# Patient Record
Sex: Female | Born: 1960 | Race: Black or African American | Hispanic: No | Marital: Single | Smoking: Never smoker
Health system: Southern US, Community
[De-identification: ages and names within clinical notes are randomized; demographics above are authoritative.]

## PROBLEM LIST (undated history)

## (undated) DIAGNOSIS — K7460 Unspecified cirrhosis of liver: Secondary | ICD-10-CM

## (undated) DIAGNOSIS — M79643 Pain in unspecified hand: Secondary | ICD-10-CM

## (undated) DIAGNOSIS — M549 Dorsalgia, unspecified: Secondary | ICD-10-CM

## (undated) DIAGNOSIS — M1990 Unspecified osteoarthritis, unspecified site: Secondary | ICD-10-CM

## (undated) DIAGNOSIS — F419 Anxiety disorder, unspecified: Secondary | ICD-10-CM

## (undated) DIAGNOSIS — E559 Vitamin D deficiency, unspecified: Secondary | ICD-10-CM

## (undated) DIAGNOSIS — E785 Hyperlipidemia, unspecified: Secondary | ICD-10-CM

## (undated) DIAGNOSIS — F988 Other specified behavioral and emotional disorders with onset usually occurring in childhood and adolescence: Secondary | ICD-10-CM

## (undated) DIAGNOSIS — K219 Gastro-esophageal reflux disease without esophagitis: Secondary | ICD-10-CM

## (undated) DIAGNOSIS — M2550 Pain in unspecified joint: Secondary | ICD-10-CM

## (undated) DIAGNOSIS — K769 Liver disease, unspecified: Secondary | ICD-10-CM

## (undated) DIAGNOSIS — B182 Chronic viral hepatitis C: Secondary | ICD-10-CM

## (undated) DIAGNOSIS — R5383 Other fatigue: Secondary | ICD-10-CM

## (undated) DIAGNOSIS — I10 Essential (primary) hypertension: Secondary | ICD-10-CM

## (undated) DIAGNOSIS — M79671 Pain in right foot: Secondary | ICD-10-CM

## (undated) DIAGNOSIS — F319 Bipolar disorder, unspecified: Secondary | ICD-10-CM

## (undated) DIAGNOSIS — F909 Attention-deficit hyperactivity disorder, unspecified type: Secondary | ICD-10-CM

## (undated) DIAGNOSIS — M109 Gout, unspecified: Secondary | ICD-10-CM

## (undated) DIAGNOSIS — K5730 Diverticulosis of large intestine without perforation or abscess without bleeding: Secondary | ICD-10-CM

## (undated) DIAGNOSIS — K279 Peptic ulcer, site unspecified, unspecified as acute or chronic, without hemorrhage or perforation: Secondary | ICD-10-CM

## (undated) DIAGNOSIS — M79672 Pain in left foot: Secondary | ICD-10-CM

## (undated) DIAGNOSIS — R0602 Shortness of breath: Secondary | ICD-10-CM

## (undated) HISTORY — DX: Diverticulosis of large intestine without perforation or abscess without bleeding: K57.30

## (undated) HISTORY — DX: Unspecified osteoarthritis, unspecified site: M19.90

## (undated) HISTORY — DX: Other specified behavioral and emotional disorders with onset usually occurring in childhood and adolescence: F98.8

## (undated) HISTORY — DX: Attention-deficit hyperactivity disorder, unspecified type: F90.9

## (undated) HISTORY — PX: BACK SURGERY: SHX140

## (undated) HISTORY — DX: Pain in unspecified hand: M79.643

## (undated) HISTORY — DX: Hyperlipidemia, unspecified: E78.5

## (undated) HISTORY — DX: Shortness of breath: R06.02

## (undated) HISTORY — DX: Peptic ulcer, site unspecified, unspecified as acute or chronic, without hemorrhage or perforation: K27.9

## (undated) HISTORY — DX: Liver disease, unspecified: K76.9

## (undated) HISTORY — DX: Pain in right foot: M79.671

## (undated) HISTORY — DX: Unspecified cirrhosis of liver: K74.60

## (undated) HISTORY — DX: Essential (primary) hypertension: I10

## (undated) HISTORY — DX: Vitamin D deficiency, unspecified: E55.9

## (undated) HISTORY — DX: Pain in unspecified joint: M25.50

## (undated) HISTORY — DX: Dorsalgia, unspecified: M54.9

## (undated) HISTORY — DX: Other fatigue: R53.83

## (undated) HISTORY — DX: Chronic viral hepatitis C: B18.2

## (undated) HISTORY — DX: Pain in left foot: M79.672

## (undated) HISTORY — DX: Bipolar disorder, unspecified: F31.9

---
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
## 2003-01-07 ENCOUNTER — Encounter: Payer: Self-pay | Admitting: *Deleted

## 2003-01-07 ENCOUNTER — Emergency Department (HOSPITAL_COMMUNITY): Admission: EM | Admit: 2003-01-07 | Discharge: 2003-01-07 | Payer: Self-pay | Admitting: *Deleted

## 2003-06-14 ENCOUNTER — Emergency Department (HOSPITAL_COMMUNITY): Admission: EM | Admit: 2003-06-14 | Discharge: 2003-06-14 | Payer: Self-pay | Admitting: Emergency Medicine

## 2003-12-05 ENCOUNTER — Emergency Department (HOSPITAL_COMMUNITY): Admission: EM | Admit: 2003-12-05 | Discharge: 2003-12-05 | Payer: Self-pay | Admitting: Emergency Medicine

## 2004-06-04 ENCOUNTER — Emergency Department (HOSPITAL_COMMUNITY): Admission: EM | Admit: 2004-06-04 | Discharge: 2004-06-04 | Payer: Self-pay | Admitting: Emergency Medicine

## 2007-03-31 ENCOUNTER — Emergency Department (HOSPITAL_COMMUNITY): Admission: EM | Admit: 2007-03-31 | Discharge: 2007-03-31 | Payer: Self-pay | Admitting: Family Medicine

## 2007-04-04 ENCOUNTER — Ambulatory Visit: Payer: Self-pay | Admitting: *Deleted

## 2007-11-05 ENCOUNTER — Emergency Department (HOSPITAL_COMMUNITY): Admission: EM | Admit: 2007-11-05 | Discharge: 2007-11-05 | Payer: Self-pay | Admitting: Emergency Medicine

## 2007-12-08 ENCOUNTER — Emergency Department (HOSPITAL_COMMUNITY): Admission: EM | Admit: 2007-12-08 | Discharge: 2007-12-08 | Payer: Self-pay | Admitting: Emergency Medicine

## 2009-12-08 ENCOUNTER — Emergency Department (HOSPITAL_COMMUNITY): Admission: EM | Admit: 2009-12-08 | Discharge: 2009-12-08 | Payer: Self-pay | Admitting: Emergency Medicine

## 2010-09-07 ENCOUNTER — Emergency Department (HOSPITAL_COMMUNITY)
Admission: EM | Admit: 2010-09-07 | Discharge: 2010-09-07 | Disposition: A | Discharge status: Home / Self Care | Payer: Self-pay | Attending: Emergency Medicine | Admitting: Emergency Medicine

## 2010-09-07 ENCOUNTER — Emergency Department (HOSPITAL_COMMUNITY): Payer: Self-pay

## 2010-09-07 DIAGNOSIS — S81009A Unspecified open wound, unspecified knee, initial encounter: Secondary | ICD-10-CM | POA: Insufficient documentation

## 2010-09-07 DIAGNOSIS — W540XXA Bitten by dog, initial encounter: Secondary | ICD-10-CM | POA: Insufficient documentation

## 2010-09-07 DIAGNOSIS — S51009A Unspecified open wound of unspecified elbow, initial encounter: Secondary | ICD-10-CM | POA: Insufficient documentation

## 2011-03-26 ENCOUNTER — Emergency Department (HOSPITAL_COMMUNITY)
Admission: EM | Admit: 2011-03-26 | Discharge: 2011-03-26 | Disposition: A | Discharge status: Home / Self Care | Payer: Self-pay | Attending: Emergency Medicine | Admitting: Emergency Medicine

## 2011-03-26 DIAGNOSIS — K089 Disorder of teeth and supporting structures, unspecified: Secondary | ICD-10-CM | POA: Insufficient documentation

## 2011-03-26 DIAGNOSIS — K029 Dental caries, unspecified: Secondary | ICD-10-CM | POA: Insufficient documentation

## 2011-03-28 ENCOUNTER — Emergency Department (HOSPITAL_COMMUNITY)
Admission: EM | Admit: 2011-03-28 | Discharge: 2011-03-28 | Disposition: A | Discharge status: Home / Self Care | Payer: Self-pay | Attending: Emergency Medicine | Admitting: Emergency Medicine

## 2011-03-28 ENCOUNTER — Encounter: Payer: Self-pay | Admitting: Emergency Medicine

## 2011-03-28 DIAGNOSIS — L988 Other specified disorders of the skin and subcutaneous tissue: Secondary | ICD-10-CM | POA: Insufficient documentation

## 2011-03-28 DIAGNOSIS — R220 Localized swelling, mass and lump, head: Secondary | ICD-10-CM | POA: Insufficient documentation

## 2011-03-28 DIAGNOSIS — K029 Dental caries, unspecified: Secondary | ICD-10-CM | POA: Insufficient documentation

## 2011-03-28 DIAGNOSIS — K089 Disorder of teeth and supporting structures, unspecified: Secondary | ICD-10-CM | POA: Insufficient documentation

## 2011-03-28 DIAGNOSIS — K047 Periapical abscess without sinus: Secondary | ICD-10-CM | POA: Insufficient documentation

## 2011-03-28 MED ORDER — IBUPROFEN 800 MG PO TABS: 800.0000 mg | ORAL_TABLET | Freq: Three times a day (TID) | ORAL | Status: AC

## 2011-03-28 MED ORDER — OXYCODONE-ACETAMINOPHEN 5-325 MG PO TABS
1.0000 | ORAL_TABLET | Freq: Once | ORAL | Status: AC
  Administered 2011-03-28: 1 via ORAL
  Filled 2011-03-28: qty 1

## 2011-03-28 MED ORDER — OXYCODONE-ACETAMINOPHEN 5-325 MG PO TABS: 2.0000 | ORAL_TABLET | ORAL | Status: AC | PRN

## 2011-03-28 MED ORDER — CLINDAMYCIN HCL 150 MG PO CAPS
450.0000 mg | ORAL_CAPSULE | Freq: Once | ORAL | Status: AC
  Administered 2011-03-28: 450 mg via ORAL
  Filled 2011-03-28: qty 3

## 2011-03-28 MED ORDER — CLINDAMYCIN HCL 150 MG PO CAPS: 300.0000 mg | ORAL_CAPSULE | Freq: Three times a day (TID) | ORAL | Status: AC

## 2011-03-28 MED ORDER — BUPIVACAINE HCL (PF) 0.5 % IJ SOLN
10.0000 mL | Freq: Once | INTRAMUSCULAR | Status: AC
  Administered 2011-03-28: 10 mL
  Filled 2011-03-28: qty 10

## 2011-03-28 NOTE — ED Provider Notes (Signed)
Medical screening examination/treatment/procedure(s) were performed by non-physician practitioner and as supervising physician I was immediately available for consultation/collaboration.  Nicholes Stairs, MD 03/28/11 [phone]

## 2011-03-28 NOTE — ED Notes (Signed)
Patient is resting comfortably. 

## 2011-03-28 NOTE — ED Notes (Signed)
Family at bedside. 

## 2011-03-28 NOTE — ED Notes (Signed)
Pt. Stated, tooth abscess for 2 months and just got worse

## 2011-03-28 NOTE — ED Provider Notes (Signed)
History     CSN: [id number] Arrival date & time: 03/28/2011  7:55 AM   First MD Initiated Contact with Patient 03/28/11 0756      No chief complaint on file.    Patient is a 50 y.o. female presenting with tooth pain. The history is provided by the patient.  Dental PainThe primary symptoms include mouth pain. Primary symptoms do not include dental injury, oral bleeding, oral lesions, headaches, fever, shortness of breath or sore throat. The symptoms began 3 to 5 days ago. The symptoms are worsening. The symptoms are recurrent. The symptoms occur constantly.  Additional symptoms include: dental sensitivity to temperature, gum swelling, gum tenderness, jaw pain and facial swelling. Additional symptoms do not include: purulent gums, trismus, trouble swallowing, dry mouth, taste disturbance, drooling, ear pain and swollen glands. Medical issues include: alcohol problem and periodontal disease.  She was seen here on 11/2 for the same; she was rxed PCN and analgesics. She has been taking them as instructed. The pain is no better and she has noted an abscess in the gums as well as swelling to the cheek. She denies trismus, trouble swallowing, trouble breathing.  No past medical history on file.  No past surgical history on file.  No family history on file.  History  Substance Use Topics  . Smoking status: Not on file  . Smokeless tobacco: Not on file  . Alcohol Use: Not on file    OB History    No data available      Review of Systems  Constitutional: Negative.  Negative for fever.  HENT: Positive for facial swelling. Negative for ear pain, sore throat, drooling, trouble swallowing and neck pain.   Respiratory: Negative.  Negative for shortness of breath.   Cardiovascular: Negative.   Skin: Positive for color change.  Neurological: Negative.  Negative for dizziness, speech difficulty, numbness and headaches.    Allergies  Penicillins  Home Medications  No current outpatient  prescriptions on file.  BP 147/95  Pulse 69  Temp(Src) 99.9 F (37.7 C) (Oral)  SpO2 98%  Physical Exam  Constitutional: She is oriented to person, place, and time. She appears well-developed and well-nourished. No distress.  HENT:  Head: Normocephalic. No trismus in the jaw.  Mouth/Throat: Uvula is midline, oropharynx is clear and moist and mucous membranes are normal. Abnormal dentition. Dental abscesses and dental caries present. No uvula swelling. No oropharyngeal exudate.         Swelling of L cheek, eyelid  Eyes: Conjunctivae and EOM are normal. Pupils are equal, round, and reactive to light.  Neck: Normal range of motion. Neck supple. No tracheal deviation present. No thyromegaly present.  Cardiovascular: Normal rate and regular rhythm.   Pulmonary/Chest: Effort normal and breath sounds normal. No stridor.  Lymphadenopathy:    She has no cervical adenopathy.  Neurological: She is alert and oriented to person, place, and time. No cranial nerve deficit.  Skin: Skin is warm and dry. No rash noted. She is not diaphoretic.  Psychiatric: She has a normal mood and affect.    ED Course  INCISION AND DRAINAGE Date/Time: 03/28/2011 9:00 AM Performed by: Grant Fontana Authorized by: Grant Fontana Consent: Verbal consent obtained. Risks and benefits: risks, benefits and alternatives were discussed Consent given by: patient Patient identity confirmed: verbally with patient and arm band Time out: Immediately prior to procedure a "time out" was called to verify the correct patient, procedure, equipment, support staff and site/side marked as required. Type: abscess Body area:  mouth Location details: palate Anesthesia: local infiltration Local anesthetic: bupivacaine 0.5% without epinephrine Anesthetic total: 2 ml Patient sedated: no Risk factor: coagulopathy, underlying major nerve and underlying major vessel Scalpel size: 11 Needle gauge: 18  Attempt was made to  infiltrate the area with Marcaine and needle aspirate the area with 18ga needle, but no purulent drainage was produced. Labs Reviewed - No data to display No results found.   1. Dental abscess       MDM  We discussed having the tooth pulled; patient stated that she was trying to wait for the free clinic that's coming up in 2 weeks. I told her that given her facial swelling, it's very important for her to get the tooth pulled and follow up with a dentist in the next day or 2. She was given a handout of free/low cost dental services in the area and instructed on followup. She verbalized understanding and agreed to plan.        Grant Fontana, [place] 03/28/11 [phone]

## 2011-03-31 NOTE — ED Provider Notes (Signed)
Medical screening examination/treatment/procedure(s) were performed by non-physician practitioner and as supervising physician I was immediately available for consultation/collaboration.  Nicholes Stairs, MD 03/31/11 1537

## 2013-03-02 ENCOUNTER — Ambulatory Visit (HOSPITAL_COMMUNITY)
Admission: RE | Admit: 2013-03-02 | Discharge: 2013-03-02 | Disposition: A | Discharge status: Home / Self Care | Payer: Self-pay | Source: Ambulatory Visit | Attending: Nurse Practitioner | Admitting: Nurse Practitioner

## 2013-03-02 ENCOUNTER — Other Ambulatory Visit (HOSPITAL_COMMUNITY): Payer: Self-pay | Admitting: Nurse Practitioner

## 2013-03-02 DIAGNOSIS — M5137 Other intervertebral disc degeneration, lumbosacral region: Secondary | ICD-10-CM | POA: Insufficient documentation

## 2013-03-02 DIAGNOSIS — M4310 Spondylolisthesis, site unspecified: Secondary | ICD-10-CM | POA: Insufficient documentation

## 2013-03-02 DIAGNOSIS — R52 Pain, unspecified: Secondary | ICD-10-CM

## 2013-03-02 DIAGNOSIS — M51379 Other intervertebral disc degeneration, lumbosacral region without mention of lumbar back pain or lower extremity pain: Secondary | ICD-10-CM | POA: Insufficient documentation

## 2013-09-21 ENCOUNTER — Ambulatory Visit: Payer: No Typology Code available for payment source | Attending: Internal Medicine | Admitting: Internal Medicine

## 2013-09-21 ENCOUNTER — Encounter: Payer: Self-pay | Admitting: Internal Medicine

## 2013-09-21 ENCOUNTER — Emergency Department (HOSPITAL_COMMUNITY)
Admission: EM | Admit: 2013-09-21 | Discharge: 2013-09-21 | Disposition: A | Discharge status: Home / Self Care | Payer: No Typology Code available for payment source | Source: Home / Self Care

## 2013-09-21 ENCOUNTER — Encounter (HOSPITAL_COMMUNITY): Payer: Self-pay | Admitting: Emergency Medicine

## 2013-09-21 VITALS — BP 174/79 | HR 66 | Temp 98.7°F | Resp 18 | Ht 61.0 in | Wt 148.0 lb

## 2013-09-21 DIAGNOSIS — K089 Disorder of teeth and supporting structures, unspecified: Secondary | ICD-10-CM

## 2013-09-21 DIAGNOSIS — G8929 Other chronic pain: Secondary | ICD-10-CM | POA: Insufficient documentation

## 2013-09-21 DIAGNOSIS — Z7189 Other specified counseling: Secondary | ICD-10-CM

## 2013-09-21 DIAGNOSIS — K0889 Other specified disorders of teeth and supporting structures: Secondary | ICD-10-CM

## 2013-09-21 DIAGNOSIS — M5417 Radiculopathy, lumbosacral region: Secondary | ICD-10-CM

## 2013-09-21 DIAGNOSIS — R29898 Other symptoms and signs involving the musculoskeletal system: Secondary | ICD-10-CM

## 2013-09-21 DIAGNOSIS — M4310 Spondylolisthesis, site unspecified: Secondary | ICD-10-CM

## 2013-09-21 DIAGNOSIS — I10 Essential (primary) hypertension: Secondary | ICD-10-CM | POA: Insufficient documentation

## 2013-09-21 DIAGNOSIS — Q762 Congenital spondylolisthesis: Secondary | ICD-10-CM

## 2013-09-21 DIAGNOSIS — IMO0002 Reserved for concepts with insufficient information to code with codable children: Secondary | ICD-10-CM | POA: Insufficient documentation

## 2013-09-21 DIAGNOSIS — M539 Dorsopathy, unspecified: Secondary | ICD-10-CM

## 2013-09-21 DIAGNOSIS — M545 Low back pain, unspecified: Secondary | ICD-10-CM

## 2013-09-21 DIAGNOSIS — F1010 Alcohol abuse, uncomplicated: Secondary | ICD-10-CM | POA: Insufficient documentation

## 2013-09-21 DIAGNOSIS — M6281 Muscle weakness (generalized): Secondary | ICD-10-CM

## 2013-09-21 DIAGNOSIS — Z79899 Other long term (current) drug therapy: Secondary | ICD-10-CM | POA: Insufficient documentation

## 2013-09-21 DIAGNOSIS — F17200 Nicotine dependence, unspecified, uncomplicated: Secondary | ICD-10-CM

## 2013-09-21 DIAGNOSIS — Z880 Allergy status to penicillin: Secondary | ICD-10-CM | POA: Insufficient documentation

## 2013-09-21 DIAGNOSIS — Z791 Long term (current) use of non-steroidal anti-inflammatories (NSAID): Secondary | ICD-10-CM | POA: Insufficient documentation

## 2013-09-21 DIAGNOSIS — Z7689 Persons encountering health services in other specified circumstances: Secondary | ICD-10-CM

## 2013-09-21 MED ORDER — CEPHALEXIN 500 MG PO CAPS: 500.0000 mg | ORAL_CAPSULE | Freq: Four times a day (QID) | ORAL | Status: DC

## 2013-09-21 MED ORDER — METHYLPREDNISOLONE 4 MG PO KIT: PACK | ORAL | Status: DC

## 2013-09-21 MED ORDER — HYDROCODONE-ACETAMINOPHEN 7.5-325 MG PO TABS: 1.0000 | ORAL_TABLET | ORAL | Status: DC | PRN

## 2013-09-21 NOTE — Discharge Instructions (Signed)
Chronic Back Pain  When back pain lasts longer than 3 months, it is called chronic back pain.People with chronic back pain often go through certain periods that are more intense (flare-ups).  CAUSES Chronic back pain can be caused by wear and tear (degeneration) on different structures in your back. These structures include:  The bones of your spine (vertebrae) and the joints surrounding your spinal cord and nerve roots (facets).  The strong, fibrous tissues that connect your vertebrae (ligaments). Degeneration of these structures may result in pressure on your nerves. This can lead to constant pain. HOME CARE INSTRUCTIONS  Avoid bending, heavy lifting, prolonged sitting, and activities which make the problem worse.  Take brief periods of rest throughout the day to reduce your pain. Lying down or standing usually is better than sitting while you are resting.  Take over-the-counter or prescription medicines only as directed by your caregiver. SEEK IMMEDIATE MEDICAL CARE IF:   You have weakness or numbness in one of your legs or feet.  You have trouble controlling your bladder or bowels.  You have nausea, vomiting, abdominal pain, shortness of breath, or fainting. Document Released: 06/17/2004 Document Revised: 08/02/2011 Document Reviewed: 04/24/2011 [facility] Patient Information 2014 [place]Care, [place].  Dental Pain Toothache is pain in or around a tooth. It may get worse with chewing or with cold or heat.  HOME CARE  Your dentist may use a numbing medicine during treatment. If so, you may need to avoid eating until the medicine wears off. Ask your dentist about this.  Only take medicine as told by your dentist or doctor.  Avoid chewing food near the painful tooth until after all treatment is done. Ask your dentist about this. GET HELP RIGHT AWAY IF:   The problem gets worse or new problems appear.  You have a fever.  There is redness and puffiness (swelling) of the face, jaw,  or neck.  You cannot open your mouth.  There is pain in the jaw.  There is very bad pain that is not helped by medicine. MAKE SURE YOU:   Understand these instructions.  Will watch your condition.  Will get help right away if you are not doing well or get worse. Document Released: 10/27/2007 Document Revised: 08/02/2011 Document Reviewed: 10/27/2007 [facility] Patient Information 2014 [place]Care, [place].  Lumbosacral Radiculopathy Lumbosacral radiculopathy is a pinched nerve or nerves in the low back (lumbosacral area). When this happens you may have weakness in your legs and may not be able to stand on your toes. You may have pain going down into your legs. There may be difficulties with walking normally. There are many causes of this problem. Sometimes this may happen from an injury, or simply from arthritis or boney problems. It may also be caused by other illnesses such as diabetes. If there is no improvement after treatment, further studies may be done to find the exact cause. DIAGNOSIS  X-rays may be needed if the problems become long standing. Electromyograms may be done. This study is one in which the working of nerves and muscles is studied. HOME CARE INSTRUCTIONS   Applications of ice packs may be helpful. Ice can be used in a plastic bag with a towel around it to prevent frostbite to skin. This may be used every 2 hours for 20 to 30 minutes, or as needed, while awake, or as directed by your caregiver.  Only take over-the-counter or prescription medicines for pain, discomfort, or fever as directed by your caregiver.  If physical therapy  was prescribed, follow your caregiver's directions. SEEK IMMEDIATE MEDICAL CARE IF:   You have pain not controlled with medications.  You seem to be getting worse rather than better.  You develop increasing weakness in your legs.  You develop loss of bowel or bladder control.  You have difficulty with walking or balance, or develop  clumsiness in the use of your legs.  You have a fever. MAKE SURE YOU:   Understand these instructions.  Will watch your condition.  Will get help right away if you are not doing well or get worse. Document Released: 05/10/2005 Document Revised: 08/02/2011 Document Reviewed: 12/29/2007 [facility] Patient Information 2014 [place]ExitCare, [place].  Radicular Pain Radicular pain in either the arm or leg is usually from a bulging or herniated disk in the spine. A piece of the herniated disk may press against the nerves as the nerves exit the spine. This causes pain which is felt at the tips of the nerves down the arm or leg. Other causes of radicular pain may include:  Fractures.  Heart disease.  Cancer.  An abnormal and usually degenerative state of the nervous system or nerves (neuropathy). Diagnosis may require CT or MRI scanning to determine the primary cause.  Nerves that start at the neck (nerve roots) may cause radicular pain in the outer shoulder and arm. It can spread down to the thumb and fingers. The symptoms vary depending on which nerve root has been affected. In most cases radicular pain improves with conservative treatment. Neck problems may require physical therapy, a neck collar, or cervical traction. Treatment may take many weeks, and surgery may be considered if the symptoms do not improve.  Conservative treatment is also recommended for sciatica. Sciatica causes pain to radiate from the lower back or buttock area down the leg into the foot. Often there is a history of back problems. Most patients with sciatica are better after 2 to 4 weeks of rest and other supportive care. Short term bed rest can reduce the disk pressure considerably. Sitting, however, is not a good position since this increases the pressure on the disk. You should avoid bending, lifting, and all other activities which make the problem worse. Traction can be used in severe cases. Surgery is usually reserved for  patients who do not improve within the first months of treatment. Only take over-the-counter or prescription medicines for pain, discomfort, or fever as directed by your caregiver. Narcotics and muscle relaxants may help by relieving more severe pain and spasm and by providing mild sedation. Cold or massage can give significant relief. Spinal manipulation is not recommended. It can increase the degree of disc protrusion. Epidural steroid injections are often effective treatment for radicular pain. These injections deliver medicine to the spinal nerve in the space between the protective covering of the spinal cord and back bones (vertebrae). Your caregiver can give you more information about steroid injections. These injections are most effective when given within two weeks of the onset of pain.  You should see your caregiver for follow up care as recommended. A program for neck and back injury rehabilitation with stretching and strengthening exercises is an important part of management.  SEEK IMMEDIATE MEDICAL CARE IF:  You develop increased pain, weakness, or numbness in your arm or leg.  You develop difficulty with bladder or bowel control.  You develop abdominal pain. Document Released: 06/17/2004 Document Revised: 08/02/2011 Document Reviewed: 09/02/2008 [facility]ExitCare Patient Information 2014 [place]ExitCare, [place]LLC.

## 2013-09-21 NOTE — ED Provider Notes (Signed)
CSN: [id number]     Arrival date & time 09/21/13  1040 History   First MD Initiated Contact with Patient 09/21/13 1116     Chief Complaint  Patient presents with  . Arm Pain  . Leg Pain   (Consider location/radiation/quality/duration/timing/severity/associated sxs/prior Treatment) HPI Comments: 53 year old female complaining of chronic low back pain with radiculopathy to the right lower extremity. This results from a L. 4/5 and currently states his. There is pain radiating to the right posterior hip and leg described as sharp and exacerbated by certain movements. Denies recent trauma. St was told several yrs ago she needed surgery. Her Xray taken last yr confirms her lumbar anterolisthesis and severe DDD.  Her second complaint is related to a right long finger with limited range of motion and strength due to a laceration of the web space. This occurred approximately one month ago in which she was intoxicated on her birthday. She did not seek medical treatment until today.  Surgical complaint is that of acute on chronic tooth pain in the left upper pre-and third molars. She is requesting a prescription for Keflex refill.   History reviewed. No pertinent past medical history. History reviewed. No pertinent past surgical history. History reviewed. No pertinent family history. History  Substance Use Topics  . Smoking status: Current Every Day Smoker  . Smokeless tobacco: Not on file  . Alcohol Use: 0.6 oz/week    1 Shots of liquor per week   OB History   Grav Para Term Preterm Abortions TAB SAB Ect Mult Living                 Review of Systems  Constitutional: Positive for activity change. Negative for fever and appetite change.  HENT: Positive for dental problem.   Respiratory: Negative.   Gastrointestinal: Negative.   Genitourinary: Negative.   Musculoskeletal: Positive for back pain. Negative for gait problem, joint swelling and neck pain.  Skin: Negative.   Neurological:  Negative for dizziness, seizures, syncope, speech difficulty, light-headedness and headaches.    Allergies  Penicillins  Home Medications   Prior to Admission medications   Medication Sig Start Date End Date Taking? Authorizing Provider  LISINOPRIL PO Take by mouth.   Yes Historical Provider, MD  ibuprofen (ADVIL,MOTRIN) 800 MG tablet Take 800 mg by mouth 3 (three) times daily with meals.      Historical Provider, MD  oxyCODONE-acetaminophen (PERCOCET) 5-325 MG per tablet Take 2 tablets by mouth every 3 (three) hours as needed. For pain.     Historical Provider, MD  penicillin v potassium (VEETID) 500 MG tablet Take 500 mg by mouth 3 (three) times daily. Pt started on Friday 11/3. Pt has only had 1 on 11/4. For 10 days.     Historical Provider, MD   BP 181/96  Pulse 70  Temp(Src) 98.4 F (36.9 C) (Oral)  Resp 16  SpO2 98%  LMP 08/22/2013 Physical Exam  Nursing note and vitals reviewed. Constitutional: She is oriented to person, place, and time. She appears well-developed and well-nourished. No distress.  Eyes: Conjunctivae and EOM are normal.  Neck: Normal range of motion. Neck supple.  Cardiovascular: Normal rate.   Pulmonary/Chest: Effort normal. No respiratory distress.  Musculoskeletal: She exhibits no edema.  Tenderness to the lower lumbar spine.   R Hand with weak flexion of middle digit and tenderness along the associated tendon. Decrease sensation to light touch of the long finger. Old laceration is healed. No signs of infection. No swelling or erythema.  Neurological: She is alert and oriented to person, place, and time. She exhibits normal muscle tone.  Skin: Skin is warm and dry. No rash noted.  Psychiatric: She has a normal mood and affect.    ED Course  Procedures (including critical care time) Labs Review Labs Reviewed - No data to display  Imaging Review No results found.   MDM   1. Acute low back pain due to spinal disorder   2. Anterolisthesis   3.  Lumbosacral radiculopathy at L4   4. Right hand weakness   5. Late effect of tendon injury   6. Toothache      norco 7.5 mg #15 Medrol dose pack Keflex Must f/u with PCP, hand surgeon and back specialist Pt made aware we cant tx chronic pain or the etiologies that will require specialist as above.      Hayden Rasmussenavid Laquana Villari, NP 09/21/13 1228

## 2013-09-21 NOTE — ED Notes (Signed)
C/o  Right leg pain.  Sharp pain the comes and goes usually last 5 to 10 min.   Also having weakness and pain in right hand.  Unable to grip or open drinks.  Having no relief with prescribed Rx.  Hx of sciatica.

## 2013-09-21 NOTE — Progress Notes (Signed)
Patient ID: Rachel Hudson, female   DOB: 11/08/1960, 53 y.o.   MRN: [id number]  CC: establish care, chronic back pain, HTN   HPI:  Patient presents today with complaints of chronic back pain.  Patient states that she was seen at Urgent care earlier today and wanted to come here to see if we could give her different care.  Patient wants a referral to orthopedics or surgeon to help with the chronic back pain.  She states she has had this same pain for the past few years.  The pain is relieved by nothing and aggravated by prolonged sitting and standing.  Patient reports shooting pain up her hip and back with twisting motions. She denies any bowel or bladder dysfunction. Patient reports that she often gets headaches related to high blood pressure and admits that she sometimes skips her medication because she wants to drink alcohol. Patient states that she has not consumed any alcohol in the past three weeks.    Allergies  Allergen Reactions  . Penicillins     Facial swelling   History reviewed. No pertinent past medical history. Current Outpatient Prescriptions on File Prior to Visit  Medication Sig Dispense Refill  . cephALEXin (KEFLEX) 500 MG capsule Take 1 capsule (500 mg total) by mouth 4 (four) times daily.  28 capsule  0  . HYDROcodone-acetaminophen (NORCO) 7.5-325 MG per tablet Take 1 tablet by mouth every 4 (four) hours as needed.  15 tablet  0  . ibuprofen (ADVIL,MOTRIN) 800 MG tablet Take 800 mg by mouth 3 (three) times daily with meals.        Marland Kitchen. LISINOPRIL PO Take by mouth.      . methylPREDNISolone (MEDROL DOSEPAK) 4 MG tablet follow package directions  21 tablet  0  . penicillin v potassium (VEETID) 500 MG tablet Take 500 mg by mouth 3 (three) times daily. Pt started on Friday 11/3. Pt has only had 1 on 11/4. For 10 days.        No current facility-administered medications on file prior to visit.   History reviewed. No pertinent family history. History   Social History  . Marital  Status: Single    Spouse Name: N/A    Number of Children: N/A  . Years of Education: N/A   Occupational History  . Not on file.   Social History Main Topics  . Smoking status: Current Every Day Smoker  . Smokeless tobacco: Not on file  . Alcohol Use: 0.6 oz/week    1 Shots of liquor per week  . Drug Use: 3.00 per week    Special: Marijuana  . Sexual Activity: Yes    Birth Control/ Protection: None   Other Topics Concern  . Not on file   Social History Narrative  . No narrative on file    Review of Systems: Constitutional: Negative for fever, chills, diaphoresis, activity change, appetite change and fatigue. HENT: Negative for ear pain, nosebleeds, congestion, facial swelling, rhinorrhea, neck pain, neck stiffness and ear discharge.  Eyes: Negative for pain, discharge, redness, itching and visual disturbance. Respiratory: Negative for cough, choking, chest tightness, shortness of breath, wheezing and stridor.  Cardiovascular: Negative for chest pain, palpitations and leg swelling. Gastrointestinal: Negative for abdominal distention. Genitourinary: Negative for dysuria, urgency, frequency, hematuria, flank pain, decreased urine volume, difficulty urinating and dyspareunia.  Musculoskeletal: Negative for joint swelling, arthralgias and gait problem. Positive for chronic back pain.  Neurological: Negative for dizziness, tremors, seizures, syncope, facial asymmetry, speech difficulty, weakness, light-headedness,  numbness. Positive for headaches.  Hematological: Negative for adenopathy. Does not bruise/bleed easily. Psychiatric/Behavioral: Negative for hallucinations, behavioral problems, confusion, dysphoric mood, decreased concentration and agitation.    Objective:   Filed Vitals:   09/21/13 1654  BP: 174/79  Pulse: 66  Temp: 98.7 F (37.1 C)  Resp: 18    Physical Exam: Constitutional: Patient appears well-developed and well-nourished. No distress. HENT: Normocephalic,  atraumatic, External right and left ear normal. Oropharynx is clear and moist.  Eyes: Conjunctivae and EOM are normal. PERRLA, no scleral icterus. Neck: Normal ROM. Neck supple. No JVD. No tracheal deviation. No thyromegaly. CVS: RRR, S1/S2 +, no murmurs, no gallops, no carotid bruit.  Pulmonary: Effort and breath sounds normal, no stridor, rhonchi, wheezes, rales.  Abdominal: Soft. BS +,  no distension, tenderness, rebound or guarding.  Musculoskeletal: Positive right straight leg raise. Tenderness to palpitation of lumbar spine. No edema. Lumbar pain elicited with ROM. Lymphadenopathy: No lymphadenopathy noted, cervical, Neuro: Alert. Normal reflexes, muscle tone coordination. No cranial nerve deficit. Skin: Skin is warm and dry. No rash noted. Not diaphoretic. No erythema. No pallor. Psychiatric: Normal mood and affect. Behavior, judgment, thought content normal.  No results found for this basename: WBC, HGB, HCT, MCV, PLT   No results found for this basename: CREATININE, BUN, NA, K, CL, CO2    No results found for this basename: HGBA1C   Lipid Panel  No results found for this basename: chol, trig, hdl, cholhdl, vldl, ldlcalc       Assessment and plan:   Rachel Hudson was seen today for establish care, hypertension and back pain.  Diagnoses and associated orders for this visit:  Lumbosacral radiculopathy at L4 - Ambulatory referral to Pain Clinic - Ambulatory referral to Orthopedic Surgery Continue medication regimen prescribed by Urgent care (vicodin and medrol dose pack)  HTN (hypertension) Follow up in one week to evaluate BP before making changes.   Smoking Patient reports that she is not ready to quit but will consider cutting back. Smoking cessation discussed with patient and how it affects cardiovascular health  Alcohol abuse Explained effects of etoh on BP. Discussed cutting back.   Return in about 1 week (around 09/28/2013), or if symptoms worsen or fail to improve,  for Nurse Visit-BP check, Lab Visit.   Holland CommonsValerie Dinesha Twiggs, NP-C [facility] [phone] 09/21/2013, 7:12 PM

## 2013-09-21 NOTE — Progress Notes (Signed)
Pt here to establish care for uncontrolled hypertension and Chronic right lumbar pain radiating down to foot. Pt had multiple imaging done with pain management Pt was just seen in Urgent care with prescribed Steroid dose pack and Hydrocodone BP elevated 174/89 66 slight headache noted- states she took bp med this am

## 2013-09-21 NOTE — Patient Instructions (Signed)
Sciatica Sciatica is pain, weakness, numbness, or tingling along the path of the sciatic nerve. The nerve starts in the lower back and runs down the back of each leg. The nerve controls the muscles in the lower leg and in the back of the knee, while also providing sensation to the back of the thigh, lower leg, and the sole of your foot. Sciatica is a symptom of another medical condition. For instance, nerve damage or certain conditions, such as a herniated disk or bone spur on the spine, pinch or put pressure on the sciatic nerve. This causes the pain, weakness, or other sensations normally associated with sciatica. Generally, sciatica only affects one side of the body. CAUSES   Herniated or slipped disc.  Degenerative disk disease.  A pain disorder involving the narrow muscle in the buttocks (piriformis syndrome).  Pelvic injury or fracture.  Pregnancy.  Tumor (rare). SYMPTOMS  Symptoms can vary from mild to very severe. The symptoms usually travel from the low back to the buttocks and down the back of the leg. Symptoms can include:  Mild tingling or dull aches in the lower back, leg, or hip.  Numbness in the back of the calf or sole of the foot.  Burning sensations in the lower back, leg, or hip.  Sharp pains in the lower back, leg, or hip.  Leg weakness.  Severe back pain inhibiting movement. These symptoms may get worse with coughing, sneezing, laughing, or prolonged sitting or standing. Also, being overweight may worsen symptoms. DIAGNOSIS  Your caregiver will perform a physical exam to look for common symptoms of sciatica. He or she may ask you to do certain movements or activities that would trigger sciatic nerve pain. Other tests may be performed to find the cause of the sciatica. These may include:  Blood tests.  X-rays.  Imaging tests, such as an MRI or CT scan. TREATMENT  Treatment is directed at the cause of the sciatic pain. Sometimes, treatment is not necessary  and the pain and discomfort goes away on its own. If treatment is needed, your caregiver may suggest:  Over-the-counter medicines to relieve pain.  Prescription medicines, such as anti-inflammatory medicine, muscle relaxants, or narcotics.  Applying heat or ice to the painful area.  Steroid injections to lessen pain, irritation, and inflammation around the nerve.  Reducing activity during periods of pain.  Exercising and stretching to strengthen your abdomen and improve flexibility of your spine. Your caregiver may suggest losing weight if the extra weight makes the back pain worse.  Physical therapy.  Surgery to eliminate what is pressing or pinching the nerve, such as a bone spur or part of a herniated disk. HOME CARE INSTRUCTIONS   Only take over-the-counter or prescription medicines for pain or discomfort as directed by your caregiver.  Apply ice to the affected area for 20 minutes, 3 4 times a day for the first 48 72 hours. Then try heat in the same way.  Exercise, stretch, or perform your usual activities if these do not aggravate your pain.  Attend physical therapy sessions as directed by your caregiver.  Keep all follow-up appointments as directed by your caregiver.  Do not wear high heels or shoes that do not provide proper support.  Check your mattress to see if it is too soft. A firm mattress may lessen your pain and discomfort. SEEK IMMEDIATE MEDICAL CARE IF:   You lose control of your bowel or bladder (incontinence).  You have increasing weakness in the lower back,   pelvis, buttocks, or legs.  You have redness or swelling of your back.  You have a burning sensation when you urinate.  You have pain that gets worse when you lie down or awakens you at night.  Your pain is worse than you have experienced in the past.  Your pain is lasting longer than 4 weeks.  You are suddenly losing weight without reason. MAKE SURE YOU:  Understand these  instructions.  Will watch your condition.  Will get help right away if you are not doing well or get worse. Document Released: 05/04/2001 Document Revised: 11/09/2011 Document Reviewed: 09/19/2011 [facility] Patient Information 2014 [place], [place]. Lumbosacral Radiculopathy Lumbosacral radiculopathy is a pinched nerve or nerves in the low back (lumbosacral area). When this happens you may have weakness in your legs and may not be able to stand on your toes. You may have pain going down into your legs. There may be difficulties with walking normally. There are many causes of this problem. Sometimes this may happen from an injury, or simply from arthritis or boney problems. It may also be caused by other illnesses such as diabetes. If there is no improvement after treatment, further studies may be done to find the exact cause. DIAGNOSIS  X-rays may be needed if the problems become long standing. Electromyograms may be done. This study is one in which the working of nerves and muscles is studied. HOME CARE INSTRUCTIONS   Applications of ice packs may be helpful. Ice can be used in a plastic bag with a towel around it to prevent frostbite to skin. This may be used every 2 hours for 20 to 30 minutes, or as needed, while awake, or as directed by your caregiver.  Only take over-the-counter or prescription medicines for pain, discomfort, or fever as directed by your caregiver.  If physical therapy was prescribed, follow your caregiver's directions. SEEK IMMEDIATE MEDICAL CARE IF:   You have pain not controlled with medications.  You seem to be getting worse rather than better.  You develop increasing weakness in your legs.  You develop loss of bowel or bladder control.  You have difficulty with walking or balance, or develop clumsiness in the use of your legs.  You have a fever. MAKE SURE YOU:   Understand these instructions.  Will watch your condition.  Will get help right away if you  are not doing well or get worse. Document Released: 05/10/2005 Document Revised: 08/02/2011 Document Reviewed: 12/29/2007 [facility] Patient Information 2014 [place], [place].

## 2013-09-23 NOTE — ED Provider Notes (Signed)
Medical screening examination/treatment/procedure(s) were performed by a resident physician or non-physician practitioner and as the supervising physician I was immediately available for consultation/collaboration.  Norine Reddington, MD    Pyper Olexa S Evia Goldsmith, MD 09/23/13 0842 

## 2013-09-28 ENCOUNTER — Ambulatory Visit: Payer: No Typology Code available for payment source | Attending: Internal Medicine | Admitting: *Deleted

## 2013-09-28 VITALS — BP 136/83 | HR 72 | Resp 16

## 2013-09-28 DIAGNOSIS — Z7689 Persons encountering health services in other specified circumstances: Secondary | ICD-10-CM

## 2013-09-28 LAB — CBC WITH DIFFERENTIAL/PLATELET
BASOS ABS: 0 10*3/uL (ref 0.0–0.1)
Basophils Relative: 0 % (ref 0–1)
Eosinophils Absolute: 0.1 10*3/uL (ref 0.0–0.7)
Eosinophils Relative: 1 % (ref 0–5)
HCT: 39 % (ref 36.0–46.0)
Hemoglobin: 13.6 g/dL (ref 12.0–15.0)
LYMPHS ABS: 3.6 10*3/uL (ref 0.7–4.0)
LYMPHS PCT: 36 % (ref 12–46)
MCH: 31.3 pg (ref 26.0–34.0)
MCHC: 34.9 g/dL (ref 30.0–36.0)
MCV: 89.9 fL (ref 78.0–100.0)
Monocytes Absolute: 0.7 10*3/uL (ref 0.1–1.0)
Monocytes Relative: 7 % (ref 3–12)
NEUTROS ABS: 5.5 10*3/uL (ref 1.7–7.7)
NEUTROS PCT: 56 % (ref 43–77)
PLATELETS: 161 10*3/uL (ref 150–400)
RBC: 4.34 MIL/uL (ref 3.87–5.11)
RDW: 14.3 % (ref 11.5–15.5)
WBC: 9.9 10*3/uL (ref 4.0–10.5)

## 2013-09-28 LAB — COMPLETE METABOLIC PANEL WITH GFR
ALT: 174 U/L — AB (ref 0–35)
AST: 132 U/L — AB (ref 0–37)
Albumin: 3.9 g/dL (ref 3.5–5.2)
Alkaline Phosphatase: 70 U/L (ref 39–117)
BUN: 16 mg/dL (ref 6–23)
CALCIUM: 9.4 mg/dL (ref 8.4–10.5)
CHLORIDE: 100 meq/L (ref 96–112)
CO2: 26 meq/L (ref 19–32)
Creat: 0.7 mg/dL (ref 0.50–1.10)
GFR, Est Non African American: >89 mL/min
Glucose, Bld: 110 mg/dL — ABNORMAL HIGH (ref 70–99)
Potassium: 3.6 mEq/L (ref 3.5–5.3)
SODIUM: 134 meq/L — AB (ref 135–145)
TOTAL PROTEIN: 7.5 g/dL (ref 6.0–8.3)
Total Bilirubin: 0.5 mg/dL (ref 0.2–1.2)

## 2013-09-28 LAB — LIPID PANEL
CHOL/HDL RATIO: 2.8 ratio
Cholesterol: 177 mg/dL (ref 0–200)
HDL: 64 mg/dL (ref >39)
LDL CALC: 88 mg/dL (ref 0–99)
TRIGLYCERIDES: 124 mg/dL (ref <150)
VLDL: 25 mg/dL (ref 0–40)

## 2013-09-28 LAB — HEMOGLOBIN A1C
Hgb A1c MFr Bld: 5.7 % — ABNORMAL HIGH (ref <5.7)
MEAN PLASMA GLUCOSE: 117 mg/dL — AB (ref <117)

## 2013-09-28 NOTE — Progress Notes (Signed)
Patient here for Blood Pressure recheck.

## 2013-09-29 LAB — TSH: TSH: 2.804 u[IU]/mL (ref 0.350–4.500)

## 2013-10-01 ENCOUNTER — Telehealth: Payer: Self-pay | Admitting: Emergency Medicine

## 2013-10-01 DIAGNOSIS — R945 Abnormal results of liver function studies: Secondary | ICD-10-CM

## 2013-10-01 DIAGNOSIS — R7989 Other specified abnormal findings of blood chemistry: Secondary | ICD-10-CM

## 2013-10-01 NOTE — Telephone Encounter (Signed)
Left message for pt to call to schedule lab visit with lab results

## 2013-10-01 NOTE — Telephone Encounter (Signed)
Message copied by Darlis LoanSMITH, JILL D on Mon Oct 01, 2013  3:59 PM ------      Message from: Holland CommonsKECK, VALERIE A      Created: Sun Sep 30, 2013  4:44 PM       Let patient know her liver function is elevated. She needs to avoid tylenol, NSAIDS, and alcohol use to avoid further damage. She needs to come in to get a Hepatitis panel drawn. You may place the order. Thanks. ------

## 2013-10-09 ENCOUNTER — Telehealth: Payer: Self-pay | Admitting: *Deleted

## 2013-10-09 NOTE — Telephone Encounter (Signed)
Left message

## 2013-10-09 NOTE — Telephone Encounter (Signed)
Message copied by Raynelle CharyWINFREE, Nirali Magouirk R on Tue Oct 09, 2013  4:00 PM ------      Message from: Holland CommonsKECK, VALERIE A      Created: Mon Oct 08, 2013 10:13 PM       Call patient and let her know her liver enzymes are elevated and she should avoid excess alcohol and tylenol. Make sure patient is not taking too many percocet's as to they have tylenol in them and can further damage kidneys. Thanks. ------

## 2013-10-10 ENCOUNTER — Telehealth: Payer: Self-pay | Admitting: *Deleted

## 2013-10-10 DIAGNOSIS — R748 Abnormal levels of other serum enzymes: Secondary | ICD-10-CM

## 2013-10-10 NOTE — Telephone Encounter (Signed)
Message copied by Fredderick SeveranceUCATTE, Lynette Topete L on Wed Oct 10, 2013 11:35 AM ------      Message from: Ambrose FinlandKECK, VALERIE A      Created: Sun Sep 30, 2013  4:44 PM       Let patient know her liver function is elevated. She needs to avoid tylenol, NSAIDS, and alcohol use to avoid further damage. She needs to come in to get a Hepatitis panel drawn. You may place the order. Thanks. ------

## 2013-10-10 NOTE — Telephone Encounter (Signed)
Patient notified of lab results and instructions from Provider. Appt for lab draw scheduled for tomorrow AM

## 2013-10-11 ENCOUNTER — Ambulatory Visit: Payer: No Typology Code available for payment source | Attending: Internal Medicine

## 2013-10-11 DIAGNOSIS — IMO0002 Reserved for concepts with insufficient information to code with codable children: Secondary | ICD-10-CM | POA: Insufficient documentation

## 2013-10-11 DIAGNOSIS — R7989 Other specified abnormal findings of blood chemistry: Secondary | ICD-10-CM

## 2013-10-11 DIAGNOSIS — R945 Abnormal results of liver function studies: Secondary | ICD-10-CM

## 2013-10-11 DIAGNOSIS — F1010 Alcohol abuse, uncomplicated: Secondary | ICD-10-CM | POA: Insufficient documentation

## 2013-10-11 DIAGNOSIS — I10 Essential (primary) hypertension: Secondary | ICD-10-CM | POA: Insufficient documentation

## 2013-10-11 DIAGNOSIS — F17200 Nicotine dependence, unspecified, uncomplicated: Secondary | ICD-10-CM | POA: Insufficient documentation

## 2013-10-12 LAB — HEPATITIS PANEL, ACUTE
HCV Ab: REACTIVE — AB
HEP A IGM: NONREACTIVE
HEP B S AG: NEGATIVE
Hep B C IgM: NONREACTIVE

## 2013-10-15 ENCOUNTER — Other Ambulatory Visit: Payer: Self-pay | Admitting: Internal Medicine

## 2013-10-17 ENCOUNTER — Telehealth: Payer: Self-pay | Admitting: Emergency Medicine

## 2013-10-17 ENCOUNTER — Other Ambulatory Visit: Payer: Self-pay | Admitting: Internal Medicine

## 2013-10-17 DIAGNOSIS — R7989 Other specified abnormal findings of blood chemistry: Secondary | ICD-10-CM

## 2013-10-17 DIAGNOSIS — R945 Abnormal results of liver function studies: Secondary | ICD-10-CM

## 2013-10-17 NOTE — Telephone Encounter (Signed)
Left message for pt to call for lab results and instructions. 

## 2013-10-17 NOTE — Telephone Encounter (Signed)
Message copied by Darlis Loan on Wed Oct 17, 2013  1:21 PM ------      Message from: Holland Commons A      Created: Wed Oct 17, 2013 10:22 AM       Please call patient and let her know her blood work came back reactive to hepatitis C virus she will need another test to confirm hepatitis C. Placed order for HCV RNA test and a referral to infectious disease. Thanks ------

## 2013-10-17 NOTE — Telephone Encounter (Signed)
Message copied by Darlis Loan on Wed Oct 17, 2013  3:23 PM ------      Message from: Holland Commons A      Created: Wed Oct 17, 2013 10:22 AM       Please call patient and let her know her blood work came back reactive to hepatitis C virus she will need another test to confirm hepatitis C. Placed order for HCV RNA test and a referral to infectious disease. Thanks ------

## 2013-10-18 ENCOUNTER — Ambulatory Visit: Payer: No Typology Code available for payment source | Attending: Internal Medicine

## 2013-10-18 ENCOUNTER — Telehealth: Payer: Self-pay | Admitting: Emergency Medicine

## 2013-10-18 DIAGNOSIS — R7989 Other specified abnormal findings of blood chemistry: Secondary | ICD-10-CM

## 2013-10-18 DIAGNOSIS — R945 Abnormal results of liver function studies: Secondary | ICD-10-CM

## 2013-10-18 NOTE — Telephone Encounter (Signed)
Pt given lab results with instructions to return to clinic for further blood work' Pt verbalized understanding. She will be in this afternoon

## 2013-10-22 ENCOUNTER — Telehealth: Payer: Self-pay | Admitting: Emergency Medicine

## 2013-10-22 LAB — HEPATITIS C RNA QUANTITATIVE
HCV Quantitative Log: 6.44 {Log} — ABNORMAL HIGH (ref <1.18)
HCV Quantitative: 2746885 IU/mL — ABNORMAL HIGH (ref <15)

## 2013-10-22 NOTE — Telephone Encounter (Signed)
Pt called in requesting lab results. Informed lab work is still pending. We will notify when results return

## 2013-10-23 ENCOUNTER — Telehealth: Payer: Self-pay | Admitting: Emergency Medicine

## 2013-10-23 DIAGNOSIS — B1920 Unspecified viral hepatitis C without hepatic coma: Secondary | ICD-10-CM

## 2013-10-23 NOTE — Telephone Encounter (Signed)
Pt informed Hep C blood work positive. Infectious disease referral placed

## 2013-10-24 ENCOUNTER — Ambulatory Visit: Payer: No Typology Code available for payment source | Attending: Internal Medicine | Admitting: Internal Medicine

## 2013-10-24 ENCOUNTER — Encounter: Payer: Self-pay | Admitting: Internal Medicine

## 2013-10-24 VITALS — BP 137/85 | HR 61 | Temp 98.6°F | Resp 14 | Ht 61.0 in | Wt 152.2 lb

## 2013-10-24 DIAGNOSIS — Z880 Allergy status to penicillin: Secondary | ICD-10-CM | POA: Insufficient documentation

## 2013-10-24 DIAGNOSIS — B1920 Unspecified viral hepatitis C without hepatic coma: Secondary | ICD-10-CM | POA: Insufficient documentation

## 2013-10-24 DIAGNOSIS — M25559 Pain in unspecified hip: Secondary | ICD-10-CM | POA: Insufficient documentation

## 2013-10-24 DIAGNOSIS — I10 Essential (primary) hypertension: Secondary | ICD-10-CM | POA: Insufficient documentation

## 2013-10-24 MED ORDER — TRAMADOL HCL 50 MG PO TABS: 50.0000 mg | ORAL_TABLET | Freq: Two times a day (BID) | ORAL | Status: DC

## 2013-10-24 NOTE — Progress Notes (Signed)
Patient ID: Rachel Hudson, female   DOB: 18-Apr-1961, 53 y.o.   MRN: [id number]  CC: f/u HTN, Hep C, back pain  HPI:  Patient presents today for information pertaining to new Hepatitis C diagnoses.  Patient reports that she has never used IV drugs and has had the same sexual partner for 25 years.  She is currently being seen by [facility] and was switched off Lisinopril due to cough. Was started on atenolol/chlorthalidone 50-25 mg last week.  Took medication this AM but vomitted due to coughing shortly after.  On clindamycin for tooth infection.  Was given Naproxen by other clinic for back pain.  Has been trying to cut back on drink. Drinks 2 40 oz drinks per week. She also c/o of pain in right hip and leg.  She reports a achy pain that starts in her lower back and moves to her right leg.  Patient has been approved for pain clinic with injections only, she will call to make appointment.    Allergies  Allergen Reactions  . Penicillins     Facial swelling   Past Medical History  Diagnosis Date  . Hypertension   . Hep C w/o coma, chronic As of 10/22/13   Current Outpatient Prescriptions on File Prior to Visit  Medication Sig Dispense Refill  . cephALEXin (KEFLEX) 500 MG capsule Take 1 capsule (500 mg total) by mouth 4 (four) times daily.  28 capsule  0  . HYDROcodone-acetaminophen (NORCO) 7.5-325 MG per tablet Take 1 tablet by mouth every 4 (four) hours as needed.  15 tablet  0  . ibuprofen (ADVIL,MOTRIN) 800 MG tablet Take 800 mg by mouth 3 (three) times daily with meals.        Marland Kitchen LISINOPRIL PO Take by mouth.      . methylPREDNISolone (MEDROL DOSEPAK) 4 MG tablet follow package directions  21 tablet  0  . penicillin v potassium (VEETID) 500 MG tablet Take 500 mg by mouth 3 (three) times daily. Pt started on Friday 11/3. Pt has only had 1 on 11/4. For 10 days.        No current facility-administered medications on file prior to visit.   No family history on file. History    Social History  . Marital Status: Single    Spouse Name: N/A    Number of Children: N/A  . Years of Education: N/A   Occupational History  . Not on file.   Social History Main Topics  . Smoking status: Never Smoker   . Smokeless tobacco: Not on file  . Alcohol Use: 0.6 oz/week    1 Shots of liquor per week  . Drug Use: 7.00 per week    Special: Marijuana  . Sexual Activity: Yes    Birth Control/ Protection: None   Other Topics Concern  . Not on file   Social History Narrative  . No narrative on file    Review of Systems: See HPI   Objective:   Filed Vitals:   10/24/13 1050  BP: 137/85  Pulse: 61  Temp: 98.6 F (37 C)  Resp: 14   Physical Exam  Vitals reviewed. Eyes: Pupils are equal, round, and reactive to light. Scleral icterus is present.  Neck: Normal range of motion. Neck supple. No JVD present.  Cardiovascular: Normal rate, regular rhythm and normal heart sounds.   Pulmonary/Chest: Effort normal and breath sounds normal.  Abdominal: Soft. Bowel sounds are normal. She exhibits no distension. There is no tenderness.  Slightly enlarged liver   Musculoskeletal: Normal range of motion. She exhibits no edema and no tenderness.  Negative straight leg raises   Lymphadenopathy:    She has no cervical adenopathy.  Neurological: She is alert. She has normal reflexes.  Skin: Skin is warm and dry.  Psychiatric: She has a normal mood and affect.   Lab Results  Component Value Date   WBC 9.9 09/28/2013   HGB 13.6 09/28/2013   HCT 39.0 09/28/2013   MCV 89.9 09/28/2013   PLT 161 09/28/2013   Lab Results  Component Value Date   CREATININE 0.70 09/28/2013   BUN 16 09/28/2013   NA 134* 09/28/2013   K 3.6 09/28/2013   CL 100 09/28/2013   CO2 26 09/28/2013    Lab Results  Component Value Date   HGBA1C 5.7* 09/28/2013   Lipid Panel     Component Value Date/Time   CHOL 177 09/28/2013 0904   TRIG 124 09/28/2013 0904   HDL 64 09/28/2013 0904   CHOLHDL 2.8 09/28/2013 0904   VLDL  25 09/28/2013 0904   LDLCALC 88 09/28/2013 0904       Assessment and plan:   Scarlette CalicoFrances was seen today for follow-up, leg pain and hepatitis c.  Diagnoses and associated orders for this visit:  Hepatitis C First appointment with Infectious Disease in August. Patient needs to have partner to come to clinic and be tested for Hepatitis C as well. Explained to patient that she needs to avoid alcohol and tylenol.  HTN (hypertension) Continue with prescribed regimen from [facility].  Patient does not need to managed by both clinics. She reports that she gets her medications free from that clinic and has to pay here. Hip pain - traMADol (ULTRAM) 50 MG tablet; Take 1 tablet (50 mg total) by mouth 2 (two) times daily. Will give rx of pain medication until she is able to make appointment with pain clinic.    Return in about 3 months (around 01/24/2014).       Ambrose FinlandValerie A Jesstin Studstill, NP-C [facility] [phone] 10/25/2013, 9:20 AM

## 2013-10-24 NOTE — Patient Instructions (Signed)
Hepatitis C Hepatitis C is a viral infection of the liver. Infection may go undetected for months or years because symptoms may be absent or very mild. Chronic liver disease is the main danger of hepatitis C. This may lead to scarring of the liver (cirrhosis), liver failure, and liver cancer. CAUSES  Hepatitis C is caused by the hepatitis C virus (HCV). Formerly, hepatitis C infections were most commonly transmitted through blood transfusions. In the early 1990s, routine testing of donated blood for hepatitis C and exclusion of blood that tests positive for HCV began. Now, HCV is most commonly transmitted from person to person through injection drug use, sharing needles, or sex with an infected person. A caregiver may also get the infection from exposure to the blood of an infected patient by way of a cut or needle stick.  SYMPTOMS  Acute Phase Many cases of acute HCV infection are mild and cause few problems.Some people may not even realize they are sick.Symptoms in others may last a few weeks to several months and include:  Feeling very tired.  Loss of appetite.  Nausea.  Vomiting.  Abdominal pain.  Dark yellow urine.  Yellow skin and eyes (jaundice).  Itching of the skin. Chronic Phase  Between 50% to 85% of people who get HCV infection become "chronic carriers." They often have no symptoms, but the virus stays in their body.They may spread the virus to others and can get long-term liver disease.  Many people with chronic HCV infection remain healthy for many years. However, up to 1 in 5 chronically infected people may develop severe liver diseases including scarring of the liver (cirrhosis), liver failure, or liver cancer. DIAGNOSIS  Diagnosis of hepatitis C infection is made by testing blood for the presence of hepatitis C viral particles called RNA. Other tests may also be done to measure the status of current liver function, exclude other liver problems, or assess liver  damage. TREATMENT  Treatment with many antiviral drugs is available and recommended for some patients with chronic HCV infection. Drug treatment is generally considered appropriate for patients who:  Are 65 years of age or older.  Have a positive test for HCV particles in the blood.  Have a liver tissue sample (biopsy) that shows chronic hepatitis and significant scarring (fibrosis).  Do not have signs of liver failure.  Have acceptable blood test results that confirm the wellness of other body organs.  Are willing to be treated and conform to treatment requirements.  Have no other circumstances that would prevent treatment from being recommended (contraindications). All people who are offered and choose to receive drug treatment must understand that careful medical follow up for many months and even years is crucial in order to make successful care possible. The goal of drug treatment is to eliminate any evidence of HCV in the blood on a long-term basis. This is called a "sustained virologic response" or SVR. Achieving a SVR is associated with a decrease in the chance of life-threatening liver problems, need for a liver transplant, liver cancer rates, and liver-related complications. Successful treatment currently requires taking treatment drugs for at least 24 weeks and up to 72 weeks. An injected drug (interferon) given weekly and an oral antiviral medicine taken daily are usually prescribed. Side effects from these drugs are common and some may be very serious. Your response to treatment must be carefully monitored by both you and your caregiver throughout the entire treatment period. PREVENTION There is no vaccine for hepatitis C. The only  way to prevent the disease is to reduce the risk of exposure to the virus.   Avoid sharing drug needles or personal items like toothbrushes, razors, and nail clippers with an infected person.  Healthcare workers need to avoid injuries and wear  appropriate protective equipment such as gloves, gowns, and face masks when performing invasive medical or nursing procedures. HOME CARE INSTRUCTIONS  To avoid making your liver disease worse:  Strictly avoid drinking alcohol.  Carefully review all new prescriptions of medicines with your caregiver. Ask your caregiver which drugs you should avoid. The following drugs are toxic to the liver, and your caregiver may tell you to avoid them:  Isoniazid.  Methyldopa.  Acetaminophen.  Anabolic steroids (muscle-building drugs).  Erythromycin.  Oral contraceptives (birth control pills).  Check with your caregiver to make sure medicine you are currently taking will not be harmful.  Periodic blood tests may be required. Follow your caregiver's advice about when you should have blood tests.  Avoid a sexual relationship until advised otherwise by your caregiver.  Avoid activities that could expose other people to your blood. Examples include sharing a toothbrush, nail clippers, razors, and needles.  Bed rest is not necessary, but it may make you feel better. Recovery time is not related to the amount of rest you receive.  This infection is contagious. Follow your caregiver's instructions in order to avoid spread of the infection. SEEK IMMEDIATE MEDICAL CARE IF:  You have increasing fatigue or weakness.  You have an oral temperature above 102 F (38.9 C), not controlled by medicine.  You develop loss of appetite, nausea, or vomiting.  You develop jaundice.  You develop easy bruising or bleeding.  You develop any severe problems as a result of your treatment. MAKE SURE YOU:   Understand these instructions.  Will watch your condition.  Will get help right away if you are not doing well or get worse. Document Released: 05/07/2000 Document Revised: 08/02/2011 Document Reviewed: 09/09/2010 [facility] Patient Information 2014 [place], [place]. DASH Diet The DASH diet stands for  "Dietary Approaches to Stop Hypertension." It is a healthy eating plan that has been shown to reduce high blood pressure (hypertension) in as little as 14 days, while also possibly providing other significant health benefits. These other health benefits include reducing the risk of breast cancer after menopause and reducing the risk of type 2 diabetes, heart disease, colon cancer, and stroke. Health benefits also include weight loss and slowing kidney failure in patients with chronic kidney disease.  DIET GUIDELINES  Limit salt (sodium). Your diet should contain less than 1500 mg of sodium daily.  Limit refined or processed carbohydrates. Your diet should include mostly whole grains. Desserts and added sugars should be used sparingly.  Include small amounts of heart-healthy fats. These types of fats include nuts, oils, and tub margarine. Limit saturated and trans fats. These fats have been shown to be harmful in the body. CHOOSING FOODS  The following food groups are based on a 2000 calorie diet. See your Registered Dietitian for individual calorie needs. Grains and Grain Products (6 to 8 servings daily)  Eat More Often: Whole-wheat bread, brown rice, whole-grain or wheat pasta, quinoa, popcorn without added fat or salt (air popped).  Eat Less Often: White bread, white pasta, white rice, cornbread. Vegetables (4 to 5 servings daily)  Eat More Often: Fresh, frozen, and canned vegetables. Vegetables may be raw, steamed, roasted, or grilled with a minimal amount of fat.  Eat Less Often/Avoid: Creamed or fried  vegetables. Vegetables in a cheese sauce. Fruit (4 to 5 servings daily)  Eat More Often: All fresh, canned (in natural juice), or frozen fruits. Dried fruits without added sugar. One hundred percent fruit juice ( cup [237 mL] daily).  Eat Less Often: Dried fruits with added sugar. Canned fruit in light or heavy syrup. Foot LockerLean Meats, Fish, and Poultry (2 servings or less daily. One serving is  3 to 4 oz [85-114 g]).  Eat More Often: Ninety percent or leaner ground beef, tenderloin, sirloin. Round cuts of beef, chicken breast, Mal[place]urkey breast. All fish. Grill, bake, or broil your meat. Nothing should be fried.  Eat Less Often/Avoid: Fatty cuts of meat, [place]key, or chicken leg, thigh, or wing. Fried cuts of meat or fish. Dairy (2 to 3 servings)  Eat More Often: Low-fat or fat-free milk, low-fat plain or light yogurt, reduced-fat or part-skim cheese.  Eat Less Often/Avoid: Milk (whole, 2%).Whole milk yogurt. Full-fat cheeses. Nuts, Seeds, and Legumes (4 to 5 servings per week)  Eat More Often: All without added salt.  Eat Less Often/Avoid: Salted nuts and seeds, canned beans with added salt. Fats and Sweets (limited)  Eat More Often: Vegetable oils, tub margarines without trans fats, sugar-free gelatin. Mayonnaise and salad dressings.  Eat Less Often/Avoid: Coconut oils, palm oils, butter, stick margarine, cream, half and half, cookies, candy, pie. FOR MORE INFORMATION The Dash Diet Eating Plan: www.dashdiet.org Document Released: 04/29/2011 Document Revised: 08/02/2011 Document Reviewed: 04/29/2011 [facility] Patient Information 2014 [place]Care, [place].

## 2013-10-24 NOTE — Progress Notes (Signed)
Patient is here to F/U on Right hip pain. States she was given diagnosis of Hepatitis C yesterday. Requesting referral for pain management

## 2013-10-25 DIAGNOSIS — B182 Chronic viral hepatitis C: Secondary | ICD-10-CM | POA: Insufficient documentation

## 2013-11-06 ENCOUNTER — Encounter: Payer: Self-pay | Admitting: Physical Medicine & Rehabilitation

## 2013-12-31 ENCOUNTER — Encounter: Payer: Self-pay | Admitting: Internal Medicine

## 2013-12-31 ENCOUNTER — Ambulatory Visit (INDEPENDENT_AMBULATORY_CARE_PROVIDER_SITE_OTHER): Payer: No Typology Code available for payment source | Admitting: Internal Medicine

## 2013-12-31 VITALS — BP 194/117 | HR 64 | Temp 98.3°F | Ht 61.0 in | Wt 155.0 lb

## 2013-12-31 DIAGNOSIS — B182 Chronic viral hepatitis C: Secondary | ICD-10-CM

## 2013-12-31 LAB — COMPLETE METABOLIC PANEL WITH GFR
ALBUMIN: 3.7 g/dL (ref 3.5–5.2)
ALK PHOS: 66 U/L (ref 39–117)
ALT: 177 U/L — ABNORMAL HIGH (ref 0–35)
AST: 164 U/L — AB (ref 0–37)
BILIRUBIN TOTAL: 0.9 mg/dL (ref 0.2–1.2)
BUN: 9 mg/dL (ref 6–23)
CO2: 24 mEq/L (ref 19–32)
Calcium: 8.6 mg/dL (ref 8.4–10.5)
Chloride: 104 mEq/L (ref 96–112)
Creat: 0.65 mg/dL (ref 0.50–1.10)
GFR, Est African American: >89 mL/min
GLUCOSE: 72 mg/dL (ref 70–99)
Potassium: 4.2 mEq/L (ref 3.5–5.3)
Sodium: 138 mEq/L (ref 135–145)
Total Protein: 7 g/dL (ref 6.0–8.3)

## 2013-12-31 LAB — CBC WITH DIFFERENTIAL/PLATELET
BASOS ABS: 0.1 10*3/uL (ref 0.0–0.1)
BASOS PCT: 1 % (ref 0–1)
EOS ABS: 0.1 10*3/uL (ref 0.0–0.7)
Eosinophils Relative: 2 % (ref 0–5)
HCT: 40.1 % (ref 36.0–46.0)
HEMOGLOBIN: 13.8 g/dL (ref 12.0–15.0)
Lymphocytes Relative: 45 % (ref 12–46)
Lymphs Abs: 2.8 10*3/uL (ref 0.7–4.0)
MCH: 31.7 pg (ref 26.0–34.0)
MCHC: 34.4 g/dL (ref 30.0–36.0)
MCV: 92.2 fL (ref 78.0–100.0)
Monocytes Absolute: 0.7 10*3/uL (ref 0.1–1.0)
Monocytes Relative: 11 % (ref 3–12)
NEUTROS PCT: 41 % — AB (ref 43–77)
Neutro Abs: 2.5 10*3/uL (ref 1.7–7.7)
PLATELETS: 107 10*3/uL — AB (ref 150–400)
RBC: 4.35 MIL/uL (ref 3.87–5.11)
RDW: 14.7 % (ref 11.5–15.5)
WBC: 6.2 10*3/uL (ref 4.0–10.5)

## 2013-12-31 LAB — PROTIME-INR
INR: 1.16 (ref <1.50)
PROTHROMBIN TIME: 14.8 s (ref 11.6–15.2)

## 2013-12-31 LAB — IRON: IRON: 356 ug/dL — AB (ref 42–145)

## 2013-12-31 NOTE — Progress Notes (Signed)
+  Rachel Hudson is a 53 y.o. female who presents for initial evaluation and management of a positive Hepatitis C antibody test.  Patient tested positive several years ago. Test was performed as part of an evaluation of abnormal liver function test:(elevated ALT). Hepatitis C risk factors present are: none. Patient denies intranasal drug use, IV drug abuse, sexual contact with person with liver disease. Patient has not had other studies performed. Results: hepatitis C RNA by PCR, result: positive. Patient has not had prior treatment for Hepatitis C. Patient does not have a past history of liver disease. Patient does not have a family history of liver disease.   HPI:   Patient does not have documented immunity to Hepatitis A. Patient does not have documented immunity to Hepatitis B.     Review of Systems A comprehensive review of systems was negative.   Past Medical History  Diagnosis Date  . Hypertension   . Hep C w/o coma, chronic As of 10/22/13    History  Substance Use Topics  . Smoking status: Never Smoker   . Smokeless tobacco: Never Used  . Alcohol Use: Yes     Comment: 2 pints per week    No family history on file.    Objective:   Filed Vitals:   12/31/13 1021  BP: 194/117  Pulse: 64  Temp: 98.3 F (36.8 C)   in no apparent distress, alert and oriented times 3 HEENT: anicteric Cor RRR and No murmurs clear Bowel sounds are normal, liver is not enlarged, spleen is not enlarged peripheral pulses normal, no pedal edema, no clubbing or cyanosis negative for - jaundice, spider hemangioma, telangiectasia, palmar erythema, ecchymosis and atrophy  Laboratory Genotype:  No results found for this basename: hcvgenotype   HCV viral load:  Lab Results  Component Value Date   HCVQUANT [id number]* 10/18/2013   Lab Results  Component Value Date   WBC 9.9 09/28/2013   HGB 13.6 09/28/2013   HCT 39.0 09/28/2013   MCV 89.9 09/28/2013   PLT 161 09/28/2013    Lab Results  Component  Value Date   CREATININE 0.70 09/28/2013   BUN 16 09/28/2013   NA 134* 09/28/2013   K 3.6 09/28/2013   CL 100 09/28/2013   CO2 26 09/28/2013    Lab Results  Component Value Date   ALT 174* 09/28/2013   AST 132* 09/28/2013   ALKPHOS 70 09/28/2013   BILITOT 0.5 09/28/2013      Assessment: Hepatitis C genotype unknown  Plan: 1) Patient counseled extensively on limiting acetaminophen to no more than 2 grams daily, avoidance of alcohol. 2) Transmission discussed with patient including sexual transmission, sharing razors and toothbrush.   3) Will need referral to gastroenterology: No.unless indicated 4) Will need referral for substance abuse counseling: Yes.  she has a significant alcohol use history and though is decreasing, still needs to find ways to abstain.   5) Will prescribe Harvoni for 12 weeks once work up complete 6) Hepatitis A vaccine No.unless indicated on labs.  7) Hepatitis B vaccine No. 8) Pneumovax vaccine No.unless otherwise indicated.

## 2014-01-01 ENCOUNTER — Other Ambulatory Visit: Payer: Self-pay | Admitting: Internal Medicine

## 2014-01-01 ENCOUNTER — Other Ambulatory Visit (INDEPENDENT_AMBULATORY_CARE_PROVIDER_SITE_OTHER): Payer: No Typology Code available for payment source

## 2014-01-01 DIAGNOSIS — B182 Chronic viral hepatitis C: Secondary | ICD-10-CM

## 2014-01-01 LAB — HEPATITIS B CORE ANTIBODY, TOTAL: Hep B Core Total Ab: REACTIVE — AB

## 2014-01-01 LAB — HIV ANTIBODY (ROUTINE TESTING W REFLEX): HIV: NONREACTIVE

## 2014-01-01 LAB — HEPATITIS B SURFACE ANTIGEN: Hepatitis B Surface Ag: NEGATIVE

## 2014-01-01 LAB — HEPATITIS A ANTIBODY, TOTAL: Hep A Total Ab: REACTIVE — AB

## 2014-01-01 LAB — ANA: Anti Nuclear Antibody(ANA): NEGATIVE

## 2014-01-01 LAB — HEPATITIS B SURFACE ANTIBODY,QUALITATIVE: Hep B S Ab: NEGATIVE

## 2014-01-02 LAB — HEPATITIS C RNA QUANTITATIVE
HCV Quantitative Log: 6.34 {Log} — ABNORMAL HIGH (ref <1.18)
HCV Quantitative: 2199372 IU/mL — ABNORMAL HIGH (ref <15)

## 2014-01-04 ENCOUNTER — Other Ambulatory Visit: Payer: Self-pay | Admitting: Internal Medicine

## 2014-01-04 DIAGNOSIS — B182 Chronic viral hepatitis C: Secondary | ICD-10-CM

## 2014-01-04 NOTE — Addendum Note (Signed)
Addended by: Mariea ClontsGREEN, Madia Carvell D on: 01/04/2014 12:35 PM   Modules accepted: Orders

## 2014-01-08 LAB — HEPATITIS C GENOTYPE

## 2014-01-10 ENCOUNTER — Ambulatory Visit (HOSPITAL_COMMUNITY)
Admission: RE | Admit: 2014-01-10 | Discharge: 2014-01-10 | Disposition: A | Discharge status: Home / Self Care | Payer: Self-pay | Source: Ambulatory Visit | Attending: Internal Medicine | Admitting: Internal Medicine

## 2014-01-10 DIAGNOSIS — B182 Chronic viral hepatitis C: Secondary | ICD-10-CM | POA: Insufficient documentation

## 2014-01-14 ENCOUNTER — Other Ambulatory Visit: Payer: Self-pay | Admitting: Internal Medicine

## 2014-01-14 MED ORDER — LEDIPASVIR-SOFOSBUVIR 90-400 MG PO TABS: 1.0000 | ORAL_TABLET | Freq: Every day | ORAL | Status: DC

## 2014-01-22 ENCOUNTER — Ambulatory Visit: Payer: No Typology Code available for payment source

## 2014-01-22 DIAGNOSIS — K7460 Unspecified cirrhosis of liver: Secondary | ICD-10-CM

## 2014-01-22 HISTORY — DX: Unspecified cirrhosis of liver: K74.60

## 2014-01-24 ENCOUNTER — Ambulatory Visit: Payer: No Typology Code available for payment source | Admitting: Internal Medicine

## 2014-02-04 ENCOUNTER — Encounter: Payer: Self-pay | Admitting: Internal Medicine

## 2014-02-04 ENCOUNTER — Ambulatory Visit (INDEPENDENT_AMBULATORY_CARE_PROVIDER_SITE_OTHER): Payer: No Typology Code available for payment source | Admitting: Internal Medicine

## 2014-02-04 VITALS — BP 125/82 | HR 76 | Temp 98.1°F | Wt 151.0 lb

## 2014-02-04 DIAGNOSIS — B182 Chronic viral hepatitis C: Secondary | ICD-10-CM

## 2014-02-04 DIAGNOSIS — Z23 Encounter for immunization: Secondary | ICD-10-CM

## 2014-02-04 NOTE — Progress Notes (Signed)
   Subjective:    Patient ID: Rachel Hudson, female    DOB: July 11, 1960, 53 y.o.   MRN: [id number]  HPI Here for follow up of hepatitis C.  Never treated, has genotype 1a, vl [id number].  Hep A and B immune. Elastography F4.  Has completely quit drinking alcohol.      Review of Systems  Constitutional: Positive for fatigue.  Gastrointestinal: Negative for diarrhea.       Objective:   Physical Exam  Constitutional: She appears well-developed and well-nourished. No distress.  Eyes: No scleral icterus.  Cardiovascular: Normal rate, regular rhythm and normal heart sounds.   No murmur heard. Skin: No rash noted.          Assessment & Plan:

## 2014-02-04 NOTE — Assessment & Plan Note (Signed)
Discussed results of elastography and are consistent with cirrhosis.  Also US shows cirrhosis.  Harvoni prescription sent to [facility] and hopefully will be able to get.   Emphasized no alcohol.  Would like to get to GI in the future if/when she has access for cirrhosis managemtn.  Will need screening ultrasound every 6 months for [facility] screen.  Will need pneumovax next visit.   RTC once on treatment.

## 2014-02-28 ENCOUNTER — Ambulatory Visit: Payer: No Typology Code available for payment source | Attending: Internal Medicine | Admitting: Internal Medicine

## 2014-02-28 ENCOUNTER — Encounter: Payer: Self-pay | Admitting: Internal Medicine

## 2014-02-28 VITALS — BP 149/81 | HR 61 | Temp 98.0°F | Resp 20 | Ht 61.0 in | Wt 151.4 lb

## 2014-02-28 DIAGNOSIS — F1021 Alcohol dependence, in remission: Secondary | ICD-10-CM | POA: Insufficient documentation

## 2014-02-28 DIAGNOSIS — I10 Essential (primary) hypertension: Secondary | ICD-10-CM | POA: Insufficient documentation

## 2014-02-28 DIAGNOSIS — B1920 Unspecified viral hepatitis C without hepatic coma: Secondary | ICD-10-CM

## 2014-02-28 DIAGNOSIS — Z791 Long term (current) use of non-steroidal anti-inflammatories (NSAID): Secondary | ICD-10-CM | POA: Insufficient documentation

## 2014-02-28 DIAGNOSIS — B182 Chronic viral hepatitis C: Secondary | ICD-10-CM | POA: Insufficient documentation

## 2014-02-28 DIAGNOSIS — K7460 Unspecified cirrhosis of liver: Secondary | ICD-10-CM | POA: Insufficient documentation

## 2014-02-28 DIAGNOSIS — F1290 Cannabis use, unspecified, uncomplicated: Secondary | ICD-10-CM | POA: Insufficient documentation

## 2014-02-28 NOTE — Progress Notes (Signed)
Patient presents for f/u hepatitis C Waiting to see if she has been approved for med to treat Hep C States no alcohol for 2 months and feels "wonderful"

## 2014-02-28 NOTE — Progress Notes (Signed)
Patient ID: Rachel Hudson, female   DOB: December 24, 1960, 53 y.o.   MRN: [id number]  CC: Hepatitis C  HPI:  Patient reports that she is here today to find out if she has been approved for Harvoni.  She states that she has stopped drinking alcohol two months ago.  She states that her partner is in the process of getting treatment but hers can not statrt until she gets approval for the medication.  She reports that she is still smoking marijuana but will not drink alcohol again.    Allergies  Allergen Reactions  . Penicillins     Facial swelling   Past Medical History  Diagnosis Date  . Hypertension   . Hep C w/o coma, chronic As of 10/22/13  . 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 atenolol-chlorthalidone (TENORETIC) 50-25 MG per tablet Take 1 tablet by mouth daily.      Marland Kitchen HYDROcodone-acetaminophen (NORCO) 7.5-325 MG per tablet Take 1 tablet by mouth every 4 (four) hours as needed.  15 tablet  0  . ibuprofen (ADVIL,MOTRIN) 800 MG tablet Take 800 mg by mouth 3 (three) times daily with meals.        . Ledipasvir-Sofosbuvir (HARVONI) 90-400 MG TABS Take 1 tablet by mouth daily.  28 tablet  2  . naproxen (NAPROSYN) 500 MG tablet Take 500 mg by mouth 2 (two) times daily with a meal.      . traMADol (ULTRAM) 50 MG tablet Take 1 tablet (50 mg total) by mouth 2 (two) times daily.  40 tablet  0   No current facility-administered medications on file prior to visit.   Family History  Problem Relation Age of Onset  . Adopted: Yes  . Family history unknown: Yes   History   Social History  . Marital Status: Single    Spouse Name: N/A    Number of Children: N/A  . Years of Education: N/A   Occupational History  . Not on file.   Social History Main Topics  . Smoking status: Never Smoker   . Smokeless tobacco: Never Used  . Alcohol Use: No     Comment: 2 pints per week/stopped 01/10/14  . Drug  Use: 7.00 per week    Special: Marijuana  . Sexual Activity: Yes    Birth Control/ Protection: None   Other Topics Concern  . Not on file   Social History Narrative  . No narrative on file    Review of Systems  Musculoskeletal: Positive for back pain.  All other systems reviewed and are negative.     Objective:   Filed Vitals:   02/28/14 1230  BP: 149/81  Pulse: 61  Temp: 98 F (36.7 C)  Resp: 20    Physical Exam  Constitutional: She is oriented to person, place, and time.  Cardiovascular: Normal rate, regular rhythm and normal heart sounds.   Pulmonary/Chest: Effort normal and breath sounds normal.  Neurological: She is alert and oriented to person, place, and time.  Skin: Skin is warm and dry.     Lab Results  Component Value Date   WBC 6.2 12/31/2013   HGB 13.8 12/31/2013   HCT 40.1 12/31/2013   MCV 92.2 12/31/2013   PLT 107* 12/31/2013   Lab Results  Component Value Date   CREATININE 0.65 12/31/2013   BUN 9 12/31/2013   NA 138 12/31/2013  K 4.2 12/31/2013   CL 104 12/31/2013   CO2 24 12/31/2013    Lab Results  Component Value Date   HGBA1C 5.7* 09/28/2013   Lipid Panel     Component Value Date/Time   CHOL 177 09/28/2013 0904   TRIG 124 09/28/2013 0904   HDL 64 09/28/2013 0904   CHOLHDL 2.8 09/28/2013 0904   VLDL 25 09/28/2013 0904   LDLCALC 88 09/28/2013 0904       Assessment and plan:   Rachel Hudson was seen today for follow-up and hepatitis c.  Diagnoses and associated orders for this visit:  Hepatitis C virus infection without hepatic coma, unspecified chronicity Checked with pharmacy, Harvoni has not been approved yet.  gave patient information to pharmacy to check status of medication Continue care with infectious disease Presentation for stopping alcohol use     Return if symptoms worsen or fail to improve.   Holland CommonsKECK, Heidi Maclin, NP-C [facility] [phone] 02/28/2014, 12:43 PM

## 2014-03-04 ENCOUNTER — Ambulatory Visit: Payer: No Typology Code available for payment source | Attending: Internal Medicine

## 2014-03-18 ENCOUNTER — Ambulatory Visit: Payer: No Typology Code available for payment source | Attending: Internal Medicine

## 2014-07-14 ENCOUNTER — Encounter (HOSPITAL_COMMUNITY): Payer: Self-pay | Admitting: *Deleted

## 2014-07-14 ENCOUNTER — Emergency Department (HOSPITAL_COMMUNITY): Payer: Self-pay

## 2014-07-14 ENCOUNTER — Emergency Department (HOSPITAL_COMMUNITY)
Admission: EM | Admit: 2014-07-14 | Discharge: 2014-07-14 | Disposition: A | Discharge status: Home / Self Care | Payer: Self-pay | Attending: Emergency Medicine | Admitting: Emergency Medicine

## 2014-07-14 DIAGNOSIS — Z23 Encounter for immunization: Secondary | ICD-10-CM | POA: Insufficient documentation

## 2014-07-14 DIAGNOSIS — Z880 Allergy status to penicillin: Secondary | ICD-10-CM | POA: Insufficient documentation

## 2014-07-14 DIAGNOSIS — Z79899 Other long term (current) drug therapy: Secondary | ICD-10-CM | POA: Insufficient documentation

## 2014-07-14 DIAGNOSIS — Y998 Other external cause status: Secondary | ICD-10-CM | POA: Insufficient documentation

## 2014-07-14 DIAGNOSIS — I10 Essential (primary) hypertension: Secondary | ICD-10-CM | POA: Insufficient documentation

## 2014-07-14 DIAGNOSIS — Y9289 Other specified places as the place of occurrence of the external cause: Secondary | ICD-10-CM | POA: Insufficient documentation

## 2014-07-14 DIAGNOSIS — Z791 Long term (current) use of non-steroidal anti-inflammatories (NSAID): Secondary | ICD-10-CM | POA: Insufficient documentation

## 2014-07-14 DIAGNOSIS — Y9389 Activity, other specified: Secondary | ICD-10-CM | POA: Insufficient documentation

## 2014-07-14 DIAGNOSIS — S60221A Contusion of right hand, initial encounter: Secondary | ICD-10-CM | POA: Insufficient documentation

## 2014-07-14 DIAGNOSIS — Z8719 Personal history of other diseases of the digestive system: Secondary | ICD-10-CM | POA: Insufficient documentation

## 2014-07-14 DIAGNOSIS — Z8619 Personal history of other infectious and parasitic diseases: Secondary | ICD-10-CM | POA: Insufficient documentation

## 2014-07-14 DIAGNOSIS — S43402A Unspecified sprain of left shoulder joint, initial encounter: Secondary | ICD-10-CM | POA: Insufficient documentation

## 2014-07-14 MED ORDER — TETANUS-DIPHTH-ACELL PERTUSSIS 5-2.5-18.5 LF-MCG/0.5 IM SUSP
0.5000 mL | Freq: Once | INTRAMUSCULAR | Status: AC
  Administered 2014-07-14: 0.5 mL via INTRAMUSCULAR
  Filled 2014-07-14: qty 0.5

## 2014-07-14 MED ORDER — TRAMADOL HCL 50 MG PO TABS: 50.0000 mg | ORAL_TABLET | Freq: Four times a day (QID) | ORAL | Status: DC | PRN

## 2014-07-14 MED ORDER — NAPROXEN 500 MG PO TABS: 500.0000 mg | ORAL_TABLET | Freq: Two times a day (BID) | ORAL | Status: DC

## 2014-07-14 NOTE — Discharge Instructions (Signed)
Keep hand elevated. Ice. Bacitracin on abrasion twice a day. Ice shoulder as well. No heavy lifting. Naprosyn and tramadol for pain. Follow up with your doctor.  Hand Contusion A hand contusion is a deep bruise on your hand area. Contusions are the result of an injury that caused bleeding under the skin. The contusion may turn blue, purple, or yellow. Minor injuries will give you a painless contusion, but more severe contusions may stay painful and swollen for a few weeks. CAUSES  A contusion is usually caused by a blow, trauma, or direct force to an area of the body. SYMPTOMS   Swelling and redness of the injured area.  Discoloration of the injured area.  Tenderness and soreness of the injured area.  Pain. DIAGNOSIS  The diagnosis can be made by taking a history and performing a physical exam. An X-ray, CT scan, or MRI may be needed to determine if there were any associated injuries, such as broken bones (fractures). TREATMENT  Often, the best treatment for a hand contusion is resting, elevating, icing, and applying cold compresses to the injured area. Over-the-counter medicines may also be recommended for pain control. HOME CARE INSTRUCTIONS   Put ice on the injured area.  Put ice in a plastic bag.  Place a towel between your skin and the bag.  Leave the ice on for 15-20 minutes, 03-04 times a day.  Only take over-the-counter or prescription medicines as directed by your caregiver. Your caregiver may recommend avoiding anti-inflammatory medicines (aspirin, ibuprofen, and naproxen) for 48 hours because these medicines may increase bruising.  If told, use an elastic wrap as directed. This can help reduce swelling. You may remove the wrap for sleeping, showering, and bathing. If your fingers become numb, cold, or blue, take the wrap off and reapply it more loosely.  Elevate your hand with pillows to reduce swelling.  Avoid overusing your hand if it is painful. SEEK IMMEDIATE  MEDICAL CARE IF:   You have increased redness, swelling, or pain in your hand.  Your swelling or pain is not relieved with medicines.  You have loss of feeling in your hand or are unable to move your fingers.  Your hand turns cold or blue.  You have pain when you move your fingers.  Your hand becomes warm to the touch.  Your contusion does not improve in 2 days. MAKE SURE YOU:   Understand these instructions.  Will watch your condition.  Will get help right away if you are not doing well or get worse. Document Released: 10/30/2001 Document Revised: 02/02/2012 Document Reviewed: 11/01/2011 [facility] Patient Information 2015 [employer] [place]. This information is not intended to replace advice given to you by your health care provider. Make sure you discuss any questions you have with your health care provider.   Shoulder Sprain A shoulder sprain is the result of damage to the tough, fiber-like tissues (ligaments) that help hold your shoulder in place. The ligaments may be stretched or torn. Besides the main shoulder joint (the ball and socket), there are several smaller joints that connect the bones in this area. A sprain usually involves one of those joints. Most often it is the acromioclavicular (or AC) joint. That is the joint that connects the collarbone (clavicle) and the shoulder blade (scapula) at the top point of the shoulder blade (acromion). A shoulder sprain is a mild form of what is called a shoulder separation. Recovering from a shoulder sprain may take some time. For some, pain lingers for several  months. Most people recover without long term problems. CAUSES   A shoulder sprain is usually caused by some kind of trauma. This might be:  Falling on an outstretched arm.  Being hit hard on the shoulder.  Twisting the arm.  Shoulder sprains are more likely to occur in people who:  Play sports.  Have balance or coordination problems. SYMPTOMS   Pain when you move your  shoulder.  Limited ability to move the shoulder.  Swelling and tenderness on top of the shoulder.  Redness or warmth in the shoulder.  Bruising.  A change in the shape of the shoulder. DIAGNOSIS  Your healthcare provider may:  Ask about your symptoms.  Ask about recent activity that might have caused those symptoms.  Examine your shoulder. You may be asked to do simple exercises to test movement. The other shoulder will be examined for comparison.  Order some tests that provide a look inside the body. They can show the extent of the injury. The tests could include:  X-rays.  CT (computed tomography) scan.  MRI (magnetic resonance imaging) scan. RISKS AND COMPLICATIONS  Loss of full shoulder motion.  Ongoing shoulder pain. TREATMENT  How long it takes to recover from a shoulder sprain depends on how severe it was. Treatment options may include:  Rest. You should not use the arm or shoulder until it heals.  Ice. For 2 or 3 days after the injury, put an ice pack on the shoulder up to 4 times a day. It should stay on for 15 to 20 minutes each time. Wrap the ice in a towel so it does not touch your skin.  Over-the-counter medicine to relieve pain.  A sling or brace. This will keep the arm still while the shoulder is healing.  Physical therapy or rehabilitation exercises. These will help you regain strength and motion. Ask your healthcare provider when it is OK to begin these exercises.  Surgery. The need for surgery is rare with a sprained shoulder, but some people may need surgery to keep the joint in place and reduce pain. HOME CARE INSTRUCTIONS   Ask your healthcare provider about what you should and should not do while your shoulder heals.  Make sure you know how to apply ice to the correct area of your shoulder.  Talk with your healthcare provider about which medications should be used for pain and swelling.  If rehabilitation therapy will be needed, ask your  healthcare provider to refer you to a therapist. If it is not recommended, then ask about at-home exercises. Find out when exercise should begin. SEEK MEDICAL CARE IF:  Your pain, swelling, or redness at the joint increases. SEEK IMMEDIATE MEDICAL CARE IF:   You have a fever.  You cannot move your arm or shoulder. Document Released: 09/26/2008 Document Revised: 08/02/2011 Document Reviewed: 09/26/2008 [facility] Patient Information 2015 [place], [place]. This information is not intended to replace advice given to you by your health care provider. Make sure you discuss any questions you have with your health care provider.
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
## 2014-07-14 NOTE — ED Notes (Signed)
Declined W/C at D/C and was escorted to lobby by RN.Declined W/C at D/C and was escorted to lobby by RN. 
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
## 2014-07-22 ENCOUNTER — Other Ambulatory Visit: Payer: Self-pay | Admitting: Internal Medicine

## 2014-07-22 ENCOUNTER — Other Ambulatory Visit: Payer: Self-pay | Admitting: Pharmacist Clinician (PhC)/ Clinical Pharmacy Specialist

## 2014-07-22 DIAGNOSIS — B1920 Unspecified viral hepatitis C without hepatic coma: Secondary | ICD-10-CM

## 2014-07-22 DIAGNOSIS — B182 Chronic viral hepatitis C: Secondary | ICD-10-CM

## 2014-07-30 ENCOUNTER — Other Ambulatory Visit: Payer: Self-pay

## 2014-07-30 DIAGNOSIS — B182 Chronic viral hepatitis C: Secondary | ICD-10-CM

## 2014-07-30 DIAGNOSIS — B1920 Unspecified viral hepatitis C without hepatic coma: Secondary | ICD-10-CM

## 2014-07-30 LAB — CBC WITH DIFFERENTIAL/PLATELET
BASOS ABS: 0 10*3/uL (ref 0.0–0.1)
Basophils Relative: 0 % (ref 0–1)
Eosinophils Absolute: 0.2 10*3/uL (ref 0.0–0.7)
Eosinophils Relative: 3 % (ref 0–5)
HCT: 38.4 % (ref 36.0–46.0)
Hemoglobin: 12.9 g/dL (ref 12.0–15.0)
LYMPHS PCT: 52 % — AB (ref 12–46)
Lymphs Abs: 4.1 10*3/uL — ABNORMAL HIGH (ref 0.7–4.0)
MCH: 30.4 pg (ref 26.0–34.0)
MCHC: 33.6 g/dL (ref 30.0–36.0)
MCV: 90.6 fL (ref 78.0–100.0)
MONO ABS: 0.6 10*3/uL (ref 0.1–1.0)
MPV: 12 fL (ref 8.6–12.4)
Monocytes Relative: 7 % (ref 3–12)
Neutro Abs: 3 10*3/uL (ref 1.7–7.7)
Neutrophils Relative %: 38 % — ABNORMAL LOW (ref 43–77)
PLATELETS: 139 10*3/uL — AB (ref 150–400)
RBC: 4.24 MIL/uL (ref 3.87–5.11)
RDW: 14.3 % (ref 11.5–15.5)
WBC: 7.9 10*3/uL (ref 4.0–10.5)

## 2014-07-30 LAB — COMPREHENSIVE METABOLIC PANEL
ALT: 15 U/L (ref 0–35)
AST: 17 U/L (ref 0–37)
Albumin: 3.9 g/dL (ref 3.5–5.2)
Alkaline Phosphatase: 48 U/L (ref 39–117)
BILIRUBIN TOTAL: 0.4 mg/dL (ref 0.2–1.2)
BUN: 13 mg/dL (ref 6–23)
CHLORIDE: 103 meq/L (ref 96–112)
CO2: 25 mEq/L (ref 19–32)
Calcium: 9 mg/dL (ref 8.4–10.5)
Creat: 0.69 mg/dL (ref 0.50–1.10)
GLUCOSE: 82 mg/dL (ref 70–99)
Potassium: 4.4 mEq/L (ref 3.5–5.3)
SODIUM: 137 meq/L (ref 135–145)
Total Protein: 7.1 g/dL (ref 6.0–8.3)

## 2014-08-01 LAB — HEPATITIS C RNA QUANTITATIVE: HCV Quantitative: NOT DETECTED IU/mL (ref <15)

## 2014-08-06 ENCOUNTER — Encounter: Payer: Self-pay | Admitting: Internal Medicine

## 2014-08-06 ENCOUNTER — Encounter: Payer: Self-pay | Admitting: *Deleted

## 2014-08-06 ENCOUNTER — Ambulatory Visit (INDEPENDENT_AMBULATORY_CARE_PROVIDER_SITE_OTHER): Payer: Self-pay | Admitting: Internal Medicine

## 2014-08-06 VITALS — BP 144/88 | HR 76 | Temp 98.5°F | Wt 157.0 lb

## 2014-08-06 DIAGNOSIS — B182 Chronic viral hepatitis C: Secondary | ICD-10-CM

## 2014-08-06 DIAGNOSIS — K7460 Unspecified cirrhosis of liver: Secondary | ICD-10-CM

## 2014-08-06 NOTE — Assessment & Plan Note (Signed)
Will check HCC screen in 3 months and every 6 months after that.  RTC 3 months.

## 2014-08-06 NOTE — Assessment & Plan Note (Signed)
She is pleased with completion of Harvoni and is now completely undetectable after treatment.  Will check again in 3 months to assure cure.

## 2014-08-06 NOTE — Progress Notes (Signed)
   Subjective:    Patient ID: Rachel CottaFrances M Hudson, female    DOB: 06-Jan-1961, 54 y.o.   MRN: [id number]  HPI Here for follow up of hepatitis C.  Never previously treated, has genotype 1a, vl [id number].  Hep A and B immune. Elastography F4.  Has completely quit drinking alcohol.   Got approved and has not been back until finishing.  Viral load now undetectable.  No issues.     Review of Systems  Constitutional: Negative for fatigue.  Gastrointestinal: Negative for diarrhea.  Skin: Negative for rash.       Objective:   Physical Exam  Constitutional: She appears well-developed and well-nourished. No distress.  Eyes: No scleral icterus.  Cardiovascular: Normal rate, regular rhythm and normal heart sounds.   No murmur heard. Skin: No rash noted.          Assessment & Plan:

## 2014-09-20 ENCOUNTER — Telehealth: Payer: Self-pay | Admitting: *Deleted

## 2014-09-20 NOTE — Telephone Encounter (Signed)
[facility]14/14 at 7:45 AM. She is aware nothing to eat or drink after midnight. Wendall MolaJacqueline Cockerham CMA

## 2014-10-08 ENCOUNTER — Ambulatory Visit: Payer: Self-pay | Attending: Internal Medicine

## 2014-10-14 ENCOUNTER — Ambulatory Visit (HOSPITAL_COMMUNITY)
Admission: RE | Admit: 2014-10-14 | Discharge: 2014-10-14 | Disposition: A | Discharge status: Home / Self Care | Payer: Self-pay | Source: Ambulatory Visit | Attending: Internal Medicine | Admitting: Internal Medicine

## 2014-10-14 DIAGNOSIS — K7460 Unspecified cirrhosis of liver: Secondary | ICD-10-CM | POA: Insufficient documentation

## 2014-10-14 DIAGNOSIS — K7689 Other specified diseases of liver: Secondary | ICD-10-CM | POA: Insufficient documentation

## 2014-10-23 ENCOUNTER — Other Ambulatory Visit: Payer: Self-pay

## 2014-10-23 DIAGNOSIS — B182 Chronic viral hepatitis C: Secondary | ICD-10-CM

## 2014-10-24 LAB — HEPATITIS C RNA QUANTITATIVE: HCV QUANT: NOT DETECTED [IU]/mL (ref <15)

## 2014-11-06 ENCOUNTER — Encounter: Payer: Self-pay | Admitting: Internal Medicine

## 2014-11-06 ENCOUNTER — Ambulatory Visit (INDEPENDENT_AMBULATORY_CARE_PROVIDER_SITE_OTHER): Payer: Self-pay | Admitting: Internal Medicine

## 2014-11-06 VITALS — BP 124/76 | HR 78 | Temp 97.8°F | Ht 61.0 in | Wt 161.0 lb

## 2014-11-06 DIAGNOSIS — K7460 Unspecified cirrhosis of liver: Secondary | ICD-10-CM

## 2014-11-06 DIAGNOSIS — B182 Chronic viral hepatitis C: Secondary | ICD-10-CM

## 2014-11-06 NOTE — Progress Notes (Signed)
   Subjective:    Patient ID: Rachel Hudson, female    DOB: 1961-03-02, 54 y.o.   MRN: [id number]  HPI Here for follow up of hepatitis C.  Never previously treated, has genotype 1a, vl [id number].  Hep A and B immune. Elastography F4.  Has completely quit drinking alcohol.   She finished [facility] over 3 months ago and SVR 12 is completely negative still. She is almost certainly considered cured though will recheck it again in about 6-12 months. Been able to get to gastroenterology since she is uninsured. I though will check with her primary physician if they have access to gastroenterology.    Review of Systems  Constitutional: Negative for fatigue.  Gastrointestinal: Negative for diarrhea.  Skin: Negative for rash.       Objective:   Physical Exam  Constitutional: She appears well-developed and well-nourished. No distress.  Eyes: No scleral icterus.  Cardiovascular: Normal rate, regular rhythm and normal heart sounds.   No murmur heard. Skin: No rash noted.          Assessment & Plan:

## 2014-11-06 NOTE — Assessment & Plan Note (Signed)
She remains undetectable at her SVR 12 and will check again in about 6 months.

## 2014-11-06 NOTE — Assessment & Plan Note (Signed)
She does have mildly low platelets and cirrhosis on ultrasound with F4 on elastography. We'll see about getting her in to gastroenterology.

## 2014-11-18 ENCOUNTER — Ambulatory Visit (HOSPITAL_BASED_OUTPATIENT_CLINIC_OR_DEPARTMENT_OTHER): Payer: Self-pay | Admitting: Physical Medicine & Rehabilitation

## 2014-11-18 ENCOUNTER — Encounter: Payer: Self-pay | Attending: Physical Medicine & Rehabilitation

## 2014-11-18 ENCOUNTER — Encounter: Payer: Self-pay | Admitting: Physical Medicine & Rehabilitation

## 2014-11-18 VITALS — BP 122/81 | HR 51 | Resp 14

## 2014-11-18 DIAGNOSIS — M5441 Lumbago with sciatica, right side: Secondary | ICD-10-CM

## 2014-11-18 DIAGNOSIS — G5602 Carpal tunnel syndrome, left upper limb: Secondary | ICD-10-CM | POA: Insufficient documentation

## 2014-11-18 DIAGNOSIS — G5600 Carpal tunnel syndrome, unspecified upper limb: Secondary | ICD-10-CM | POA: Insufficient documentation

## 2014-11-18 DIAGNOSIS — M545 Low back pain, unspecified: Secondary | ICD-10-CM

## 2014-11-18 DIAGNOSIS — G5601 Carpal tunnel syndrome, right upper limb: Secondary | ICD-10-CM

## 2014-11-18 DIAGNOSIS — M5442 Lumbago with sciatica, left side: Secondary | ICD-10-CM | POA: Insufficient documentation

## 2014-11-18 DIAGNOSIS — G5603 Carpal tunnel syndrome, bilateral upper limbs: Secondary | ICD-10-CM

## 2014-11-18 HISTORY — DX: Low back pain, unspecified: M54.50

## 2014-11-18 HISTORY — DX: Carpal tunnel syndrome, unspecified upper limb: G56.00

## 2014-11-18 NOTE — Patient Instructions (Addendum)
Change the way you take the prednisone. Use the schedule below  Take 3 tablets per day for 2 days 2 tablets per day for 2 days 1 tablet per day for 2 days Then stop  Also get wrist splints at the pharmacy, wear these every night

## 2014-11-18 NOTE — Progress Notes (Signed)
Subjective:    Patient ID: Rachel Hudson, female    DOB: 02-Jul-1960, 54 y.o.   MRN: [id number]  HPI Chief complaint is low back pain 54 year old female With history of hepatitis C and liver cirrhosis and past history of drug abuse and alcohol abuse who gives a two-year history of low back pain that goes into the legs. Patient also has pain that goes into the toes. She has no history of trauma to that area. The pain came on its own. Patient feels like the pain is getting worse with time.  Patient had a work related back injury some years ago, may be around 2006, saw a physician who wanted to do surgery but she did not want to do so at that time. She has other complaints including hand stiffness in the morning numbness and tingling and pain in the hands as well. Patient does not recall a history of carpal tunnel, she's never had hand surgery.  No bowel or bladder dysfunction   Pain goes from the low back area down the right buttocks right thigh and into the right big toe. Patient has similar symptoms down the left side of low back and left thigh and left big toe. The left big toe hurts more than the right toe. Patient has numbness and tingling in her feet.  Patient also complains of neck pain, had 1 episode of neck injury back when she was doing drugs about 5 years ago. Was seen in the emergency Department treated and released. Pain Inventory Average Pain 10 Pain Right Now 10 My pain is constant, sharp, burning, dull, stabbing, tingling and aching  In the last 24 hours, has pain interfered with the following? General activity 10 Relation with others 1 Enjoyment of life 10 What TIME of day is your pain at its worst? all Sleep (in general) Poor  Pain is worse with: walking, bending, sitting, inactivity, standing and some activites Pain improves with: rest Relief from Meds: 0  Mobility walk without assistance how many minutes can you walk? 1 ability to climb steps?  no do you  drive?  no  Function not employed: date last employed . disabled: date disabled .  Neuro/Psych weakness numbness tingling trouble walking spasms  Prior Studies new visit  Physicians involved in your care new visit   Family History  Problem Relation Age of Onset  . Adopted: Yes  . Family history unknown: Yes   History   Social History  . Marital Status: Single    Spouse Name: N/A  . Number of Children: N/A  . Years of Education: N/A   Social History Main Topics  . Smoking status: Never Smoker   . Smokeless tobacco: Never Used  . Alcohol Use: No     Comment: 2 pints per week/stopped 01/10/14  . Drug Use: 7.00 per week    Special: Marijuana  . Sexual Activity: Yes    Birth Control/ Protection: None   Other Topics Concern  . None   Social History Narrative   Past Surgical History  Procedure Laterality Date  . Ligament repair Right 1980    Knee   Past Medical History  Diagnosis Date  . Hypertension   . Hep C w/o coma, chronic As of 10/22/13  . Cirrhosis of liver September 2015    Stage 4   BP 122/81 mmHg  Pulse 51  Resp 14  SpO2 99%  LMP 11/04/2014  Opioid Risk Score: 2 Fall Risk Score:  `1  Depression screen  PHQ 2/9  Depression screen [facility] 2/9 11/18/2014 11/06/2014 08/06/2014 02/04/2014 12/31/2013 10/24/2013  Decreased Interest 3 1 0 0 0 1  Down, Depressed, Hopeless 0 0 0 0 0 0  PHQ - 2 Score 3 1 0 0 0 1  Altered sleeping 2 - - - - -  Tired, decreased energy 3 - - - - -  Change in appetite 3 - - - - -  Feeling bad or failure about yourself  0 - - - - -  Trouble concentrating 3 - - - - -  Moving slowly or fidgety/restless 3 - - - - -  Suicidal thoughts 0 - - - - -  PHQ-9 Score 17 - - - - -     Review of Systems  Constitutional:       Weight gain  Musculoskeletal: Positive for gait problem.  Neurological: Positive for weakness and numbness.       Tingling Spasms   All other systems reviewed and are negative.      Objective:   Physical  Exam  Constitutional: She is oriented to person, place, and time. She appears well-developed.  HENT:  Head: Normocephalic and atraumatic.  Eyes: EOM are normal. Pupils are equal, round, and reactive to light.  Musculoskeletal:  Large medial scar right knee  Tenderness to palpation bilateral greater trochanters Tenderness also along the lumbar paraspinals L4 L5 S1 area Negative straight leg raising Good range of motion in the ankles and knees. Lumbar spine range of motion is reduced no evidence of thoracic or lumbar scoliosis Has pain and limited range of motion with flexion extension lateral rotation and bending. Lateral bending seems to be the worse on the right side.  Neurological: She is alert and oriented to person, place, and time. She has normal strength. Gait normal.  Reflex Scores:      Tricep reflexes are 2+ on the right side and 2+ on the left side.      Bicep reflexes are 2+ on the right side and 2+ on the left side.      Brachioradialis reflexes are 2+ on the right side and 2+ on the left side.      Patellar reflexes are 2+ on the right side and 2+ on the left side.      Achilles reflexes are 2+ on the right side and 2+ on the left side. Decreased light touch sensation in both hands mainly in the median nerve distribution  Decreased sharp touch in the entire hand but sparing the dorsal web space and also the shoulder area Decreased sensation in both lower extremities L3-L4 L5-S1 dermatomes with relatives preservation of the left L3  Positive Tinel's at the wrist bilaterally  Reverse Phalen's, Negative    Psychiatric: She has a normal mood and affect.  Nursing note and vitals reviewed.  Cervical spine with full range of motion. Mild pain with cervical extension and flexion at end range.       Assessment & Plan:  #1. Chronic lumbar pain with sciatica. She has bilateral lower extremity symptoms.She complains of worsening with time. This has been going on for over a  year.No bowel or bladder symptoms however given the chronicity recommend MRI lumbar spine. If this shows signs of nerve root compression would recommend lumbar epidural injections. Based on symptoms appears that the L5 nerve root is the most effective. We'll correlate with MRI findings.  2. Chronic hand pain numbness and tingling. She has tried splints. This appears to be carpal tunnel  related. Do not think this is a cervical  Radiculopathy. We will revise the way her prednisone is prescribed. She will take prednisone 30 mg per day 2 days, 20 mg per day 2 days and 10 mg per day 2 days. If this is not helpful consider carpal tunnel injection, carpal tunnel ultrasound, if no improvement consider EMG/NCV

## 2014-11-22 ENCOUNTER — Ambulatory Visit: Payer: Self-pay | Admitting: Physical Medicine & Rehabilitation

## 2014-12-05 ENCOUNTER — Ambulatory Visit (HOSPITAL_COMMUNITY)
Admission: RE | Admit: 2014-12-05 | Discharge: 2014-12-05 | Disposition: A | Discharge status: Home / Self Care | Payer: Self-pay | Source: Ambulatory Visit | Attending: Physical Medicine & Rehabilitation | Admitting: Physical Medicine & Rehabilitation

## 2014-12-05 DIAGNOSIS — M545 Low back pain: Secondary | ICD-10-CM | POA: Insufficient documentation

## 2014-12-05 DIAGNOSIS — M5442 Lumbago with sciatica, left side: Secondary | ICD-10-CM

## 2014-12-05 DIAGNOSIS — M5441 Lumbago with sciatica, right side: Secondary | ICD-10-CM

## 2014-12-31 ENCOUNTER — Ambulatory Visit: Payer: Self-pay

## 2014-12-31 ENCOUNTER — Ambulatory Visit: Payer: Self-pay | Admitting: Physical Medicine & Rehabilitation

## 2015-01-07 ENCOUNTER — Telehealth: Payer: Self-pay | Admitting: Physical Medicine & Rehabilitation

## 2015-01-07 NOTE — Telephone Encounter (Signed)
-----   Message from Doreene Eland, RN sent at 01/01/2015  3:08 PM EDT ----- She has an appt scheduled for 01/20/15

## 2015-01-20 ENCOUNTER — Ambulatory Visit: Payer: No Typology Code available for payment source | Admitting: Physical Medicine & Rehabilitation

## 2015-01-20 ENCOUNTER — Encounter: Payer: No Typology Code available for payment source | Attending: Physical Medicine & Rehabilitation

## 2015-01-20 ENCOUNTER — Encounter: Payer: Self-pay | Admitting: Physical Medicine & Rehabilitation

## 2015-01-20 VITALS — BP 110/68 | HR 53

## 2015-01-20 DIAGNOSIS — M4310 Spondylolisthesis, site unspecified: Secondary | ICD-10-CM

## 2015-01-20 DIAGNOSIS — G5601 Carpal tunnel syndrome, right upper limb: Secondary | ICD-10-CM | POA: Insufficient documentation

## 2015-01-20 DIAGNOSIS — G5602 Carpal tunnel syndrome, left upper limb: Secondary | ICD-10-CM | POA: Insufficient documentation

## 2015-01-20 DIAGNOSIS — M5441 Lumbago with sciatica, right side: Secondary | ICD-10-CM | POA: Insufficient documentation

## 2015-01-20 DIAGNOSIS — G5603 Carpal tunnel syndrome, bilateral upper limbs: Secondary | ICD-10-CM

## 2015-01-20 DIAGNOSIS — Q762 Congenital spondylolisthesis: Secondary | ICD-10-CM

## 2015-01-20 DIAGNOSIS — M5442 Lumbago with sciatica, left side: Secondary | ICD-10-CM | POA: Insufficient documentation

## 2015-01-20 NOTE — Progress Notes (Signed)
54 year old female with bilateral hand pain and numbness in the fingers pain in the wrist.  Tried oral steroids without much relief.  Procedure: Bilateral carpal tunnel injection  Informed consent was obtained after scrubbing risk and benefits of procedure the patient is include bleeding bruising infection she likes proceed and has given written consent. Patient placed in seated position area just medial to the flexor carpi radialis tendon was marked and prepped with Betadine entered with a 30-gauge 1/2 inch needle 0.25 mL lidocaine 1% injected into each side then a separate needle 30-gauge 1/2 inch inserted and 0.25 mL of Celestone 6 mg per mL injected. Patient tolerated procedure well. Post procedure instructions given    We also discussed the results of her lumbar MRI. Evidence of grade 2 spondylolisthesis at L4-L5. Referral to orthopedic spine surgery at [facility]

## 2015-02-18 ENCOUNTER — Encounter: Payer: No Typology Code available for payment source | Attending: Physical Medicine & Rehabilitation

## 2015-02-18 ENCOUNTER — Ambulatory Visit (HOSPITAL_BASED_OUTPATIENT_CLINIC_OR_DEPARTMENT_OTHER): Payer: No Typology Code available for payment source | Admitting: Physical Medicine & Rehabilitation

## 2015-02-18 ENCOUNTER — Encounter: Payer: Self-pay | Admitting: Physical Medicine & Rehabilitation

## 2015-02-18 VITALS — BP 135/77 | HR 50

## 2015-02-18 DIAGNOSIS — M5442 Lumbago with sciatica, left side: Secondary | ICD-10-CM | POA: Insufficient documentation

## 2015-02-18 DIAGNOSIS — M5417 Radiculopathy, lumbosacral region: Secondary | ICD-10-CM

## 2015-02-18 DIAGNOSIS — G5602 Carpal tunnel syndrome, left upper limb: Secondary | ICD-10-CM | POA: Insufficient documentation

## 2015-02-18 DIAGNOSIS — M5441 Lumbago with sciatica, right side: Secondary | ICD-10-CM | POA: Insufficient documentation

## 2015-02-18 DIAGNOSIS — M5416 Radiculopathy, lumbar region: Secondary | ICD-10-CM

## 2015-02-18 DIAGNOSIS — G5601 Carpal tunnel syndrome, right upper limb: Secondary | ICD-10-CM | POA: Insufficient documentation

## 2015-02-18 NOTE — Progress Notes (Signed)
Right L4-5 Lumbar transforaminal epidural steroid injection under fluoroscopic guidance  Indication: Lumbosacral radiculitis is not relieved by medication management or other conservative care and interfering with self-care and mobility.   Informed consent was obtained after describing risk and benefits of the procedure with the patient, this includes bleeding, bruising, infection, paralysis and medication side effects.  The patient wishes to proceed and has given written consent.  Patient was placed in prone position.  The lumbar area was marked and prepped with Betadine.  It was entered with a 25-gauge 1-1/2 inch needle and one mL of 1% lidocaine was injected into the skin and subcutaneous tissue.  Then a 22-gauge 3.5 spinal needle was inserted into the Right L4-5 intervertebral foramen under AP, lateral, and oblique view.  Then a solution containing one mL of 10 mg per mL dexamethasone and 2 mL of 1% lidocaine was injected.  The patient tolerated procedure well.  Post procedure instructions were given.  Please see post procedure form.

## 2015-02-18 NOTE — Progress Notes (Signed)
  PROCEDURE RECORD [facility]   Name: Rachel Hudson DOB:12/30/1960 MRN: [id number]  Date:02/18/2015  Physician: Claudette Laws, MD    Nurse/CMA: Troutman CMA/ Shumaker RN  Allergies:  Allergies  Allergen Reactions  . Penicillins     Facial swelling  . Tylenol [Acetaminophen] Other (See Comments)    HX of Hep. C    Consent Signed: Yes.    Is patient diabetic? No.  CBG today?   Pregnant: No. LMP: Patient's last menstrual period was 02/10/2015. (age 70-55)   Anticoagulants: no Anti-inflammatory: no Antibiotics: no  Procedure: right lumbar 4-5 transforaminal epidural steroid injection Position: Prone Start Time: 1:10 End Time: 1:14  Fluoro Time: 22 sec  RN/CMA Copy CMA    Time 12:39 1:20    BP 135/77 160/82    Pulse 50 53    Respirations 16 16    O2 Sat 99 100    S/S 6 6    Pain Level 10/10 Pt Unable to determine yet     D/C home with Chrissie Noa, patient A & O X 3, D/C instructions reviewed, and sits independently.

## 2015-02-18 NOTE — Patient Instructions (Signed)

## 2015-02-20 ENCOUNTER — Ambulatory Visit: Payer: Self-pay

## 2015-03-17 ENCOUNTER — Ambulatory Visit: Payer: Self-pay | Attending: Internal Medicine

## 2015-03-21 ENCOUNTER — Encounter: Payer: Self-pay | Attending: Physical Medicine & Rehabilitation

## 2015-03-21 ENCOUNTER — Ambulatory Visit (HOSPITAL_BASED_OUTPATIENT_CLINIC_OR_DEPARTMENT_OTHER): Payer: Self-pay | Admitting: Physical Medicine & Rehabilitation

## 2015-03-21 ENCOUNTER — Encounter: Payer: Self-pay | Admitting: Physical Medicine & Rehabilitation

## 2015-03-21 VITALS — BP 146/85 | HR 55 | Resp 14

## 2015-03-21 DIAGNOSIS — G5601 Carpal tunnel syndrome, right upper limb: Secondary | ICD-10-CM | POA: Insufficient documentation

## 2015-03-21 DIAGNOSIS — G5602 Carpal tunnel syndrome, left upper limb: Secondary | ICD-10-CM | POA: Insufficient documentation

## 2015-03-21 DIAGNOSIS — M5441 Lumbago with sciatica, right side: Secondary | ICD-10-CM | POA: Insufficient documentation

## 2015-03-21 DIAGNOSIS — M5442 Lumbago with sciatica, left side: Secondary | ICD-10-CM | POA: Insufficient documentation

## 2015-03-21 DIAGNOSIS — M47816 Spondylosis without myelopathy or radiculopathy, lumbar region: Secondary | ICD-10-CM | POA: Insufficient documentation

## 2015-03-21 NOTE — Patient Instructions (Addendum)
  Lumbar medial branch blocks were performed. This is to help diagnose the cause of the low back pain. It is important that you keep track of your pain for the first day or 2 after injection. This injection can give you temporary relief that lasts for hours or up to several months. There is no way to predict duration of pain relief.  Please try to compare your pain after injection to for the injection.  If this injection gives you  temporary relief there may be another longer-lasting procedure that may be beneficial call radiofrequency ablation   If we get a good response with this injection and it wears off we will need to do another one but I would recommend using Valium to relax you prior to the procedure

## 2015-03-21 NOTE — Progress Notes (Signed)
  PROCEDURE RECORD [facility]   Name: [name] DOB:08/13/1960 MRN: [id number]  Date:03/21/2015  Physician: Claudette LawsAndrew Kirsteins, MD    Nurse/CMA: Purvis SheffieldKen Kortlynn Poust   Allergies:  Allergies  Allergen Reactions  . Penicillins     Facial swelling  . Tylenol [Acetaminophen] Other (See Comments)    HX of Hep. C    Consent Signed: Yes.    Is patient diabetic? No.  CBG today?   Pregnant: No. LMP: No LMP recorded. (age 53-55)  Anticoagulants: no Anti-inflammatory: no Antibiotics: no  Procedure: bilateral medial branch block  Position: Prone Start Time: 10:26am End Time: 10:39am  Fluoro Time: 47   RN/CMA Teresa CoombsKen Jilda Kress Ken Jayziah Bankhead    Time 10:10am     BP 146/85 160/82    Pulse 55 55    Respirations 14 14    O2 Sat 98 99    S/S 6 6    Pain Level 10/10 1/10     D/C home with Mauri ReadingWilliam Hurdle, patient A & O X 3, D/C instructions reviewed, and sits independently.

## 2015-03-21 NOTE — Progress Notes (Signed)
Bilateral Lumbar L3, L4  medial branch blocks and L 5 dorsal ramus injection under fluoroscopic guidance, Evidence of lumbar spondylosis as well as mild spondylolisthesis L4-L5 and L5-S1. Toe pain on the right improved after right L4-5 epidural injection primary complaint today is low back pain.    Indication: Lumbar pain which is not relieved by medication management or other conservative care and interfering with self-care and mobility.  Informed consent was obtained after describing risks and benefits of the procedure with the patient, this includes bleeding, infection, paralysis and medication side effects.  The patient wishes to proceed and has given written consent.  The patient was placed in prone position.  The lumbar area was marked and prepped with Betadine.  One mL of 1% lidocaine was injected into each of 6 areas into the skin and subcutaneous tissue.  Then a 22-gauge 3.5 inch spinal needle was inserted targeting the junction of the left S1 superior articular process and sacral ala junction. Needle was advanced under fluoroscopic guidance.  Bone contact was made.  Omnipaque 180 was injected x 0.5 mL demonstrating no intravascular uptake.  Then a solution containing one mL of 4 mg per mL dexamethasone and 3 mL of 2% MPF lidocaine was injected x 0.5 mL.  Then the left L5 superior articular process in transverse process junction was targeted.  Bone contact was made.  Omnipaque 180 was injected x 0.5 mL demonstrating no intravascular uptake. Then a solution containing one mL of 4 mg per mL dexamethasone and 3 mL of 2% MPF lidocaine was injected x 0.5 mL.  Then the left L4 superior articular process in transverse process junction was targeted.  Bone contact was made.  Omnipaque 180 was injected x 0.5 mL demonstrating no intravascular uptake.  Then a solution containing one mL of 4 mg per mL dexamethasone and 3 mL if 2% MPF lidocaine was injected x 0.5 mL.  This same procedure was performed on the right  side using the same needle, technique and injectate.  Patient tolerated procedure well.  Post procedure instructions were given.

## 2015-04-21 ENCOUNTER — Ambulatory Visit: Payer: Self-pay | Attending: Family Medicine

## 2015-04-25 ENCOUNTER — Ambulatory Visit: Payer: Self-pay | Admitting: Physical Medicine & Rehabilitation

## 2015-04-25 ENCOUNTER — Emergency Department (HOSPITAL_COMMUNITY): Payer: Self-pay

## 2015-04-25 ENCOUNTER — Inpatient Hospital Stay (HOSPITAL_COMMUNITY)
Admission: EM | Admit: 2015-04-25 | Discharge: 2015-05-04 | DRG: 759 | Disposition: A | Discharge status: Home Health | Payer: Self-pay | Attending: Obstetrics and Gynecology | Admitting: Obstetrics and Gynecology

## 2015-04-25 ENCOUNTER — Encounter (HOSPITAL_COMMUNITY): Payer: Self-pay | Admitting: Emergency Medicine

## 2015-04-25 DIAGNOSIS — K7460 Unspecified cirrhosis of liver: Secondary | ICD-10-CM | POA: Diagnosis present

## 2015-04-25 DIAGNOSIS — Z8619 Personal history of other infectious and parasitic diseases: Secondary | ICD-10-CM

## 2015-04-25 DIAGNOSIS — Z880 Allergy status to penicillin: Secondary | ICD-10-CM

## 2015-04-25 DIAGNOSIS — N7011 Chronic salpingitis: Secondary | ICD-10-CM

## 2015-04-25 DIAGNOSIS — N730 Acute parametritis and pelvic cellulitis: Principal | ICD-10-CM

## 2015-04-25 DIAGNOSIS — F1290 Cannabis use, unspecified, uncomplicated: Secondary | ICD-10-CM

## 2015-04-25 DIAGNOSIS — Z886 Allergy status to analgesic agent status: Secondary | ICD-10-CM

## 2015-04-25 DIAGNOSIS — I10 Essential (primary) hypertension: Secondary | ICD-10-CM

## 2015-04-25 DIAGNOSIS — N7093 Salpingitis and oophoritis, unspecified: Secondary | ICD-10-CM | POA: Diagnosis present

## 2015-04-25 LAB — CBC WITH DIFFERENTIAL/PLATELET
BASOS PCT: 0 %
Basophils Absolute: 0 10*3/uL (ref 0.0–0.1)
EOS PCT: 0 %
Eosinophils Absolute: 0 10*3/uL (ref 0.0–0.7)
HEMATOCRIT: 39.1 % (ref 36.0–46.0)
HEMOGLOBIN: 13.7 g/dL (ref 12.0–15.0)
LYMPHS PCT: 4 %
Lymphs Abs: 1.1 10*3/uL (ref 0.7–4.0)
MCH: 30.6 pg (ref 26.0–34.0)
MCHC: 35 g/dL (ref 30.0–36.0)
MCV: 87.3 fL (ref 78.0–100.0)
MONOS PCT: 3 %
Monocytes Absolute: 0.8 10*3/uL (ref 0.1–1.0)
NEUTROS ABS: 25.2 10*3/uL — AB (ref 1.7–7.7)
Neutrophils Relative %: 93 %
Platelets: 204 10*3/uL (ref 150–400)
RBC: 4.48 MIL/uL (ref 3.87–5.11)
RDW: 12.9 % (ref 11.5–15.5)
WBC Morphology: INCREASED
WBC: 27.1 10*3/uL — ABNORMAL HIGH (ref 4.0–10.5)

## 2015-04-25 LAB — URINALYSIS, ROUTINE W REFLEX MICROSCOPIC
Glucose, UA: NEGATIVE mg/dL
Ketones, ur: 15 mg/dL — AB
Nitrite: POSITIVE — AB
PH: 6 (ref 5.0–8.0)
Protein, ur: 30 mg/dL — AB
Specific Gravity, Urine: 1.026 (ref 1.005–1.030)

## 2015-04-25 LAB — COMPREHENSIVE METABOLIC PANEL
ALK PHOS: 63 U/L (ref 38–126)
ALT: 12 U/L — ABNORMAL LOW (ref 14–54)
ANION GAP: 15 (ref 5–15)
AST: 24 U/L (ref 15–41)
Albumin: 3.3 g/dL — ABNORMAL LOW (ref 3.5–5.0)
BILIRUBIN TOTAL: 1.8 mg/dL — AB (ref 0.3–1.2)
BUN: 9 mg/dL (ref 6–20)
CALCIUM: 8.7 mg/dL — AB (ref 8.9–10.3)
CO2: 20 mmol/L — ABNORMAL LOW (ref 22–32)
Chloride: 100 mmol/L — ABNORMAL LOW (ref 101–111)
Creatinine, Ser: 0.75 mg/dL (ref 0.44–1.00)
GFR calc non Af Amer: >60 mL/min (ref >60)
GLUCOSE: 136 mg/dL — AB (ref 65–99)
Potassium: 3.6 mmol/L (ref 3.5–5.1)
Sodium: 135 mmol/L (ref 135–145)
TOTAL PROTEIN: 7.3 g/dL (ref 6.5–8.1)

## 2015-04-25 LAB — LIPASE, BLOOD: Lipase: 15 U/L (ref 11–51)

## 2015-04-25 LAB — WET PREP, GENITAL
Sperm: NONE SEEN
Trich, Wet Prep: NONE SEEN
Yeast Wet Prep HPF POC: NONE SEEN

## 2015-04-25 LAB — URINE MICROSCOPIC-ADD ON

## 2015-04-25 LAB — POC URINE PREG, ED: Preg Test, Ur: NEGATIVE

## 2015-04-25 LAB — I-STAT CG4 LACTIC ACID, ED: Lactic Acid, Venous: 1.77 mmol/L (ref 0.5–2.0)

## 2015-04-25 MED ORDER — ONDANSETRON 4 MG PO TBDP: 8.0000 mg | ORAL_TABLET | Freq: Once | ORAL | Status: DC

## 2015-04-25 MED ORDER — PRENATAL MULTIVITAMIN CH
1.0000 | ORAL_TABLET | Freq: Every day | ORAL | Status: DC
  Administered 2015-04-26 – 2015-05-03 (×6): 1 via ORAL
  Filled 2015-04-25 (×7): qty 1

## 2015-04-25 MED ORDER — ALUM & MAG HYDROXIDE-SIMETH 200-200-20 MG/5ML PO SUSP
30.0000 mL | ORAL | Status: DC | PRN
Start: 2015-04-25 — End: 2015-05-04

## 2015-04-25 MED ORDER — HYDROMORPHONE HCL 1 MG/ML IJ SOLN
1.0000 mg | Freq: Once | INTRAMUSCULAR | Status: AC
  Administered 2015-04-25: 1 mg via INTRAVENOUS
  Filled 2015-04-25: qty 1

## 2015-04-25 MED ORDER — ZOLPIDEM TARTRATE 5 MG PO TABS
5.0000 mg | ORAL_TABLET | Freq: Every evening | ORAL | Status: DC | PRN
  Administered 2015-04-30 – 2015-05-01 (×2): 5 mg via ORAL
  Filled 2015-04-25 (×2): qty 1

## 2015-04-25 MED ORDER — ENOXAPARIN SODIUM 40 MG/0.4ML ~~LOC~~ SOLN
40.0000 mg | SUBCUTANEOUS | Status: DC
  Administered 2015-04-25 – 2015-05-04 (×9): 40 mg via SUBCUTANEOUS
  Filled 2015-04-25 (×9): qty 0.4

## 2015-04-25 MED ORDER — LACTATED RINGERS IV SOLN
INTRAVENOUS | Status: DC
  Administered 2015-04-25 – 2015-05-02 (×12): via INTRAVENOUS

## 2015-04-25 MED ORDER — ONDANSETRON HCL 4 MG/2ML IJ SOLN
4.0000 mg | Freq: Four times a day (QID) | INTRAMUSCULAR | Status: DC | PRN
  Administered 2015-04-27 – 2015-05-02 (×6): 4 mg via INTRAVENOUS
  Filled 2015-04-25 (×6): qty 2

## 2015-04-25 MED ORDER — IOHEXOL 300 MG/ML  SOLN
100.0000 mL | Freq: Once | INTRAMUSCULAR | Status: AC | PRN
  Administered 2015-04-25: 100 mL via INTRAVENOUS

## 2015-04-25 MED ORDER — ONDANSETRON HCL 4 MG/2ML IJ SOLN
4.0000 mg | Freq: Once | INTRAMUSCULAR | Status: AC
  Administered 2015-04-25: 4 mg via INTRAVENOUS
  Filled 2015-04-25: qty 2

## 2015-04-25 MED ORDER — POLYETHYLENE GLYCOL 3350 17 G PO PACK: 17.0000 g | PACK | Freq: Every day | ORAL | Status: DC | PRN

## 2015-04-25 MED ORDER — CLINDAMYCIN PHOSPHATE 900 MG/50ML IV SOLN
900.0000 mg | Freq: Once | INTRAVENOUS | Status: AC
  Administered 2015-04-25: 900 mg via INTRAVENOUS
  Filled 2015-04-25: qty 50

## 2015-04-25 MED ORDER — HYDROMORPHONE HCL 1 MG/ML IJ SOLN
1.0000 mg | INTRAMUSCULAR | Status: DC | PRN
  Administered 2015-04-25: 1 mg via INTRAVENOUS
  Filled 2015-04-25: qty 1

## 2015-04-25 MED ORDER — HYDROMORPHONE HCL 1 MG/ML IJ SOLN
1.0000 mg | INTRAMUSCULAR | Status: DC | PRN
  Administered 2015-04-26 – 2015-04-30 (×3): 1 mg via INTRAVENOUS
  Filled 2015-04-25 (×3): qty 1

## 2015-04-25 MED ORDER — GENTAMICIN SULFATE 40 MG/ML IJ SOLN
7.0000 mg/kg | INTRAVENOUS | Status: DC
  Administered 2015-04-25 – 2015-04-30 (×6): 400 mg via INTRAVENOUS
  Filled 2015-04-25 (×6): qty 10

## 2015-04-25 MED ORDER — TRAMADOL HCL 50 MG PO TABS
50.0000 mg | ORAL_TABLET | Freq: Four times a day (QID) | ORAL | Status: DC | PRN
  Administered 2015-04-26 – 2015-05-04 (×6): 50 mg via ORAL
  Filled 2015-04-25 (×6): qty 1

## 2015-04-25 MED ORDER — IBUPROFEN 600 MG PO TABS
600.0000 mg | ORAL_TABLET | Freq: Four times a day (QID) | ORAL | Status: DC | PRN
  Administered 2015-04-26 – 2015-05-04 (×10): 600 mg via ORAL
  Filled 2015-04-25 (×10): qty 1

## 2015-04-25 MED ORDER — FENTANYL CITRATE (PF) 100 MCG/2ML IJ SOLN
50.0000 ug | Freq: Once | INTRAMUSCULAR | Status: AC
  Administered 2015-04-25: 50 ug via INTRAVENOUS
  Filled 2015-04-25: qty 2

## 2015-04-25 MED ORDER — ONDANSETRON HCL 4 MG PO TABS
4.0000 mg | ORAL_TABLET | Freq: Four times a day (QID) | ORAL | Status: DC | PRN
  Administered 2015-04-26 – 2015-04-29 (×2): 4 mg via ORAL
  Filled 2015-04-25 (×3): qty 1

## 2015-04-25 NOTE — ED Notes (Signed)
Pt arrives with c/o generalized pain ongoing x2 days. States she's got "female problems real bad." States cannot pass gas, however last bowel movement 20 minutes ago. Nausea/vomiting, no diarrhea.

## 2015-04-25 NOTE — ED Notes (Signed)
Family at bedside. 

## 2015-04-25 NOTE — Progress Notes (Signed)
ANTIBIOTIC CONSULT NOTE - INITIAL  Pharmacy Consult for Gentamicin  Indication: Pelvic Inflammatory Disease   Allergies  Allergen Reactions  . Penicillins     Facial swelling  . Tylenol [Acetaminophen] Other (See Comments)    HX of Hep. C    Patient Measurements: Height: 5\' 1"  (154.9 cm) Weight: 160 lb (72.576 kg) IBW/kg (Calculated) : 47.8 Adjusted Body Weight: 57 kg   Vital Signs: Temp: 98.1 F (36.7 C) (12/02 0952) Temp Source: Oral (12/02 0952) BP: 136/84 mmHg (12/02 0952) Pulse Rate: 81 (12/02 0952) Intake/Output from previous day:   Intake/Output from this shift:    Labs:  Recent Labs  04/25/15 0640  WBC 27.1*  HGB 13.7  PLT 204  CREATININE 0.75   Estimated Creatinine Clearance: 74.1 mL/min (by C-G formula based on Cr of 0.75). No results for input(s): VANCOTROUGH, VANCOPEAK, VANCORANDOM, GENTTROUGH, GENTPEAK, GENTRANDOM, TOBRATROUGH, TOBRAPEAK, TOBRARND, AMIKACINPEAK, AMIKACINTROU, AMIKACIN in the last 72 hours.   Microbiology: Recent Results (from the past 720 hour(s))  Wet prep, genital     Status: Abnormal   Collection Time: 04/25/15  1:15 PM  Result Value Ref Range Status   Yeast Wet Prep HPF POC NONE SEEN NONE SEEN Final   Trich, Wet Prep NONE SEEN NONE SEEN Final   Clue Cells Wet Prep HPF POC PRESENT (A) NONE SEEN Final   WBC, Wet Prep HPF POC FEW (A) NONE SEEN Final   Sperm NONE SEEN  Final    Medical History: Past Medical History  Diagnosis Date  . Hypertension   . Hep C w/o coma, chronic (HCC) As of 10/22/13  . Cirrhosis of liver [facility](HCC) September 2015    Stage 4    Medications:   (Not in a hospital admission) Assessment: [id number] YOF who presented with generalized pain x 2 days and N/V. Pharmacy consulted to start IV gentamicin for pelvic inflammatory disease. WBC elevated 27.1. Tm 100 F. CrCl ~ 74 mL/min   Goal of Therapy:  Gentamicin trough level <2 mcg/ml  Plan:  -Gentamicin 7 mg/kg Q 24 hours (EI dosing) -F/u gentamicin level 10  hours after start of infusion  -Monitor CBC, renal fx, cultures and clinical progress   Vinnie LevelBenjamin Reyden Smith, PharmD., BCPS Clinical Pharmacist Pager [phone]

## 2015-04-25 NOTE — ED Notes (Signed)
Report called to Irving BurtonEmily, RN on Women's unit -- [phone]

## 2015-04-25 NOTE — H&P (Signed)
Rachel CottaFrances M Hudson is an 54 y.o. female. .G0pP0 LMP began 12/1,She is admitted for Acute PID, with CT noted for bilateral hydrosalpinges, the right 4.3x 3.2 cm diameter, and surrounding pelvic fluid.  She is admitted in transfer from [facility] where she presented @ 6 am Friday morning after 24 hours of menses, and associated anorexia , nausea and occasional vomiting.  Lab eval there notatble for CT dx of PID with bilateral hydrosllpinges, as well as Clue cells on wet prep.   Pertinent Gynecological History: Menses: flow is moderate Bleeding: menses regular this is the heaviest month in years Contraception: none G0P0 DES exposure: unknown Blood transfusions: none Sexually transmitted diseases: no past history Previous GYN Procedures:   Last mammogram: NONE ON RECORD Date:  Last pap: NONE ON RECORD; PT DENIES GYN VISIT IN MANY YEARS Date:  OB History: G0, P0   Menstrual History: Menarche age:  Patient's last menstrual period was 04/24/2015 (exact date).    Past Medical History  Diagnosis Date  . Hypertension   . Hep C w/o coma, chronic (HCC) As of 10/22/13  . 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 Ligament repair Right 1980    Knee    Family History  Problem Relation Age of Onset  . Adopted: Yes  . Family history unknown: Yes    Social History:  reports that she has never smoked. She has never used smokeless tobacco. She reports that she uses illicit drugs (Marijuana) about 7 times per week. She reports that she does not drink alcohol. PT  Has no children, never pregnant, stable relationship, one partner x 20+years. Remains sexually active , is confident he is failthful  Allergies:  Allergies  Allergen Reactions  . Penicillins     Facial swelling  . Tylenol [Acetaminophen] Other (See Comments)    HX of Hep. C    Prescriptions prior to admission  Medication Sig Dispense Refill Last Dose  . amLODipine  (NORVASC) 10 MG tablet Take 10 mg by mouth daily.   Past Week at Unknown time  . gabapentin (NEURONTIN) 800 MG tablet Take 1,200 mg by mouth 3 (three) times daily.   Past Week at Unknown time  . nortriptyline (PAMELOR) 25 MG capsule Take 25 mg by mouth at bedtime.   Past Week at Unknown time  . Omega-3 Fatty Acids (FISH OIL PO) Take 1-2 capsules by mouth.   Past Week at Unknown time    ROS Pt DESCRIBES MENSES AS ALWAYS PAINFUL ON LEFT SIDE.X YEARS.  Blood pressure 131/78, pulse 95, temperature 98.7 F (37.1 C), temperature source Oral, resp. rate 20, height 5\' 1"  (1.549 m), weight 69.429 kg (153 lb 1 oz), last menstrual period 04/24/2015, SpO2 95 %. Physical Exam  Constitutional: She appears well-developed and well-nourished.  HENT:  Head: Normocephalic.  Eyes: Pupils are equal, round, and reactive to light.  Neck: Normal range of motion.  Cardiovascular: Normal rate.   Respiratory: Effort normal.  GI: Soft. Bowel sounds are normal. She exhibits no mass. There is tenderness. There is rebound and guarding.  Genitourinary:  Pelvic per [facility] at [facility]    See CT report Results for orders placed or performed during the hospital encounter of 04/25/15 (from the past 24 hour(s))  CBC with Differential/Platelet     Status: Abnormal   Collection Time: 04/25/15  6:40 AM  Result Value Ref Range   WBC 27.1 (H) 4.0 -  10.5 K/uL   RBC 4.48 3.87 - 5.11 MIL/uL   Hemoglobin 13.7 12.0 - 15.0 g/dL   HCT 78.2 95.6 - 21.3 %   MCV 87.3 78.0 - 100.0 fL   MCH 30.6 26.0 - 34.0 pg   MCHC 35.0 30.0 - 36.0 g/dL   RDW 08.6 57.8 - 46.9 %   Platelets 204 150 - 400 K/uL   Neutrophils Relative % 93 %   Lymphocytes Relative 4 %   Monocytes Relative 3 %   Eosinophils Relative 0 %   Basophils Relative 0 %   Neutro Abs 25.2 (H) 1.7 - 7.7 K/uL   Lymphs Abs 1.1 0.7 - 4.0 K/uL   Monocytes Absolute 0.8 0.1 - 1.0 K/uL   Eosinophils Absolute 0.0 0.0 - 0.7 K/uL   Basophils Absolute 0.0 0.0 - 0.1 K/uL   WBC  Morphology INCREASED BANDS (>20% BANDS)   Comprehensive metabolic panel     Status: Abnormal   Collection Time: 04/25/15  6:40 AM  Result Value Ref Range   Sodium 135 135 - 145 mmol/L   Potassium 3.6 3.5 - 5.1 mmol/L   Chloride 100 (L) 101 - 111 mmol/L   CO2 20 (L) 22 - 32 mmol/L   Glucose, Bld 136 (H) 65 - 99 mg/dL   BUN 9 6 - 20 mg/dL   Creatinine, Ser 6.29 0.44 - 1.00 mg/dL   Calcium 8.7 (L) 8.9 - 10.3 mg/dL   Total Protein 7.3 6.5 - 8.1 g/dL   Albumin 3.3 (L) 3.5 - 5.0 g/dL   AST 24 15 - 41 U/L   ALT 12 (L) 14 - 54 U/L   Alkaline Phosphatase 63 38 - 126 U/L   Total Bilirubin 1.8 (H) 0.3 - 1.2 mg/dL   GFR calc non Af Amer >60 >60 mL/min   GFR calc Af Amer >60 >60 mL/min   Anion gap 15 5 - 15  Lipase, blood     Status: None   Collection Time: 04/25/15  6:40 AM  Result Value Ref Range   Lipase 15 11 - 51 U/L  Urinalysis, Routine w reflex microscopic (not at [facility])     Status: Abnormal   Collection Time: 04/25/15 11:00 AM  Result Value Ref Range   Color, Urine ORANGE (A) YELLOW   APPearance CLOUDY (A) CLEAR   Specific Gravity, Urine 1.026 1.005 - 1.030   pH 6.0 5.0 - 8.0   Glucose, UA NEGATIVE NEGATIVE mg/dL   Hgb urine dipstick LARGE (A) NEGATIVE   Bilirubin Urine SMALL (A) NEGATIVE   Ketones, ur 15 (A) NEGATIVE mg/dL   Protein, ur 30 (A) NEGATIVE mg/dL   Nitrite POSITIVE (A) NEGATIVE   Leukocytes, UA SMALL (A) NEGATIVE  Urine microscopic-add on     Status: Abnormal   Collection Time: 04/25/15 11:00 AM  Result Value Ref Range   Squamous Epithelial / LPF 0-5 (A) NONE SEEN   WBC, UA 6-30 0 - 5 WBC/hpf   RBC / HPF TOO NUMEROUS TO COUNT 0 - 5 RBC/hpf   Bacteria, UA FEW (A) NONE SEEN   Urine-Other MUCOUS PRESENT   POC urine preg, ED (not at [facility])     Status: None   Collection Time: 04/25/15 11:17 AM  Result Value Ref Range   Preg Test, Ur NEGATIVE NEGATIVE  Wet prep, genital     Status: Abnormal   Collection Time: 04/25/15  1:15 PM  Result Value Ref Range   Yeast Wet  Prep HPF POC NONE SEEN NONE SEEN  Trich, Wet Prep NONE SEEN NONE SEEN   Clue Cells Wet Prep HPF POC PRESENT (A) NONE SEEN   WBC, Wet Prep HPF POC FEW (A) NONE SEEN   Sperm NONE SEEN   I-Stat CG4 Lactic Acid, ED     Status: None   Collection Time: 04/25/15  1:41 PM  Result Value Ref Range   Lactic Acid, Venous 1.77 0.5 - 2.0 mmol/L    Dg Abd 1 View  04/25/2015  CLINICAL DATA:  Acute epigastric abdominal pain. EXAM: ABDOMEN - 1 VIEW COMPARISON:  None. FINDINGS: The bowel gas pattern is normal. No radio-opaque calculi or other significant radiographic abnormality are seen. IMPRESSION: No evidence of bowel obstruction or ileus. Electronically Signed   By: Lupita Raider, M.D.   On: 04/25/2015 08:54   Ct Abdomen Pelvis W Contrast  04/25/2015  CLINICAL DATA:  Acute generalized abdominal pain and distention. EXAM: CT ABDOMEN AND PELVIS WITH CONTRAST TECHNIQUE: Multidetector CT imaging of the abdomen and pelvis was performed using the standard protocol following bolus administration of intravenous contrast. CONTRAST:  OMNIPAQUE IOHEXOL 300 MG/ML  SOLN COMPARISON:  None. 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 Visualized lung bases are unremarkable. No gallstones are noted. Lobular hepatic margins are again noted consistent with hepatic cirrhosis. No focal abnormality is noted in the liver. The spleen and pancreas appear normal. Adrenal glands and kidneys appear normal. No hydronephrosis or renal obstruction is noted. There is no evidence of abdominal aortic aneurysm. The appendix appears normal. There is no evidence of bowel obstruction. Mild adenopathy is noted in porta hepatis region most likely related to hepatic cirrhosis. Probable 3.6 cm fibroid is seen arising from the uterine fundus. Bilateral hydrosalpinges are noted with surrounding free fluid suggesting pelvic inflammatory disease. Urinary  bladder is decompressed. IMPRESSION: Hepatic cirrhosis is noted. 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 Probable 3.6 cm uterine fibroid is seen. Bilateral hydrosalpinx is noted in the pelvis with surrounding fluid suggesting pelvic inflammatory disease. Electronically Signed   By: Lupita Raider, M.D.   On: 04/25/2015 12:45    Assessment/Plan: Acute PID, likely on top of chronic disease  PpLAN: ADMIT CONTINUE IV GENT/CLEOCIN               PELVIC U/S IN A.M.               REPEAT CBC IN AM  Cybele Maule V   04/25/2015, 10:13 PM

## 2015-04-25 NOTE — ED Notes (Signed)
Pt states that she is still having periods regularly, monthly, this one is different. Much heavier, with clots.

## 2015-04-25 NOTE — ED Notes (Signed)
[facility] called One hour ETA

## 2015-04-25 NOTE — ED Notes (Signed)
Pt has been made aware that she needs to provide urine specimen. Actively refused to provide in triage.

## 2015-04-26 ENCOUNTER — Inpatient Hospital Stay (HOSPITAL_COMMUNITY): Payer: Self-pay

## 2015-04-26 LAB — CBC WITH DIFFERENTIAL/PLATELET
BASOS ABS: 0 10*3/uL (ref 0.0–0.1)
Basophils Relative: 0 %
EOS ABS: 0 10*3/uL (ref 0.0–0.7)
EOS PCT: 0 %
HCT: 31 % — ABNORMAL LOW (ref 36.0–46.0)
HEMOGLOBIN: 11 g/dL — AB (ref 12.0–15.0)
LYMPHS PCT: 6 %
Lymphs Abs: 1.1 10*3/uL (ref 0.7–4.0)
MCH: 30.6 pg (ref 26.0–34.0)
MCHC: 35.5 g/dL (ref 30.0–36.0)
MCV: 86.1 fL (ref 78.0–100.0)
Monocytes Absolute: 1.1 10*3/uL — ABNORMAL HIGH (ref 0.1–1.0)
Monocytes Relative: 6 %
NEUTROS PCT: 88 %
Neutro Abs: 17 10*3/uL — ABNORMAL HIGH (ref 1.7–7.7)
PLATELETS: 160 10*3/uL (ref 150–400)
RBC: 3.6 MIL/uL — AB (ref 3.87–5.11)
RDW: 13.2 % (ref 11.5–15.5)
WBC: 19.2 10*3/uL — AB (ref 4.0–10.5)

## 2015-04-26 LAB — GENTAMICIN LEVEL, RANDOM: GENTAMICIN RM: 1.3 ug/mL

## 2015-04-26 LAB — SYPHILIS: RPR W/REFLEX TO RPR TITER AND TREPONEMAL ANTIBODIES, TRADITIONAL SCREENING AND DIAGNOSIS ALGORITHM: RPR Ser Ql: NONREACTIVE

## 2015-04-26 LAB — HIV ANTIBODY (ROUTINE TESTING W REFLEX): HIV Screen 4th Generation wRfx: NONREACTIVE

## 2015-04-26 MED ORDER — HYDROMORPHONE HCL 2 MG PO TABS
2.0000 mg | ORAL_TABLET | ORAL | Status: DC | PRN
  Administered 2015-04-26 – 2015-04-28 (×7): 2 mg via ORAL
  Administered 2015-04-28: 4 mg via ORAL
  Administered 2015-04-28 (×2): 2 mg via ORAL
  Administered 2015-04-29 – 2015-05-01 (×5): 4 mg via ORAL
  Administered 2015-05-02: 2 mg via ORAL
  Administered 2015-05-02: 4 mg via ORAL
  Administered 2015-05-03 (×3): 2 mg via ORAL
  Filled 2015-04-26 (×2): qty 1
  Filled 2015-04-26 (×2): qty 2
  Filled 2015-04-26: qty 1
  Filled 2015-04-26: qty 2
  Filled 2015-04-26 (×4): qty 1
  Filled 2015-04-26: qty 2
  Filled 2015-04-26: qty 1
  Filled 2015-04-26: qty 2
  Filled 2015-04-26 (×3): qty 1
  Filled 2015-04-26: qty 2
  Filled 2015-04-26: qty 1
  Filled 2015-04-26: qty 2
  Filled 2015-04-26 (×3): qty 1

## 2015-04-26 MED ORDER — CLINDAMYCIN PHOSPHATE 900 MG/50ML IV SOLN
900.0000 mg | Freq: Three times a day (TID) | INTRAVENOUS | Status: DC
  Administered 2015-04-26 – 2015-04-30 (×14): 900 mg via INTRAVENOUS
  Filled 2015-04-26 (×16): qty 50

## 2015-04-26 NOTE — ED Provider Notes (Signed)
CSN: [id number]     Arrival date & time 04/25/15  [id number] History   First MD Initiated Contact with Patient 04/25/15 [phone]     Chief Complaint  Patient presents with  . Abdominal Pain  . Vaginal Bleeding     (Consider location/radiation/quality/duration/timing/severity/associated sxs/prior Treatment) HPI   Rachel Hudson is a 54 y.o F with a pmhx of chronic hepatitis C, stage IV cirrhosis, HTN who presents to the emergency department today complaining of abdominal pain. Patient states that 2 days ago she began having lower abdominal pain that is progressively worsened and is now diffusely located throughout her abdomen. Patient unable to get comfortable due to the pain. Patient did not take anything at home for relief. Patient still has regular menstrual cycles every 28 days and began her menses 2 days ago. Patient states that her menstrual cycle is much heavier than normal. Patient states that she has admits pain if she inserts a digit into her vaginal canal however, denies dyspareunia. Denies fever, chills, vomiting, diarrhea, melena, hematochezia, dysuria, vaginal discharge. Patient is sexually active with one partner. Does not use contraception.   Past Medical History  Diagnosis Date  . Hypertension   . Hep C w/o coma, chronic (HCC) As of 10/22/13  . 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 Ligament repair Right 1980    Knee   Family History  Problem Relation Age of Onset  . Adopted: Yes  . Family history unknown: Yes   Social History  Substance Use Topics  . Smoking status: Never Smoker   . Smokeless tobacco: Never Used  . Alcohol Use: No     Comment: 2 pints per week/stopped 01/10/14   OB History    No data available     Review of Systems  Genitourinary: Negative for hematuria.      Allergies  Penicillins and Tylenol  Home Medications   Prior to Admission medications   Medication Sig Start Date End  Date Taking? Authorizing Provider  amLODipine (NORVASC) 10 MG tablet Take 10 mg by mouth daily.   Yes Historical Provider, MD  gabapentin (NEURONTIN) 800 MG tablet Take 1,200 mg by mouth 3 (three) times daily.   Yes Historical Provider, MD  nortriptyline (PAMELOR) 25 MG capsule Take 25 mg by mouth at bedtime.   Yes Historical Provider, MD  Omega-3 Fatty Acids (FISH OIL PO) Take 1-2 capsules by mouth.   Yes Historical Provider, MD   BP 112/69 mmHg  Pulse 81  Temp(Src) 99.1 F (37.3 C) (Oral)  Resp 16  Ht 5\' 1"  (1.549 m)  Wt 69.429 kg  BMI 28.94 kg/m2  SpO2 100%  LMP 04/24/2015 (Exact Date) Physical Exam  Constitutional: She is oriented to person, place, and time. She appears well-developed and well-nourished. No distress.  HENT:  Head: Normocephalic and atraumatic.  Mouth/Throat: Oropharynx is clear and moist. No oropharyngeal exudate.  Eyes: Conjunctivae and EOM are normal. Pupils are equal, round, and reactive to light. Right eye exhibits no discharge. Left eye exhibits no discharge. Scleral icterus ( Mild scleral icterus. Chronic hepatitis C and cirrhosis.) is present.  Neck: Neck supple.  Cardiovascular: Normal rate, regular rhythm, normal heart sounds and intact distal pulses.  Exam reveals no gallop and no friction rub.   No murmur heard. Pulmonary/Chest: Effort normal and breath sounds normal. No respiratory distress. She has no wheezes. She has no rales. She exhibits no tenderness.  Abdominal:  Soft. Bowel sounds are normal. She exhibits no distension. There is tenderness ( Diffuse tenderness throughout the abdomen. Most significant in the right upper quadrant. Positive Murphy sign. No peritoneal signs. Abdomen is not rigid or distended, however exquisitely tender.). There is guarding.  Genitourinary:  CMT present. Patient is on her menses, gross blood in vaginal canal. Vaginal atrophy present.  Musculoskeletal: Normal range of motion. She exhibits no edema.  Lymphadenopathy:     She has no cervical adenopathy.  Neurological: She is alert and oriented to person, place, and time. No cranial nerve deficit.  Skin: Skin is warm and dry. No rash noted. She is not diaphoretic. No erythema. No pallor.  Psychiatric: She has a normal mood and affect. Her behavior is normal.  Nursing note and vitals reviewed.   ED Course  Procedures (including critical care time) Labs Review Labs Reviewed  WET PREP, GENITAL - Abnormal; Notable for the following:    Clue Cells Wet Prep HPF POC PRESENT (*)    WBC, Wet Prep HPF POC FEW (*)    All other components within normal limits  CBC WITH DIFFERENTIAL/PLATELET - Abnormal; Notable for the following:    WBC 27.1 (*)    Neutro Abs 25.2 (*)    All other components within normal limits  COMPREHENSIVE METABOLIC PANEL - Abnormal; Notable for the following:    Chloride 100 (*)    CO2 20 (*)    Glucose, Bld 136 (*)    Calcium 8.7 (*)    Albumin 3.3 (*)    ALT 12 (*)    Total Bilirubin 1.8 (*)    All other components within normal limits  URINALYSIS, ROUTINE W REFLEX MICROSCOPIC (NOT AT [facility]) - Abnormal; Notable for the following:    Color, Urine ORANGE (*)    APPearance CLOUDY (*)    Hgb urine dipstick LARGE (*)    Bilirubin Urine SMALL (*)    Ketones, ur 15 (*)    Protein, ur 30 (*)    Nitrite POSITIVE (*)    Leukocytes, UA SMALL (*)    All other components within normal limits  URINE MICROSCOPIC-ADD ON - Abnormal; Notable for the following:    Squamous Epithelial / LPF 0-5 (*)    Bacteria, UA FEW (*)    All other components within normal limits  CBC WITH DIFFERENTIAL/PLATELET - Abnormal; Notable for the following:    WBC 19.2 (*)    RBC 3.60 (*)    Hemoglobin 11.0 (*)    HCT 31.0 (*)    Neutro Abs 17.0 (*)    Monocytes Absolute 1.1 (*)    All other components within normal limits  LIPASE, BLOOD  HIV ANTIBODY (ROUTINE TESTING)  RPR  GENTAMICIN LEVEL, RANDOM  POC URINE PREG, ED  I-STAT CG4 LACTIC ACID, ED  GC/CHLAMYDIA  PROBE AMP ([place]) NOT AT [facility]    Imaging Review Dg Abd 1 View  04/25/2015  CLINICAL DATA:  Acute epigastric abdominal pain. EXAM: ABDOMEN - 1 VIEW COMPARISON:  None. FINDINGS: The bowel gas pattern is normal. No radio-opaque calculi or other significant radiographic abnormality are seen. IMPRESSION: No evidence of bowel obstruction or ileus. Electronically Signed   By: Lupita Raider, M.D.   On: 04/25/2015 08:54   Ct Abdomen Pelvis W Contrast  04/25/2015  CLINICAL DATA:  Acute generalized abdominal pain and distention. EXAM: CT ABDOMEN AND PELVIS WITH CONTRAST TECHNIQUE: Multidetector CT imaging of the abdomen and pelvis was performed using the standard protocol following bolus  administration of intravenous contrast. CONTRAST:  OMNIPAQUE IOHEXOL 300 MG/ML  SOLN COMPARISON:  None. 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 Visualized lung bases are unremarkable. No gallstones are noted. Lobular hepatic margins are again noted consistent with hepatic cirrhosis. No focal abnormality is noted in the liver. The spleen and pancreas appear normal. Adrenal glands and kidneys appear normal. No hydronephrosis or renal obstruction is noted. There is no evidence of abdominal aortic aneurysm. The appendix appears normal. There is no evidence of bowel obstruction. Mild adenopathy is noted in porta hepatis region most likely related to hepatic cirrhosis. Probable 3.6 cm fibroid is seen arising from the uterine fundus. Bilateral hydrosalpinges are noted with surrounding free fluid suggesting pelvic inflammatory disease. Urinary bladder is decompressed. IMPRESSION: Hepatic cirrhosis is noted. 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 Probable 3.6 cm uterine fibroid is seen. Bilateral hydrosalpinx is noted in the pelvis with  surrounding fluid suggesting pelvic inflammatory disease. Electronically Signed   By: Lupita Raider, M.D.   On: 04/25/2015 12:45   I have personally reviewed and evaluated these images and lab results as part of my medical decision-making.   EKG Interpretation None      MDM   Final diagnoses:  PID (acute pelvic inflammatory disease)    55 y.o F with pmhx of chronic hepatitis C, cirrhosis presents with severe abdominal pain onset 2 days ago. Pt still with regular menstrual cycle, currently menstruating. On exam pt is extremely uncomfortable, writhing in pain. Abdominal exam with diffuse tenderness, exquisitely tender in RUQ. WBC elevated at 27.  Given pt hx ddx includes hepatic abscess, cholecystitis, PID. Abd xray ordered STAT to r/o free air. This is negative, We will CT abd to r/o. Pt initially afebrile on exam however Tmax in ED is 100F. Pain managed in ED and fluid given.  HcG negative.  CT reveals bilateral hydrosalpinx suggestive of PID. On pelvic exam pt with exquisite CMT. Gross blood in vaginal canal as pt is menstruating. Abx initiated. Clindamycin and Gentamicin given per pharmacy protocol as pt has anaphylaxis allergy to PCN.   Spoke with Dr. Arelia Longest who recommends inpatient treatment for this pt. Recommends transfer to [facility] hospital. We will complete EMTALA.   Discussed with pt treatment plan and findings on CT and labs.  Patient was discussed with and seen by Dr. Criss Alvine who agrees with the treatment plan.    Lester Kinsman [place], PA-C 04/26/15 1610  Pricilla Loveless, MD 04/28/15 [phone]

## 2015-04-26 NOTE — Plan of Care (Signed)
Problem: Education: Goal: Knowledge of [facility] Education information/materials will improve Outcome: Completed/Met Date Met:  04/26/15 Plan of care,medications for pain,tests to be performed,safety,and vaccines discussed.  Problem: Safety: Goal: Ability to remain free from injury will improve Outcome: Completed/Met Date Met:  04/26/15 Safety measures discussed.  Problem: Pain Managment: Goal: General experience of comfort will improve Outcome: Completed/Met Date Met:  04/26/15 Patient aware of the types of pain medication that she has available,their side effects,and how often they are available.

## 2015-04-26 NOTE — Progress Notes (Signed)
Hosp day 2 admitted for PID with bilateral hydrosalpinx and free fluid Subjective: Patient reports tolerating PO.  Abdomen is improved, on Gentamycin and Clindamycin  Objective: I have reviewed patient's vital signs, medications and labs. CBC Latest Ref Rng 04/26/2015 04/25/2015 07/30/2014  WBC 4.0 - 10.5 K/uL 19.2(H) 27.1(H) 7.9  Hemoglobin 12.0 - 15.0 g/dL 11.0(L) 13.7 12.9  Hematocrit 36.0 - 46.0 % 31.0(L) 39.1 38.4  Platelets 150 - 400 K/uL 160 204 139(L)   CMP Latest Ref Rng 04/25/2015 07/30/2014 12/31/2013  Glucose 65 - 99 mg/dL 782(N136(H) 82 72  BUN 6 - 20 mg/dL 9 13 9   Creatinine 0.44 - 1.00 mg/dL 5.620.75 1.300.69 8.650.65  Sodium 135 - 145 mmol/L 135 137 138  Potassium 3.5 - 5.1 mmol/L 3.6 4.4 4.2  Chloride 101 - 111 mmol/L 100(L) 103 104  CO2 22 - 32 mmol/L 20(L) 25 24  Calcium 8.9 - 10.3 mg/dL 7.8(I8.7(L) 9.0 8.6  Total Protein 6.5 - 8.1 g/dL 7.3 7.1 7.0  Total Bilirubin 0.3 - 1.2 mg/dL 6.9(G1.8(H) 0.4 0.9  Alkaline Phos 38 - 126 U/L 63 48 66  AST 15 - 41 U/L 24 17 164(H)  ALT 14 - 54 U/L 12(L) 15 177(H)        General: alert and mild distress Resp: clear to auscultation bilaterally GI: normal findings: bowel sounds normal, no scars, striae, dilated veins, rashes, or lesions and guards , but no rebound upper or lower quadrants., abnormal findings:  guarding and slight fullness, improved Vaginal Bleeding: moderate legs negative for DVT   Assessment/Plan: Acute PID , 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            LOS: 1 day    Halo Laski V 04/26/2015, 7:53 AM

## 2015-04-26 NOTE — Progress Notes (Signed)
ANTIBIOTIC CONSULT NOTE - Follow-up  Pharmacy Consult for Gentamicin  Indication: Pelvic Inflammatory Disease   Allergies  Allergen Reactions  . Penicillins     Facial swelling  . Tylenol [Acetaminophen] Other (See Comments)    HX of Hep. C    Patient Measurements: Height: 5\' 1"  (154.9 cm) Weight: 153 lb 1 oz (69.429 kg) IBW/kg (Calculated) : 47.8 Adjusted Body Weight: 57 kg   Vital Signs: Temp: 102.2 F (39 C) (12/03 0138) Temp Source: Oral (12/03 0138) BP: 120/75 mmHg (12/03 0138) Pulse Rate: 104 (12/03 0138) Intake/Output from previous day: 12/02 0701 - 12/03 0700 In: 1000 [I.V.:1000] Out: 200 [Urine:200] Intake/Output from this shift: Total I/O In: -  Out: 200 [Urine:200]  Labs:  Recent Labs  04/25/15 0640 04/26/15 0230  WBC 27.1* 19.2*  HGB 13.7 11.0*  PLT 204 160  CREATININE 0.75  --    Estimated Creatinine Clearance: 72.4 mL/min (by C-G formula based on Cr of 0.75).  Recent Labs  04/26/15 0230  GENTRANDOM 1.3     Microbiology: Recent Results (from the past 720 hour(s))  Wet prep, genital     Status: Abnormal   Collection Time: 04/25/15  1:15 PM  Result Value Ref Range Status   Yeast Wet Prep HPF POC NONE SEEN NONE SEEN Final   Trich, Wet Prep NONE SEEN NONE SEEN Final   Clue Cells Wet Prep HPF POC PRESENT (A) NONE SEEN Final   WBC, Wet Prep HPF POC FEW (A) NONE SEEN Final   Sperm NONE SEEN  Final    Assessment: Rachel Hudson on gentamicin and clindamycin for pelvic inflammatory disease. [facility]. 10 hr gentamicin level 1.3 mcg/ml so according to [facility] Extended Interval Dosing Nomogram. WBC 19.2 - trending down. Tm 102.2.    [facility] at [facility] RN to call Dr. Emelda FearFerguson to make sure that Clindamycin is reordered.  Goal of Therapy:  Gentamicin trough level <2 mcg/ml  Plan:  -Continue gentamicin 400mg  (7 mg/kg) Q 24 hours  -Monitor CBC, renal fx, cultures and clinical progress   Christoper Fabianaron Ygnacio Fecteau, PharmD,  BCPS Clinical pharmacist, pager [phone] 04/26/2015 3:29 AM

## 2015-04-27 DIAGNOSIS — N730 Acute parametritis and pelvic cellulitis: Principal | ICD-10-CM

## 2015-04-27 DIAGNOSIS — N7011 Chronic salpingitis: Secondary | ICD-10-CM

## 2015-04-27 MED ORDER — INFLUENZA VAC SPLIT QUAD 0.5 ML IM SUSY
0.5000 mL | PREFILLED_SYRINGE | INTRAMUSCULAR | Status: AC
  Administered 2015-04-28: 0.5 mL via INTRAMUSCULAR
  Filled 2015-04-27: qty 0.5

## 2015-04-27 NOTE — Plan of Care (Signed)
Problem: Tissue Perfusion: Goal: Risk factors for ineffective tissue perfusion will decrease Outcome: Completed/Met Date Met:  04/27/15 Has been wearing her SCD hose, and walking in room.  Problem: Activity: Goal: Risk for activity intolerance will decrease Outcome: Completed/Met Date Met:  04/27/15 Has tolerated being up in room and walking to bathroom a lot better than upon admit.  Problem: Fluid Volume: Goal: Ability to maintain a balanced intake and output will improve Outcome: Completed/Met Date Met:  04/27/15 Has been drinking more,but still has IV fluids infusing for Abx tx  Problem: Nutrition: Goal: Adequate nutrition will be maintained Outcome: Completed/Met Date Met:  04/27/15 Ate better today only a little nauseated in the early AM.

## 2015-04-27 NOTE — Progress Notes (Signed)
Hosp day 3 admitted for PID with bilateral hydrosalpinx and free fluid Subjective: Patient reports having continued pain, on Gentamycin and Clindamycin  Objective: I have reviewed patient's vital signs, medications and labs. Temp:  [98.6 F (37 C)-100.1 F (37.8 C)] 100.1 F (37.8 C) (12/04 0554) Pulse Rate:  [65-93] 81 (12/04 0554) Resp:  [15-16] 15 (12/04 0554) BP: (106-137)/(60-79) 128/76 mmHg (12/04 0554) SpO2:  [96 %-100 %] 96 % (12/04 0554) Last fever 102.2 04/26/15 0138  General: alert and in no distress Resp: normal breath sounds, no distress Heart: Normal heart rate GI: normal findings: bowel sounds normal, diffuse lower abdominal pain. No rebound but had voluntary guarding.   Ext: No c/c/e. Negative for DVT  CBC Latest Ref Rng 04/26/2015 04/25/2015 07/30/2014  WBC 4.0 - 10.5 K/uL 19.2(H) 27.1(H) 7.9  Hemoglobin 12.0 - 15.0 g/dL 11.0(L) 13.7 12.9  Hematocrit 36.0 - 46.0 % 31.0(L) 39.1 38.4  Platelets 150 - 400 K/uL 160 204 139(L)   CMP Latest Ref Rng 04/25/2015 07/30/2014 12/31/2013  Glucose 65 - 99 mg/dL 621(H) 82 72  BUN 6 - 20 mg/dL Creatinine 0.44 - 1.00 mg/dL 0.86 5.78 4.69  Sodium 135 - 145 mmol/L 135 137 138  Potassium 3.5 - 5.1 mmol/L 3.6 4.4 4.2  Chloride 101 - 111 mmol/L 100(L) 103 104  CO2 22 - 32 mmol/L 20(L) 25 24  Calcium 8.9 - 10.3 mg/dL 6.2(X) 9.0 8.6  Total Protein 6.5 - 8.1 g/dL 7.3 7.1 7.0  Total Bilirubin 0.3 - 1.2 mg/dL 5.2(W) 0.4 0.9  Alkaline Phos 38 - 126 U/L 63 48 66  AST 15 - 41 U/L 24 17 164(H)  ALT 14 - 54 U/L 12(L) 15 177(H)    Dg Abd 1 View  04/25/2015  CLINICAL DATA:  Acute epigastric abdominal pain. EXAM: ABDOMEN - 1 VIEW COMPARISON:  None. FINDINGS: The bowel gas pattern is normal. No radio-opaque calculi or other significant radiographic abnormality are seen. IMPRESSION: No evidence of bowel obstruction or ileus. Electronically Signed   By: Lupita Raider, M.D.   On: 04/25/2015 08:54   US Pelvis Complete  04/26/2015  CLINICAL  DATA:  Pelvic inflammatory disease. EXAM: TRANSABDOMINAL ULTRASOUND OF PELVIS TECHNIQUE: Transabdominal ultrasound examination of the pelvis was performed including evaluation of the uterus, ovaries, adnexal regions, and pelvic cul-de-sac. COMPARISON:  Abdominal CT from yesterday FINDINGS: Uterus Measurements: 9 x 6 x 5 cm. Hypoechoic sub serosal mass in the anterior body measuring 32 mm. Endometrium Thickness: 6 mm.  No focal abnormality visualized. Right ovary Not clearly discernible due to tubular structure measuring 5 cm in length by 4 cm in diameter with internal mid level echoes. The length of the dilated tube is underestimated relative to previous CT). Left ovary Not discernible due to tubular structure in the adnexum measuring 7 cm in length by 4 cm in diameter, with internal mid level echoes. Other findings:  Small volume free fluid in the deep pelvis. IMPRESSION: 1. Bilateral hydrosalpinx or pyosalpinx. 2. Ovaries not visualized separate from the tubes. 3. 3 cm sub serosal fibroid. Electronically Signed   By: Marnee Spring M.D.   On: 04/26/2015 11:19   Ct Abdomen Pelvis W Contrast  04/25/2015  CLINICAL DATA:  Acute generalized abdominal pain and distention. EXAM: CT ABDOMEN AND PELVIS WITH CONTRAST TECHNIQUE: Multidetector CT imaging of the abdomen and pelvis was performed using the standard protocol following bolus administration of intravenous contrast. CONTRAST:  OMNIPAQUE IOHEXOL 300 MG/ML  SOLN COMPARISON:  None. 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 Visualized lung bases are unremarkable. No gallstones are noted. Lobular hepatic margins are again noted consistent with hepatic cirrhosis. No focal abnormality is noted in the liver. The spleen and pancreas appear normal. Adrenal glands and kidneys appear normal. No hydronephrosis or renal obstruction is noted. There is no evidence of  abdominal aortic aneurysm. The appendix appears normal. There is no evidence of bowel obstruction. Mild adenopathy is noted in porta hepatis region most likely related to hepatic cirrhosis. Probable 3.6 cm fibroid is seen arising from the uterine fundus. Bilateral hydrosalpinges are noted with surrounding free fluid suggesting pelvic inflammatory disease. Urinary bladder is decompressed. IMPRESSION: Hepatic cirrhosis is noted. 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 Probable 3.6 cm uterine fibroid is seen. Bilateral hydrosalpinx is noted in the pelvis with surrounding fluid suggesting pelvic inflammatory disease. Electronically Signed   By: Lupita RaiderJames  Green Jr, M.D.   On: 04/25/2015 12:45       Assessment/Plan: Acute PID , bilateral hydrosalpinx  Plan: Continue IV abx, Gentamicin/Cleocin, til afebrile x 48 hr, outpatient care to follow          Continue Analgesia as needed            LOS: 2 days    Klayton Monie A, MD 04/27/2015, 8:58 AM

## 2015-04-28 LAB — CREATININE, SERUM
CREATININE: 0.82 mg/dL (ref 0.44–1.00)
GFR calc Af Amer: >60 mL/min (ref >60)
GFR calc non Af Amer: >60 mL/min (ref >60)

## 2015-04-28 LAB — GC/CHLAMYDIA PROBE AMP (~~LOC~~) NOT AT ARMC
Chlamydia: NEGATIVE
Neisseria Gonorrhea: NEGATIVE

## 2015-04-28 NOTE — Progress Notes (Addendum)
Subjective: Patient continues to have pain. Yesterday patient had several episodes of vomiting. However this has since improved, and patient was able to eat a chicken wing last night   Objective:  Temp:  [98.6 F (37 C)-100.3 F (37.9 C)] 98.8 F (37.1 C) (12/05 0601) Pulse Rate:  [62-79] 62 (12/05 0601) Resp:  [16-18] 16 (12/05 0601) BP: (99-150)/(60-85) 119/74 mmHg (12/05 0601) SpO2:  [93 %-98 %] 97 % (12/05 0601) Last fever 102.2 04/26/15 0138  General: alert and in no distress Resp: normal breath sounds, no distress Heart: Normal heart rate GI: normal findings: bowel sounds normal, diffuse lower abdominal pain. No rebound but had voluntary guarding.   Ext: No c/c/e. Negative for DVT  CBC Latest Ref Rng 04/26/2015 04/25/2015 07/30/2014  WBC 4.0 - 10.5 K/uL 19.2(H) 27.1(H) 7.9  Hemoglobin 12.0 - 15.0 g/dL 11.0(L) 13.7 12.9  Hematocrit 36.0 - 46.0 % 31.0(L) 39.1 38.4  Platelets 150 - 400 K/uL 160 204 139(L)   CMP Latest Ref Rng 04/25/2015 07/30/2014 12/31/2013  Glucose 65 - 99 mg/dL 098(J136(H) 82 72  BUN 6 - 20 mg/dL 9 13 9   Creatinine 0.44 - 1.00 mg/dL 1.910.75 4.780.69 2.950.65  Sodium 135 - 145 mmol/L 135 137 138  Potassium 3.5 - 5.1 mmol/L 3.6 4.4 4.2  Chloride 101 - 111 mmol/L 100(L) 103 104  CO2 22 - 32 mmol/L 20(L) 25 24  Calcium 8.9 - 10.3 mg/dL 6.2(Z8.7(L) 9.0 8.6  Total Protein 6.5 - 8.1 g/dL 7.3 7.1 7.0  Total Bilirubin 0.3 - 1.2 mg/dL 3.0(Q1.8(H) 0.4 0.9  Alkaline Phos 38 - 126 U/L 63 48 66  AST 15 - 41 U/L 24 17 164(H)  ALT 14 - 54 U/L 12(L) 15 177(H)    Dg Abd 1 View  04/25/2015  CLINICAL DATA:  Acute epigastric abdominal pain. EXAM: ABDOMEN - 1 VIEW COMPARISON:  None. FINDINGS: The bowel gas pattern is normal. No radio-opaque calculi or other significant radiographic abnormality are seen. IMPRESSION: No evidence of bowel obstruction or ileus. Electronically Signed   By: Lupita RaiderJames  Green Jr, M.D.   On: 04/25/2015 08:54   [place] Pelvis Complete  04/26/2015  CLINICAL DATA:  Pelvic inflammatory  disease. EXAM: TRANSABDOMINAL ULTRASOUND OF PELVIS TECHNIQUE: Transabdominal ultrasound examination of the pelvis was performed including evaluation of the uterus, ovaries, adnexal regions, and pelvic cul-de-sac. COMPARISON:  Abdominal CT from yesterday FINDINGS: Uterus Measurements: 9 x 6 x 5 cm. Hypoechoic sub serosal mass in the anterior body measuring 32 mm. Endometrium Thickness: 6 mm.  No focal abnormality visualized. Right ovary Not clearly discernible due to tubular structure measuring 5 cm in length by 4 cm in diameter with internal mid level echoes. The length of the dilated tube is underestimated relative to previous CT). Left ovary Not discernible due to tubular structure in the adnexum measuring 7 cm in length by 4 cm in diameter, with internal mid level echoes. Other findings:  Small volume free fluid in the deep pelvis. IMPRESSION: 1. Bilateral hydrosalpinx or pyosalpinx. 2. Ovaries not visualized separate from the tubes. 3. 3 cm sub serosal fibroid. Electronically Signed   By: Marnee SpringJonathon  Watts M.D.   On: 04/26/2015 11:19   Ct Abdomen Pelvis W Contrast  04/25/2015  CLINICAL DATA:  Acute generalized abdominal pain and distention. EXAM: CT ABDOMEN AND PELVIS WITH CONTRAST TECHNIQUE: Multidetector CT imaging of the abdomen and pelvis was performed using the standard protocol following bolus administration of intravenous contrast. CONTRAST:  500mL OMNIPAQUE IOHEXOL 300 MG/ML  SOLN COMPARISON:  None. 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 Visualized lung bases are unremarkable. No gallstones are noted. Lobular hepatic margins are again noted consistent with hepatic cirrhosis. No focal abnormality is noted in the liver. The spleen and pancreas appear normal. Adrenal glands and kidneys appear normal. No hydronephrosis or renal obstruction is noted. There is no evidence of abdominal aortic aneurysm. The  appendix appears normal. There is no evidence of bowel obstruction. Mild adenopathy is noted in porta hepatis region most likely related to hepatic cirrhosis. Probable 3.6 cm fibroid is seen arising from the uterine fundus. Bilateral hydrosalpinges are noted with surrounding free fluid suggesting pelvic inflammatory disease. Urinary bladder is decompressed. IMPRESSION: Hepatic cirrhosis is noted. 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 Probable 3.6 cm uterine fibroid is seen. Bilateral hydrosalpinx is noted in the pelvis with surrounding fluid suggesting pelvic inflammatory disease. Electronically Signed   By: Lupita Raider, M.D.   On: 04/25/2015 12:45    Assessment/Plan: Acute PID , bilateral hydrosalpinx  Plan: Continue IV abx, Gentamicin/Cleocin, if continues to remain afebrile home tomorrow as would be 48 hours out          Continue Analgesia as needed            LOS: 3 days    Allison Deshotels Angelene Giovanni, MD 04/28/2015, 8:59 AM

## 2015-04-28 NOTE — Progress Notes (Signed)
Serum Creatinine slightly increased to 0.82mg /dL today, up from 0.75mg /dL on 40/01/8111/2/16.  Plan to continue current gentamicin regimen and follow closely.  Ensure that patient has adequate hydration.    Hurley CiscoMendenhall, Salsabeel Gorelick D, Pharm.D.

## 2015-04-29 ENCOUNTER — Inpatient Hospital Stay (HOSPITAL_COMMUNITY): Payer: Self-pay

## 2015-04-29 ENCOUNTER — Encounter (HOSPITAL_COMMUNITY): Payer: Self-pay | Admitting: Radiology

## 2015-04-29 DIAGNOSIS — N7003 Acute salpingitis and oophoritis: Secondary | ICD-10-CM

## 2015-04-29 DIAGNOSIS — R5081 Fever presenting with conditions classified elsewhere: Secondary | ICD-10-CM

## 2015-04-29 LAB — CBC
HEMATOCRIT: 32.6 % — AB (ref 36.0–46.0)
Hemoglobin: 11.6 g/dL — ABNORMAL LOW (ref 12.0–15.0)
MCH: 29.7 pg (ref 26.0–34.0)
MCHC: 35.6 g/dL (ref 30.0–36.0)
MCV: 83.4 fL (ref 78.0–100.0)
Platelets: 199 10*3/uL (ref 150–400)
RBC: 3.91 MIL/uL (ref 3.87–5.11)
RDW: 13.1 % (ref 11.5–15.5)
WBC: 8.6 10*3/uL (ref 4.0–10.5)

## 2015-04-29 LAB — COMPREHENSIVE METABOLIC PANEL
ALT: 16 U/L (ref 14–54)
AST: 25 U/L (ref 15–41)
Albumin: 3.1 g/dL — ABNORMAL LOW (ref 3.5–5.0)
Alkaline Phosphatase: 62 U/L (ref 38–126)
Anion gap: 9 (ref 5–15)
BILIRUBIN TOTAL: 0.7 mg/dL (ref 0.3–1.2)
BUN: 7 mg/dL (ref 6–20)
CHLORIDE: 98 mmol/L — AB (ref 101–111)
CO2: 26 mmol/L (ref 22–32)
CREATININE: 0.84 mg/dL (ref 0.44–1.00)
Calcium: 8.4 mg/dL — ABNORMAL LOW (ref 8.9–10.3)
GFR calc Af Amer: >60 mL/min (ref >60)
GLUCOSE: 107 mg/dL — AB (ref 65–99)
Potassium: 2.8 mmol/L — ABNORMAL LOW (ref 3.5–5.1)
Sodium: 133 mmol/L — ABNORMAL LOW (ref 135–145)
TOTAL PROTEIN: 6.8 g/dL (ref 6.5–8.1)

## 2015-04-29 MED ORDER — IOHEXOL 300 MG/ML  SOLN: 100.0000 mL | Freq: Once | INTRAMUSCULAR | Status: DC | PRN

## 2015-04-29 MED ORDER — IOHEXOL 300 MG/ML  SOLN
100.0000 mL | Freq: Once | INTRAMUSCULAR | Status: AC | PRN
Start: 2015-04-29 — End: 2015-04-29
  Administered 2015-04-29: 100 mL via INTRAVENOUS

## 2015-04-29 MED ORDER — IOHEXOL 300 MG/ML  SOLN
50.0000 mL | Freq: Once | INTRAMUSCULAR | Status: AC | PRN
  Administered 2015-04-29: 50 mL via ORAL

## 2015-04-29 NOTE — Progress Notes (Signed)
Subjective: Patient reports tolerating PO.  LLQ pain level 8  Objective: I have reviewed patient's vital signs, medications, labs and radiology results. Today's Vitals   04/29/15 0018 04/29/15 0305 04/29/15 0500 04/29/15 0626  BP:    140/65  Pulse:    63  Temp:    99.8 F (37.7 C)  TempSrc:    Oral  Resp:    16  Height:      Weight:      SpO2:    98%  PainSc: Asleep 2  Asleep   Tmax 100.3  General: alert, cooperative and no distress GI: mild tenderness LLQ no rebound   Assessment/Plan: Bilateral TOA, low grade fever and pain persists. Will repeat CT scan and consider IR drainage   LOS: 4 days    ARNOLD,JAMES 04/29/2015, 6:56 AM

## 2015-04-29 NOTE — Progress Notes (Signed)
Patient ID: [name], female   DOB: 24-Mar-1961, 54 y.o.   MRN: [id number] Received call from Dr. Shawnie PonsPratt regarding need for CT guided drainage of bilat TOA. Images were reviewed by Dr. Deanne CofferHassell and areas appear to be amenable to drainage. [facility] Pt should arrive in radiology at 1100 12/7. Orders placed. [facility]

## 2015-04-30 ENCOUNTER — Encounter (HOSPITAL_COMMUNITY): Payer: Self-pay

## 2015-04-30 ENCOUNTER — Ambulatory Visit (HOSPITAL_COMMUNITY)
Admission: RE | Admit: 2015-04-30 | Discharge: 2015-04-30 | Disposition: A | Discharge status: Home / Self Care | Payer: Self-pay | Source: Ambulatory Visit | Attending: Radiology | Admitting: Radiology

## 2015-04-30 ENCOUNTER — Ambulatory Visit (HOSPITAL_COMMUNITY)
Admit: 2015-04-30 | Discharge: 2015-04-30 | Disposition: A | Discharge status: Home / Self Care | Payer: Self-pay | Attending: Radiology | Admitting: Radiology

## 2015-04-30 DIAGNOSIS — N7093 Salpingitis and oophoritis, unspecified: Secondary | ICD-10-CM | POA: Insufficient documentation

## 2015-04-30 DIAGNOSIS — N838 Other noninflammatory disorders of ovary, fallopian tube and broad ligament: Secondary | ICD-10-CM | POA: Insufficient documentation

## 2015-04-30 LAB — COMPREHENSIVE METABOLIC PANEL
ALBUMIN: 2.8 g/dL — AB (ref 3.5–5.0)
ALT: 19 U/L (ref 14–54)
AST: 28 U/L (ref 15–41)
Alkaline Phosphatase: 59 U/L (ref 38–126)
Anion gap: 11 (ref 5–15)
BUN: 6 mg/dL (ref 6–20)
CALCIUM: 7.8 mg/dL — AB (ref 8.9–10.3)
CHLORIDE: 95 mmol/L — AB (ref 101–111)
CO2: 26 mmol/L (ref 22–32)
CREATININE: 0.79 mg/dL (ref 0.44–1.00)
GFR calc Af Amer: >60 mL/min (ref >60)
GFR calc non Af Amer: >60 mL/min (ref >60)
GLUCOSE: 101 mg/dL — AB (ref 65–99)
Potassium: 3 mmol/L — ABNORMAL LOW (ref 3.5–5.1)
SODIUM: 132 mmol/L — AB (ref 135–145)
Total Bilirubin: 0.4 mg/dL (ref 0.3–1.2)
Total Protein: 6.8 g/dL (ref 6.5–8.1)

## 2015-04-30 LAB — PROTIME-INR
INR: 1.25 (ref 0.00–1.49)
Prothrombin Time: 15.8 seconds — ABNORMAL HIGH (ref 11.6–15.2)

## 2015-04-30 LAB — CBC
HEMATOCRIT: 31.7 % — AB (ref 36.0–46.0)
HEMOGLOBIN: 11.3 g/dL — AB (ref 12.0–15.0)
MCH: 29.7 pg (ref 26.0–34.0)
MCHC: 35.6 g/dL (ref 30.0–36.0)
MCV: 83.4 fL (ref 78.0–100.0)
Platelets: 223 10*3/uL (ref 150–400)
RBC: 3.8 MIL/uL — AB (ref 3.87–5.11)
RDW: 13.2 % (ref 11.5–15.5)
WBC: 9.2 10*3/uL (ref 4.0–10.5)

## 2015-04-30 MED ORDER — GENTAMICIN SULFATE 40 MG/ML IJ SOLN
7.0000 mg/kg | INTRAVENOUS | Status: DC
  Administered 2015-05-01: 400 mg via INTRAVENOUS
  Filled 2015-04-30 (×2): qty 10

## 2015-04-30 MED ORDER — ONDANSETRON HCL 4 MG/2ML IJ SOLN
4.0000 mg | Freq: Once | INTRAMUSCULAR | Status: AC
  Administered 2015-04-30: 4 mg via INTRAVENOUS
  Filled 2015-04-30 (×2): qty 2

## 2015-04-30 MED ORDER — FENTANYL CITRATE (PF) 100 MCG/2ML IJ SOLN
INTRAMUSCULAR | Status: AC
  Filled 2015-04-30: qty 4

## 2015-04-30 MED ORDER — SODIUM CHLORIDE 0.9 % IV SOLN
Freq: Once | INTRAVENOUS | Status: AC
  Administered 2015-04-30: 500 mL via INTRAVENOUS

## 2015-04-30 MED ORDER — MIDAZOLAM HCL 2 MG/2ML IJ SOLN
INTRAMUSCULAR | Status: AC
  Filled 2015-04-30: qty 6

## 2015-04-30 MED ORDER — CLINDAMYCIN PHOSPHATE 900 MG/50ML IV SOLN
900.0000 mg | Freq: Three times a day (TID) | INTRAVENOUS | Status: DC
  Administered 2015-04-30 – 2015-05-02 (×5): 900 mg via INTRAVENOUS
  Filled 2015-04-30 (×6): qty 50

## 2015-04-30 MED ORDER — MIDAZOLAM HCL 2 MG/2ML IJ SOLN
INTRAMUSCULAR | Status: AC | PRN
  Administered 2015-04-30 (×6): 1 mg via INTRAVENOUS

## 2015-04-30 MED ORDER — MIDAZOLAM HCL 2 MG/2ML IJ SOLN
INTRAMUSCULAR | Status: AC
  Filled 2015-04-30: qty 4

## 2015-04-30 MED ORDER — SODIUM CHLORIDE 0.9 % IJ SOLN
3.0000 mL | Freq: Once | INTRAMUSCULAR | Status: AC
  Administered 2015-04-30: 3 mL via INTRAVENOUS

## 2015-04-30 MED ORDER — FENTANYL CITRATE (PF) 100 MCG/2ML IJ SOLN
INTRAMUSCULAR | Status: AC | PRN
  Administered 2015-04-30 (×2): 25 ug via INTRAVENOUS
  Administered 2015-04-30: 50 ug via INTRAVENOUS
  Administered 2015-04-30: 25 ug via INTRAVENOUS

## 2015-04-30 MED ORDER — PIPERACILLIN-TAZOBACTAM 3.375 G IVPB 30 MIN: 3.3750 g | Freq: Three times a day (TID) | INTRAVENOUS | Status: DC

## 2015-04-30 MED ORDER — ACETAMINOPHEN 500 MG PO TABS
500.0000 mg | ORAL_TABLET | Freq: Once | ORAL | Status: AC
  Administered 2015-04-30: 500 mg via ORAL
  Filled 2015-04-30: qty 1

## 2015-04-30 NOTE — Procedures (Signed)
Technically successful CT guided placed of a 10 Fr drainage catheter placement into the right hemipelvis via R TG approach yielding 90 cc of purulent material. Technically successful CT guided placed of a 10 Fr drainage catheter placement into the left hemipelvis via anterior approach yielding 35 cc of purulent material. All aspirated samples sent to the laboratory for analysis.   No immediate post procedural complications.   Katherina RightJay Brelynn Wheller, MD Pager #: [phone]

## 2015-04-30 NOTE — Progress Notes (Signed)
Patient ID: Rachel Hudson, female   DOB: March 24, 1961, 54 y.o.   MRN: [id number]   Subjective: Interval History:still quite uncomfortable.  Objective: Vital signs in last 24 hours: Temp:  [98.2 F (36.8 C)-99.9 F (37.7 C)] 99.8 F (37.7 C) (12/07 0524) Pulse Rate:  [65-95] 66 (12/07 0524) Resp:  [14-16] 16 (12/07 0524) BP: (112-126)/(67-71) 112/68 mmHg (12/07 0524) SpO2:  [95 %-99 %] 96 % (12/07 0524)  Intake/Output from previous day: 12/06 0701 - 12/07 0700 In: 1894 [I.V.:1894] Out: -  Intake/Output this shift:    General appearance: alert, cooperative and appears stated age Lungs: normal effort Abdomen: soft, exquisitely tender, + guarding Extremities: Homans sign is negative, no sign of DVT  Results for orders placed or performed during the hospital encounter of 04/25/15 (from the past 24 hour(s))  CBC     Status: Abnormal   Collection Time: 04/29/15  8:20 AM  Result Value Ref Range   WBC 8.6 4.0 - 10.5 K/uL   RBC 3.91 3.87 - 5.11 MIL/uL   Hemoglobin 11.6 (L) 12.0 - 15.0 g/dL   HCT 40.9 (L) 81.1 - 91.4 %   MCV 83.4 78.0 - 100.0 fL   MCH 29.7 26.0 - 34.0 pg   MCHC 35.6 30.0 - 36.0 g/dL   RDW 78.2 95.6 - 21.3 %   Platelets 199 150 - 400 K/uL  Comprehensive metabolic panel     Status: Abnormal   Collection Time: 04/29/15  8:20 AM  Result Value Ref Range   Sodium 133 (L) 135 - 145 mmol/L   Potassium 2.8 (L) 3.5 - 5.1 mmol/L   Chloride 98 (L) 101 - 111 mmol/L   CO2 26 22 - 32 mmol/L   Glucose, Bld 107 (H) 65 - 99 mg/dL   BUN 7 6 - 20 mg/dL   Creatinine, Ser 0.86 0.44 - 1.00 mg/dL   Calcium 8.4 (L) 8.9 - 10.3 mg/dL   Total Protein 6.8 6.5 - 8.1 g/dL   Albumin 3.1 (L) 3.5 - 5.0 g/dL   AST 25 15 - 41 U/L   ALT 16 14 - 54 U/L   Alkaline Phosphatase 62 38 - 126 U/L   Total Bilirubin 0.7 0.3 - 1.2 mg/dL   GFR calc non Af Amer >60 >60 mL/min   GFR calc Af Amer >60 >60 mL/min   Anion gap 9 5 - 15  CBC     Status: Abnormal   Collection Time: 04/30/15  5:15 AM   Result Value Ref Range   WBC 9.2 4.0 - 10.5 K/uL   RBC 3.80 (L) 3.87 - 5.11 MIL/uL   Hemoglobin 11.3 (L) 12.0 - 15.0 g/dL   HCT 57.8 (L) 46.9 - 62.9 %   MCV 83.4 78.0 - 100.0 fL   MCH 29.7 26.0 - 34.0 pg   MCHC 35.6 30.0 - 36.0 g/dL   RDW 52.8 41.3 - 24.4 %   Platelets 223 150 - 400 K/uL  Protime-INR     Status: Abnormal   Collection Time: 04/30/15  5:15 AM  Result Value Ref Range   Prothrombin Time 15.8 (H) 11.6 - 15.2 seconds   INR 1.25 0.00 - 1.49  Comprehensive metabolic panel     Status: Abnormal   Collection Time: 04/30/15  5:15 AM  Result Value Ref Range   Sodium 132 (L) 135 - 145 mmol/L   Potassium 3.0 (L) 3.5 - 5.1 mmol/L   Chloride 95 (L) 101 - 111 mmol/L   CO2 26 22 - 32  mmol/L   Glucose, Bld 101 (H) 65 - 99 mg/dL   BUN 6 6 - 20 mg/dL   Creatinine, Ser 2.13 0.44 - 1.00 mg/dL   Calcium 7.8 (L) 8.9 - 10.3 mg/dL   Total Protein 6.8 6.5 - 8.1 g/dL   Albumin 2.8 (L) 3.5 - 5.0 g/dL   AST 28 15 - 41 U/L   ALT 19 14 - 54 U/L   Alkaline Phosphatase 59 38 - 126 U/L   Total Bilirubin 0.4 0.3 - 1.2 mg/dL   GFR calc non Af Amer >60 >60 mL/min   GFR calc Af Amer >60 >60 mL/min   Anion gap 11 5 - 15    Studies/Results: Dg Abd 1 View  04/25/2015  CLINICAL DATA:  Acute epigastric abdominal pain. EXAM: ABDOMEN - 1 VIEW COMPARISON:  None. FINDINGS: The bowel gas pattern is normal. No radio-opaque calculi or other significant radiographic abnormality are seen. IMPRESSION: No evidence of bowel obstruction or ileus. Electronically Signed   By: Lupita Raider, M.D.   On: 04/25/2015 08:54   US Pelvis Complete  04/26/2015  CLINICAL DATA:  Pelvic inflammatory disease. EXAM: TRANSABDOMINAL ULTRASOUND OF PELVIS TECHNIQUE: Transabdominal ultrasound examination of the pelvis was performed including evaluation of the uterus, ovaries, adnexal regions, and pelvic cul-de-sac. COMPARISON:  Abdominal CT from yesterday FINDINGS: Uterus Measurements: 9 x 6 x 5 cm. Hypoechoic sub serosal mass in the  anterior body measuring 32 mm. Endometrium Thickness: 6 mm.  No focal abnormality visualized. Right ovary Not clearly discernible due to tubular structure measuring 5 cm in length by 4 cm in diameter with internal mid level echoes. The length of the dilated tube is underestimated relative to previous CT). Left ovary Not discernible due to tubular structure in the adnexum measuring 7 cm in length by 4 cm in diameter, with internal mid level echoes. Other findings:  Small volume free fluid in the deep pelvis. IMPRESSION: 1. Bilateral hydrosalpinx or pyosalpinx. 2. Ovaries not visualized separate from the tubes. 3. 3 cm sub serosal fibroid. Electronically Signed   By: Marnee Spring M.D.   On: 04/26/2015 11:19   Ct Abdomen Pelvis W Contrast  04/29/2015  CLINICAL DATA:  Follow-up tubo ovarian abscess EXAM: CT ABDOMEN AND PELVIS WITH CONTRAST TECHNIQUE: Multidetector CT imaging of the abdomen and pelvis was performed using the standard protocol following bolus administration of intravenous contrast. CONTRAST:  OMNIPAQUE IOHEXOL 300 MG/ML  SOLN COMPARISON:  05/13/2015 and 04/26/2015 FINDINGS: Lung bases are unremarkable. Sagittal images of the spine shows significant disc space flattening with vacuum disc phenomenon endplate sclerotic changes at L4-L5 level. There is about 1.1 cm anterolisthesis L4 on L5 vertebral body. Again noted cirrhosis of the liver there is a cyst in left hepatic lobe measures 1.2 cm. There is mild thickening of the wall of the gallbladder up to 3.6 mm. Trace pericholecystic fluid. No significant change from prior exam. No aortic aneurysm. The pancreas and adrenal glands are unremarkable. Punctate calcifications are noted within spleen probable from prior granulomatous disease. Atherosclerotic calcifications of abdominal aorta and iliac arteries. No aortic aneurysm. No small bowel obstruction. No free abdominal air. Small amount of pelvic ascites noted in posterior pelvis. Normal appendix  is noted in axial image 58. No pericecal inflammation. Again noted myometrial uterine fibroid measures at least 3.3 cm. No calcified gallstones are noted within gallbladder. There is tubular structure containing fluid with mild stranding of surrounding fat in left adnexa left anterior pelvis measures 7.3 x 3.6 cm.  Similar tubular structure in right adnexa extending posterior cul-de-sac measures 8.6 by 3.4 cm. This is highly suspicious for bilateral hydrosalpinx or tubo-ovarian abscess. Correlation with GYN exam is recommended. Moderate stool noted within rectum. No distal colonic obstruction. There is no inguinal adenopathy. Kidneys are symmetrical in size and enhancement. No hydronephrosis or hydroureter. Delayed renal images shows bilateral renal symmetrical excretion. Bilateral visualized proximal ureter is unremarkable. IMPRESSION: 1. Again noted bilateral tubular adnexal lesion suspicious for hydrosalpinx or tubo-ovarian abscess. Small amount of pelvic free fluid. 2. Again noted fibroid uterus. 3. Normal appendix.  No pericecal inflammation. 4. No small bowel obstruction. 5. No hydronephrosis or hydroureter. 6. Again noted cirrhosis of the liver. 7. Stable mild thickening of the wall of gallbladder and trace pericholecystic fluid. Electronically Signed   By: Natasha MeadLiviu  Pop M.D.   On: 04/29/2015 09:49   Ct Abdomen Pelvis W Contrast  04/25/2015  CLINICAL DATA:  Acute generalized abdominal pain and distention. EXAM: CT ABDOMEN AND PELVIS WITH CONTRAST TECHNIQUE: Multidetector CT imaging of the abdomen and pelvis was performed using the standard protocol following bolus administration of intravenous contrast. CONTRAST:  500mL OMNIPAQUE IOHEXOL 300 MG/ML  SOLN COMPARISON:  None. 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 Visualized lung bases are unremarkable. No gallstones are noted. Lobular hepatic margins are again  noted consistent with hepatic cirrhosis. No focal abnormality is noted in the liver. The spleen and pancreas appear normal. Adrenal glands and kidneys appear normal. No hydronephrosis or renal obstruction is noted. There is no evidence of abdominal aortic aneurysm. The appendix appears normal. There is no evidence of bowel obstruction. Mild adenopathy is noted in porta hepatis region most likely related to hepatic cirrhosis. Probable 3.6 cm fibroid is seen arising from the uterine fundus. Bilateral hydrosalpinges are noted with surrounding free fluid suggesting pelvic inflammatory disease. Urinary bladder is decompressed. IMPRESSION: Hepatic cirrhosis is noted. 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 Probable 3.6 cm uterine fibroid is seen. Bilateral hydrosalpinx is noted in the pelvis with surrounding fluid suggesting pelvic inflammatory disease. Electronically Signed   By: Lupita RaiderJames  Green Jr, M.D.   On: 04/25/2015 12:45    Scheduled Meds: . clindamycin (CLEOCIN) IV  900 mg Intravenous Q8H  . enoxaparin (LOVENOX) injection  40 mg Subcutaneous Q24H  . gentamicin  7 mg/kg (Adjusted) Intravenous Q24H  . prenatal multivitamin  1 tablet Oral Q1200   Continuous Infusions: . lactated ringers 100 mL/hr at 04/29/15 1805   PRN Meds:alum & mag hydroxide-simeth, HYDROmorphone (DILAUDID) injection, HYDROmorphone, ibuprofen, ondansetron **OR** ondansetron (ZOFRAN) IV, polyethylene glycol, traMADol, zolpidem  Assessment/Plan: Active Problems:   PID (acute pelvic inflammatory disease)  For IR drainage today Continue IV abx   LOS: 5 days   Buster Schueller S, MD 04/30/2015 7:50 AM

## 2015-04-30 NOTE — Progress Notes (Signed)
Report given to Abby RN, receiving nurse to room [facility]

## 2015-04-30 NOTE — Progress Notes (Signed)
Chief Complaint: Patient was seen in consultation today for drainage of tubovarian abscess at the request of Dr. Debroah Loop  Referring Physician(s): Dr. Debroah Loop  History of Present Illness: Rachel Hudson is a 54 y.o. female with tubovarian/pelvic abscess. Has been treated for several days with IV abx, but not making much progress.  IR is now requested to perc drain. Chart, imaging , labs, meds, allergies reviewed. Pt has been NPO  Past Medical History  Diagnosis Date  . Hypertension   . Hep C w/o coma, chronic (HCC) As of 10/22/13  . 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 Ligament repair Right 1980    Knee    Allergies: Penicillins and Tylenol  Medications: No current facility-administered medications for this encounter. No current outpatient prescriptions on file.  Facility-Administered Medications Ordered in Other Encounters:  .  alum & mag hydroxide-simeth (MAALOX/MYLANTA) 200-200-20 MG/5ML suspension 30 mL, 30 mL, Oral, Q4H PRN, Christin Bach V, MD .  clindamycin (CLEOCIN) IVPB 900 mg, 900 mg, Intravenous, Q8H, Christin Bach V, MD, 900 mg at 04/30/15 0300 .  enoxaparin (LOVENOX) injection 40 mg, 40 mg, Subcutaneous, Q24H, Tilda Burrow, MD, 40 mg at 04/29/15 2219 .  gentamicin (GARAMYCIN) 400 mg in dextrose 5 % 100 mL IVPB, 7 mg/kg (Adjusted), Intravenous, Q24H, Candis Schatz Mancheril, RPH, Last Rate: 110 mL/hr at 04/29/15 1425, 400 mg at 04/29/15 1425 .  HYDROmorphone (DILAUDID) injection 1 mg, 1 mg, Intravenous, Q2H PRN, Tilda Burrow, MD, 1 mg at 04/26/15 0857 .  HYDROmorphone (DILAUDID) tablet 2-4 mg, 2-4 mg, Oral, Q4H PRN, Tereso Newcomer, MD, 4 mg at 04/30/15 0908 .  ibuprofen (ADVIL,MOTRIN) tablet 600 mg, 600 mg, Oral, Q6H PRN, Tilda Burrow, MD, 600 mg at 04/28/15 0206 .  lactated ringers infusion, , Intravenous, Continuous, Tilda Burrow, MD, Last Rate: 100 mL/hr at 04/29/15 1805 .   ondansetron (ZOFRAN) tablet 4 mg, 4 mg, Oral, Q6H PRN, 4 mg at 04/29/15 1933 **OR** ondansetron (ZOFRAN) injection 4 mg, 4 mg, Intravenous, Q6H PRN, Tilda Burrow, MD, 4 mg at 04/28/15 1936 .  polyethylene glycol (MIRALAX / GLYCOLAX) packet 17 g, 17 g, Oral, Daily PRN, Christin Bach V, MD .  prenatal multivitamin tablet 1 tablet, 1 tablet, Oral, Q1200, Tilda Burrow, MD, 1 tablet at 04/28/15 1240 .  traMADol (ULTRAM) tablet 50 mg, 50 mg, Oral, Q6H PRN, Tilda Burrow, MD, 50 mg at 04/26/15 1312 .  zolpidem (AMBIEN) tablet 5 mg, 5 mg, Oral, QHS PRN, Tilda Burrow, MD    Family History  Problem Relation Age of Onset  . Adopted: Yes  . Family history unknown: Yes    Social History   Social History  . Marital Status: Single    Spouse Name: N/A  . Number of Children: N/A  . Years of Education: N/A   Social History Main Topics  . Smoking status: Never Smoker   . Smokeless tobacco: Never Used  . Alcohol Use: No     Comment: 2 pints per week/stopped 01/10/14  . Drug Use: 7.00 per week    Special: Marijuana  . Sexual Activity: Yes    Birth Control/ Protection: None   Other Topics Concern  . None   Social History Narrative    Review of Systems: A 12 point ROS discussed and pertinent positives are indicated in the HPI above.  All other systems are  negative.  Review of Systems  Vital Signs: BP 116/76 mmHg  Pulse 58  Temp(Src) 98.6 F (37 C)  Resp 20  Ht  (1.549 m)  Wt 153 lb (69.4 kg)  BMI 28.92 kg/m2  SpO2 98%  LMP 04/24/2015 (Exact Date)  Physical Exam  Constitutional: She is oriented to person, place, and time. She appears well-developed and well-nourished. No distress.  HENT:  Head: Normocephalic.  Mouth/Throat: Oropharynx is clear and moist.  Neck: Normal range of motion. No tracheal deviation present.  Cardiovascular: Normal rate, regular rhythm and normal heart sounds.   Pulmonary/Chest: Effort normal and breath sounds normal. No respiratory distress.   Abdominal: Soft. She exhibits no mass. There is tenderness.  Neurological: She is alert and oriented to person, place, and time.  Psychiatric: She has a normal mood and affect. Judgment normal.    Mallampati Score:  MD Evaluation Airway: WNL Heart: WNL Abdomen: WNL Chest/ Lungs: WNL ASA  Classification: 2 Mallampati/Airway Score: Two  Imaging: Dg Abd 1 View  04/25/2015  CLINICAL DATA:  Acute epigastric abdominal pain. EXAM: ABDOMEN - 1 VIEW COMPARISON:  None. FINDINGS: The bowel gas pattern is normal. No radio-opaque calculi or other significant radiographic abnormality are seen. IMPRESSION: No evidence of bowel obstruction or ileus. Electronically Signed   By: Lupita Raider, M.D.   On: 04/25/2015 08:54   US Pelvis Complete  04/26/2015  CLINICAL DATA:  Pelvic inflammatory disease. EXAM: TRANSABDOMINAL ULTRASOUND OF PELVIS TECHNIQUE: Transabdominal ultrasound examination of the pelvis was performed including evaluation of the uterus, ovaries, adnexal regions, and pelvic cul-de-sac. COMPARISON:  Abdominal CT from yesterday FINDINGS: Uterus Measurements: 9 x 6 x 5 cm. Hypoechoic sub serosal mass in the anterior body measuring 32 mm. Endometrium Thickness: 6 mm.  No focal abnormality visualized. Right ovary Not clearly discernible due to tubular structure measuring 5 cm in length by 4 cm in diameter with internal mid level echoes. The length of the dilated tube is underestimated relative to previous CT). Left ovary Not discernible due to tubular structure in the adnexum measuring 7 cm in length by 4 cm in diameter, with internal mid level echoes. Other findings:  Small volume free fluid in the deep pelvis. IMPRESSION: 1. Bilateral hydrosalpinx or pyosalpinx. 2. Ovaries not visualized separate from the tubes. 3. 3 cm sub serosal fibroid. Electronically Signed   By: Marnee Spring M.D.   On: 04/26/2015 11:19   Ct Abdomen Pelvis W Contrast  04/29/2015  CLINICAL DATA:  Follow-up tubo ovarian  abscess EXAM: CT ABDOMEN AND PELVIS WITH CONTRAST TECHNIQUE: Multidetector CT imaging of the abdomen and pelvis was performed using the standard protocol following bolus administration of intravenous contrast. CONTRAST:  OMNIPAQUE IOHEXOL 300 MG/ML  SOLN COMPARISON:  05/13/2015 and 04/26/2015 FINDINGS: Lung bases are unremarkable. Sagittal images of the spine shows significant disc space flattening with vacuum disc phenomenon endplate sclerotic changes at L4-L5 level. There is about 1.1 cm anterolisthesis L4 on L5 vertebral body. Again noted cirrhosis of the liver there is a cyst in left hepatic lobe measures 1.2 cm. There is mild thickening of the wall of the gallbladder up to 3.6 mm. Trace pericholecystic fluid. No significant change from prior exam. No aortic aneurysm. The pancreas and adrenal glands are unremarkable. Punctate calcifications are noted within spleen probable from prior granulomatous disease. Atherosclerotic calcifications of abdominal aorta and iliac arteries. No aortic aneurysm. No small bowel obstruction. No free abdominal air. Small amount of pelvic ascites noted in posterior pelvis. Normal  appendix is noted in axial image 58. No pericecal inflammation. Again noted myometrial uterine fibroid measures at least 3.3 cm. No calcified gallstones are noted within gallbladder. There is tubular structure containing fluid with mild stranding of surrounding fat in left adnexa left anterior pelvis measures 7.3 x 3.6 cm. Similar tubular structure in right adnexa extending posterior cul-de-sac measures 8.6 by 3.4 cm. This is highly suspicious for bilateral hydrosalpinx or tubo-ovarian abscess. Correlation with GYN exam is recommended. Moderate stool noted within rectum. No distal colonic obstruction. There is no inguinal adenopathy. Kidneys are symmetrical in size and enhancement. No hydronephrosis or hydroureter. Delayed renal images shows bilateral renal symmetrical excretion. Bilateral visualized  proximal ureter is unremarkable. IMPRESSION: 1. Again noted bilateral tubular adnexal lesion suspicious for hydrosalpinx or tubo-ovarian abscess. Small amount of pelvic free fluid. 2. Again noted fibroid uterus. 3. Normal appendix.  No pericecal inflammation. 4. No small bowel obstruction. 5. No hydronephrosis or hydroureter. 6. Again noted cirrhosis of the liver. 7. Stable mild thickening of the wall of gallbladder and trace pericholecystic fluid. Electronically Signed   By: Natasha MeadLiviu  Pop M.D.   On: 04/29/2015 09:49   Ct Abdomen Pelvis W Contrast  04/25/2015  CLINICAL DATA:  Acute generalized abdominal pain and distention. EXAM: CT ABDOMEN AND PELVIS WITH CONTRAST TECHNIQUE: Multidetector CT imaging of the abdomen and pelvis was performed using the standard protocol following bolus administration of intravenous contrast. CONTRAST:  500mL OMNIPAQUE IOHEXOL 300 MG/ML  SOLN COMPARISON:  None. 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 Visualized lung bases are unremarkable. No gallstones are noted. Lobular hepatic margins are again noted consistent with hepatic cirrhosis. No focal abnormality is noted in the liver. The spleen and pancreas appear normal. Adrenal glands and kidneys appear normal. No hydronephrosis or renal obstruction is noted. There is no evidence of abdominal aortic aneurysm. The appendix appears normal. There is no evidence of bowel obstruction. Mild adenopathy is noted in porta hepatis region most likely related to hepatic cirrhosis. Probable 3.6 cm fibroid is seen arising from the uterine fundus. Bilateral hydrosalpinges are noted with surrounding free fluid suggesting pelvic inflammatory disease. Urinary bladder is decompressed. IMPRESSION: Hepatic cirrhosis is noted. 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  Probable 3.6 cm uterine fibroid is seen. Bilateral hydrosalpinx is noted in the pelvis with surrounding fluid suggesting pelvic inflammatory disease. Electronically Signed   By: Lupita RaiderJames  Green Jr, M.D.   On: 04/25/2015 12:45    Labs:  CBC:  Recent Labs  04/25/15 0640 04/26/15 0230 04/29/15 0820 04/30/15 0515  WBC 27.1* 19.2* 8.6 9.2  HGB 13.7 11.0* 11.6* 11.3*  HCT 39.1 31.0* 32.6* 31.7*  PLT 204 160 199 223    COAGS:  Recent Labs  04/30/15 0515  INR 1.25    BMP:  Recent Labs  07/30/14 1007 04/25/15 0640 04/28/15 1100 04/29/15 0820 04/30/15 0515  NA 137 135  --  133* 132*  K 4.4 3.6  --  2.8* 3.0*  CL 103 100*  --  98* 95*  CO2 25 20*  --  26 26  GLUCOSE 82 136*  --  107* 101*  BUN 13 9  --  7 6  CALCIUM 9.0 8.7*  --  8.4* 7.8*  CREATININE 0.69 0.75 0.82 0.84 0.79  GFRNONAA  --  >60 >60 >60 >60  GFRAA  --  >  60 >60 >60 >60    LIVER FUNCTION TESTS:  Recent Labs  07/30/14 1007 04/25/15 0640 04/29/15 0820 04/30/15 0515  BILITOT 0.4 1.8* 0.7 0.4  AST ALT 15 12* 16 19  ALKPHOS 48 63 62 59  PROT 7.1 7.3 6.8 6.8  ALBUMIN 3.9 3.3* 3.1* 2.8*    TUMOR MARKERS: No results for input(s): AFPTM, CEA, CA199, CHROMGRNA in the last 8760 hours.  Assessment and Plan: Tubovarian/pelvic abscess For CT drainage x 2. Risks and Benefits discussed with the patient including bleeding, infection, damage to adjacent structures, bowel perforation/fistula connection, and sepsis. All of the patient's questions were answered, patient is agreeable to proceed. Consent signed and in chart.    Thank you for this interesting consult.  I greatly enjoyed meeting Rachel Hudson and look forward to participating in their care.  A copy of this report was sent to the requesting provider on this date.  SignedBrayton El 04/30/2015, 11:22 AM   I spent a total of 20 minutes in face to face in clinical consultation, greater than 50% of which was  counseling/coordinating care for pelvic abscess drainage

## 2015-04-30 NOTE — Progress Notes (Signed)
Received post procedure. Left and right percutaneous cath draining scant amount bloody pus.

## 2015-05-01 NOTE — Progress Notes (Signed)
ANTIBIOTIC CONSULT NOTE - FOLLOW UP  Pharmacy Consult for Gentamicin Indication: Bilateral TOA  Allergies  Allergen Reactions  . Penicillins     Facial swelling  . Tylenol [Acetaminophen] Other (See Comments)    HX of Hep. C    Patient Measurements: Height: 5\' 1"  (154.9 cm) Weight: 153 lb 1 oz (69.429 kg) IBW/kg (Calculated) : 47.8 kg  Vital Signs: Temp: 98.3 F (36.8 C) (12/08 0551) Temp Source: Oral (12/08 0551) BP: 103/58 mmHg (12/08 0551) Pulse Rate: 57 (12/08 0551)  Tmax 102.1 @ 16:40 on 12/7  Intake/Output from previous day: 12/07 0701 - 12/08 0700 In: 1389.7 [P.O.:240; I.V.:1019.7; IV Piggyback:100] Out: 965 [Urine:900; Drains:65]    Labs:  Recent Labs  04/28/15 1100 04/29/15 0820 04/30/15 0515  WBC  --  8.6 9.2  HGB  --  11.6* 11.3*  PLT  --  199 223  CREATININE 0.82 0.84 0.79   Estimated Creatinine Clearance: 72.4 mL/min (by C-G formula based on Cr of 0.79).   Gentamicin random level on 04/26/15 after 1st gentamicin dose = 1.3 in normal range according to the [facility]    Microbiology: Recent Results (from the past 720 hour(s))  Wet prep, genital     Status: Abnormal   Collection Time: 04/25/15  1:15 PM  Result Value Ref Range Status   Yeast Wet Prep HPF POC NONE SEEN NONE SEEN Final   Trich, Wet Prep NONE SEEN NONE SEEN Final   Clue Cells Wet Prep HPF POC PRESENT (A) NONE SEEN Final   WBC, Wet Prep HPF POC FEW (A) NONE SEEN Final   Sperm NONE SEEN  Final  Culture, routine-abscess     Status: None (Preliminary result)   Collection Time: 04/30/15  1:41 PM  Result Value Ref Range Status   Specimen Description DRAINAGE RIGHT SIDE  Final   Special Requests NONE  Final   Gram Stain PENDING  Incomplete   Culture NO GROWTH [facility]formed [facility]   Final   Report Status PENDING  Incomplete    Anti-infectives    Start     Dose/Rate Route Frequency Ordered Stop   05/01/15 1600  gentamicin (GARAMYCIN) 400 mg in dextrose 5 % 100  mL IVPB     7 mg/kg  57.7 kg (Adjusted) 110 mL/hr over 60 Minutes Intravenous Every 24 hours 04/30/15 1702     04/30/15 2000  clindamycin (CLEOCIN) IVPB 900 mg     900 mg 100 mL/hr over 30 Minutes Intravenous Every 8 hours 04/30/15 1700     04/30/15 1700  piperacillin-tazobactam (ZOSYN) IVPB 3.375 g  Status:  Discontinued     3.375 g 100 mL/hr over 30 Minutes Intravenous 3 times per day 04/30/15 1653 04/30/15 1701   04/26/15 0330  clindamycin (CLEOCIN) IVPB 900 mg  Status:  Discontinued     900 mg 100 mL/hr over 30 Minutes Intravenous Every 8 hours 04/26/15 0330 04/30/15 1653   04/25/15 1500  gentamicin (GARAMYCIN) 400 mg in dextrose 5 % 100 mL IVPB  Status:  Discontinued     7 mg/kg  57.7 kg (Adjusted) 110 mL/hr over 60 Minutes Intravenous Every 24 hours 04/25/15 1453 04/30/15 1653   04/25/15 1415  clindamycin (CLEOCIN) IVPB 900 mg     900 mg 100 mL/hr over 30 Minutes Intravenous  Once 04/25/15 1405 04/25/15 1646      Assessment: Pt is s/p drain placement 04/30/15 with cultures pending.  Renal function is stable.  Plan:  Will consider rechecking a  random gentamicin level 12/9 if gentamicin continues Follow cultures  Natasha Bence 05/01/2015,8:54 AM

## 2015-05-01 NOTE — Progress Notes (Signed)
Patient ID: Rachel Hudson, female   DOB: [birth year], 54 y.o.   MRN: [id number] Patient ID: Rachel Hudson, female   DOB: [birth year], 54 y.o.   MRN: [id number]   Subjective: Pain better on the right, the same on the left  Objective: Vital signs in last 24 hours: Temp:  [97.9 F (36.6 C)-102.1 F (38.9 C)] 98.3 F (36.8 C) (12/08 0551) Pulse Rate:  [57-85] 57 (12/08 0551) Resp:  [10-22] 18 (12/08 0551) BP: (93-143)/(52-99) 103/58 mmHg (12/08 0551) SpO2:  [97 %-100 %] 97 % (12/08 0551) Weight:  [153 lb (69.4 kg)] 153 lb (69.4 kg) (12/07 1103)  Intake/Output from previous day: 12/07 0701 - 12/08 0700 In: 1389.7 [P.O.:240; I.V.:1019.7] Out: 965 [Urine:900; Drains:65] Intake/Output this shift:    General appearance: alert, cooperative and appears stated age Lungs: normal effort Abdomen: soft, exquisitely tender, + guarding Extremities: Homans sign is negative, no sign of DVT  No rebound  Results for orders placed or performed during the hospital encounter of 04/30/15 (from the past 24 hour(s))  Culture, routine-abscess     Status: None (Preliminary result)   Collection Time: 04/30/15  1:41 PM  Result Value Ref Range   Specimen Description DRAINAGE RIGHT SIDE    Special Requests NONE    Gram Stain PENDING    Culture NO GROWTH [facility]formed at [facility]     Report Status PENDING     Studies/Results: Dg Abd 1 View  04/25/2015  CLINICAL DATA:  Acute epigastric abdominal pain. EXAM: ABDOMEN - 1 VIEW COMPARISON:  None. FINDINGS: The bowel gas pattern is normal. No radio-opaque calculi or other significant radiographic abnormality are seen. IMPRESSION: No evidence of bowel obstruction or ileus. Electronically Signed   By: Lupita RaiderJames  Green Jr, M.D.   On: 04/25/2015 08:54   [place] Pelvis Complete  04/26/2015  CLINICAL DATA:  Pelvic inflammatory disease. EXAM: TRANSABDOMINAL ULTRASOUND OF PELVIS TECHNIQUE: Transabdominal ultrasound examination of the pelvis was performed including  evaluation of the uterus, ovaries, adnexal regions, and pelvic cul-de-sac. COMPARISON:  Abdominal CT from yesterday FINDINGS: Uterus Measurements: 9 x 6 x 5 cm. Hypoechoic sub serosal mass in the anterior body measuring 32 mm. Endometrium Thickness: 6 mm.  No focal abnormality visualized. Right ovary Not clearly discernible due to tubular structure measuring 5 cm in length by 4 cm in diameter with internal mid level echoes. The length of the dilated tube is underestimated relative to previous CT). Left ovary Not discernible due to tubular structure in the adnexum measuring 7 cm in length by 4 cm in diameter, with internal mid level echoes. Other findings:  Small volume free fluid in the deep pelvis. IMPRESSION: 1. Bilateral hydrosalpinx or pyosalpinx. 2. Ovaries not visualized separate from the tubes. 3. 3 cm sub serosal fibroid. Electronically Signed   By: Marnee SpringJonathon  Watts M.D.   On: 04/26/2015 11:19   Ct Abdomen Pelvis W Contrast  04/29/2015  CLINICAL DATA:  Follow-up tubo ovarian abscess EXAM: CT ABDOMEN AND PELVIS WITH CONTRAST TECHNIQUE: Multidetector CT imaging of the abdomen and pelvis was performed using the standard protocol following bolus administration of intravenous contrast. CONTRAST:  100mL OMNIPAQUE IOHEXOL 300 MG/ML  SOLN COMPARISON:  05/13/2015 and 04/26/2015 FINDINGS: Lung bases are unremarkable. Sagittal images of the spine shows significant disc space flattening with vacuum disc phenomenon endplate sclerotic changes at L4-L5 level. There is about 1.1 cm anterolisthesis L4 on L5 vertebral body. Again noted cirrhosis of the liver there is a cyst in left hepatic lobe measures 1.2  cm. There is mild thickening of the wall of the gallbladder up to 3.6 mm. Trace pericholecystic fluid. No significant change from prior exam. No aortic aneurysm. The pancreas and adrenal glands are unremarkable. Punctate calcifications are noted within spleen probable from prior granulomatous disease. Atherosclerotic  calcifications of abdominal aorta and iliac arteries. No aortic aneurysm. No small bowel obstruction. No free abdominal air. Small amount of pelvic ascites noted in posterior pelvis. Normal appendix is noted in axial image 58. No pericecal inflammation. Again noted myometrial uterine fibroid measures at least 3.3 cm. No calcified gallstones are noted within gallbladder. There is tubular structure containing fluid with mild stranding of surrounding fat in left adnexa left anterior pelvis measures 7.3 x 3.6 cm. Similar tubular structure in right adnexa extending posterior cul-de-sac measures 8.6 by 3.4 cm. This is highly suspicious for bilateral hydrosalpinx or tubo-ovarian abscess. Correlation with GYN exam is recommended. Moderate stool noted within rectum. No distal colonic obstruction. There is no inguinal adenopathy. Kidneys are symmetrical in size and enhancement. No hydronephrosis or hydroureter. Delayed renal images shows bilateral renal symmetrical excretion. Bilateral visualized proximal ureter is unremarkable. IMPRESSION: 1. Again noted bilateral tubular adnexal lesion suspicious for hydrosalpinx or tubo-ovarian abscess. Small amount of pelvic free fluid. 2. Again noted fibroid uterus. 3. Normal appendix.  No pericecal inflammation. 4. No small bowel obstruction. 5. No hydronephrosis or hydroureter. 6. Again noted cirrhosis of the liver. 7. Stable mild thickening of the wall of gallbladder and trace pericholecystic fluid. Electronically Signed   By: Natasha Mead M.D.   On: 04/29/2015 09:49   Ct Abdomen Pelvis W Contrast  04/25/2015  CLINICAL DATA:  Acute generalized abdominal pain and distention. EXAM: CT ABDOMEN AND PELVIS WITH CONTRAST TECHNIQUE: Multidetector CT imaging of the abdomen and pelvis was performed using the standard protocol following bolus administration of intravenous contrast. CONTRAST:  OMNIPAQUE IOHEXOL 300 MG/ML  SOLN COMPARISON:  None. 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 Visualized lung bases are unremarkable. No gallstones are noted. Lobular hepatic margins are again noted consistent with hepatic cirrhosis. No focal abnormality is noted in the liver. The spleen and pancreas appear normal. Adrenal glands and kidneys appear normal. No hydronephrosis or renal obstruction is noted. There is no evidence of abdominal aortic aneurysm. The appendix appears normal. There is no evidence of bowel obstruction. Mild adenopathy is noted in porta hepatis region most likely related to hepatic cirrhosis. Probable 3.6 cm fibroid is seen arising from the uterine fundus. Bilateral hydrosalpinges are noted with surrounding free fluid suggesting pelvic inflammatory disease. Urinary bladder is decompressed. IMPRESSION: Hepatic cirrhosis is noted. 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 Probable 3.6 cm uterine fibroid is seen. Bilateral hydrosalpinx is noted in the pelvis with surrounding fluid suggesting pelvic inflammatory disease. Electronically Signed   By: Lupita Raider, M.D.   On: 04/25/2015 12:45   Ct Image Guided Drainage By Percutaneous Catheter  04/30/2015  INDICATION: History of bilateral tubo-ovarian abscess with progressive symptoms despite intravenous antibiotics. Patient presents today for CT-guided placement of bilateral pelvic percutaneous drainage catheters. EXAM: 1. CT-GUIDED RIGHT TRANS GLUTEAL APPROACH PERCUTANEOUS DRAINAGE CATHETER PLACEMENT 2. CT-GUIDED LEFT LOWER ANTERIOR PELVIC APPROACH PERCUTANEOUS DRAINAGE CATHETER PLACEMENT COMPARISON:  CT abdomen pelvis - 04/29/2015; 04/25/2015 MEDICATIONS: The patient is currently admitted to the hospital and receiving intravenous antibiotics. The antibiotics were  administered within an appropriate time frame prior to the initiation of the procedure.  ANESTHESIA/SEDATION: Fentanyl 125 mcg IV; Versed 7 mg IV Total Moderate Sedation time 57 minutes CONTRAST:  None COMPLICATIONS: None immediate PROCEDURE: Informed written consent was obtained from the patient after a discussion of the risks, benefits and alternatives to treatment. The patient was initially placed prone on the CT gantry and a pre procedural CT was performed re-demonstrating the known abscess/fluid collection within the right hemipelvis with dominant component measuring approximately 4.3 x 6.7 cm (image 28, series 2). The initial portion of the procedure was planned. A timeout was performed prior to the initiation of the procedure. The skin overlying the right buttocks was prepped and draped in the usual sterile fashion. The overlying soft tissues were anesthetized with 1% lidocaine with epinephrine. Appropriate trajectory was planned with the use of a 22 gauge spinal needle. An 18 gauge trocar needle was advanced into the abscess/fluid collection and a short Amplatz super stiff wire was coiled within the collection. Appropriate positioning was confirmed with a limited CT scan. The tract was serially dilated allowing placement of a 10 [place] all-purpose drainage catheter. Appropriate positioning was confirmed with a limited postprocedural CT scan. Approximately 90 Ml of purulent fluid was aspirated. A sample of aspirated fluid from the right pelvic fluid collection was sent to the laboratory for analysis. The tube was connected to a drainage bag and sutured in place. A dressing was placed. The patient was then positioned supine on the CT gantry with CT imaging demonstrating an grossly unchanged size location and appearance of the serpiginous left-sided component of the tumor very abscess with dominant component measuring approximately 7.4 x 2.6 cm (image 18, series 6). This portion of the procedure was planned. The skin overlying the left lower abdomen /upper pelvis was prepped and draped in the usual  sterile fashion. The overlying soft tissues were anesthetized with 1% lidocaine with epinephrine. Appropriate trajectory was planned with the use of a 22 gauge spinal needle. An 18 gauge trocar needle was advanced into the abscess/fluid collection and a short Amplatz super stiff wire was coiled within the collection. Appropriate positioning was confirmed with a limited CT scan. The tract was serially dilated allowing placement of a 10 [place] all-purpose drainage catheter. Appropriate positioning was confirmed with a limited postprocedural CT scan. Approximately 35 ml of purulent fluid was aspirated. A sample of the aspirated fluid from the left lower abdomen/pelvic fluid collection was sent to the laboratory for analysis. The tube was connected to a drainage bag and sutured in place. A dressing was placed. The patient tolerated both procedures well without immediate post procedural complication. IMPRESSION: 1. Successful CT guided placement of a 10 [place] all purpose drain catheter into the right lower pelvis via right trans gluteal approach with aspiration of 90 mL of purulent fluid. Samples were sent to the laboratory as requested by the ordering clinical team. 2. Successful CT guided placement of a 10 [place] all purpose drain catheter into the left lower abdomen / upper pelvis with aspiration of 35 mL of purulent fluid. Samples were sent to the laboratory as requested by the ordering clinical team. Electronically Signed   By: Simonne Come M.D.   On: 04/30/2015 16:25   Ct Image Guided Drainage Percut Cath  Peritoneal Retroperit  04/30/2015  INDICATION: History of bilateral tubo-ovarian abscess with progressive symptoms despite intravenous antibiotics. Patient presents today for CT-guided placement of bilateral pelvic percutaneous drainage catheters. EXAM: 1. CT-GUIDED RIGHT TRANS GLUTEAL  APPROACH PERCUTANEOUS DRAINAGE CATHETER PLACEMENT 2. CT-GUIDED LEFT LOWER ANTERIOR PELVIC APPROACH PERCUTANEOUS DRAINAGE  CATHETER PLACEMENT COMPARISON:  CT abdomen pelvis - 04/29/2015; 04/25/2015 MEDICATIONS: The patient is currently admitted to the hospital and receiving intravenous antibiotics. The antibiotics were administered within an appropriate time frame prior to the initiation of the procedure. ANESTHESIA/SEDATION: Fentanyl 125 mcg IV; Versed 7 mg IV Total Moderate Sedation time 57 minutes CONTRAST:  None COMPLICATIONS: None immediate PROCEDURE: Informed written consent was obtained from the patient after a discussion of the risks, benefits and alternatives to treatment. The patient was initially placed prone on the CT gantry and a pre procedural CT was performed re-demonstrating the known abscess/fluid collection within the right hemipelvis with dominant component measuring approximately 4.3 x 6.7 cm (image 28, series 2). The initial portion of the procedure was planned. A timeout was performed prior to the initiation of the procedure. The skin overlying the right buttocks was prepped and draped in the usual sterile fashion. The overlying soft tissues were anesthetized with 1% lidocaine with epinephrine. Appropriate trajectory was planned with the use of a 22 gauge spinal needle. An 18 gauge trocar needle was advanced into the abscess/fluid collection and a short Amplatz super stiff wire was coiled within the collection. Appropriate positioning was confirmed with a limited CT scan. The tract was serially dilated allowing placement of a 10 [place] all-purpose drainage catheter. Appropriate positioning was confirmed with a limited postprocedural CT scan. Approximately 90 Ml of purulent fluid was aspirated. A sample of aspirated fluid from the right pelvic fluid collection was sent to the laboratory for analysis. The tube was connected to a drainage bag and sutured in place. A dressing was placed. The patient was then positioned supine on the CT gantry with CT imaging demonstrating an grossly unchanged size location and  appearance of the serpiginous left-sided component of the tumor very abscess with dominant component measuring approximately 7.4 x 2.6 cm (image 18, series 6). This portion of the procedure was planned. The skin overlying the left lower abdomen /upper pelvis was prepped and draped in the usual sterile fashion. The overlying soft tissues were anesthetized with 1% lidocaine with epinephrine. Appropriate trajectory was planned with the use of a 22 gauge spinal needle. An 18 gauge trocar needle was advanced into the abscess/fluid collection and a short Amplatz super stiff wire was coiled within the collection. Appropriate positioning was confirmed with a limited CT scan. The tract was serially dilated allowing placement of a 10 [place] all-purpose drainage catheter. Appropriate positioning was confirmed with a limited postprocedural CT scan. Approximately 35 ml of purulent fluid was aspirated. A sample of the aspirated fluid from the left lower abdomen/pelvic fluid collection was sent to the laboratory for analysis. The tube was connected to a drainage bag and sutured in place. A dressing was placed. The patient tolerated both procedures well without immediate post procedural complication. IMPRESSION: 1. Successful CT guided placement of a 10 [place] all purpose drain catheter into the right lower pelvis via right trans gluteal approach with aspiration of 90 mL of purulent fluid. Samples were sent to the laboratory as requested by the ordering clinical team. 2. Successful CT guided placement of a 10 [place] all purpose drain catheter into the left lower abdomen / upper pelvis with aspiration of 35 mL of purulent fluid. Samples were sent to the laboratory as requested by the ordering clinical team. Electronically Signed   By: Simonne Come M.D.   On: 04/30/2015 16:25    Scheduled  Meds: . clindamycin (CLEOCIN) IV  900 mg Intravenous Q8H  . enoxaparin (LOVENOX) injection  40 mg Subcutaneous Q24H  . gentamicin  7 mg/kg  (Adjusted) Intravenous Q24H  . prenatal multivitamin  1 tablet Oral Q1200   Continuous Infusions: . lactated ringers 100 mL/hr at 04/29/15 1805   PRN Meds:alum & mag hydroxide-simeth, HYDROmorphone (DILAUDID) injection, HYDROmorphone, ibuprofen, ondansetron **OR** ondansetron (ZOFRAN) IV, polyethylene glycol, traMADol, zolpidem  Assessment/Plan: Active Problems:   PID (acute pelvic inflammatory disease) bilateral TOA   S/P IR drainage, continue antibiotics and assess clinical response post drainage Continue IV abx, gent and clinda   LOS: 6 days   Lazaro Arms, MD 05/01/2015 7:49 AM

## 2015-05-02 LAB — CREATININE, SERUM
CREATININE: 0.76 mg/dL (ref 0.44–1.00)
GFR calc non Af Amer: >60 mL/min (ref >60)

## 2015-05-02 MED ORDER — METRONIDAZOLE 500 MG PO TABS
500.0000 mg | ORAL_TABLET | Freq: Three times a day (TID) | ORAL | Status: DC
  Administered 2015-05-02 – 2015-05-04 (×7): 500 mg via ORAL
  Filled 2015-05-02 (×8): qty 1

## 2015-05-02 MED ORDER — DOXYCYCLINE HYCLATE 100 MG PO TABS
100.0000 mg | ORAL_TABLET | Freq: Two times a day (BID) | ORAL | Status: DC
  Administered 2015-05-02 – 2015-05-04 (×5): 100 mg via ORAL
  Filled 2015-05-02 (×5): qty 1

## 2015-05-02 NOTE — Progress Notes (Signed)
Patient ID: Rachel Hudson, female   DOB: Jan 14, 1961, 54 y.o.   MRN: [id number]   Subjective: Patient reports pain is slowly improving on the left side of her abdomen which is most bothersome. Pain is well controlled with medication  Objective: Vital signs in last 24 hours: Temp:  [98 F (36.7 C)-98.8 F (37.1 C)] 98.2 F (36.8 C) (12/09 0529) Pulse Rate:  [59-65] 59 (12/09 0529) Resp:  [14-18] 14 (12/09 0529) BP: (98-155)/(50-67) 105/61 mmHg (12/09 0529) SpO2:  [94 %-100 %] 97 % (12/09 0529)  Intake/Output from previous day: 12/08 0701 - 12/09 0700 In: 3876.3 [P.O.:1440; I.V.:2206.3] Out: 1791.5 [Urine:1775; Drains:16.5] (10 cc from right drain and 6.5 cc from left drain) Intake/Output this shift: Total I/O In: 1646.3 [P.O.:480; I.V.:1106.3; Other:10; IV Piggyback:50] Out: 1075 [Urine:1075]  General appearance: alert, cooperative and appears stated age Abdomen: soft, tenderness in lower abdomen, + guarding, no rebound. Drains in place and overlying dressing intact Extremities: Homans sign is negative, no sign of DVT    Results for orders placed or performed during the hospital encounter of 04/25/15 (from the past 24 hour(s))  Creatinine, serum     Status: None   Collection Time: 05/02/15  5:10 AM  Result Value Ref Range   Creatinine, Ser 0.76 0.44 - 1.00 mg/dL   GFR calc non Af Amer >60 >60 mL/min   GFR calc Af Amer >60 >60 mL/min    Studies/Results: Dg Abd 1 View  04/25/2015  CLINICAL DATA:  Acute epigastric abdominal pain. EXAM: ABDOMEN - 1 VIEW COMPARISON:  None. FINDINGS: The bowel gas pattern is normal. No radio-opaque calculi or other significant radiographic abnormality are seen. IMPRESSION: No evidence of bowel obstruction or ileus. Electronically Signed   By: Lupita Raider, M.D.   On: 04/25/2015 08:54   US Pelvis Complete  04/26/2015  CLINICAL DATA:  Pelvic inflammatory disease. EXAM: TRANSABDOMINAL ULTRASOUND OF PELVIS TECHNIQUE: Transabdominal ultrasound  examination of the pelvis was performed including evaluation of the uterus, ovaries, adnexal regions, and pelvic cul-de-sac. COMPARISON:  Abdominal CT from yesterday FINDINGS: Uterus Measurements: 9 x 6 x 5 cm. Hypoechoic sub serosal mass in the anterior body measuring 32 mm. Endometrium Thickness: 6 mm.  No focal abnormality visualized. Right ovary Not clearly discernible due to tubular structure measuring 5 cm in length by 4 cm in diameter with internal mid level echoes. The length of the dilated tube is underestimated relative to previous CT). Left ovary Not discernible due to tubular structure in the adnexum measuring 7 cm in length by 4 cm in diameter, with internal mid level echoes. Other findings:  Small volume free fluid in the deep pelvis. IMPRESSION: 1. Bilateral hydrosalpinx or pyosalpinx. 2. Ovaries not visualized separate from the tubes. 3. 3 cm sub serosal fibroid. Electronically Signed   By: Marnee Spring M.D.   On: 04/26/2015 11:19   Ct Abdomen Pelvis W Contrast  05/01/2015  ADDENDUM REPORT: 05/01/2015 10:28 ADDENDUM: The amount of contrast given was 100 mL, not 500 mL. Electronically Signed   By: Lupita Raider, M.D.   On: 05/01/2015 10:28  05/01/2015  CLINICAL DATA:  Acute generalized abdominal pain and distention. EXAM: CT ABDOMEN AND PELVIS WITH CONTRAST TECHNIQUE: Multidetector CT imaging of the abdomen and pelvis was performed using the standard protocol following bolus administration of intravenous contrast. CONTRAST:  OMNIPAQUE IOHEXOL 300 MG/ML  SOLN COMPARISON:  None. 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 Visualized lung bases are unremarkable. No gallstones are noted. Lobular hepatic margins are again noted consistent with hepatic cirrhosis. No focal abnormality is noted in the liver. The spleen and pancreas appear normal. Adrenal glands and kidneys appear normal. No  hydronephrosis or renal obstruction is noted. There is no evidence of abdominal aortic aneurysm. The appendix appears normal. There is no evidence of bowel obstruction. Mild adenopathy is noted in porta hepatis region most likely related to hepatic cirrhosis. Probable 3.6 cm fibroid is seen arising from the uterine fundus. Bilateral hydrosalpinges are noted with surrounding free fluid suggesting pelvic inflammatory disease. Urinary bladder is decompressed. IMPRESSION: Hepatic cirrhosis is noted. 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 Probable 3.6 cm uterine fibroid is seen. Bilateral hydrosalpinx is noted in the pelvis with surrounding fluid suggesting pelvic inflammatory disease. Electronically Signed: By: Lupita Raider, M.D. On: 04/25/2015 12:45   Ct Abdomen Pelvis W Contrast  04/29/2015  CLINICAL DATA:  Follow-up tubo ovarian abscess EXAM: CT ABDOMEN AND PELVIS WITH CONTRAST TECHNIQUE: Multidetector CT imaging of the abdomen and pelvis was performed using the standard protocol following bolus administration of intravenous contrast. CONTRAST:  OMNIPAQUE IOHEXOL 300 MG/ML  SOLN COMPARISON:  05/13/2015 and 04/26/2015 FINDINGS: Lung bases are unremarkable. Sagittal images of the spine shows significant disc space flattening with vacuum disc phenomenon endplate sclerotic changes at L4-L5 level. There is about 1.1 cm anterolisthesis L4 on L5 vertebral body. Again noted cirrhosis of the liver there is a cyst in left hepatic lobe measures 1.2 cm. There is mild thickening of the wall of the gallbladder up to 3.6 mm. Trace pericholecystic fluid. No significant change from prior exam. No aortic aneurysm. The pancreas and adrenal glands are unremarkable. Punctate calcifications are noted within spleen probable from prior granulomatous disease. Atherosclerotic calcifications of abdominal aorta and iliac arteries. No aortic  aneurysm. No small bowel obstruction. No free abdominal air. Small amount of pelvic ascites noted in posterior pelvis. Normal appendix is noted in axial image 58. No pericecal inflammation. Again noted myometrial uterine fibroid measures at least 3.3 cm. No calcified gallstones are noted within gallbladder. There is tubular structure containing fluid with mild stranding of surrounding fat in left adnexa left anterior pelvis measures 7.3 x 3.6 cm. Similar tubular structure in right adnexa extending posterior cul-de-sac measures 8.6 by 3.4 cm. This is highly suspicious for bilateral hydrosalpinx or tubo-ovarian abscess. Correlation with GYN exam is recommended. Moderate stool noted within rectum. No distal colonic obstruction. There is no inguinal adenopathy. Kidneys are symmetrical in size and enhancement. No hydronephrosis or hydroureter. Delayed renal images shows bilateral renal symmetrical excretion. Bilateral visualized proximal ureter is unremarkable. IMPRESSION: 1. Again noted bilateral tubular adnexal lesion suspicious for hydrosalpinx or tubo-ovarian abscess. Small amount of pelvic free fluid. 2. Again noted fibroid uterus. 3. Normal appendix.  No pericecal inflammation. 4. No small bowel obstruction. 5. No hydronephrosis or hydroureter. 6. Again noted cirrhosis of the liver. 7. Stable mild thickening of the wall of gallbladder and trace pericholecystic fluid. Electronically Signed   By: Natasha Mead M.D.   On: 04/29/2015 09:49   Ct Image Guided Drainage By Percutaneous Catheter  04/30/2015  INDICATION: History of bilateral tubo-ovarian abscess with progressive symptoms despite intravenous antibiotics. Patient presents today for CT-guided placement of bilateral pelvic percutaneous drainage catheters. EXAM: 1. CT-GUIDED RIGHT TRANS GLUTEAL APPROACH PERCUTANEOUS DRAINAGE CATHETER PLACEMENT 2. CT-GUIDED LEFT LOWER  ANTERIOR PELVIC APPROACH PERCUTANEOUS DRAINAGE CATHETER PLACEMENT COMPARISON:  CT abdomen pelvis  - 04/29/2015; 04/25/2015 MEDICATIONS: The patient is currently admitted to the hospital and receiving intravenous antibiotics. The antibiotics were administered within an appropriate time frame prior to the initiation of the procedure. ANESTHESIA/SEDATION: Fentanyl 125 mcg IV; Versed 7 mg IV Total Moderate Sedation time 57 minutes CONTRAST:  None COMPLICATIONS: None immediate PROCEDURE: Informed written consent was obtained from the patient after a discussion of the risks, benefits and alternatives to treatment. The patient was initially placed prone on the CT gantry and a pre procedural CT was performed re-demonstrating the known abscess/fluid collection within the right hemipelvis with dominant component measuring approximately 4.3 x 6.7 cm (image 28, series 2). The initial portion of the procedure was planned. A timeout was performed prior to the initiation of the procedure. The skin overlying the right buttocks was prepped and draped in the usual sterile fashion. The overlying soft tissues were anesthetized with 1% lidocaine with epinephrine. Appropriate trajectory was planned with the use of a 22 gauge spinal needle. An 18 gauge trocar needle was advanced into the abscess/fluid collection and a short Amplatz super stiff wire was coiled within the collection. Appropriate positioning was confirmed with a limited CT scan. The tract was serially dilated allowing placement of a 10 [place] all-purpose drainage catheter. Appropriate positioning was confirmed with a limited postprocedural CT scan. Approximately 90 Ml of purulent fluid was aspirated. A sample of aspirated fluid from the right pelvic fluid collection was sent to the laboratory for analysis. The tube was connected to a drainage bag and sutured in place. A dressing was placed. The patient was then positioned supine on the CT gantry with CT imaging demonstrating an grossly unchanged size location and appearance of the serpiginous left-sided component of the  tumor very abscess with dominant component measuring approximately 7.4 x 2.6 cm (image 18, series 6). This portion of the procedure was planned. The skin overlying the left lower abdomen /upper pelvis was prepped and draped in the usual sterile fashion. The overlying soft tissues were anesthetized with 1% lidocaine with epinephrine. Appropriate trajectory was planned with the use of a 22 gauge spinal needle. An 18 gauge trocar needle was advanced into the abscess/fluid collection and a short Amplatz super stiff wire was coiled within the collection. Appropriate positioning was confirmed with a limited CT scan. The tract was serially dilated allowing placement of a 10 [place] all-purpose drainage catheter. Appropriate positioning was confirmed with a limited postprocedural CT scan. Approximately 35 ml of purulent fluid was aspirated. A sample of the aspirated fluid from the left lower abdomen/pelvic fluid collection was sent to the laboratory for analysis. The tube was connected to a drainage bag and sutured in place. A dressing was placed. The patient tolerated both procedures well without immediate post procedural complication. IMPRESSION: 1. Successful CT guided placement of a 10 [place] all purpose drain catheter into the right lower pelvis via right trans gluteal approach with aspiration of 90 mL of purulent fluid. Samples were sent to the laboratory as requested by the ordering clinical team. 2. Successful CT guided placement of a 10 [place] all purpose drain catheter into the left lower abdomen / upper pelvis with aspiration of 35 mL of purulent fluid. Samples were sent to the laboratory as requested by the ordering clinical team. Electronically Signed   By: Simonne Come M.D.   On: 04/30/2015 16:25   Ct Image Guided Drainage Percut Cath  Peritoneal Retroperit  04/30/2015  INDICATION: History of bilateral tubo-ovarian abscess with progressive symptoms despite intravenous antibiotics. Patient presents today for  CT-guided placement of bilateral pelvic percutaneous drainage catheters. EXAM: 1. CT-GUIDED RIGHT TRANS GLUTEAL APPROACH PERCUTANEOUS DRAINAGE CATHETER PLACEMENT 2. CT-GUIDED LEFT LOWER ANTERIOR PELVIC APPROACH PERCUTANEOUS DRAINAGE CATHETER PLACEMENT COMPARISON:  CT abdomen pelvis - 04/29/2015; 04/25/2015 MEDICATIONS: The patient is currently admitted to the hospital and receiving intravenous antibiotics. The antibiotics were administered within an appropriate time frame prior to the initiation of the procedure. ANESTHESIA/SEDATION: Fentanyl 125 mcg IV; Versed 7 mg IV Total Moderate Sedation time 57 minutes CONTRAST:  None COMPLICATIONS: None immediate PROCEDURE: Informed written consent was obtained from the patient after a discussion of the risks, benefits and alternatives to treatment. The patient was initially placed prone on the CT gantry and a pre procedural CT was performed re-demonstrating the known abscess/fluid collection within the right hemipelvis with dominant component measuring approximately 4.3 x 6.7 cm (image 28, series 2). The initial portion of the procedure was planned. A timeout was performed prior to the initiation of the procedure. The skin overlying the right buttocks was prepped and draped in the usual sterile fashion. The overlying soft tissues were anesthetized with 1% lidocaine with epinephrine. Appropriate trajectory was planned with the use of a 22 gauge spinal needle. An 18 gauge trocar needle was advanced into the abscess/fluid collection and a short Amplatz super stiff wire was coiled within the collection. Appropriate positioning was confirmed with a limited CT scan. The tract was serially dilated allowing placement of a 10 [place] all-purpose drainage catheter. Appropriate positioning was confirmed with a limited postprocedural CT scan. Approximately 90 Ml of purulent fluid was aspirated. A sample of aspirated fluid from the right pelvic fluid collection was sent to the laboratory  for analysis. The tube was connected to a drainage bag and sutured in place. A dressing was placed. The patient was then positioned supine on the CT gantry with CT imaging demonstrating an grossly unchanged size location and appearance of the serpiginous left-sided component of the tumor very abscess with dominant component measuring approximately 7.4 x 2.6 cm (image 18, series 6). This portion of the procedure was planned. The skin overlying the left lower abdomen /upper pelvis was prepped and draped in the usual sterile fashion. The overlying soft tissues were anesthetized with 1% lidocaine with epinephrine. Appropriate trajectory was planned with the use of a 22 gauge spinal needle. An 18 gauge trocar needle was advanced into the abscess/fluid collection and a short Amplatz super stiff wire was coiled within the collection. Appropriate positioning was confirmed with a limited CT scan. The tract was serially dilated allowing placement of a 10 [place] all-purpose drainage catheter. Appropriate positioning was confirmed with a limited postprocedural CT scan. Approximately 35 ml of purulent fluid was aspirated. A sample of the aspirated fluid from the left lower abdomen/pelvic fluid collection was sent to the laboratory for analysis. The tube was connected to a drainage bag and sutured in place. A dressing was placed. The patient tolerated both procedures well without immediate post procedural complication. IMPRESSION: 1. Successful CT guided placement of a 10 [place] all purpose drain catheter into the right lower pelvis via right trans gluteal approach with aspiration of 90 mL of purulent fluid. Samples were sent to the laboratory as requested by the ordering clinical team. 2. Successful CT guided placement of a 10 French all purpose drain catheter into the left lower abdomen / upper pelvis with aspiration of 35 mL of purulent fluid. Samples  were sent to the laboratory as requested by the ordering clinical team.  Electronically Signed   By: Simonne Come M.D.   On: 04/30/2015 16:25    Scheduled Meds: . clindamycin (CLEOCIN) IV  900 mg Intravenous Q8H  . enoxaparin (LOVENOX) injection  40 mg Subcutaneous Q24H  . gentamicin  7 mg/kg (Adjusted) Intravenous Q24H  . prenatal multivitamin  1 tablet Oral Q1200   Continuous Infusions: . lactated ringers 100 mL/hr at 05/01/15 2217   PRN Meds:alum & mag hydroxide-simeth, HYDROmorphone (DILAUDID) injection, HYDROmorphone, ibuprofen, ondansetron **OR** ondansetron (ZOFRAN) IV, polyethylene glycol, traMADol, zolpidem  Assessment/Plan: Active Problems:   PID (acute pelvic inflammatory disease)   54 yo with bilateral TOA s/p IR drainage 1) Continue IV abx, gent and clinda 2) Follow up abscess cultures 3) Discuss with IR D/C planning Continue current care    LOS: 7 days   Destony Prevost, MD 05/02/2015 6:21 AM

## 2015-05-02 NOTE — Progress Notes (Signed)
I have spoke with the patient's RN today and she states patient is doing well. Her drain(s) are putting out 10-15 cc/24 hrs of serous colored fluid. The patient is afebrile, no new CBC since 04/30/15. Cx GNR. Case discussed with Dr. Fredia SorrowYamagata today and he recommends to continue the drains. After discharge would recommend BID flushes with 5 ml sterile saline to each drain and monitor daily output. IR clinic will call the patient to setup a F/U appointment and CT pelvis with IV contrast in approximately 1 (1/2)- 2 weeks. Our clinic number is [phone].   Pattricia BossKoreen Katelinn Justice PA-C Interventional Radiology  05/02/15  4:05 PM

## 2015-05-03 LAB — CULTURE, ROUTINE-ABSCESS

## 2015-05-03 NOTE — Progress Notes (Signed)
Patient ID: Rachel Hudson, female   DOB: 1960/07/15, 54 y.o.   MRN: [id number] Patient admitted with bilateral TOA on 04/25/15, s/p IR drain placement on 04/30/15.  Transitioned from IV Gentamicin and Clindamycin to oral Doxycyline and Metronidazole on 05/02/15  Subjective: Patient reports pain is well controlled with medication. Desires removal of her drains given markedly decreased output over the last fcouple days.  Objective: Vital signs in last 24 hours: Temp:  [98.6 F (37 C)-99.9 F (37.7 C)] 98.6 F (37 C) (12/10 0457) Pulse Rate:  [66-79] 73 (12/10 0457) Resp:  [14-18] 16 (12/10 0457) BP: (111-133)/(62-78) 111/62 mmHg (12/10 0457) SpO2:  [97 %-100 %] 100 % (12/10 0457)  Intake/Output from previous day: 12/09 0701 - 12/10 0700 In: 1761.7 [I.V.:1731.7] Out: 31.9 [Drains:31.9] including 30 ml of NS flushes. Net output was < 2 ml yesterday. Was 16.5 ml the previous day. Intake/Output this shift: General appearance: alert, cooperative and appears stated age Abdomen: soft, tenderness in lower abdomen, + guarding, no rebound. Drains in place and overlying dressing intact Extremities: Homans sign is negative, no sign of DVT    No results found for this or any previous visit (from the past 24 hour(s)).  Studies/Results: Dg Abd 1 View  04/25/2015  CLINICAL DATA:  Acute epigastric abdominal pain. EXAM: ABDOMEN - 1 VIEW COMPARISON:  None. FINDINGS: The bowel gas pattern is normal. No radio-opaque calculi or other significant radiographic abnormality are seen. IMPRESSION: No evidence of bowel obstruction or ileus. Electronically Signed   By: Lupita Raider, M.D.   On: 04/25/2015 08:54   US Pelvis Complete  04/26/2015  CLINICAL DATA:  Pelvic inflammatory disease. EXAM: TRANSABDOMINAL ULTRASOUND OF PELVIS TECHNIQUE: Transabdominal ultrasound examination of the pelvis was performed including evaluation of the uterus, ovaries, adnexal regions, and pelvic cul-de-sac. COMPARISON:  Abdominal CT  from yesterday FINDINGS: Uterus Measurements: 9 x 6 x 5 cm. Hypoechoic sub serosal mass in the anterior body measuring 32 mm. Endometrium Thickness: 6 mm.  No focal abnormality visualized. Right ovary Not clearly discernible due to tubular structure measuring 5 cm in length by 4 cm in diameter with internal mid level echoes. The length of the dilated tube is underestimated relative to previous CT). Left ovary Not discernible due to tubular structure in the adnexum measuring 7 cm in length by 4 cm in diameter, with internal mid level echoes. Other findings:  Small volume free fluid in the deep pelvis. IMPRESSION: 1. Bilateral hydrosalpinx or pyosalpinx. 2. Ovaries not visualized separate from the tubes. 3. 3 cm sub serosal fibroid. Electronically Signed   By: Marnee Spring M.D.   On: 04/26/2015 11:19   Ct Abdomen Pelvis W Contrast  05/01/2015  ADDENDUM REPORT: 05/01/2015 10:28 ADDENDUM: The amount of contrast given was 100 mL, not 500 mL. Electronically Signed   By: Lupita Raider, M.D.   On: 05/01/2015 10:28  05/01/2015  CLINICAL DATA:  Acute generalized abdominal pain and distention. EXAM: CT ABDOMEN AND PELVIS WITH CONTRAST TECHNIQUE: Multidetector CT imaging of the abdomen and pelvis was performed using the standard protocol following bolus administration of intravenous contrast. CONTRAST:  OMNIPAQUE IOHEXOL 300 MG/ML  SOLN COMPARISON:  None. 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 Visualized lung bases are unremarkable. No gallstones are noted. Lobular hepatic margins are again noted consistent with hepatic cirrhosis. No focal abnormality is noted in the liver. The spleen and pancreas appear normal.  Adrenal glands and kidneys appear normal. No hydronephrosis or renal obstruction is noted. There is no evidence of abdominal aortic aneurysm. The appendix appears normal. There is no evidence of bowel  obstruction. Mild adenopathy is noted in porta hepatis region most likely related to hepatic cirrhosis. Probable 3.6 cm fibroid is seen arising from the uterine fundus. Bilateral hydrosalpinges are noted with surrounding free fluid suggesting pelvic inflammatory disease. Urinary bladder is decompressed. IMPRESSION: Hepatic cirrhosis is noted. 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 Probable 3.6 cm uterine fibroid is seen. Bilateral hydrosalpinx is noted in the pelvis with surrounding fluid suggesting pelvic inflammatory disease. Electronically Signed: By: Lupita Raider, M.D. On: 04/25/2015 12:45   Ct Abdomen Pelvis W Contrast  04/29/2015  CLINICAL DATA:  Follow-up tubo ovarian abscess EXAM: CT ABDOMEN AND PELVIS WITH CONTRAST TECHNIQUE: Multidetector CT imaging of the abdomen and pelvis was performed using the standard protocol following bolus administration of intravenous contrast. CONTRAST:  OMNIPAQUE IOHEXOL 300 MG/ML  SOLN COMPARISON:  05/13/2015 and 04/26/2015 FINDINGS: Lung bases are unremarkable. Sagittal images of the spine shows significant disc space flattening with vacuum disc phenomenon endplate sclerotic changes at L4-L5 level. There is about 1.1 cm anterolisthesis L4 on L5 vertebral body. Again noted cirrhosis of the liver there is a cyst in left hepatic lobe measures 1.2 cm. There is mild thickening of the wall of the gallbladder up to 3.6 mm. Trace pericholecystic fluid. No significant change from prior exam. No aortic aneurysm. The pancreas and adrenal glands are unremarkable. Punctate calcifications are noted within spleen probable from prior granulomatous disease. Atherosclerotic calcifications of abdominal aorta and iliac arteries. No aortic aneurysm. No small bowel obstruction. No free abdominal air. Small amount of pelvic ascites noted in posterior pelvis. Normal appendix is noted in axial image  58. No pericecal inflammation. Again noted myometrial uterine fibroid measures at least 3.3 cm. No calcified gallstones are noted within gallbladder. There is tubular structure containing fluid with mild stranding of surrounding fat in left adnexa left anterior pelvis measures 7.3 x 3.6 cm. Similar tubular structure in right adnexa extending posterior cul-de-sac measures 8.6 by 3.4 cm. This is highly suspicious for bilateral hydrosalpinx or tubo-ovarian abscess. Correlation with GYN exam is recommended. Moderate stool noted within rectum. No distal colonic obstruction. There is no inguinal adenopathy. Kidneys are symmetrical in size and enhancement. No hydronephrosis or hydroureter. Delayed renal images shows bilateral renal symmetrical excretion. Bilateral visualized proximal ureter is unremarkable. IMPRESSION: 1. Again noted bilateral tubular adnexal lesion suspicious for hydrosalpinx or tubo-ovarian abscess. Small amount of pelvic free fluid. 2. Again noted fibroid uterus. 3. Normal appendix.  No pericecal inflammation. 4. No small bowel obstruction. 5. No hydronephrosis or hydroureter. 6. Again noted cirrhosis of the liver. 7. Stable mild thickening of the wall of gallbladder and trace pericholecystic fluid. Electronically Signed   By: Natasha Mead M.D.   On: 04/29/2015 09:49   Ct Image Guided Drainage By Percutaneous Catheter  04/30/2015  INDICATION: History of bilateral tubo-ovarian abscess with progressive symptoms despite intravenous antibiotics. Patient presents today for CT-guided placement of bilateral pelvic percutaneous drainage catheters. EXAM: 1. CT-GUIDED RIGHT TRANS GLUTEAL APPROACH PERCUTANEOUS DRAINAGE CATHETER PLACEMENT 2. CT-GUIDED LEFT LOWER ANTERIOR PELVIC APPROACH PERCUTANEOUS DRAINAGE CATHETER PLACEMENT COMPARISON:  CT abdomen pelvis - 04/29/2015; 04/25/2015 MEDICATIONS: The patient is currently admitted to the hospital and receiving intravenous antibiotics. The antibiotics were  administered within an appropriate time  frame prior to the initiation of the procedure. ANESTHESIA/SEDATION: Fentanyl 125 mcg IV; Versed 7 mg IV Total Moderate Sedation time 57 minutes CONTRAST:  None COMPLICATIONS: None immediate PROCEDURE: Informed written consent was obtained from the patient after a discussion of the risks, benefits and alternatives to treatment. The patient was initially placed prone on the CT gantry and a pre procedural CT was performed re-demonstrating the known abscess/fluid collection within the right hemipelvis with dominant component measuring approximately 4.3 x 6.7 cm (image 28, series 2). The initial portion of the procedure was planned. A timeout was performed prior to the initiation of the procedure. The skin overlying the right buttocks was prepped and draped in the usual sterile fashion. The overlying soft tissues were anesthetized with 1% lidocaine with epinephrine. Appropriate trajectory was planned with the use of a 22 gauge spinal needle. An 18 gauge trocar needle was advanced into the abscess/fluid collection and a short Amplatz super stiff wire was coiled within the collection. Appropriate positioning was confirmed with a limited CT scan. The tract was serially dilated allowing placement of a 10 [place] all-purpose drainage catheter. Appropriate positioning was confirmed with a limited postprocedural CT scan. Approximately 90 Ml of purulent fluid was aspirated. A sample of aspirated fluid from the right pelvic fluid collection was sent to the laboratory for analysis. The tube was connected to a drainage bag and sutured in place. A dressing was placed. The patient was then positioned supine on the CT gantry with CT imaging demonstrating an grossly unchanged size location and appearance of the serpiginous left-sided component of the tumor very abscess with dominant component measuring approximately 7.4 x 2.6 cm (image 18, series 6). This portion of the procedure was planned. The  skin overlying the left lower abdomen /upper pelvis was prepped and draped in the usual sterile fashion. The overlying soft tissues were anesthetized with 1% lidocaine with epinephrine. Appropriate trajectory was planned with the use of a 22 gauge spinal needle. An 18 gauge trocar needle was advanced into the abscess/fluid collection and a short Amplatz super stiff wire was coiled within the collection. Appropriate positioning was confirmed with a limited CT scan. The tract was serially dilated allowing placement of a 10 [place] all-purpose drainage catheter. Appropriate positioning was confirmed with a limited postprocedural CT scan. Approximately 35 ml of purulent fluid was aspirated. A sample of the aspirated fluid from the left lower abdomen/pelvic fluid collection was sent to the laboratory for analysis. The tube was connected to a drainage bag and sutured in place. A dressing was placed. The patient tolerated both procedures well without immediate post procedural complication. IMPRESSION: 1. Successful CT guided placement of a 10 [place] all purpose drain catheter into the right lower pelvis via right trans gluteal approach with aspiration of 90 mL of purulent fluid. Samples were sent to the laboratory as requested by the ordering clinical team. 2. Successful CT guided placement of a 10 [place] all purpose drain catheter into the left lower abdomen / upper pelvis with aspiration of 35 mL of purulent fluid. Samples were sent to the laboratory as requested by the ordering clinical team. Electronically Signed   By: Simonne Come M.D.   On: 04/30/2015 16:25   Ct Image Guided Drainage Percut Cath  Peritoneal Retroperit  04/30/2015  INDICATION: History of bilateral tubo-ovarian abscess with progressive symptoms despite intravenous antibiotics. Patient presents today for CT-guided placement of bilateral pelvic percutaneous drainage catheters. EXAM: 1. CT-GUIDED RIGHT TRANS GLUTEAL APPROACH PERCUTANEOUS DRAINAGE  CATHETER PLACEMENT  2. CT-GUIDED LEFT LOWER ANTERIOR PELVIC APPROACH PERCUTANEOUS DRAINAGE CATHETER PLACEMENT COMPARISON:  CT abdomen pelvis - 04/29/2015; 04/25/2015 MEDICATIONS: The patient is currently admitted to the hospital and receiving intravenous antibiotics. The antibiotics were administered within an appropriate time frame prior to the initiation of the procedure. ANESTHESIA/SEDATION: Fentanyl 125 mcg IV; Versed 7 mg IV Total Moderate Sedation time 57 minutes CONTRAST:  None COMPLICATIONS: None immediate PROCEDURE: Informed written consent was obtained from the patient after a discussion of the risks, benefits and alternatives to treatment. The patient was initially placed prone on the CT gantry and a pre procedural CT was performed re-demonstrating the known abscess/fluid collection within the right hemipelvis with dominant component measuring approximately 4.3 x 6.7 cm (image 28, series 2). The initial portion of the procedure was planned. A timeout was performed prior to the initiation of the procedure. The skin overlying the right buttocks was prepped and draped in the usual sterile fashion. The overlying soft tissues were anesthetized with 1% lidocaine with epinephrine. Appropriate trajectory was planned with the use of a 22 gauge spinal needle. An 18 gauge trocar needle was advanced into the abscess/fluid collection and a short Amplatz super stiff wire was coiled within the collection. Appropriate positioning was confirmed with a limited CT scan. The tract was serially dilated allowing placement of a 10 [place] all-purpose drainage catheter. Appropriate positioning was confirmed with a limited postprocedural CT scan. Approximately 90 Ml of purulent fluid was aspirated. A sample of aspirated fluid from the right pelvic fluid collection was sent to the laboratory for analysis. The tube was connected to a drainage bag and sutured in place. A dressing was placed. The patient was then positioned supine on  the CT gantry with CT imaging demonstrating an grossly unchanged size location and appearance of the serpiginous left-sided component of the tumor very abscess with dominant component measuring approximately 7.4 x 2.6 cm (image 18, series 6). This portion of the procedure was planned. The skin overlying the left lower abdomen /upper pelvis was prepped and draped in the usual sterile fashion. The overlying soft tissues were anesthetized with 1% lidocaine with epinephrine. Appropriate trajectory was planned with the use of a 22 gauge spinal needle. An 18 gauge trocar needle was advanced into the abscess/fluid collection and a short Amplatz super stiff wire was coiled within the collection. Appropriate positioning was confirmed with a limited CT scan. The tract was serially dilated allowing placement of a 10 [place] all-purpose drainage catheter. Appropriate positioning was confirmed with a limited postprocedural CT scan. Approximately 35 ml of purulent fluid was aspirated. A sample of the aspirated fluid from the left lower abdomen/pelvic fluid collection was sent to the laboratory for analysis. The tube was connected to a drainage bag and sutured in place. A dressing was placed. The patient tolerated both procedures well without immediate post procedural complication. IMPRESSION: 1. Successful CT guided placement of a 10 [place] all purpose drain catheter into the right lower pelvis via right trans gluteal approach with aspiration of 90 mL of purulent fluid. Samples were sent to the laboratory as requested by the ordering clinical team. 2. Successful CT guided placement of a 10 French all purpose drain catheter into the left lower abdomen / upper pelvis with aspiration of 35 mL of purulent fluid. Samples were sent to the laboratory as requested by the ordering clinical team. Electronically Signed   By: Simonne ComeJohn  Watts M.D.   On: 04/30/2015 16:25    Scheduled Meds: . doxycycline  100 mg  Oral Q12H  . enoxaparin  (LOVENOX) injection  40 mg Subcutaneous Q24H  . metroNIDAZOLE  500 mg Oral 3 times per day  . prenatal multivitamin  1 tablet Oral Q1200   Continuous Infusions: . lactated ringers 100 mL/hr at 05/02/15 2219   PRN Meds:alum & mag hydroxide-simeth, HYDROmorphone (DILAUDID) injection, HYDROmorphone, ibuprofen, ondansetron **OR** ondansetron (ZOFRAN) IV, polyethylene glycol, traMADol, zolpidem  Assessment/Plan: Principal Problem:   Tubo-ovarian abscess Active Problems:   PID (acute pelvic inflammatory disease)  54 yo with bilateral TOA s/p IR drainage 1) Continue oral antibiotics, doxycycline and metronidazole.  Analgesia as needed. 2) Follow up abscess cultures; preliminary results show abundant GNR 3) Will start disposition planning; will talk to IR about possible drain removal prior to discharge. If drains stay in place, will need home health care 4) Continue current inpatient care for now     LOS: 8 days   Briya Lookabaugh A, MD 05/03/2015 7:43 AM

## 2015-05-03 NOTE — Progress Notes (Signed)
Spoke with Jeannie Crutchfield CNM concerning pt d/c status 12/11 and HH needs for drains. Informed by CNM it will be taken care off. Will continue to monitor.

## 2015-05-03 NOTE — Care Management Note (Addendum)
Case Management Note  Patient Details  Name: Rachel Hudson MRN: [id number] Date of Birth: 01/10/1961  Subjective/Objective:   Bil TOA 04/25/2015 with Drain placement by IR. Anticipate discharge to home 05/04/2015.  [facility] flush Daily x 10d. (Faxed to office as it will be charity [facility] [phone]).  This will most likely be for instruction at most 1-2  Times and then pt can do this on her own. Spoke with Victorino DikeJennifer,  RN who is caring for her today so she can begin this process prior to discharge.                 Action/Plan: Anticipate discharge home 05/04/2015 . No further CM needs but will be available should additional discharge needs arise.   Expected Discharge Date:  04/28/15               Expected Discharge Plan:     In-House Referral:     Discharge planning Services  CM Consult  Post Acute Care Choice:    Choice offered to:  NA (No [facility]or source; [facility]  DME Arranged:    DME Agency:     HH Arranged:  RN (Drain Flushes) HH Agency:  [facility]  Status of Service:  Completed, signed off  Medicare Important Message Given:    Date Medicare IM Given:    Medicare IM give by:    Date Additional Medicare IM Given:    Additional Medicare Important Message give by:     If discussed at Long Length of Stay Meetings, dates discussed:    Additional Comments:  Rachel Hudson, Rachel Mccarey M, RN 05/03/2015, 3:40 PM

## 2015-05-03 NOTE — Progress Notes (Signed)
Subjective: Patient reports that she desires removal of drains, but accepts IR's best recommendation that drains stay in until CT in 10 days, and removal after IR confirms adequacy of drainage..   Pt home has no heat today, so will keep til a.m and arrange home health saline flushes bid x 10d   Objective: I have reviewed patient's vital signs, medications and microbiology. Enterobacter S to all except cephalosporins General: alert, cooperative and no distress GI: soft, non-tender; bowel sounds normal; no masses,  no organomegaly and drains in place LLQ and right gluteal. After discharge would recommend BID flushes with 5 ml sterile saline to each drain and monitor daily output. IR clinic will call the patient to setup a F/U appointment and CT pelvis with IV contrast in approximately 1 (1/2)- 2 weeks. Our clinic number is [phone].  See copied IR consult note above.  Assessment/Plan: Improving Bilateral TOA's , s/p IR drainage. Arrange home health flushes of drain F/u with IR clinic    LOS: 8 days    Rachel Hudson V 05/03/2015, 9:41 AM

## 2015-05-04 LAB — CULTURE, ROUTINE-ABSCESS

## 2015-05-04 MED ORDER — METRONIDAZOLE 500 MG PO TABS: 500.0000 mg | ORAL_TABLET | Freq: Two times a day (BID) | ORAL | Status: DC

## 2015-05-04 MED ORDER — CIPROFLOXACIN HCL 500 MG PO TABS
500.0000 mg | ORAL_TABLET | Freq: Once | ORAL | Status: AC
  Administered 2015-05-04: 500 mg via ORAL
  Filled 2015-05-04: qty 1

## 2015-05-04 MED ORDER — POLYETHYLENE GLYCOL 3350 17 G PO PACK: 17.0000 g | PACK | Freq: Every day | ORAL | Status: DC | PRN

## 2015-05-04 MED ORDER — CIPROFLOXACIN HCL 500 MG PO TABS: 500.0000 mg | ORAL_TABLET | Freq: Two times a day (BID) | ORAL | Status: DC

## 2015-05-04 MED ORDER — TRAMADOL HCL 50 MG PO TABS: 50.0000 mg | ORAL_TABLET | Freq: Four times a day (QID) | ORAL | Status: DC | PRN

## 2015-05-04 MED ORDER — DOXYCYCLINE HYCLATE 100 MG PO CAPS: 100.0000 mg | ORAL_CAPSULE | Freq: Two times a day (BID) | ORAL | Status: DC

## 2015-05-04 NOTE — Plan of Care (Signed)
Problem: Education: Goal: Understanding of sexual limitations or changes related to disease process or condition will improve Outcome: Completed/Met Date Met:  05/04/15 Pt was provided with d/c instructions/handout including sexual limitations.        

## 2015-05-04 NOTE — Discharge Instructions (Signed)

## 2015-05-04 NOTE — Discharge Summary (Addendum)
Physician Discharge Summary  Patient ID: Rachel CottaFrances M Hudson MRN: [id number] DOB/AGE: 10-11-1960 54 y.o.  Admit date: 04/25/2015 Discharge date: 05/04/2015  Admission Diagnoses: Bilateral tubo-ovarian abscess, acute PID, history of hepatitis, treated and resolved  Discharge Diagnoses:  Principal Problem:   Tubo-ovarian abscess Active Problems:   PID (acute pelvic inflammatory disease)  history of hepatitis C, treated and resolved  Discharged Condition: good  Hospital Course: This 54 year old female gravida 0 para 0 LMP 12 1 was admitted 12 2 after being seen in [facility] emergency room [facility] CT showing bilateral hydrosalpinx surrounding pelvic fluid she had been seen there for anorexia and nausea and vomiting. She's had no GYN care in many years. She was admitted and started on antibiotics. Her fevers persisted and she underwent IR drainage on 04/30/15.  On 05/04/15, she has been afebrile for over 3 days and was deemed stable for discharge to home on oral antibiotics.  Consults: Interventional radiology, for placement of percutaneous drains in both adnexa  Significant Diagnostic Studies: labs: IR drain culture was positive for Enterobacter only and microbiology: wound culture: positive for Enterobacter  Treatments: IV hydration and antibiotics: gentamycin and clindamycin  Discharge Exam: Blood pressure 118/72, pulse 69, temperature 98.6 F (37 C), temperature source Oral, resp. rate 14, height 5\' 1"  (1.549 m), weight 69.429 kg (153 lb 1 oz), last menstrual period 04/24/2015, SpO2 96 %. General appearance: alert, cooperative and no distress Head: Normocephalic, without obvious abnormality, atraumatic GI: soft, non-tender; bowel sounds normal; no masses,  no organomegaly and IR drain in left lower quadrant draining the left adnexa, with transgluteal IR placed a drain in the right hemipelvis Pelvic: external genitalia normal and No pelvic pain  Disposition: 01-Home or Self  Care  Discharge Instructions    Call MD for:  persistant nausea and vomiting    Complete by:  As directed      Call MD for:  severe uncontrolled pain    Complete by:  As directed      Call MD for:  temperature >100.4    Complete by:  As directed      Diet - low sodium heart healthy    Complete by:  As directed      Increase activity slowly    Complete by:  As directed      Sexual Activity Restrictions    Complete by:  As directed   Until seen in follow-up at the GYN clinic            Medication List    TAKE these medications        amLODipine 10 MG tablet  Commonly known as:  NORVASC  Take 10 mg by mouth daily.     ciprofloxacin 500 MG tablet  Commonly known as:  CIPRO  Take 1 tablet (500 mg total) by mouth 2 (two) times daily.     FISH OIL PO  Take 1-2 capsules by mouth.     gabapentin 800 MG tablet  Commonly known as:  NEURONTIN  Take 1,200 mg by mouth 3 (three) times daily.     metroNIDAZOLE 500 MG tablet  Commonly known as:  FLAGYL  Take 1 tablet (500 mg total) by mouth 2 (two) times daily.     nortriptyline 25 MG capsule  Commonly known as:  PAMELOR  Take 25 mg by mouth at bedtime.     polyethylene glycol packet  Commonly known as:  MIRALAX / GLYCOLAX  Take 17 g by mouth daily as  needed for mild constipation.     traMADol 50 MG tablet  Commonly known as:  ULTRAM  Take 1 tablet (50 mg total) by mouth every 6 (six) hours as needed for moderate pain.           Follow-up Information    Follow up with [facility].   Why:  HHRN will come to assist with Drain flushes.    Contact information:   [address] [place] [address] [phone]       Follow up with [facility] In 4 weeks.   Why:  proof of cure, As needed, If symptoms worsen   Contact information:   [address] [id number] mc [place] [address] [phone]      Follow up with [facility] In 10 days.   Specialty:  Radiology   Why:  For suture removal, and drain removal after u/s. this clinic should contact you.   Contact information:   [address] [id number] mc [place] [address] [phone]      Signed: Tilda Burrow, MD 05/04/2015, 9:04 AM    Addendum 05/04/2015 10:55 AM:  Pharmacy called me and felt that her initial discharge antibiotics would not be good coverage for Enterobacter.  Doxycycline discontinued, Ciprofloxacin added as per their recommendations.  Patient will be on Ciprofloxacin and Metronidazole for next ten days.  Changes made in her medication reconciliation.  Jaynie Collins, MD, FACOG Attending Obstetrician & Gynecologist, [facility], [facility]

## 2015-05-04 NOTE — Progress Notes (Signed)
Pt and SOs (2men) at bedside verbalize understanding of d/c instructions, medications, follow up appts and when to seek medical attention. Pts IV was removed by NT without complications. Pt has no questions at this time. I extensively reviewed care of drains with pt and the 2 caregivers at her bedside. I also provided written instructions for caring for the drains. Pt was d/c to main entrance via wheelchair accompanied by NT. Pts SOs will be driving her home and helping to provide care when [facility] not available to do so. Sheryn BisonGordon, Marius Betts Warner

## 2015-05-05 ENCOUNTER — Other Ambulatory Visit: Payer: Self-pay | Admitting: Radiology

## 2015-05-05 DIAGNOSIS — N7093 Salpingitis and oophoritis, unspecified: Secondary | ICD-10-CM

## 2015-05-06 ENCOUNTER — Other Ambulatory Visit: Payer: Self-pay | Admitting: Interventional Radiology

## 2015-05-06 DIAGNOSIS — N7093 Salpingitis and oophoritis, unspecified: Secondary | ICD-10-CM

## 2015-05-08 ENCOUNTER — Ambulatory Visit (HOSPITAL_BASED_OUTPATIENT_CLINIC_OR_DEPARTMENT_OTHER): Payer: Self-pay | Admitting: Physical Medicine & Rehabilitation

## 2015-05-08 ENCOUNTER — Encounter: Payer: Self-pay | Attending: Physical Medicine & Rehabilitation

## 2015-05-08 ENCOUNTER — Encounter: Payer: Self-pay | Admitting: Physical Medicine & Rehabilitation

## 2015-05-08 VITALS — BP 156/95 | HR 88 | Resp 12

## 2015-05-08 DIAGNOSIS — G5601 Carpal tunnel syndrome, right upper limb: Secondary | ICD-10-CM | POA: Insufficient documentation

## 2015-05-08 DIAGNOSIS — M5441 Lumbago with sciatica, right side: Secondary | ICD-10-CM | POA: Insufficient documentation

## 2015-05-08 DIAGNOSIS — G5603 Carpal tunnel syndrome, bilateral upper limbs: Secondary | ICD-10-CM

## 2015-05-08 DIAGNOSIS — G5602 Carpal tunnel syndrome, left upper limb: Secondary | ICD-10-CM | POA: Insufficient documentation

## 2015-05-08 DIAGNOSIS — M5442 Lumbago with sciatica, left side: Secondary | ICD-10-CM | POA: Insufficient documentation

## 2015-05-08 NOTE — Progress Notes (Signed)
54 year old female with bilateral hand pain and numbness in the fingers pain in the wrist.  Tried oral steroids without much relief.  Procedure: Bilateral carpal tunnel injection  Informed consent was obtained after scrubbing risk and benefits of procedure the patient is include bleeding bruising infection she likes proceed and has given written consent. Patient placed in seated position area just medial to the flexor carpi radialis tendon was marked and prepped with Betadine entered with a 30-gauge 1/2 inch needle 0.25 mL lidocaine 1% injected into each side then a separate needle 30-gauge 1/2 inch inserted and 0.25 mL of Celestone 6 mg per mL injected. Patient tolerated procedure well. Post procedure instructions given    Patient had very good results from lumbar injections at this time does not need repeat.  I do estimate that she'll need repeat injection of her carpal tunnels in about 3 months

## 2015-05-08 NOTE — Patient Instructions (Signed)
Please call to schedule repeat back injection if your back starts flaring up again

## 2015-05-16 ENCOUNTER — Ambulatory Visit (HOSPITAL_COMMUNITY)
Admission: RE | Admit: 2015-05-16 | Discharge: 2015-05-16 | Disposition: A | Discharge status: Home / Self Care | Payer: Self-pay | Source: Ambulatory Visit | Attending: Interventional Radiology | Admitting: Interventional Radiology

## 2015-05-16 ENCOUNTER — Encounter (HOSPITAL_COMMUNITY): Payer: Self-pay

## 2015-05-16 ENCOUNTER — Ambulatory Visit (HOSPITAL_COMMUNITY)
Admission: RE | Admit: 2015-05-16 | Discharge: 2015-05-16 | Disposition: A | Discharge status: Home / Self Care | Payer: Self-pay | Source: Ambulatory Visit | Attending: Radiology | Admitting: Radiology

## 2015-05-16 DIAGNOSIS — N7093 Salpingitis and oophoritis, unspecified: Secondary | ICD-10-CM | POA: Insufficient documentation

## 2015-05-16 DIAGNOSIS — M4316 Spondylolisthesis, lumbar region: Secondary | ICD-10-CM | POA: Insufficient documentation

## 2015-05-16 DIAGNOSIS — K7460 Unspecified cirrhosis of liver: Secondary | ICD-10-CM | POA: Insufficient documentation

## 2015-05-16 DIAGNOSIS — R109 Unspecified abdominal pain: Secondary | ICD-10-CM | POA: Insufficient documentation

## 2015-05-16 MED ORDER — IOHEXOL 300 MG/ML  SOLN
100.0000 mL | Freq: Once | INTRAMUSCULAR | Status: AC | PRN
  Administered 2015-05-16: 100 mL via INTRAVENOUS

## 2015-05-16 NOTE — Procedures (Signed)
Pelvic abscesses resolved by CT  Both drains were removed today  No comp

## 2015-05-28 ENCOUNTER — Ambulatory Visit: Payer: Self-pay | Attending: Internal Medicine

## 2015-05-30 ENCOUNTER — Encounter: Payer: Self-pay | Admitting: Internal Medicine

## 2015-05-30 ENCOUNTER — Ambulatory Visit: Payer: Self-pay | Attending: Internal Medicine | Admitting: Internal Medicine

## 2015-05-30 VITALS — BP 116/80 | HR 63 | Temp 98.0°F | Resp 16 | Ht 61.0 in | Wt 159.0 lb

## 2015-05-30 DIAGNOSIS — M47816 Spondylosis without myelopathy or radiculopathy, lumbar region: Secondary | ICD-10-CM

## 2015-05-30 DIAGNOSIS — B1920 Unspecified viral hepatitis C without hepatic coma: Secondary | ICD-10-CM | POA: Insufficient documentation

## 2015-05-30 DIAGNOSIS — K7460 Unspecified cirrhosis of liver: Secondary | ICD-10-CM

## 2015-05-30 DIAGNOSIS — Z880 Allergy status to penicillin: Secondary | ICD-10-CM | POA: Insufficient documentation

## 2015-05-30 DIAGNOSIS — I10 Essential (primary) hypertension: Secondary | ICD-10-CM

## 2015-05-30 DIAGNOSIS — Z79899 Other long term (current) drug therapy: Secondary | ICD-10-CM | POA: Insufficient documentation

## 2015-05-30 DIAGNOSIS — F1290 Cannabis use, unspecified, uncomplicated: Secondary | ICD-10-CM | POA: Insufficient documentation

## 2015-05-30 DIAGNOSIS — Z8619 Personal history of other infectious and parasitic diseases: Secondary | ICD-10-CM

## 2015-05-30 DIAGNOSIS — Z888 Allergy status to other drugs, medicaments and biological substances status: Secondary | ICD-10-CM | POA: Insufficient documentation

## 2015-05-30 LAB — COMPLETE METABOLIC PANEL WITH GFR
ALBUMIN: 4.4 g/dL (ref 3.6–5.1)
ALK PHOS: 50 U/L (ref 33–130)
ALT: 11 U/L (ref 6–29)
AST: 18 U/L (ref 10–35)
BILIRUBIN TOTAL: 0.3 mg/dL (ref 0.2–1.2)
BUN: 14 mg/dL (ref 7–25)
CO2: 29 mmol/L (ref 20–31)
Calcium: 9.6 mg/dL (ref 8.6–10.4)
Chloride: 99 mmol/L (ref 98–110)
Creat: 0.81 mg/dL (ref 0.50–1.05)
GFR, EST NON AFRICAN AMERICAN: 83 mL/min (ref >=60)
Glucose, Bld: 75 mg/dL (ref 65–99)
Potassium: 4.8 mmol/L (ref 3.5–5.3)
Sodium: 135 mmol/L (ref 135–146)
TOTAL PROTEIN: 7.8 g/dL (ref 6.1–8.1)

## 2015-05-30 MED ORDER — GABAPENTIN 800 MG PO TABS: 1200.0000 mg | ORAL_TABLET | Freq: Three times a day (TID) | ORAL | Status: DC

## 2015-05-30 MED ORDER — AMLODIPINE BESYLATE 10 MG PO TABS: 10.0000 mg | ORAL_TABLET | Freq: Every day | ORAL | Status: DC

## 2015-05-30 MED ORDER — NORTRIPTYLINE HCL 25 MG PO CAPS: 25.0000 mg | ORAL_CAPSULE | Freq: Every day | ORAL | Status: DC

## 2015-05-30 NOTE — Progress Notes (Signed)
Patient ID: Rachel Hudson, female   DOB: 10-Feb-1961, 55 y.o.   MRN: [id number]  CC: HTN follow up  HPI: Rachel Hudson is a 55 y.o. female here today for a follow up visit.  Patient has past medical history of Hepatitis C, hepatic cirrhosis, and HTN. Patient has not been evaluated here in over one year. Patient currently only takes Amlodipine, Gabapentin, and Nortriptyline. She is requesting to be retested for Hep C to make sure it is resolved. Review of EMR reveals that patient was due to see Gastroenterology but has been unable to do so due to lack of insurance. She is requesting refills of her medications today.  Patient has No headache, No chest pain, No abdominal pain - No Nausea, No new weakness tingling or numbness, No Cough - SOB.  Allergies  Allergen Reactions  . Penicillins     Facial swelling  . Tylenol [Acetaminophen] Other (See Comments)    HX of Hep. C   Past Medical History  Diagnosis Date  . Hypertension   . Hep C w/o coma, chronic (HCC) As of 10/22/13  . Cirrhosis of liver [facility]) September 2015    Stage 4   Current Outpatient Prescriptions on File Prior to Visit  Medication Sig Dispense Refill  . amLODipine (NORVASC) 10 MG tablet Take 10 mg by mouth daily.    Marland Kitchen gabapentin (NEURONTIN) 800 MG tablet Take 1,200 mg by mouth 3 (three) times daily.    . nortriptyline (PAMELOR) 25 MG capsule Take 25 mg by mouth at bedtime.    . Omega-3 Fatty Acids (FISH OIL PO) Take 1-2 capsules by mouth.    . polyethylene glycol (MIRALAX / GLYCOLAX) packet Take 17 g by mouth daily as needed for mild constipation. 14 each 0   No current facility-administered medications on file prior to visit.   Family History  Problem Relation Age of Onset  . Adopted: Yes  . Family history unknown: Yes   Social History   Social History  . Marital Status: Single    Spouse Name: N/A  . Number of Children: N/A  . Years of Education: N/A   Occupational History  . Not on file.   Social History  Main Topics  . Smoking status: Never Smoker   . Smokeless tobacco: Never Used  . Alcohol Use: No     Comment: 2 pints per week/stopped 01/10/14  . Drug Use: 7.00 per week    Special: Marijuana  . Sexual Activity: Yes    Birth Control/ Protection: None   Other Topics Concern  . Not on file   Social History Narrative    Review of Systems: Constitutional: Negative for fever, chills, diaphoresis, activity change, appetite change and fatigue. HENT: Negative for ear pain, nosebleeds, congestion, facial swelling, rhinorrhea, neck pain, neck stiffness and ear discharge.  Eyes: Negative for pain, discharge, redness, itching and visual disturbance. Respiratory: Negative for cough, choking, chest tightness, shortness of breath, wheezing and stridor.  Cardiovascular: Negative for chest pain, palpitations and leg swelling. Gastrointestinal: Negative for abdominal distention. Genitourinary: Negative for dysuria, urgency, frequency, hematuria, flank pain, decreased urine volume, difficulty urinating and dyspareunia.  Musculoskeletal: Negative for back pain, joint swelling, arthralgias and gait problem. Neurological: Negative for dizziness, tremors, seizures, syncope, facial asymmetry, speech difficulty, weakness, light-headedness, numbness and headaches.  Hematological: Negative for adenopathy. Does not bruise/bleed easily. Psychiatric/Behavioral: Negative for hallucinations, behavioral problems, confusion, dysphoric mood, decreased concentration and agitation.    Objective:   Filed Vitals:   05/30/15 1209  BP: 116/80  Pulse: 63  Temp: 98 F (36.7 C)  Resp: 16    Physical Exam: Constitutional: Patient appears well-developed and well-nourished. No distress. HENT: Normocephalic, atraumatic, External right and left ear normal. Oropharynx is clear and moist.  Eyes: Conjunctivae and EOM are normal. PERRLA, no scleral icterus. Neck: Normal ROM. Neck supple. No JVD. No tracheal deviation. No  thyromegaly. CVS: RRR, S1/S2 +, no murmurs, no gallops, no carotid bruit.  Pulmonary: Effort and breath sounds normal, no stridor, rhonchi, wheezes, rales.  Abdominal: Soft. BS +,  no distension, tenderness, rebound or guarding.  Musculoskeletal: Normal range of motion. No edema and no tenderness.  Lymphadenopathy: No lymphadenopathy noted, cervical, inguinal or axillary Neuro: Alert. Normal reflexes, muscle tone coordination. No cranial nerve deficit. Skin: Skin is warm and dry. No rash noted. Not diaphoretic. No erythema. No pallor. Psychiatric: Normal mood and affect. Behavior, judgment, thought content normal.  Lab Results  Component Value Date   WBC 9.2 04/30/2015   HGB 11.3* 04/30/2015   HCT 31.7* 04/30/2015   MCV 83.4 04/30/2015   PLT 223 04/30/2015   Lab Results  Component Value Date   CREATININE 0.76 05/02/2015   BUN 6 04/30/2015   NA 132* 04/30/2015   K 3.0* 04/30/2015   CL 95* 04/30/2015   CO2 26 04/30/2015    Lab Results  Component Value Date   HGBA1C 5.7* 09/28/2013   Lipid Panel     Component Value Date/Time   CHOL 177 09/28/2013 0904   TRIG 124 09/28/2013 0904   HDL 64 09/28/2013 0904   CHOLHDL 2.8 09/28/2013 0904   VLDL 25 09/28/2013 0904   LDLCALC 88 09/28/2013 0904       Assessment and plan:   Rachel Hudson was seen today for follow-up.  Diagnoses and all orders for this visit:  Cirrhosis of liver without ascites, unspecified hepatic cirrhosis type (HCC) -     Ambulatory referral to Gastroenterology -     COMPLETE METABOLIC PANEL WITH GFR  History of hepatitis C -     Hepatitis C RNA quantitative Will recheck viral load. Explained that she will always test positive for Hep C but it is the viral load that we look at to make sure it is not detected.   Essential hypertension -     Refill amLODipine (NORVASC) 10 MG tablet; Take 1 tablet (10 mg total) by mouth daily. Patient blood pressure is stable and may continue on current medication.  Education  on diet, exercise, and modifiable risk factors discussed. Will obtain appropriate labs as needed. Will follow up in 3-6 months.   Spondylosis of lumbar region without myelopathy or radiculopathy -     gabapentin (NEURONTIN) 800 MG tablet; Take 1.5 tablets (1,200 mg total) by mouth 3 (three) times daily. -     nortriptyline (PAMELOR) 25 MG capsule; Take 1 capsule (25 mg total) by mouth at bedtime. Meds refilled.   Return in about 6 months (around 11/27/2015) for Hypertension.       Ambrose FinlandValerie A Jachin Coury, NP-C [facility] [phone] 05/30/2015, 12:19 PM

## 2015-05-30 NOTE — Progress Notes (Signed)
Patient here for follow up on her HTN and hep C Patient also in need of medication refills

## 2015-06-02 ENCOUNTER — Encounter: Payer: Self-pay | Admitting: Obstetrics & Gynecology

## 2015-06-03 LAB — HEPATITIS C RNA QUANTITATIVE: HCV Quantitative: NOT DETECTED IU/mL (ref <15)

## 2015-06-05 ENCOUNTER — Encounter: Payer: Self-pay | Admitting: Internal Medicine

## 2015-06-10 ENCOUNTER — Telehealth: Payer: Self-pay

## 2015-06-10 NOTE — Telephone Encounter (Signed)
-----   Message from Ambrose Finland, NP sent at 06/10/2015 12:08 PM EST ----- Hep C viral load not detected which is great. Labs normal

## 2015-06-10 NOTE — Telephone Encounter (Signed)
Spoke with patient and she is aware of her normal labs and Hep C viral Not detected.  Patient was very excited

## 2015-06-11 ENCOUNTER — Encounter: Payer: Self-pay | Admitting: Obstetrics and Gynecology

## 2015-06-11 ENCOUNTER — Telehealth: Payer: Self-pay | Admitting: *Deleted

## 2015-06-11 ENCOUNTER — Ambulatory Visit (INDEPENDENT_AMBULATORY_CARE_PROVIDER_SITE_OTHER): Payer: Self-pay | Admitting: Obstetrics and Gynecology

## 2015-06-11 ENCOUNTER — Other Ambulatory Visit: Payer: Self-pay | Admitting: Obstetrics and Gynecology

## 2015-06-11 VITALS — BP 130/72 | HR 59 | Temp 98.2°F | Ht 61.0 in | Wt 156.0 lb

## 2015-06-11 DIAGNOSIS — N7093 Salpingitis and oophoritis, unspecified: Secondary | ICD-10-CM

## 2015-06-11 DIAGNOSIS — Z1231 Encounter for screening mammogram for malignant neoplasm of breast: Secondary | ICD-10-CM

## 2015-06-11 NOTE — Telephone Encounter (Signed)
Patient called stating that she was referred to a Gastroenterologist, However patient said she does not have transportation to go to the gastroenterologist and would like to know if she can be referred to one closer. Please follow up with patient.

## 2015-06-11 NOTE — Progress Notes (Signed)
Patient ID: Rachel Hudson, female   DOB: [birth year], 55 y.o.   MRN: [id number] 55 yo here for follow up on bilateral TOA. Patient was discharge on 05/04/2015 with a 10-day course of antibiotics. Her drains her removed by radiology on 05/16/2015. Patient reports felling well otherwise and is without complaints. She reports some occasional bilateral lower pelvic pain but nothing that she requires any pain medication for. She denies any abnormal vaginal bleeding. She has been sexually active without issues  Past Medical History  Diagnosis Date  . Hypertension   . Hep C w/o coma, chronic (HCC) As of 10/22/13  . Cirrhosis of liver [facility]) September 2015    Stage 4   Past Surgical History  Procedure Laterality Date  . Ligament repair Right 1980    Knee   Family History  Problem Relation Age of Onset  . Adopted: Yes  . Family history unknown: Yes   Social History  Substance Use Topics  . Smoking status: Never Smoker   . Smokeless tobacco: Never Used  . Alcohol Use: No     Comment: 2 pints per week/stopped 01/10/14   ROS See pertinent in HPI  Blood pressure 130/72, pulse 59, temperature 98.2 F (36.8 C), temperature source Oral, height  (1.549 m), weight 156 lb (70.761 kg), last menstrual period 05/26/2015. GENERAL: Well-developed, well-nourished female in no acute distress.  ABDOMEN: Soft, nontender, nondistended. No organomegaly. PELVIC: Normal external female genitalia. Vagina is pink and rugated.  Normal discharge. Normal appearing cervix. Uterus is normal in size. No adnexal mass or tenderness. EXTREMITIES: No cyanosis, clubbing, or edema, 2+ distal pulses.  A/P 55 yo here for TOA follow up - Patient is medically cleared to resume all activities - patient referred for screening mammogram. She has never had one - RTC prn

## 2015-06-16 MED FILL — GABAPENTIN 800 MG TABLET: 800 | 20 days supply | Qty: 90 | Fill #0

## 2015-06-20 ENCOUNTER — Ambulatory Visit
Admission: RE | Admit: 2015-06-20 | Discharge: 2015-06-20 | Disposition: A | Discharge status: Home / Self Care | Payer: No Typology Code available for payment source | Source: Ambulatory Visit | Attending: Obstetrics and Gynecology | Admitting: Obstetrics and Gynecology

## 2015-06-20 DIAGNOSIS — Z1231 Encounter for screening mammogram for malignant neoplasm of breast: Secondary | ICD-10-CM

## 2015-06-24 ENCOUNTER — Encounter: Payer: Self-pay | Admitting: Nurse Practitioner

## 2015-06-24 ENCOUNTER — Other Ambulatory Visit: Payer: Self-pay

## 2015-06-24 ENCOUNTER — Ambulatory Visit (INDEPENDENT_AMBULATORY_CARE_PROVIDER_SITE_OTHER): Payer: Self-pay | Admitting: Nurse Practitioner

## 2015-06-24 VITALS — BP 118/77 | HR 58 | Temp 97.4°F | Ht 61.0 in | Wt 155.6 lb

## 2015-06-24 DIAGNOSIS — Z1211 Encounter for screening for malignant neoplasm of colon: Secondary | ICD-10-CM

## 2015-06-24 DIAGNOSIS — B182 Chronic viral hepatitis C: Secondary | ICD-10-CM

## 2015-06-24 NOTE — Progress Notes (Signed)
Liver calculations based on labs drawn 04/30/15:  MELD: 9 Child-Pugh: Class A

## 2015-06-24 NOTE — Progress Notes (Signed)
  Primary Care Physician:  Valerie A Keck, NP Primary Gastroenterologist:  Dr. Rourk  Chief Complaint  Patient presents with  . cirrhosis    HPI:   54-year-old female presents for evaluation of cirrhosis. She has a history of hepatitis C which appears to been treated as her viral load was retested on 05/30/2015 and found to be not detected. Last CMP drawn 04/30/2015 with normal creatinine, low albumin at 2.8, normal AST/ALT, alk phosphatase, total bilirubin. Last INR drawn 04/30/2015 normal at 1.25. Last CBC drawn 04/30/2015 with mild anemia with a hemoglobin of 11.3. CT of the abdomen and pelvis completed 05/16/2015 for evaluation of percutaneous drain placement for bulky bilateral tubo-ovarian abscess. Noted chronic cirrhosis on this exam, no discrete liver lesion identified. No history of endoscopy or colonoscopy in our system.  Today she states she feels great overall. Denies persistent abdominal pain, N/V, fever, chills, unintentional weight loss. Denies yellowing of skin or eyes, LE edema, abdominal swelling, darkened urine, episodic confusion. Denies hematochezia and melena. Has never had colonoscopy before. Denies chest pain, dyspnea, dizziness, lightheadedness, syncope, near syncope. Denies any other upper or lower GI symptoms.  Past Medical History  Diagnosis Date  . Hypertension   . Hep C w/o coma, chronic (HCC) As of 10/22/13  . Cirrhosis of liver (HCC) September 2015    Stage 4    Past Surgical History  Procedure Laterality Date  . Ligament repair Right 1980    Knee    Current Outpatient Prescriptions  Medication Sig Dispense Refill  . amLODipine (NORVASC) 10 MG tablet Take 1 tablet (10 mg total) by mouth daily. 30 tablet 5  . gabapentin (NEURONTIN) 800 MG tablet Take 1.5 tablets (1,200 mg total) by mouth 3 (three) times daily. 90 tablet 2  . Multiple Vitamins-Minerals (MULTIVITAMIN WITH IRON-MINERALS) liquid Take by mouth daily.    . nortriptyline (PAMELOR) 25 MG  capsule Take 1 capsule (25 mg total) by mouth at bedtime. 30 capsule 2   No current facility-administered medications for this visit.    Allergies as of 06/24/2015 - Review Complete 06/24/2015  Allergen Reaction Noted  . Penicillins  03/28/2011  . Tylenol [acetaminophen] Other (See Comments) 07/14/2014    Family History  Problem Relation Age of Onset  . Adopted: Yes  . Family history unknown: Yes    Social History   Social History  . Marital Status: Single    Spouse Name: N/A  . Number of Children: N/A  . Years of Education: N/A   Occupational History  . Not on file.   Social History Main Topics  . Smoking status: Never Smoker   . Smokeless tobacco: Never Used     Comment: smokes Weed occasionally  . Alcohol Use: No     Comment: 2 pints per week/stopped 01/10/14  . Drug Use: 7.00 per week    Special: Marijuana  . Sexual Activity: Yes    Birth Control/ Protection: None   Other Topics Concern  . Not on file   Social History Narrative    Review of Systems: General: Negative for anorexia, weight loss, fever, chills, fatigue, weakness. Eyes: Negative for vision changes.  ENT: Negative for hoarseness, difficulty swallowing. CV: Negative for chest pain, angina, palpitations, peripheral edema.  Respiratory: Negative for dyspnea at rest, cough, sputum, wheezing.  GI: See history of present illness. Derm: Negative for rash or itching.  Endo: Negative for unusual weight change.  Heme: Negative for bruising or bleeding. Allergy: Negative for rash or hives.      Physical Exam: BP 118/77 mmHg  Pulse 58  Temp(Src) 97.4 F (36.3 C)  Ht 5' 1" (1.549 m)  Wt 155 lb 9.6 oz (70.58 kg)  BMI 29.42 kg/m2  LMP 06/24/2015 (Exact Date) General:   Alert and oriented. Pleasant and cooperative. Well-nourished and well-developed.  Head:  Normocephalic and atraumatic. Eyes:  Without icterus, sclera clear and conjunctiva pink.  Ears:  Normal auditory acuity. Cardiovascular:  S1,  S2 present without murmurs appreciated. Extremities without clubbing or edema. Respiratory:  Clear to auscultation bilaterally. No wheezes, rales, or rhonchi. No distress.  Gastrointestinal:  +BS, soft, non-tender and non-distended. Liver border noted 2 fingerbreadths below the right costal margin. No guarding or rebound. No masses appreciated.  Rectal:  Deferred  Musculoskalatal:  Symmetrical without gross deformities. Skin:  Intact without significant lesions or rashes. Neurologic:  Alert and oriented x4;  grossly normal neurologically. Psych:  Alert and cooperative. Normal mood and affect. Heme/Lymph/Immune: No excessive bruising noted.    06/24/2015 9:30 AM

## 2015-06-24 NOTE — Patient Instructions (Signed)
1. We will schedule your procedure for you. 2. Return for follow-up in 6 months for updated labs, imaging.

## 2015-06-24 NOTE — Assessment & Plan Note (Addendum)
55 year old female with no history of screening colonoscopy. At this point she is due. She is generally asymptomatic from a GI standpoint although she does have a hep C related liver cirrhosis status post hepatitis C treatment and eradication. At this point we will set her up for colonoscopy for first-ever screening in addition to her endoscopy as noted above.  Proceed with colonoscopy and endoscopy with Dr. Jena Gauss in the near future. The risks, benefits, and alternatives have been discussed in detail with the patient. They state understanding and desire to proceed.   The patient is not on any anticoagulants, anxiolytics, chronic pain medications, or antidepressants. She does have a history of chronic alcohol use however she has not had anything to drink in a year and a half. We'll plan for the procedure with 25 mg preprocedure Phenergan to promote adequate sedation.

## 2015-06-24 NOTE — Assessment & Plan Note (Signed)
55 year old female with hepatitis C cirrhosis. She is status post hep C treatment with documented eradication on RNA viral load completed end of last year. Her labs and imaging are up-to-date. She appears well compensated with a meld score of 9 and child Pugh class a based on labs drawn 04/30/2015. Her imaging is up-to-date as well with the CT completed in December 2016 which shows cirrhotic morphology of the liver without identified concerning lesion or mass. At this point because she is due for initial screening colonoscopy we'll also add on an endoscopy to evaluate for esophageal varices. We will bring her back in 6 months for routine care including updated labs and imaging.

## 2015-06-25 NOTE — Progress Notes (Signed)
CC'D TO PCP °

## 2015-07-08 ENCOUNTER — Encounter (HOSPITAL_COMMUNITY): Payer: Self-pay | Admitting: *Deleted

## 2015-07-08 ENCOUNTER — Encounter (HOSPITAL_COMMUNITY)
Admission: RE | Disposition: A | Discharge status: Home / Self Care | Payer: Self-pay | Source: Ambulatory Visit | Attending: Internal Medicine

## 2015-07-08 ENCOUNTER — Ambulatory Visit (HOSPITAL_COMMUNITY)
Admission: RE | Admit: 2015-07-08 | Discharge: 2015-07-08 | Disposition: A | Discharge status: Home / Self Care | Payer: No Typology Code available for payment source | Source: Ambulatory Visit | Attending: Internal Medicine | Admitting: Internal Medicine

## 2015-07-08 DIAGNOSIS — K648 Other hemorrhoids: Secondary | ICD-10-CM | POA: Insufficient documentation

## 2015-07-08 DIAGNOSIS — K449 Diaphragmatic hernia without obstruction or gangrene: Secondary | ICD-10-CM | POA: Insufficient documentation

## 2015-07-08 DIAGNOSIS — K279 Peptic ulcer, site unspecified, unspecified as acute or chronic, without hemorrhage or perforation: Secondary | ICD-10-CM | POA: Insufficient documentation

## 2015-07-08 DIAGNOSIS — Z79899 Other long term (current) drug therapy: Secondary | ICD-10-CM | POA: Insufficient documentation

## 2015-07-08 DIAGNOSIS — I8510 Secondary esophageal varices without bleeding: Secondary | ICD-10-CM | POA: Insufficient documentation

## 2015-07-08 DIAGNOSIS — K210 Gastro-esophageal reflux disease with esophagitis: Secondary | ICD-10-CM | POA: Insufficient documentation

## 2015-07-08 DIAGNOSIS — K319 Disease of stomach and duodenum, unspecified: Secondary | ICD-10-CM | POA: Insufficient documentation

## 2015-07-08 DIAGNOSIS — K5730 Diverticulosis of large intestine without perforation or abscess without bleeding: Secondary | ICD-10-CM | POA: Insufficient documentation

## 2015-07-08 DIAGNOSIS — B182 Chronic viral hepatitis C: Secondary | ICD-10-CM | POA: Insufficient documentation

## 2015-07-08 DIAGNOSIS — I10 Essential (primary) hypertension: Secondary | ICD-10-CM | POA: Insufficient documentation

## 2015-07-08 DIAGNOSIS — Z1211 Encounter for screening for malignant neoplasm of colon: Secondary | ICD-10-CM | POA: Insufficient documentation

## 2015-07-08 DIAGNOSIS — K2210 Ulcer of esophagus without bleeding: Secondary | ICD-10-CM | POA: Insufficient documentation

## 2015-07-08 DIAGNOSIS — K7460 Unspecified cirrhosis of liver: Secondary | ICD-10-CM | POA: Insufficient documentation

## 2015-07-08 HISTORY — PX: ESOPHAGOGASTRODUODENOSCOPY: SHX5428

## 2015-07-08 HISTORY — PX: COLONOSCOPY: SHX5424

## 2015-07-08 SURGERY — COLONOSCOPY
Anesthesia: Moderate Sedation

## 2015-07-08 MED ORDER — LIDOCAINE VISCOUS 2 % MT SOLN
OROMUCOSAL | Status: DC | PRN
  Administered 2015-07-08: 3 mL via OROMUCOSAL

## 2015-07-08 MED ORDER — MEPERIDINE HCL 100 MG/ML IJ SOLN
INTRAMUSCULAR | Status: AC
  Filled 2015-07-08: qty 2

## 2015-07-08 MED ORDER — ONDANSETRON HCL 4 MG/2ML IJ SOLN
INTRAMUSCULAR | Status: AC
  Filled 2015-07-08: qty 2

## 2015-07-08 MED ORDER — SODIUM CHLORIDE 0.9% FLUSH
INTRAVENOUS | Status: AC
  Filled 2015-07-08: qty 10

## 2015-07-08 MED ORDER — SIMETHICONE 40 MG/0.6ML PO SUSP
ORAL | Status: AC
  Filled 2015-07-08: qty 30

## 2015-07-08 MED ORDER — LIDOCAINE VISCOUS 2 % MT SOLN
OROMUCOSAL | Status: AC
  Filled 2015-07-08: qty 15

## 2015-07-08 MED ORDER — MIDAZOLAM HCL 5 MG/5ML IJ SOLN
INTRAMUSCULAR | Status: AC
  Filled 2015-07-08: qty 10

## 2015-07-08 MED ORDER — PROMETHAZINE HCL 25 MG/ML IJ SOLN
25.0000 mg | Freq: Once | INTRAMUSCULAR | Status: AC
  Administered 2015-07-08: 25 mg via INTRAVENOUS

## 2015-07-08 MED ORDER — SIMETHICONE 40 MG/0.6ML PO SUSP
ORAL | Status: DC | PRN
  Administered 2015-07-08: 15:00:00

## 2015-07-08 MED ORDER — MIDAZOLAM HCL 5 MG/5ML IJ SOLN
INTRAMUSCULAR | Status: DC | PRN
  Administered 2015-07-08 (×2): 2 mg via INTRAVENOUS
  Administered 2015-07-08 (×5): 1 mg via INTRAVENOUS

## 2015-07-08 MED ORDER — SODIUM CHLORIDE 0.9 % IV SOLN
INTRAVENOUS | Status: DC
  Administered 2015-07-08: 14:00:00 via INTRAVENOUS

## 2015-07-08 MED ORDER — PROMETHAZINE HCL 25 MG/ML IJ SOLN
INTRAMUSCULAR | Status: AC
  Filled 2015-07-08: qty 1

## 2015-07-08 MED ORDER — ONDANSETRON HCL 4 MG/2ML IJ SOLN
INTRAMUSCULAR | Status: DC | PRN
  Administered 2015-07-08: 4 mg via INTRAVENOUS

## 2015-07-08 MED ORDER — MEPERIDINE HCL 100 MG/ML IJ SOLN
INTRAMUSCULAR | Status: DC | PRN
  Administered 2015-07-08: 50 mg via INTRAVENOUS
  Administered 2015-07-08 (×4): 25 mg via INTRAVENOUS

## 2015-07-08 NOTE — H&P (View-Only) (Signed)
Primary Care Physician:  Lance Bosch, NP Primary Gastroenterologist:  Dr. Gala Romney  Chief Complaint  Patient presents with  . cirrhosis    HPI:   55 year old female presents for evaluation of cirrhosis. She has a history of hepatitis C which appears to been treated as her viral load was retested on 05/30/2015 and found to be not detected. Last CMP drawn 04/30/2015 with normal creatinine, low albumin at 2.8, normal AST/ALT, alk phosphatase, total bilirubin. Last INR drawn 04/30/2015 normal at 1.25. Last CBC drawn 04/30/2015 with mild anemia with a hemoglobin of 11.3. CT of the abdomen and pelvis completed 05/16/2015 for evaluation of percutaneous drain placement for bulky bilateral tubo-ovarian abscess. Noted chronic cirrhosis on this exam, no discrete liver lesion identified. No history of endoscopy or colonoscopy in our system.  Today she states she feels great overall. Denies persistent abdominal pain, N/V, fever, chills, unintentional weight loss. Denies yellowing of skin or eyes, LE edema, abdominal swelling, darkened urine, episodic confusion. Denies hematochezia and melena. Has never had colonoscopy before. Denies chest pain, dyspnea, dizziness, lightheadedness, syncope, near syncope. Denies any other upper or lower GI symptoms.  Past Medical History  Diagnosis Date  . Hypertension   . Hep C w/o coma, chronic ([place]) As of 10/22/13  . 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 Ligament repair Right 1980    Knee    Current Outpatient Prescriptions  Medication Sig Dispense Refill  . amLODipine (NORVASC) 10 MG tablet Take 1 tablet (10 mg total) by mouth daily. 30 tablet 5  . gabapentin (NEURONTIN) 800 MG tablet Take 1.5 tablets (1,200 mg total) by mouth 3 (three) times daily. 90 tablet 2  . Multiple Vitamins-Minerals (MULTIVITAMIN WITH IRON-MINERALS) liquid Take by mouth daily.    . nortriptyline (PAMELOR) 25 MG  capsule Take 1 capsule (25 mg total) by mouth at bedtime. 30 capsule 2   No current facility-administered medications for this visit.    Allergies as of 06/24/2015 - Review Complete 06/24/2015  Allergen Reaction Noted  . Penicillins  03/28/2011  . Tylenol [acetaminophen] Other (See Comments) 07/14/2014    Family History  Problem Relation Age of Onset  . Adopted: Yes  . Family history unknown: Yes    Social History   Social History  . Marital Status: Single    Spouse Name: N/A  . Number of Children: N/A  . Years of Education: N/A   Occupational History  . Not on file.   Social History Main Topics  . Smoking status: Never Smoker   . Smokeless tobacco: Never Used     Comment: smokes Weed occasionally  . Alcohol Use: No     Comment: 2 pints per week/stopped 01/10/14  . Drug Use: 7.00 per week    Special: Marijuana  . Sexual Activity: Yes    Birth Control/ Protection: None   Other Topics Concern  . Not on file   Social History Narrative    Review of Systems: General: Negative for anorexia, weight loss, fever, chills, fatigue, weakness. Eyes: Negative for vision changes.  ENT: Negative for hoarseness, difficulty swallowing. CV: Negative for chest pain, angina, palpitations, peripheral edema.  Respiratory: Negative for dyspnea at rest, cough, sputum, wheezing.  GI: See history of present illness. Derm: Negative for rash or itching.  Endo: Negative for unusual weight change.  Heme: Negative for bruising or bleeding. Allergy: Negative for rash or hives.  Physical Exam: BP 118/77 mmHg  Pulse 58  Temp(Src) 97.4 F (36.3 C)  Ht 5' 1" (1.549 m)  Wt 155 lb 9.6 oz (70.58 kg)  BMI 29.42 kg/m2  LMP 06/24/2015 (Exact Date) General:   Alert and oriented. Pleasant and cooperative. Well-nourished and well-developed.  Head:  Normocephalic and atraumatic. Eyes:  Without icterus, sclera clear and conjunctiva pink.  Ears:  Normal auditory acuity. Cardiovascular:  S1,  S2 present without murmurs appreciated. Extremities without clubbing or edema. Respiratory:  Clear to auscultation bilaterally. No wheezes, rales, or rhonchi. No distress.  Gastrointestinal:  +BS, soft, non-tender and non-distended. Liver border noted 2 fingerbreadths below the right costal margin. No guarding or rebound. No masses appreciated.  Rectal:  Deferred  Musculoskalatal:  Symmetrical without gross deformities. Skin:  Intact without significant lesions or rashes. Neurologic:  Alert and oriented x4;  grossly normal neurologically. Psych:  Alert and cooperative. Normal mood and affect. Heme/Lymph/Immune: No excessive bruising noted.    06/24/2015 9:30 AM

## 2015-07-08 NOTE — Op Note (Signed)
[facility] [address] [place], [address]   ENDOSCOPY PROCEDURE REPORT  PATIENT: [name], [name]  MR#: [id number] BIRTHDATE: 08-Apr-1961 , 54  yrs. old GENDER: female ENDOSCOPIST: R.  Roetta Sessions, MD FACP [facility] REFERRED BY:  Abbey Chatters PROCEDURE DATE:  2015-07-13 PROCEDURE:  EGD w/ biopsy INDICATIONS:  Screening for esophageal varices; cirrhosis. MEDICATIONS: Versed 6 mg IV and Demerol 125 mg IV in divided doses. Zofran 4 mg IV.  Phenergan 25 mg IV.  Xylocaine gel orally ASA CLASS:      Class III  CONSENT: The risks, benefits, limitations, alternatives and imponderables have been discussed.  The potential for biopsy, esophogeal dilation, etc. have also been reviewed.  Questions have been answered.  All parties agreeable.  Please see the history and physical in the medical record for more information.  DESCRIPTION OF PROCEDURE: After the risks benefits and alternatives of the procedure were thoroughly explained, informed consent was obtained.  The EG-[id number] ([id number]) endoscope was introduced through the mouth and advanced to the second portion of the duodenum , limited by Without limitations. The instrument was slowly withdrawn as the mucosa was fully examined. Estimated blood loss is zero unless otherwise noted in this procedure report.    No esophageal varices.  Circumferential distal esophageal erosions within 5 mm the GE junction.  No Barrett's epithelium.  Stomach empty.  2 cm hiatal hernia.  Scattered gastric erosions with a 1 cm elliptical prepyloric antral ulcer.  No sign of portal gastropathy or gastric varices.  Patent pylorus.  Normal first and second portion of the duodenum  Biopsies of the gastric ulcer taken for histologic study. Retroflexed views revealed a hiatal hernia.     The scope was then withdrawn from the patient and the procedure completed.  COMPLICATIONS: There were no immediate complications. EBL 3 mL ENDOSCOPIC  IMPRESSION: Erosive reflux esophagitis. Hiatal hernia. Gastric erosions and ulcer?"status post biopsy . No evidence of esophageal varices or other stigmata of portal hypertension  RECOMMENDATIONS: Begin Protonix 40 mg twice daily. Follow-up on pathology.  REPEAT EXAM:  eSigned:  R. Roetta Sessions, MD Jerrel Ivory [facility] Jul 13, 2015 3:09 PM    CC:  CPT CODES: ICD CODES:  The ICD and CPT codes recommended by this software are interpretations from the data that the clinical staff has captured with the software.  The verification of the translation of this report to the ICD and CPT codes and modifiers is the sole responsibility of the health care institution and practicing physician where this report was generated.  [employer] will not be held responsible for the validity of the ICD and CPT codes included on this report.  AMA assumes no liability for data contained or not contained herein. CPT is a Publishing rights manager of the [employer].  PATIENT NAME:  Amos, Rachel Hudson MR#: [id number]

## 2015-07-08 NOTE — Interval H&P Note (Signed)
History and Physical Interval Note:  07/08/2015 2:34 PM  Rachel Hudson  has presented today for surgery, with the diagnosis of SCREENING  The various methods of treatment have been discussed with the patient and family. After consideration of risks, benefits and other options for treatment, the patient has consented to  Procedure(s) with comments: COLONOSCOPY (N/A) - 230 ESOPHAGOGASTRODUODENOSCOPY (EGD) (N/A) as a surgical intervention .  The patient's history has been reviewed, patient examined, no change in status, stable for surgery.  I have reviewed the patient's chart and labs.  Questions were answered to the patient's satisfaction.     Eion Timbrook  No change. Screening EGD and colonoscopy per plan.  The risks, benefits, limitations, imponderables and alternatives regarding both EGD and colonoscopy have been reviewed with the patient. Questions have been answered. All parties agreeable.

## 2015-07-08 NOTE — Op Note (Signed)
[facility] [address] [place], [address]   COLONOSCOPY PROCEDURE REPORT  PATIENT: Rachel Hudson, Rachel Hudson  MR#: [id number] BIRTHDATE: [birth year] , 54  yrs. old GENDER: female ENDOSCOPIST: R.  Roetta Sessions, MD FACP [facility] REFERRED [id number] Luna Glasgow, NP PROCEDURE DATE:  Aug 05, 2015 PROCEDURE:   Screening colonoscopy INDICATIONS:First-ever average risk colorectal cancer screening examination. MEDICATIONS: Versed 9 mg IV and Demerol 150 mg IV in divided doses. Phenergan 25 mg IV.  Zofran 4 mg IV. ASA CLASS:       Class III  CONSENT: The risks, benefits, alternatives and imponderables including but not limited to bleeding, perforation as well as the possibility of a missed lesion have been reviewed.  The potential for biopsy, lesion removal, etc. have also been discussed. Questions have been answered.  All parties agreeable.  Please see the history and physical in the medical record for more information.  DESCRIPTION OF PROCEDURE:   After the risks benefits and alternatives of the procedure were thoroughly explained, informed consent was obtained.  The digital rectal exam revealed no abnormalities of the rectum.   The EG-[id number] ([id number])  endoscope was introduced through the anus and advanced to the cecum, which was identified by both the appendix and ileocecal valve. No adverse events experienced.   The quality of the prep was adequate  The instrument was then slowly withdrawn as the colon was fully examined. Estimated blood loss is zero unless otherwise noted in this procedure report.      COLON FINDINGS: Aside from internal hemorrhoids, rectal mucosa appeared normal.  Scattered left-sided transverse diverticula; the remainder of the colonic mucosa appeared normal.  Retroflexion was performed. .  Withdrawal time=8 minutes 0 seconds.  The scope was withdrawn and the procedure completed. COMPLICATIONS: There were no immediate complications.  ENDOSCOPIC  IMPRESSION: internal hemorrhoids. Colonic diverticulosis.  RECOMMENDATIONS: Repeat screening colonoscopy in 10 years. See EGD report.  eSigned:  R. Roetta Sessions, MD Jerrel Ivory [facility] 08/05/15 3:32 PM   cc:  CPT CODES: ICD CODES:  The ICD and CPT codes recommended by this software are interpretations from the data that the clinical staff has captured with the software.  The verification of the translation of this report to the ICD and CPT codes and modifiers is the sole responsibility of the health care institution and practicing physician where this report was generated.  [employer] will not be held responsible for the validity of the ICD and CPT codes included on this report.  AMA assumes no liability for data contained or not contained herein. CPT is a Publishing rights manager of the [employer].

## 2015-07-08 NOTE — Discharge Instructions (Signed)
Colonoscopy Discharge Instructions  Read the instructions outlined below and refer to this sheet in the next few weeks. These discharge instructions provide you with general information on caring for yourself after you leave the hospital. Your doctor may also give you specific instructions. While your treatment has been planned according to the most current medical practices available, unavoidable complications occasionally occur. If you have any problems or questions after discharge, call Dr. Gala Romney at [phone]. ACTIVITY  You may resume your regular activity, but move at a slower pace for the next 24 hours.   Take frequent rest periods for the next 24 hours.   Walking will help get rid of the air and reduce the bloated feeling in your belly (abdomen).   No driving for 24 hours (because of the medicine (anesthesia) used during the test).    Do not sign any important legal documents or operate any machinery for 24 hours (because of the anesthesia used during the test).  NUTRITION  Drink plenty of fluids.   You may resume your normal diet as instructed by your doctor.   Begin with a light meal and progress to your normal diet. Heavy or fried foods are harder to digest and may make you feel sick to your stomach (nauseated).   Avoid alcoholic beverages for 24 hours or as instructed.  MEDICATIONS  You may resume your normal medications unless your doctor tells you otherwise.  WHAT YOU CAN EXPECT TODAY  Some feelings of bloating in the abdomen.   Passage of more gas than usual.   Spotting of blood in your stool or on the toilet paper.  IF YOU HAD POLYPS REMOVED DURING THE COLONOSCOPY:  No aspirin products for 7 days or as instructed.   No alcohol for 7 days or as instructed.   Eat a soft diet for the next 24 hours.  FINDING OUT THE RESULTS OF YOUR TEST Not all test results are available during your visit. If your test results are not back during the visit, make an appointment  with your caregiver to find out the results. Do not assume everything is normal if you have not heard from your caregiver or the medical facility. It is important for you to follow up on all of your test results.  SEEK IMMEDIATE MEDICAL ATTENTION IF:  You have more than a spotting of blood in your stool.   Your belly is swollen (abdominal distention).   You are nauseated or vomiting.   You have a temperature over 101.   You have abdominal pain or discomfort that is severe or gets worse throughout the day.    Colonoscopy Discharge Instructions  Read the instructions outlined below and refer to this sheet in the next few weeks. These discharge instructions provide you with general information on caring for yourself after you leave the hospital. Your doctor may also give you specific instructions. While your treatment has been planned according to the most current medical practices available, unavoidable complications occasionally occur. If you have any problems or questions after discharge, call Dr. Gala Romney at [phone]. ACTIVITY  You may resume your regular activity, but move at a slower pace for the next 24 hours.   Take frequent rest periods for the next 24 hours.   Walking will help get rid of the air and reduce the bloated feeling in your belly (abdomen).   No driving for 24 hours (because of the medicine (anesthesia) used during the test).    Do not sign any important legal  documents or operate any machinery for 24 hours (because of the anesthesia used during the test).  NUTRITION  Drink plenty of fluids.   You may resume your normal diet as instructed by your doctor.   Begin with a light meal and progress to your normal diet. Heavy or fried foods are harder to digest and may make you feel sick to your stomach (nauseated).   Avoid alcoholic beverages for 24 hours or as instructed.  MEDICATIONS  You may resume your normal medications unless your doctor tells you otherwise.    WHAT YOU CAN EXPECT TODAY  Some feelings of bloating in the abdomen.   Passage of more gas than usual.   Spotting of blood in your stool or on the toilet paper.  IF YOU HAD POLYPS REMOVED DURING THE COLONOSCOPY:  No aspirin products for 7 days or as instructed.   No alcohol for 7 days or as instructed.   Eat a soft diet for the next 24 hours.  FINDING OUT THE RESULTS OF YOUR TEST Not all test results are available during your visit. If your test results are not back during the visit, make an appointment with your caregiver to find out the results. Do not assume everything is normal if you have not heard from your caregiver or the medical facility. It is important for you to follow up on all of your test results.  SEEK IMMEDIATE MEDICAL ATTENTION IF:  You have more than a spotting of blood in your stool.   Your belly is swollen (abdominal distention).   You are nauseated or vomiting.   You have a temperature over 101.   You have abdominal pain or discomfort that is severe or gets worse throughout the day.  EGD Discharge instructions Please read the instructions outlined below and refer to this sheet in the next few weeks. These discharge instructions provide you with general information on caring for yourself after you leave the hospital. Your doctor may also give you specific instructions. While your treatment has been planned according to the most current medical practices available, unavoidable complications occasionally occur. If you have any problems or questions after discharge, please call your doctor. ACTIVITY You may resume your regular activity but move at a slower pace for the next 24 hours.  Take frequent rest periods for the next 24 hours.  Walking will help expel (get rid of) the air and reduce the bloated feeling in your abdomen.  No driving for 24 hours (because of the anesthesia (medicine) used during the test).  You may shower.  Do not sign any important legal  documents or operate any machinery for 24 hours (because of the anesthesia used during the test).  NUTRITION Drink plenty of fluids.  You may resume your normal diet.  Begin with a light meal and progress to your normal diet.  Avoid alcoholic beverages for 24 hours or as instructed by your caregiver.  MEDICATIONS You may resume your normal medications unless your caregiver tells you otherwise.  WHAT YOU CAN EXPECT TODAY You may experience abdominal discomfort such as a feeling of fullness or gas pains.  FOLLOW-UP Your doctor will discuss the results of your test with you.  SEEK IMMEDIATE MEDICAL ATTENTION IF ANY OF THE FOLLOWING OCCUR: Excessive nausea (feeling sick to your stomach) and/or vomiting.  Severe abdominal pain and distention (swelling).  Trouble swallowing.  Temperature over 101 F (37.8 C).  Rectal bleeding or vomiting of blood.  Peptic Ulcer A peptic ulcer is a  painful sore in the lining of your esophagus, stomach, or in the first part of your small intestine. The main causes of an ulcer can be:  An infection.  Using certain pain medicines too often or too much.  Smoking. HOME CARE  Avoid smoking, alcohol, and caffeine.  Avoid foods that bother you.  Only take medicine as told by your doctor. Do not take any medicines your doctor has not approved.  Keep all doctor visits as told. GET HELP IF:  You do not get better in 7 days after starting treatment.  You keep having an upset stomach (indigestion) or heartburn. GET HELP RIGHT AWAY IF:  You have sudden, sharp, or lasting belly (abdominal) pain.  You have bloody, black, or tarry poop (stool).  You throw up (vomit) blood or your throw up looks like coffee grounds.  You get light-headed, weak, or feel like you will pass out (faint).  You get sweaty or feel sticky and cold to the touch (clammy). MAKE SURE YOU:  Understand these instructions.Diverticulosis Diverticulosis is the condition that develops  when small pouches (diverticula) form in the wall of your colon. Your colon, or large intestine, is where water is absorbed and stool is formed. The pouches form when the inside layer of your colon pushes through weak spots in the outer layers of your colon. CAUSES  No one knows exactly what causes diverticulosis. RISK FACTORS  Being older than 50. Your risk for this condition increases with age. Diverticulosis is rare in people younger than 40 years. By age 47, almost everyone has it.  Eating a low-fiber diet.  Being frequently constipated.  Being overweight.  Not getting enough exercise.  Smoking.  Taking over-the-counter pain medicines, like aspirin and ibuprofen. SYMPTOMS  Most people with diverticulosis do not have symptoms. DIAGNOSIS  Because diverticulosis often has no symptoms, health care providers often discover the condition during an exam for other colon problems. In many cases, a health care provider will diagnose diverticulosis while using a flexible scope to examine the colon (colonoscopy). TREATMENT  If you have never developed an infection related to diverticulosis, you may not need treatment. If you have had an infection before, treatment may include:  Eating more fruits, vegetables, and grains.  Taking a fiber supplement.  Taking a live bacteria supplement (probiotic).  Taking medicine to relax your colon. HOME CARE INSTRUCTIONS   Drink at least 6-8 glasses of water each day to prevent constipation.  Try not to strain when you have a bowel movement.  Keep all follow-up appointments. If you have had an infection before:  Increase the fiber in your diet as directed by your health care provider or dietitian.  Take a dietary fiber supplement if your health care provider approves.  Only take medicines as directed by your health care provider. SEEK MEDICAL CARE IF:   You have abdominal pain.  You have bloating.  You have cramps.  You have not gone  to the bathroom in 3 days. SEEK IMMEDIATE MEDICAL CARE IF:   Your pain gets worse.  Yourbloating becomes very bad.  You have a fever or chills, and your symptoms suddenly get worse.  You begin vomiting.  You have bowel movements that are bloody or black. MAKE SURE YOU:  Understand these instructions.  Will watch your condition.  Will get help right away if you are not doing well or get worse.   This information is not intended to replace advice given to you by your health care provider.  Make sure you discuss any questions you have with your health care provider.   Document Released: 02/05/2004 Document Revised: 05/15/2013 Document Reviewed: 04/04/2013 Elsevier Interactive Patient Education [employer]    Will watch your condition.  Will get help right away if you are not doing well or get worse.   This information is not intended to replace advice given to you by your health care provider. Make sure you discuss any questions you have with your health care provider.   Document Released: 08/04/2009 Document Revised: 05/31/2014 Document Reviewed: 12/08/2011 Elsevier Interactive Patient Education 2016 Else[employer] Inc[employer] Gastroesophageal Reflux Disease, Adult Normally, food travels down the esophagus and stays in the stomach to be digested. However, when a person has gastroesophageal reflux disease (GERD), food and stomach acid move back up into the esophagus. When this happens, the esophagus becomes sore and inflamed. Over time, GERD can create small holes (ulcers) in the lining of the esophagus.  CAUSES This condition is caused by a problem with the muscle between the esophagus and the stomach (lower esophageal sphincter, or LES). Normally, the LES muscle closes after food passes through the esophagus to the stomach. When the LES is weakened or abnormal, it does not close properly, and that allows food and stomach acid to go back up into the esophagus. The LES can be weakened  by certain dietary substances, medicines, and medical conditions, including:  Tobacco use.  Pregnancy.  Having a hiatal hernia.  Heavy alcohol use.  Certain foods and beverages, such as coffee, chocolate, onions, and peppermint. RISK FACTORS This condition is more likely to develop in:  People who have an increased body weight.  People who have connective tissue disorders.  People who use NSAID medicines. SYMPTOMS Symptoms of this condition include:  Heartburn.  Difficult or painful swallowing.  The feeling of having a lump in the throat.  Abitter taste in the mouth.  Bad breath.  Having a large amount of saliva.  Having an upset or bloated stomach.  Belching.  Chest pain.  Shortness of breath or wheezing.  Ongoing (chronic) cough or a night-time cough.  Wearing away of tooth enamel.  Weight loss. Different conditions can cause chest pain. Make sure to see your health care provider if you experience chest pain. DIAGNOSIS Your health care provider will take a medical history and perform a physical exam. To determine if you have mild or severe GERD, your health care provider may also monitor how you respond to treatment. You may also have other tests, including:  An endoscopy toexamine your stomach and esophagus with a small camera.  A test thatmeasures the acidity level in your esophagus.  A test thatmeasures how much pressure is on your esophagus.  A barium swallow or modified barium swallow to show the shape, size, and functioning of your esophagus. TREATMENT The goal of treatment is to help relieve your symptoms and to prevent complications. Treatment for this condition may vary depending on how severe your symptoms are. Your health care provider may recommend:  Changes to your diet.  Medicine.  Surgery. HOME CARE INSTRUCTIONS Diet  Follow a diet as recommended by your health care provider. This may involve avoiding foods and drinks such  as:  Coffee and tea (with or without caffeine).  Drinks that containalcohol.  Energy drinks and sports drinks.  Carbonated drinks or sodas.  Chocolate and cocoa.  Peppermint and mint flavorings.  Garlic and onions.  Horseradish.  Spicy and acidic foods, including peppers, chili powder, curry  powder, vinegar, hot sauces, and barbecue sauce.  Citrus fruit juices and citrus fruits, such as oranges, lemons, and limes.  Tomato-based foods, such as red sauce, chili, salsa, and pizza with red sauce.  Fried and fatty foods, such as donuts, french fries, potato chips, and high-fat dressings.  High-fat meats, such as hot dogs and fatty cuts of red and white meats, such as rib eye steak, sausage, ham, and bacon.  High-fat dairy items, such as whole milk, butter, and cream cheese.  Eat small, frequent meals instead of large meals.  Avoid drinking large amounts of liquid with your meals.  Avoid eating meals during the 2-3 hours before bedtime.  Avoid lying down right after you eat.  Do not exercise right after you eat. General Instructions  Pay attention to any changes in your symptoms.  Take over-the-counter and prescription medicines only as told by your health care provider. Do not take aspirin, ibuprofen, or other NSAIDs unless your health care provider told you to do so.  Do not use any tobacco products, including cigarettes, chewing tobacco, and e-cigarettes. If you need help quitting, ask your health care provider.  Wear loose-fitting clothing. Do not wear anything tight around your waist that causes pressure on your abdomen.  Raise (elevate) the head of your bed 6 inches (15cm).  Try to reduce your stress, such as with yoga or meditation. If you need help reducing stress, ask your health care provider.  If you are overweight, reduce your weight to an amount that is healthy for you. Ask your health care provider for guidance about a safe weight loss goal.  Keep all  follow-up visits as told by your health care provider. This is important. SEEK MEDICAL CARE IF:  You have new symptoms.  You have unexplained weight loss.  You have difficulty swallowing, or it hurts to swallow.  You have wheezing or a persistent cough.  Your symptoms do not improve with treatment.  You have a hoarse voice. SEEK IMMEDIATE MEDICAL CARE IF:  You have pain in your arms, neck, jaw, teeth, or back.  You feel sweaty, dizzy, or light-headed.  You have chest pain or shortness of breath.  You vomit and your vomit looks like blood or coffee grounds.  You faint.  Your stool is bloody or black.  You cannot swallow, drink, or eat.   This information is not intended to replace advice given to you by your health care provider. Make sure you discuss any questions you have with your health care provider.   Document Released: 02/17/2005 Document Revised: 01/29/2015 Document Reviewed: 09/04/2014 Elsevier Interactive Patient Education 2016 [employer].    Information on GERD, peptic ulcer disease and diverticulosis provided  Begin Protonix 40 mg twice daily  Avoid nonsteroidal medications like aspirin and Aleve, Advil as much as possible  Further recommendations to follow pending review of pathology report

## 2015-07-10 ENCOUNTER — Encounter: Payer: Self-pay | Admitting: Internal Medicine

## 2015-07-10 ENCOUNTER — Encounter (HOSPITAL_COMMUNITY): Payer: Self-pay | Admitting: Internal Medicine

## 2015-07-10 ENCOUNTER — Telehealth: Payer: Self-pay | Admitting: General Practice

## 2015-07-10 NOTE — Telephone Encounter (Signed)
Pt is aware. Letter has been mailed to her.  Please cc pcp Please nic egd and pt needs ov in 3 months

## 2015-07-10 NOTE — Telephone Encounter (Signed)
Patient called in stating she had a tcs/egd done on 2/14 and that Dr.Rourk spoke with her husband in regards to his findings and he mentioned something about her liver, however her husband can't remember what he said.  She went on to say that she know she has cirrhosis, however she wanted to make sure there was nothing new that was found.  I explained to the patient that her results were not back from her procedure and we will contact when they are available.  She voiced understanding and disconnected the call.  Routing to Dr. Jena Gauss to see if he had something to add

## 2015-07-10 NOTE — Telephone Encounter (Signed)
REMINDER IN EPIC AND OV MADE ° °

## 2015-07-10 NOTE — Telephone Encounter (Signed)
See letter.

## 2015-07-10 NOTE — Telephone Encounter (Signed)
Per RMR- Send letter to patient.  Send copy of letter with path to referring provider and PCP.   Patient still has cirrhosis but did not see any esophageal varices which is good news. I recommend a repeat EGD for screening in 2 years. She should return to the office in 3 months if not already scheduled for cirrhosis care. Biopsies of ulcer negative. This was not much of an ulcer. I think we can hold off on a repeat EGD for 2 years unless new symptoms occur

## 2015-07-15 MED FILL — GABAPENTIN 800 MG TABLET: 800 | 20 days supply | Qty: 90 | Fill #1

## 2015-07-22 ENCOUNTER — Encounter: Payer: Self-pay | Admitting: Clinical

## 2015-07-22 NOTE — Progress Notes (Signed)
Depression screen [facility] 2/9 05/30/2015 01/20/2015 11/18/2014 11/06/2014 08/06/2014  Decreased Interest 0  Down, Depressed, Hopeless 2 0 0 0 0  PHQ - 2 Score 0  Altered sleeping 2 - 2 - -  Tired, decreased energy 2 - 3 - -  Change in appetite 3 - 3 - -  Feeling bad or failure about yourself  0 - 0 - -  Trouble concentrating 3 - 3 - -  Moving slowly or fidgety/restless 3 - 3 - -  Suicidal thoughts 0 - 0 - -  PHQ-9 Score 17 - 17 - -    GAD 7 : Generalized Anxiety Score 05/30/2015  Nervous, Anxious, on Edge 2  Control/stop worrying 0  Worry too much - different things 0  Trouble relaxing 2  Restless 2  Easily annoyed or irritable 2  Afraid - awful might happen 0  Total GAD 7 Score 8

## 2015-08-01 MED FILL — AMLODIPINE BESYLATE 10 MG T: 10 | 30 days supply | Qty: 30 | Fill #0

## 2015-08-07 ENCOUNTER — Encounter: Payer: Self-pay | Admitting: Physical Medicine & Rehabilitation

## 2015-08-07 ENCOUNTER — Ambulatory Visit (HOSPITAL_BASED_OUTPATIENT_CLINIC_OR_DEPARTMENT_OTHER): Payer: Self-pay | Admitting: Physical Medicine & Rehabilitation

## 2015-08-07 ENCOUNTER — Encounter: Payer: Self-pay | Attending: Physical Medicine & Rehabilitation

## 2015-08-07 VITALS — BP 139/75 | HR 63 | Resp 14

## 2015-08-07 DIAGNOSIS — G5601 Carpal tunnel syndrome, right upper limb: Secondary | ICD-10-CM | POA: Insufficient documentation

## 2015-08-07 DIAGNOSIS — G5602 Carpal tunnel syndrome, left upper limb: Secondary | ICD-10-CM | POA: Insufficient documentation

## 2015-08-07 DIAGNOSIS — M5441 Lumbago with sciatica, right side: Secondary | ICD-10-CM | POA: Insufficient documentation

## 2015-08-07 DIAGNOSIS — M5442 Lumbago with sciatica, left side: Secondary | ICD-10-CM | POA: Insufficient documentation

## 2015-08-07 DIAGNOSIS — G5603 Carpal tunnel syndrome, bilateral upper limbs: Secondary | ICD-10-CM

## 2015-08-07 NOTE — Progress Notes (Signed)
55 year old female with bilateral hand pain and numbness in the fingers pain in the wrist.  Tried oral steroids without much relief.  Procedure: Bilateral carpal tunnel injection  Informed consent was obtained after scrubbing risk and benefits of procedure the patient is include bleeding bruising infection she likes proceed and has given written consent. Patient placed in seated position area just medial to the flexor carpi radialis tendon was marked and prepped with Betadine entered with a 30-gauge 1/2 inch needle 0.25 mL lidocaine 1% injected into each side then a separate needle 30-gauge 1/2 inch inserted and 0.25 mL of Celestone 6 mg per mL injected. Patient tolerated procedure well. Post procedure instructions given    Patient had very good results from lumbar injections but now feels like they are wearing off. We'll schedule in 2 weeks Bilateral L3 L4 L5 medial branch blocks  I do estimate that she'll need repeat injection of her carpal tunnels in about 3 months

## 2015-08-07 NOTE — Patient Instructions (Signed)
The carpal tunnel injections can be repeated in 3 months   We will repeat the back injections in about 2 weeks

## 2015-08-11 ENCOUNTER — Ambulatory Visit: Payer: Self-pay | Attending: Internal Medicine

## 2015-08-11 MED FILL — GABAPENTIN 800 MG TABLET: 800 | 20 days supply | Qty: 90 | Fill #2

## 2015-08-29 ENCOUNTER — Encounter: Payer: Self-pay | Attending: Physical Medicine & Rehabilitation

## 2015-08-29 ENCOUNTER — Ambulatory Visit (HOSPITAL_BASED_OUTPATIENT_CLINIC_OR_DEPARTMENT_OTHER): Payer: Self-pay | Admitting: Physical Medicine & Rehabilitation

## 2015-08-29 ENCOUNTER — Encounter: Payer: Self-pay | Admitting: Physical Medicine & Rehabilitation

## 2015-08-29 VITALS — BP 130/70 | HR 78 | Resp 14

## 2015-08-29 DIAGNOSIS — M5441 Lumbago with sciatica, right side: Secondary | ICD-10-CM | POA: Insufficient documentation

## 2015-08-29 DIAGNOSIS — G5603 Carpal tunnel syndrome, bilateral upper limbs: Secondary | ICD-10-CM

## 2015-08-29 DIAGNOSIS — G5602 Carpal tunnel syndrome, left upper limb: Secondary | ICD-10-CM | POA: Insufficient documentation

## 2015-08-29 DIAGNOSIS — M47816 Spondylosis without myelopathy or radiculopathy, lumbar region: Secondary | ICD-10-CM

## 2015-08-29 DIAGNOSIS — G5601 Carpal tunnel syndrome, right upper limb: Secondary | ICD-10-CM | POA: Insufficient documentation

## 2015-08-29 DIAGNOSIS — M5442 Lumbago with sciatica, left side: Secondary | ICD-10-CM | POA: Insufficient documentation

## 2015-08-29 NOTE — Progress Notes (Signed)
  PROCEDURE RECORD [facility]   Name: Rachel CottaFrances M Hudson DOB:07-23-1960 MRN: [id number]  Date:08/29/2015  Physician: Claudette LawsAndrew Kirsteins, MD    Nurse/CMA:Shumaker RN  Allergies:  Allergies  Allergen Reactions  . Penicillins     Facial swelling Has patient had a PCN reaction causing immediate rash, facial/tongue/throat swelling, SOB or lightheadedness with hypotension: Yes Has patient had a PCN reaction causing severe rash involving mucus membranes or skin necrosis: No Has patient had a PCN reaction that required hospitalization No Has patient had a PCN reaction occurring within the last 10 years: Yes If all of the above answers are "NO", then may proceed with Cephalosporin use.   . Tylenol [Acetaminophen] Other (See Comments)    HX of Hep. C    Consent Signed: Yes.    Is patient diabetic? No.  CBG today?   Pregnant: No. LMP: Patient's last menstrual period was 08/07/2015. (age 55-55)  Anticoagulants: no Anti-inflammatory: no Antibiotics: no  Procedure:Bilateral L 3-4-5 Medial Branch Block   Position: Prone Start Time: 933 End Time: 942  Fluoro Time: 34 sec  RN/CMA Haematologisthumaker RN Shumaker RN    Time 922 946    BP 130/70 140/75    Pulse 78 70    Respirations 14 14    O2 Sat 98 99    S/S 6 6    Pain Level 9/10 9/10     [facility], patient A & O X 3, D/C instructions reviewed, and sits independently.

## 2015-08-29 NOTE — Progress Notes (Signed)
Bilateral Lumbar L3, L4  medial branch blocks and L 5 dorsal ramus injection under fluoroscopic guidance, Evidence of lumbar spondylosis as well as mild spondylolisthesis L4-L5 and L5-S1. Toe pain on the right improved after right L4-5 epidural injection primary complaint today is low back pain.    Indication: Lumbar pain which is not relieved by medication management or other conservative care and interfering with self-care and mobility.  Informed consent was obtained after describing risks and benefits of the procedure with the patient, this includes bleeding, infection, paralysis and medication side effects.  The patient wishes to proceed and has given written consent.  The patient was placed in prone position.  The lumbar area was marked and prepped with Betadine.  One mL of 1% lidocaine was injected into each of 6 areas into the skin and subcutaneous tissue.  Then a 22-gauge 3.5 inch spinal needle was inserted targeting the junction of the left S1 superior articular process and sacral ala junction. Needle was advanced under fluoroscopic guidance.  Bone contact was made.  Omnipaque 180 was injected x 0.5 mL demonstrating no intravascular uptake.  Then a solution containing one mL of 4 mg per mL dexamethasone and 3 mL of 2% MPF lidocaine was injected x 0.5 mL.  Then the left L5 superior articular process in transverse process junction was targeted.  Bone contact was made.  Omnipaque 180 was injected x 0.5 mL demonstrating no intravascular uptake. Then a solution containing one mL of 4 mg per mL dexamethasone and 3 mL of 2% MPF lidocaine was injected x 0.5 mL.  Then the left L4 superior articular process in transverse process junction was targeted.  Bone contact was made.  Omnipaque 180 was injected x 0.5 mL demonstrating no intravascular uptake.  Then a solution containing one mL of 4 mg per mL dexamethasone and 3 mL if 2% MPF lidocaine was injected x 0.5 mL.  This same procedure was performed on the right  side using the same needle, technique and injectate.  Patient tolerated procedure well.  Post procedure instructions were given. 

## 2015-08-29 NOTE — Patient Instructions (Signed)

## 2015-09-03 ENCOUNTER — Encounter: Payer: Self-pay | Admitting: Physical Medicine & Rehabilitation

## 2015-09-08 ENCOUNTER — Telehealth: Payer: Self-pay | Admitting: Internal Medicine

## 2015-09-08 DIAGNOSIS — I10 Essential (primary) hypertension: Secondary | ICD-10-CM

## 2015-09-08 DIAGNOSIS — M47816 Spondylosis without myelopathy or radiculopathy, lumbar region: Secondary | ICD-10-CM

## 2015-09-08 MED ORDER — GABAPENTIN 800 MG PO TABS: 1200.0000 mg | ORAL_TABLET | Freq: Three times a day (TID) | ORAL | Status: DC

## 2015-09-08 MED ORDER — AMLODIPINE BESYLATE 10 MG PO TABS: 10.0000 mg | ORAL_TABLET | Freq: Every day | ORAL | Status: DC

## 2015-09-08 NOTE — Telephone Encounter (Signed)
[facility]

## 2015-09-08 NOTE — Telephone Encounter (Signed)
Patient called requesting medication refill on amLODipine (NORVASC) 10 MG tablet,gabapentin (NEURONTIN) 1,200 MG tablet. Patient states she will be out of medication tomorrow, please f/up

## 2015-09-16 ENCOUNTER — Other Ambulatory Visit: Payer: Self-pay | Admitting: Internal Medicine

## 2015-09-16 DIAGNOSIS — G5603 Carpal tunnel syndrome, bilateral upper limbs: Secondary | ICD-10-CM | POA: Insufficient documentation

## 2015-09-16 DIAGNOSIS — M19042 Primary osteoarthritis, left hand: Secondary | ICD-10-CM

## 2015-09-16 DIAGNOSIS — M47812 Spondylosis without myelopathy or radiculopathy, cervical region: Secondary | ICD-10-CM | POA: Insufficient documentation

## 2015-09-16 DIAGNOSIS — M19041 Primary osteoarthritis, right hand: Secondary | ICD-10-CM | POA: Insufficient documentation

## 2015-09-16 HISTORY — DX: Carpal tunnel syndrome, bilateral upper limbs: G56.03

## 2015-09-16 MED FILL — AMLODIPINE BESYLATE 10 MG T: 10 | 30 days supply | Qty: 30 | Fill #1

## 2015-09-19 ENCOUNTER — Ambulatory Visit: Payer: Self-pay | Attending: Internal Medicine

## 2015-10-13 ENCOUNTER — Ambulatory Visit: Payer: Self-pay | Attending: Family Medicine | Admitting: Family Medicine

## 2015-10-13 ENCOUNTER — Encounter: Payer: Self-pay | Admitting: Family Medicine

## 2015-10-13 VITALS — BP 130/77 | HR 57 | Temp 98.2°F | Resp 16 | Ht 61.0 in | Wt 149.0 lb

## 2015-10-13 DIAGNOSIS — I10 Essential (primary) hypertension: Secondary | ICD-10-CM

## 2015-10-13 DIAGNOSIS — Z8619 Personal history of other infectious and parasitic diseases: Secondary | ICD-10-CM

## 2015-10-13 DIAGNOSIS — K7460 Unspecified cirrhosis of liver: Secondary | ICD-10-CM

## 2015-10-13 DIAGNOSIS — G5603 Carpal tunnel syndrome, bilateral upper limbs: Secondary | ICD-10-CM

## 2015-10-13 DIAGNOSIS — K7030 Alcoholic cirrhosis of liver without ascites: Secondary | ICD-10-CM | POA: Insufficient documentation

## 2015-10-13 DIAGNOSIS — Z79899 Other long term (current) drug therapy: Secondary | ICD-10-CM | POA: Insufficient documentation

## 2015-10-13 DIAGNOSIS — M47816 Spondylosis without myelopathy or radiculopathy, lumbar region: Secondary | ICD-10-CM

## 2015-10-13 DIAGNOSIS — K210 Gastro-esophageal reflux disease with esophagitis: Secondary | ICD-10-CM | POA: Insufficient documentation

## 2015-10-13 DIAGNOSIS — F1010 Alcohol abuse, uncomplicated: Secondary | ICD-10-CM | POA: Insufficient documentation

## 2015-10-13 MED ORDER — GABAPENTIN 800 MG PO TABS: 1200.0000 mg | ORAL_TABLET | Freq: Three times a day (TID) | ORAL | Status: DC

## 2015-10-13 MED ORDER — AMLODIPINE BESYLATE 10 MG PO TABS: 10.0000 mg | ORAL_TABLET | Freq: Every day | ORAL | Status: DC

## 2015-10-13 MED ORDER — NORTRIPTYLINE HCL 25 MG PO CAPS: 25.0000 mg | ORAL_CAPSULE | Freq: Every day | ORAL | Status: DC

## 2015-10-13 MED FILL — NORTRIPTYLINE HCL 25 MG CAP: 25 | 30 days supply | Qty: 30 | Fill #0

## 2015-10-13 MED FILL — GABAPENTIN 800 MG TABLET: 800 | 30 days supply | Qty: 135 | Fill #0

## 2015-10-13 MED FILL — ?AMLODIPINE BESYLATE 10 MG: 10 | 30 days supply | Qty: 30 | Fill #0

## 2015-10-13 NOTE — Patient Instructions (Addendum)
Rachel Hudson was seen today for back pain.  Diagnoses and all orders for this visit:  Spondylosis of lumbar region without myelopathy or radiculopathy -     Ambulatory referral to Neurosurgery -     gabapentin (NEURONTIN) 800 MG tablet; Take 1.5 tablets (1,200 mg total) by mouth 3 (three) times daily. -     nortriptyline (PAMELOR) 25 MG capsule; Take 1 capsule (25 mg total) by mouth at bedtime.  Bilateral carpal tunnel syndrome -     gabapentin (NEURONTIN) 800 MG tablet; Take 1.5 tablets (1,200 mg total) by mouth 3 (three) times daily.  Essential hypertension -     amLODipine (NORVASC) 10 MG tablet; Take 1 tablet (10 mg total) by mouth daily.  History of hepatitis C  Cirrhosis of liver without ascites, unspecified hepatic cirrhosis type (HCC) -     Cancel: US Abdomen Complete; Future -     US Abdomen Limited; Future   Your hep C has been undetectable since 07/2014. Last check was 05/2015.  Will get liver ultrasound to evaluate cirrhosis.  F/u in 3-4 weeks for pap smear   Dr. Armen PickupFunches

## 2015-10-13 NOTE — Assessment & Plan Note (Signed)
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

## 2015-10-13 NOTE — Progress Notes (Signed)
Subjective:  Patient ID: Rachel Hudson, female    DOB: Jun 04, 1960  Age: 55 y.o. MRN: [id number]  CC: Back Pain   HPI Rachel CottaFrances M Stille presents for   1. Chronic low back pain: since 2014. 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 This causes severe foraminal encroachment and compression of the L4 nerve root bilaterally. Remaining disc spaces appear normal.  She gets bilateral lumbar L3, L4 medial branch blocks and L5 dorsal ramus injections done by pain management which help.  Gabapentin helps but she has ran out. She has low back pain and stiffness. She request referral to neurosurgery. She is uninsured.   2. HTN: taking norvasc 10 mg daily. Intermittent light HA. No CP, SOB or edema.   3. Cirrhosis: due to hep C and alcohol abuse. She had Hep C treated. Hep C has been undetectable since 07/2014. She has not abdominal pain or swelling. No leg swelling.  She has established with GI who did EGD and colonoscopy in 06/2015. She has no varices on EGD. She has erosive reflux esophagitis. Last limited abdominal US was 10/14/2014.   Social History  Substance Use Topics  . Smoking status: Never Smoker   . Smokeless tobacco: Never Used     Comment: smokes Weed occasionally  . Alcohol Use: No     Comment: 2 pints per week/stopped 01/10/14   Outpatient Prescriptions Prior to Visit  Medication Sig Dispense Refill  . Multiple Vitamin (MULTIVITAMIN WITH MINERALS) TABS tablet Take 1 tablet by mouth daily.    Marland Kitchen. amLODipine (NORVASC) 10 MG tablet Take 1 tablet (10 mg total) by mouth daily. (Patient not taking: Reported on 10/13/2015) 30 tablet 2  . gabapentin (NEURONTIN) 800 MG tablet Take 1.5 tablets (1,200 mg total) by mouth 3 (three) times daily. (Patient not taking: Reported on 10/13/2015) 90 tablet 2  . nortriptyline (PAMELOR) 25 MG capsule Take 1 capsule (25 mg total) by mouth at bedtime. (Patient not taking: Reported on  10/13/2015) 30 capsule 2   No facility-administered medications prior to visit.    ROS Review of Systems  Constitutional: Negative for fever and chills.  Eyes: Negative for visual disturbance.  Respiratory: Negative for shortness of breath.   Cardiovascular: Negative for chest pain.  Gastrointestinal: Negative for abdominal pain and blood in stool.  Musculoskeletal: Positive for back pain. Negative for arthralgias.  Skin: Negative for rash.  Allergic/Immunologic: Negative for immunocompromised state.  Hematological: Negative for adenopathy. Does not bruise/bleed easily.  Psychiatric/Behavioral: Negative for suicidal ideas and dysphoric mood.    Objective:  BP 130/77 mmHg  Pulse 57  Temp(Src) 98.2 F (36.8 C) (Oral)  Resp 16  Ht 5\' 1"  (1.549 m)  Wt 149 lb (67.586 kg)  BMI 28.17 kg/m2  SpO2 99%  LMP 09/09/2015  BP/Weight 10/13/2015 08/29/2015 08/07/2015  Systolic BP 130 130 139  Diastolic BP 77 70 75  Wt. (Lbs) 149 - -  BMI 28.17 - -    Physical Exam  Constitutional: She is oriented to person, place, and time. She appears well-developed and well-nourished. No distress.  HENT:  Head: Normocephalic and atraumatic.  Cardiovascular: Normal rate, regular rhythm, normal heart sounds and intact distal pulses.   Pulmonary/Chest: Effort normal and breath sounds normal.  Musculoskeletal: She exhibits no edema.  Neurological: She is alert and oriented to person, place, and time.  Skin: Skin is warm and dry. No rash noted.  Psychiatric: She  has a normal mood and affect.    Assessment & Plan:   Hoda was seen today for back pain.  Diagnoses and all orders for this visit:  Spondylosis of lumbar region without myelopathy or radiculopathy -     Ambulatory referral to Neurosurgery -     gabapentin (NEURONTIN) 800 MG tablet; Take 1.5 tablets (1,200 mg total) by mouth 3 (three) times daily. -     nortriptyline (PAMELOR) 25 MG capsule; Take 1 capsule (25 mg total) by mouth at  bedtime.  Bilateral carpal tunnel syndrome -     gabapentin (NEURONTIN) 800 MG tablet; Take 1.5 tablets (1,200 mg total) by mouth 3 (three) times daily.  Essential hypertension -     amLODipine (NORVASC) 10 MG tablet; Take 1 tablet (10 mg total) by mouth daily.  History of hepatitis C  Cirrhosis of liver without ascites, unspecified hepatic cirrhosis type (HCC) -     Cancel: US Abdomen Complete; Future -     US Abdomen Limited; Future   There are no diagnoses linked to this encounter.  No orders of the defined types were placed in this encounter.    Follow-up: No Follow-up on file.   Dessa Phi MD

## 2015-10-13 NOTE — Assessment & Plan Note (Signed)
Cirrhosis without ascites or varices  F/u liver ultrasound ordered

## 2015-10-13 NOTE — Assessment & Plan Note (Signed)
Well controlled norvasc refilled

## 2015-10-13 NOTE — Progress Notes (Signed)
Medicine refills  No pain today  No suicidal thoughts in the past two weeks No tobacco user

## 2015-10-21 ENCOUNTER — Other Ambulatory Visit: Payer: Self-pay | Admitting: Orthopedic Surgery

## 2015-10-24 ENCOUNTER — Other Ambulatory Visit: Payer: Self-pay | Admitting: Family Medicine

## 2015-10-24 ENCOUNTER — Ambulatory Visit (HOSPITAL_COMMUNITY)
Admission: RE | Admit: 2015-10-24 | Discharge: 2015-10-24 | Disposition: A | Discharge status: Home / Self Care | Payer: Self-pay | Source: Ambulatory Visit | Attending: Family Medicine | Admitting: Family Medicine

## 2015-10-24 DIAGNOSIS — K7460 Unspecified cirrhosis of liver: Secondary | ICD-10-CM | POA: Insufficient documentation

## 2015-10-31 ENCOUNTER — Telehealth: Payer: Self-pay | Admitting: *Deleted

## 2015-10-31 NOTE — Telephone Encounter (Signed)
LVM to return call.

## 2015-10-31 NOTE — Telephone Encounter (Signed)
-----   Message from Dessa PhiJosalyn Funches, MD sent at 10/24/2015  9:10 AM EDT ----- There are chronic cirrhotic changes in the liver No suspicious mass

## 2015-11-05 ENCOUNTER — Encounter (HOSPITAL_BASED_OUTPATIENT_CLINIC_OR_DEPARTMENT_OTHER): Payer: Self-pay | Admitting: *Deleted

## 2015-11-10 ENCOUNTER — Encounter: Payer: Self-pay | Admitting: Family Medicine

## 2015-11-10 ENCOUNTER — Telehealth: Payer: Self-pay | Admitting: Physical Medicine & Rehabilitation

## 2015-11-10 ENCOUNTER — Ambulatory Visit: Payer: Self-pay | Attending: Family Medicine | Admitting: Family Medicine

## 2015-11-10 ENCOUNTER — Encounter (HOSPITAL_BASED_OUTPATIENT_CLINIC_OR_DEPARTMENT_OTHER)
Admission: RE | Admit: 2015-11-10 | Discharge: 2015-11-10 | Disposition: A | Discharge status: Home / Self Care | Payer: Self-pay | Source: Ambulatory Visit | Attending: Orthopedic Surgery | Admitting: Orthopedic Surgery

## 2015-11-10 ENCOUNTER — Other Ambulatory Visit: Payer: Self-pay

## 2015-11-10 VITALS — BP 142/79 | HR 54 | Temp 98.2°F | Resp 16 | Ht 61.0 in | Wt 151.0 lb

## 2015-11-10 DIAGNOSIS — R896 Abnormal cytological findings in specimens from other organs, systems and tissues: Secondary | ICD-10-CM

## 2015-11-10 DIAGNOSIS — IMO0002 Reserved for concepts with insufficient information to code with codable children: Secondary | ICD-10-CM

## 2015-11-10 DIAGNOSIS — Z124 Encounter for screening for malignant neoplasm of cervix: Secondary | ICD-10-CM | POA: Insufficient documentation

## 2015-11-10 DIAGNOSIS — Z79899 Other long term (current) drug therapy: Secondary | ICD-10-CM | POA: Insufficient documentation

## 2015-11-10 LAB — COMPREHENSIVE METABOLIC PANEL
ALK PHOS: 56 U/L (ref 38–126)
ALT: 18 U/L (ref 14–54)
ANION GAP: 8 (ref 5–15)
AST: 21 U/L (ref 15–41)
Albumin: 4.3 g/dL (ref 3.5–5.0)
BILIRUBIN TOTAL: 0.5 mg/dL (ref 0.3–1.2)
BUN: 9 mg/dL (ref 6–20)
CALCIUM: 9.6 mg/dL (ref 8.9–10.3)
CO2: 26 mmol/L (ref 22–32)
Chloride: 106 mmol/L (ref 101–111)
Creatinine, Ser: 0.7 mg/dL (ref 0.44–1.00)
Glucose, Bld: 84 mg/dL (ref 65–99)
POTASSIUM: 4.1 mmol/L (ref 3.5–5.1)
SODIUM: 140 mmol/L (ref 135–145)
TOTAL PROTEIN: 7.9 g/dL (ref 6.5–8.1)

## 2015-11-10 NOTE — Telephone Encounter (Signed)
Patient would like to schedule another back injection.  She states last injection helped her, but she thinks she will need another one in a month or two.  What injection would you like to repeat?  Please call patient and schedule.

## 2015-11-10 NOTE — Progress Notes (Signed)
Gyn physical  Sexually active, no pain with intercourse  No vaginal discharge no odor No pain today No suicidal thoughts in the past two week s No tobacco user

## 2015-11-10 NOTE — Patient Instructions (Signed)
Rachel Hudson was seen today for gynecologic exam.  Diagnoses and all orders for this visit:  Papanicolaou smear for cervical cancer screening -     Cytology - PAP    F.u in 3 months for HTN  Dr. Armen PickupFunches

## 2015-11-10 NOTE — Progress Notes (Signed)
   Subjective:  Patient ID: Rachel Hudson, female    DOB: 1960-06-03  Age: 55 y.o. MRN: [id number]  CC: Gynecologic Exam   HPI Rachel Hudson presents for    1. Gyn physical: due for pap. No vaginal discharge. No pain with intercourse. No genital lesions.  Social History  Substance Use Topics  . Smoking status: Never Smoker   . Smokeless tobacco: Never Used     Comment: smokes Weed daily  . Alcohol Use: No     Comment: 2 pints per week/stopped 01/10/14    Outpatient Prescriptions Prior to Visit  Medication Sig Dispense Refill  . amLODipine (NORVASC) 10 MG tablet Take 1 tablet (10 mg total) by mouth daily. 30 tablet 5  . gabapentin (NEURONTIN) 800 MG tablet Take 1.5 tablets (1,200 mg total) by mouth 3 (three) times daily. 135 tablet 11  . Multiple Vitamin (MULTIVITAMIN WITH MINERALS) TABS tablet Take 1 tablet by mouth daily.    . nortriptyline (PAMELOR) 25 MG capsule Take 1 capsule (25 mg total) by mouth at bedtime. 30 capsule 5   No facility-administered medications prior to visit.    ROS Review of Systems  Constitutional: Negative for fever and chills.  Eyes: Negative for visual disturbance.  Respiratory: Negative for shortness of breath.   Cardiovascular: Negative for chest pain.  Gastrointestinal: Negative for abdominal pain and blood in stool.  Musculoskeletal: Positive for back pain. Negative for arthralgias.  Skin: Negative for rash.  Allergic/Immunologic: Negative for immunocompromised state.  Hematological: Negative for adenopathy. Does not bruise/bleed easily.  Psychiatric/Behavioral: Negative for suicidal ideas and dysphoric mood.    Objective:  BP 142/79 mmHg  Pulse 54  Temp(Src) 98.2 F (36.8 C) (Oral)  Resp 16  Ht 5\' 1"  (1.549 m)  Wt 151 lb (68.493 kg)  BMI 28.55 kg/m2  SpO2 98%  LMP 10/15/2015  BP/Weight 11/10/2015 11/05/2015 10/13/2015  Systolic BP 142 - 130  Diastolic BP 79 - 77  Wt. (Lbs) 151 149 149  BMI 28.55 - 28.17   Physical Exam    Constitutional: She is oriented to person, place, and time. She appears well-developed and well-nourished. No distress.  HENT:  Head: Normocephalic and atraumatic.  Cardiovascular: Normal rate, regular rhythm, normal heart sounds and intact distal pulses.   Pulmonary/Chest: Effort normal and breath sounds normal.  Genitourinary: Vagina normal and uterus normal. Guaiac positive stool. Cervix exhibits discharge. Cervix exhibits no motion tenderness and no friability. No vaginal discharge found.  Cervix is posterior Nulliparous   Musculoskeletal: She exhibits no edema.  Neurological: She is alert and oriented to person, place, and time.  Skin: Skin is warm and dry. No rash noted.  Psychiatric: She has a normal mood and affect.     Assessment & Plan:   Rachel Hudson was seen today for gynecologic exam.  Diagnoses and all orders for this visit:  Papanicolaou smear for cervical cancer screening -     Cytology - PAP    No orders of the defined types were placed in this encounter.    Follow-up: Return in about 3 months (around 02/10/2016) for HTN .   Dessa PhiJosalyn Bertrum Helmstetter MD

## 2015-11-10 NOTE — Telephone Encounter (Signed)
Pt here in clinic  Results given  Pt verbalized understanding

## 2015-11-11 ENCOUNTER — Telehealth: Payer: Self-pay | Admitting: *Deleted

## 2015-11-11 ENCOUNTER — Ambulatory Visit: Payer: Self-pay | Admitting: Physical Medicine & Rehabilitation

## 2015-11-11 LAB — CERVICOVAGINAL ANCILLARY ONLY
Chlamydia: NEGATIVE
NEISSERIA GONORRHEA: NEGATIVE
WET PREP (BD AFFIRM): NEGATIVE

## 2015-11-11 LAB — CYTOLOGY - PAP

## 2015-11-11 NOTE — Telephone Encounter (Signed)
Please advise on what to schedule? She had a Bil MBB in April.

## 2015-11-11 NOTE — Telephone Encounter (Signed)
Schedule bilateral medial branch blocks L3 L4 L5 in 1 month

## 2015-11-11 NOTE — Telephone Encounter (Signed)
Unable to LVM.

## 2015-11-11 NOTE — Telephone Encounter (Signed)
-----   Message from Dessa PhiJosalyn Funches, MD sent at 11/11/2015 11:00 AM EDT ----- Negative screening GC/chlam

## 2015-11-11 NOTE — Telephone Encounter (Signed)
Please schedule appt! Thanks

## 2015-11-13 ENCOUNTER — Encounter (HOSPITAL_BASED_OUTPATIENT_CLINIC_OR_DEPARTMENT_OTHER): Payer: Self-pay | Admitting: Anesthesiology

## 2015-11-13 ENCOUNTER — Ambulatory Visit (HOSPITAL_BASED_OUTPATIENT_CLINIC_OR_DEPARTMENT_OTHER): Payer: Self-pay | Admitting: Anesthesiology

## 2015-11-13 ENCOUNTER — Encounter (HOSPITAL_BASED_OUTPATIENT_CLINIC_OR_DEPARTMENT_OTHER)
Admission: RE | Disposition: A | Discharge status: Home / Self Care | Payer: Self-pay | Source: Ambulatory Visit | Attending: Orthopedic Surgery

## 2015-11-13 ENCOUNTER — Ambulatory Visit (HOSPITAL_BASED_OUTPATIENT_CLINIC_OR_DEPARTMENT_OTHER)
Admission: RE | Admit: 2015-11-13 | Discharge: 2015-11-13 | Disposition: A | Discharge status: Home / Self Care | Payer: Self-pay | Source: Ambulatory Visit | Attending: Orthopedic Surgery | Admitting: Orthopedic Surgery

## 2015-11-13 DIAGNOSIS — K219 Gastro-esophageal reflux disease without esophagitis: Secondary | ICD-10-CM | POA: Insufficient documentation

## 2015-11-13 DIAGNOSIS — K7460 Unspecified cirrhosis of liver: Secondary | ICD-10-CM | POA: Insufficient documentation

## 2015-11-13 DIAGNOSIS — Z8711 Personal history of peptic ulcer disease: Secondary | ICD-10-CM | POA: Insufficient documentation

## 2015-11-13 DIAGNOSIS — Z8619 Personal history of other infectious and parasitic diseases: Secondary | ICD-10-CM | POA: Insufficient documentation

## 2015-11-13 DIAGNOSIS — Z79899 Other long term (current) drug therapy: Secondary | ICD-10-CM | POA: Insufficient documentation

## 2015-11-13 DIAGNOSIS — I10 Essential (primary) hypertension: Secondary | ICD-10-CM | POA: Insufficient documentation

## 2015-11-13 DIAGNOSIS — M1990 Unspecified osteoarthritis, unspecified site: Secondary | ICD-10-CM | POA: Insufficient documentation

## 2015-11-13 DIAGNOSIS — IMO0002 Reserved for concepts with insufficient information to code with codable children: Secondary | ICD-10-CM | POA: Insufficient documentation

## 2015-11-13 DIAGNOSIS — G709 Myoneural disorder, unspecified: Secondary | ICD-10-CM | POA: Insufficient documentation

## 2015-11-13 DIAGNOSIS — G5601 Carpal tunnel syndrome, right upper limb: Secondary | ICD-10-CM | POA: Insufficient documentation

## 2015-11-13 HISTORY — PX: CARPAL TUNNEL RELEASE: SHX101

## 2015-11-13 HISTORY — DX: Gastro-esophageal reflux disease without esophagitis: K21.9

## 2015-11-13 SURGERY — CARPAL TUNNEL RELEASE
Anesthesia: General | Site: Wrist | Laterality: Right

## 2015-11-13 MED ORDER — FENTANYL CITRATE (PF) 100 MCG/2ML IJ SOLN: 25.0000 ug | INTRAMUSCULAR | Status: DC | PRN

## 2015-11-13 MED ORDER — PROPOFOL 10 MG/ML IV BOLUS
INTRAVENOUS | Status: AC
  Filled 2015-11-13: qty 40

## 2015-11-13 MED ORDER — LIDOCAINE 2% (20 MG/ML) 5 ML SYRINGE
INTRAMUSCULAR | Status: DC | PRN
  Administered 2015-11-13: 50 mg via INTRAVENOUS

## 2015-11-13 MED ORDER — HYDROCODONE-IBUPROFEN 5-200 MG PO TABS: ORAL_TABLET | ORAL | Status: DC

## 2015-11-13 MED ORDER — GLYCOPYRROLATE 0.2 MG/ML IJ SOLN
0.2000 mg | Freq: Once | INTRAMUSCULAR | Status: AC | PRN
  Administered 2015-11-13: 0.2 mg via INTRAVENOUS

## 2015-11-13 MED ORDER — MIDAZOLAM HCL 5 MG/5ML IJ SOLN
INTRAMUSCULAR | Status: DC | PRN
  Administered 2015-11-13: 2 mg via INTRAVENOUS

## 2015-11-13 MED ORDER — FENTANYL CITRATE (PF) 100 MCG/2ML IJ SOLN: 50.0000 ug | INTRAMUSCULAR | Status: DC | PRN

## 2015-11-13 MED ORDER — FENTANYL CITRATE (PF) 100 MCG/2ML IJ SOLN
INTRAMUSCULAR | Status: DC | PRN
  Administered 2015-11-13: 100 ug via INTRAVENOUS

## 2015-11-13 MED ORDER — LIDOCAINE 2% (20 MG/ML) 5 ML SYRINGE
INTRAMUSCULAR | Status: AC
  Filled 2015-11-13: qty 5

## 2015-11-13 MED ORDER — MIDAZOLAM HCL 2 MG/2ML IJ SOLN: 1.0000 mg | INTRAMUSCULAR | Status: DC | PRN

## 2015-11-13 MED ORDER — ONDANSETRON HCL 4 MG/2ML IJ SOLN
INTRAMUSCULAR | Status: DC | PRN
  Administered 2015-11-13: 4 mg via INTRAVENOUS

## 2015-11-13 MED ORDER — ONDANSETRON HCL 4 MG/2ML IJ SOLN
INTRAMUSCULAR | Status: AC
  Filled 2015-11-13: qty 2

## 2015-11-13 MED ORDER — OXYCODONE HCL 5 MG/5ML PO SOLN: 5.0000 mg | Freq: Once | ORAL | Status: AC | PRN

## 2015-11-13 MED ORDER — SCOPOLAMINE 1 MG/3DAYS TD PT72: 1.0000 | MEDICATED_PATCH | Freq: Once | TRANSDERMAL | Status: DC | PRN

## 2015-11-13 MED ORDER — VANCOMYCIN HCL IN DEXTROSE 1-5 GM/200ML-% IV SOLN
INTRAVENOUS | Status: AC
Start: 2015-11-13 — End: 2015-11-13
  Filled 2015-11-13: qty 200

## 2015-11-13 MED ORDER — OXYCODONE HCL 5 MG PO TABS
5.0000 mg | ORAL_TABLET | Freq: Once | ORAL | Status: AC | PRN
  Administered 2015-11-13: 5 mg via ORAL

## 2015-11-13 MED ORDER — ONDANSETRON HCL 4 MG/2ML IJ SOLN
4.0000 mg | Freq: Four times a day (QID) | INTRAMUSCULAR | Status: DC | PRN
Start: 2015-11-13 — End: 2015-11-13

## 2015-11-13 MED ORDER — BUPIVACAINE HCL (PF) 0.25 % IJ SOLN
INTRAMUSCULAR | Status: DC | PRN
  Administered 2015-11-13: 10 mL

## 2015-11-13 MED ORDER — LACTATED RINGERS IV SOLN
INTRAVENOUS | Status: DC
  Administered 2015-11-13: 09:00:00 via INTRAVENOUS

## 2015-11-13 MED ORDER — CHLORHEXIDINE GLUCONATE 4 % EX LIQD: 60.0000 mL | Freq: Once | CUTANEOUS | Status: DC

## 2015-11-13 MED ORDER — FENTANYL CITRATE (PF) 100 MCG/2ML IJ SOLN
INTRAMUSCULAR | Status: AC
  Filled 2015-11-13: qty 2

## 2015-11-13 MED ORDER — VANCOMYCIN HCL IN DEXTROSE 1-5 GM/200ML-% IV SOLN
1000.0000 mg | INTRAVENOUS | Status: AC
  Administered 2015-11-13: 1000 mg via INTRAVENOUS

## 2015-11-13 MED ORDER — DEXAMETHASONE SODIUM PHOSPHATE 4 MG/ML IJ SOLN
INTRAMUSCULAR | Status: DC | PRN
  Administered 2015-11-13: 10 mg via INTRAVENOUS

## 2015-11-13 MED ORDER — DEXAMETHASONE SODIUM PHOSPHATE 10 MG/ML IJ SOLN
INTRAMUSCULAR | Status: AC
  Filled 2015-11-13: qty 1

## 2015-11-13 MED ORDER — GLYCOPYRROLATE 0.2 MG/ML IV SOSY
PREFILLED_SYRINGE | INTRAVENOUS | Status: AC
  Filled 2015-11-13: qty 3

## 2015-11-13 MED ORDER — OXYCODONE HCL 5 MG PO TABS
ORAL_TABLET | ORAL | Status: AC
  Filled 2015-11-13: qty 1

## 2015-11-13 MED ORDER — MIDAZOLAM HCL 2 MG/2ML IJ SOLN
INTRAMUSCULAR | Status: AC
  Filled 2015-11-13: qty 2

## 2015-11-13 MED ORDER — PROPOFOL 10 MG/ML IV BOLUS
INTRAVENOUS | Status: DC | PRN
  Administered 2015-11-13: 230 mg via INTRAVENOUS
  Administered 2015-11-13: 30 mg via INTRAVENOUS

## 2015-11-13 SURGICAL SUPPLY — 35 items
BANDAGE ACE 3X5.8 VEL STRL LF (GAUZE/BANDAGES/DRESSINGS) ×3 IMPLANT
BANDAGE ACE 4X5 VEL STRL LF (GAUZE/BANDAGES/DRESSINGS) ×3 IMPLANT
BLADE SURG 15 STRL LF DISP TIS (BLADE) ×2 IMPLANT
BLADE SURG 15 STRL SS (BLADE) ×4
BNDG ESMARK 4X9 LF (GAUZE/BANDAGES/DRESSINGS) ×3 IMPLANT
BNDG GAUZE ELAST 4 BULKY (GAUZE/BANDAGES/DRESSINGS) ×3 IMPLANT
CHLORAPREP W/TINT 26ML (MISCELLANEOUS) ×3 IMPLANT
CORDS BIPOLAR (ELECTRODE) ×3 IMPLANT
COVER BACK TABLE 60X90IN (DRAPES) ×3 IMPLANT
COVER MAYO STAND STRL (DRAPES) ×3 IMPLANT
CUFF TOURNIQUET SINGLE 18IN (TOURNIQUET CUFF) ×3 IMPLANT
DRAPE EXTREMITY T 121X128X90 (DRAPE) ×3 IMPLANT
DRAPE SURG 17X23 STRL (DRAPES) ×3 IMPLANT
DRSG PAD ABDOMINAL 8X10 ST (GAUZE/BANDAGES/DRESSINGS) ×3 IMPLANT
GAUZE SPONGE 4X4 12PLY STRL (GAUZE/BANDAGES/DRESSINGS) ×3 IMPLANT
GAUZE XEROFORM 1X8 LF (GAUZE/BANDAGES/DRESSINGS) ×3 IMPLANT
GLOVE BIO SURGEON STRL SZ7.5 (GLOVE) ×3 IMPLANT
GLOVE BIOGEL PI IND STRL 8 (GLOVE) ×3 IMPLANT
GLOVE BIOGEL PI INDICATOR 8 (GLOVE) ×6
GLOVE SURG SS PI 8.0 STRL IVOR (GLOVE) ×3 IMPLANT
GOWN STRL REUS W/ TWL LRG LVL3 (GOWN DISPOSABLE) ×1 IMPLANT
GOWN STRL REUS W/TWL LRG LVL3 (GOWN DISPOSABLE) ×2
GOWN STRL REUS W/TWL XL LVL3 (GOWN DISPOSABLE) ×3 IMPLANT
NEEDLE HYPO 25X1 1.5 SAFETY (NEEDLE) ×3 IMPLANT
NS IRRIG 1000ML POUR BTL (IV SOLUTION) ×3 IMPLANT
PACK BASIN DAY SURGERY FS (CUSTOM PROCEDURE TRAY) ×3 IMPLANT
PADDING CAST ABS 4INX4YD NS (CAST SUPPLIES) ×2
PADDING CAST ABS COTTON 4X4 ST (CAST SUPPLIES) ×1 IMPLANT
SPONGE GAUZE 4X4 12PLY STER LF (GAUZE/BANDAGES/DRESSINGS) ×3 IMPLANT
STOCKINETTE 4X48 STRL (DRAPES) ×3 IMPLANT
SUT ETHILON 4 0 PS 2 18 (SUTURE) ×3 IMPLANT
SYR BULB 3OZ (MISCELLANEOUS) ×3 IMPLANT
SYR CONTROL 10ML LL (SYRINGE) ×3 IMPLANT
TOWEL OR 17X24 6PK STRL BLUE (TOWEL DISPOSABLE) ×6 IMPLANT
UNDERPAD 30X30 (UNDERPADS AND DIAPERS) ×3 IMPLANT

## 2015-11-13 NOTE — H&P (Signed)
Rachel Hudson is an 55 y.o. female.   Chief Complaint: right carpal tunnel syndrome HPI: 55 yo female with numbness and tingling in both hands.  This is bothersome to her.  She has positive nerve conduction studies and wishes to have a carpal tunnel release for management of her symptoms.  Allergies:  Allergies  Allergen Reactions  . Penicillins     Facial swelling Has patient had a PCN reaction causing immediate rash, facial/tongue/throat swelling, SOB or lightheadedness with hypotension: Yes Has patient had a PCN reaction causing severe rash involving mucus membranes or skin necrosis: No Has patient had a PCN reaction that required hospitalization No Has patient had a PCN reaction occurring within the last 10 years: Yes If all of the above answers are "NO", then may proceed with Cephalosporin use.   . Tylenol [Acetaminophen] Other (See Comments)    HX of Hep. C    Past Medical History  Diagnosis Date  . Hypertension   . Hep C w/o coma, chronic (HCC) As of 10/22/13  . Cirrhosis of liver [facility](HCC) September 2015    Stage 4  . GERD (gastroesophageal reflux disease)     Past Surgical History  Procedure Laterality Date  . Ligament repair Right 1980    Knee  . Colonoscopy N/A 07/08/2015    Procedure: COLONOSCOPY;  Surgeon: Corbin Adeobert M Rourk, MD;  Location: [facility];  Service: Endoscopy;  Laterality: N/A;  230  . Esophagogastroduodenoscopy N/A 07/08/2015    Procedure: ESOPHAGOGASTRODUODENOSCOPY (EGD);  Surgeon: Corbin Adeobert M Rourk, MD;  Location: [facility];  Service: Endoscopy;  Laterality: N/A;    Family History: Family History  Problem Relation Age of Onset  . Adopted: Yes  . Family history unknown: Yes    Social History:   reports that she has never smoked. She has never used smokeless tobacco. She reports that she uses illicit drugs (Marijuana) about 7 times per week. She reports that she does not drink alcohol.  Medications: Medications Prior to Admission  Medication  Sig Dispense Refill  . amLODipine (NORVASC) 10 MG tablet Take 1 tablet (10 mg total) by mouth daily. 30 tablet 5  . gabapentin (NEURONTIN) 800 MG tablet Take 1.5 tablets (1,200 mg total) by mouth 3 (three) times daily. 135 tablet 11  . Multiple Vitamin (MULTIVITAMIN WITH MINERALS) TABS tablet Take 1 tablet by mouth daily.    . nortriptyline (PAMELOR) 25 MG capsule Take 1 capsule (25 mg total) by mouth at bedtime. 30 capsule 5    No results found for this or any previous visit (from the past 48 hour(s)).  No results found.   A comprehensive review of systems was negative.  Blood pressure 128/85, pulse 56, temperature 98.1 F (36.7 C), temperature source Oral, resp. rate 18, height 5\' 1"  (1.549 m), weight 68.04 kg (150 lb), last menstrual period 10/19/2015, SpO2 100 %.  General appearance: alert, cooperative and appears stated age Head: Normocephalic, without obvious abnormality, atraumatic Neck: supple, symmetrical, trachea midline Resp: clear to auscultation bilaterally Cardio: regular rate and rhythm GI: non-tender Extremities: Intact sensation and capillary refill all digits.  +epl/fpl/io.  No wounds.  Pulses: 2+ and symmetric Skin: Skin color, texture, turgor normal. No rashes or lesions Neurologic: Grossly normal Incision/Wound:none  Assessment/Plan Right carpal tunnel syndrome.  Non operative and operative treatment options were discussed with the patient and patient wishes to proceed with operative treatment. Risks, benefits, and alternatives of surgery were discussed and the patient agrees with the plan of care.  Jemari Hallum R 11/13/2015, 8:38 AM

## 2015-11-13 NOTE — Anesthesia Postprocedure Evaluation (Signed)
Anesthesia Post Note  Patient: Craige CottaFrances M Morais  Procedure(s) Performed: Procedure(s) (LRB): RIGHT CARPAL TUNNEL RELEASE (Right)  Patient location during evaluation: PACU Anesthesia Type: General Level of consciousness: awake and alert and patient cooperative Pain management: pain level controlled Vital Signs Assessment: post-procedure vital signs reviewed and stable Respiratory status: spontaneous breathing and respiratory function stable Cardiovascular status: stable Anesthetic complications: no    Last Vitals:  Filed Vitals:   11/13/15 1045 11/13/15 1100  BP: 131/90 137/96  Pulse: 68 64  Temp:  36.5 C  Resp: 23 16    Last Pain:  Filed Vitals:   11/13/15 1103  PainSc: 10-Worst pain ever                 Jatoya Armbrister S

## 2015-11-13 NOTE — Op Note (Deleted)
NAMErnest Pine:  [place], [place]              ACCOUNT NO.:  [id number]  MEDICAL RECORD NO.:  [id number]  LOCATION:                               FACILITY:  [facility]  PHYSICIAN:  Betha LoaKevin Dailen Mcclish, MD        DATE OF BIRTH:  [birth year]  DATE OF PROCEDURE:  11/11/2015 DATE OF DISCHARGE:                              OPERATIVE REPORT   PREOPERATIVE DIAGNOSIS:  Right carpal tunnel syndrome.  POSTOPERATIVE DIAGNOSIS:  Right carpal tunnel syndrome.  PROCEDURE:  Right carpal tunnel release.  SURGEON:  Betha LoaKevin Bryston Colocho, MD.  ASSISTANT:  None.  ANESTHESIA:  General.  IV FLUIDS:  Per Anesthesia flow sheet.  ESTIMATED BLOOD LOSS:  Minimal.  COMPLICATIONS:  None.  SPECIMENS:  None.  TOURNIQUET TIME:  14 minutes.  DISPOSITION:  Stable to PACU.  INDICATIONS:  Ms. Rachel Hudson is a 55 year old female, who has had numbness and tingling in bilateral hands.  She has positive nerve conduction studies.  She wished to have a carpal tunnel release for management of her symptoms.  Risks, benefits, and alternatives of the surgery were discussed including the risk of blood loss; infection; damage to nerves, vessels, tendons, ligaments, bone; failure of surgery; need for additional surgery; complications with wound healing; continued pain; recurrence of carpal tunnel syndrome and damage motor branch bridge. They voiced understanding of these risks and elected to proceed.  OPERATIVE COURSE:  After being identified preoperatively by myself, the patient and I agreed upon procedure and site of procedure.  Surgical site was marked.  Risks, benefits, and alternatives of surgery were reviewed and she wished to proceed.  Surgical consent had been signed. She was given IV vancomycin as preoperative antibiotic prophylaxis due to penicillin allergy.  She was transferred to the operating room and placed on operating table in supine position with the right upper extremity on arm board.  General anesthesia was induced  by Anesthesiology.  Right upper extremity was prepped and draped in normal sterile orthopedic fashion.  Surgical pause was performed between surgeons, Anesthesia, operating staff, and all were in agreement as to the patient, procedure, site procedure.  Tourniquet at the proximal aspect of the extremity was inflated to 250 mmHg after exsanguination of the limb with Esmarch bandage.  Incision was made over the transverse carpal ligament.  This was carried into subcutaneous tissues by spreading technique.  The palmar fascia was sharply incised.  Bipolar electrocautery was used to obtain hemostasis.  Transverse carpal ligament was sharply incised.  It was incised distally first.  Care was taken to ensure complete decompression distally.  It was incised proximally.  Scissors were used to split the distal aspect of the volar antebrachial fascia.  A finger was placed into the wound to ensure complete decompression, which was the case.  The nerve was inspected. It was flat and hyperemic.  The motor branch was identified and was intact.  The wound was copiously irrigated with sterile saline.  It was closed with 4-0 nylon in a horizontal mattress fashion.  It was injected with 10 mL of 0.25% plain Marcaine to aid in postoperative analgesia. It was then dressed with sterile Xeroform, 4x4s, and ABD and wrapped with Kerlix and Ace bandage.  Tourniquet deflated at 14 minutes. Fingertips were pink with brisk capillary refill after deflation of tourniquet.  Operative drapes were broken down.  The patient was awoken from anesthesia safely.  She was transferred back to stretcher and taken to PACU in stable condition.  I will see her back in the office in 1 week for postoperative followup.  I will give her Norco 5/325, 1-2 p.o. q.6 hours p.r.n. pain, dispensed #20.     Betha LoaKevin Zaara Sprowl, MD   ______________________________ Betha LoaKevin Ioanna Colquhoun, MD    KK/MEDQ  D:  11/13/2015  T:  11/13/2015  Job:  [id number]

## 2015-11-13 NOTE — Op Note (Signed)
NAME:  Rachel Hudson, Rachel Hudson              ACCOUNT NO.:  [id number]  MEDICAL RECORD NO.:  [id number]  LOCATION:                               FACILITY:  [facility]  PHYSICIAN:  Keyanna Sandefer, MD        DATE OF BIRTH:  04/20/1961  DATE OF PROCEDURE:  11/11/2015 DATE OF DISCHARGE:                              OPERATIVE REPORT   PREOPERATIVE DIAGNOSIS:  Right carpal tunnel syndrome.  POSTOPERATIVE DIAGNOSIS:  Right carpal tunnel syndrome.  PROCEDURE:  Right carpal tunnel release.  SURGEON:  Akio Hudnall, MD.  ASSISTANT:  None.  ANESTHESIA:  General.  IV FLUIDS:  Per Anesthesia flow sheet.  ESTIMATED BLOOD LOSS:  Minimal.  COMPLICATIONS:  None.  SPECIMENS:  None.  TOURNIQUET TIME:  14 minutes.  DISPOSITION:  Stable to PACU.  INDICATIONS:  Rachel Hudson is a 55-year-old female, who has had numbness and tingling in bilateral hands.  She has positive nerve conduction studies.  She wished to have a carpal tunnel release for management of her symptoms.  Risks, benefits, and alternatives of the surgery were discussed including the risk of blood loss; infection; damage to nerves, vessels, tendons, ligaments, bone; failure of surgery; need for additional surgery; complications with wound healing; continued pain; recurrence of carpal tunnel syndrome and damage motor branch bridge. They voiced understanding of these risks and elected to proceed.  OPERATIVE COURSE:  After being identified preoperatively by myself, the patient and I agreed upon procedure and site of procedure.  Surgical site was marked.  Risks, benefits, and alternatives of surgery were reviewed and she wished to proceed.  Surgical consent had been signed. She was given IV vancomycin as preoperative antibiotic prophylaxis due to penicillin allergy.  She was transferred to the operating room and placed on operating table in supine position with the right upper extremity on arm board.  General anesthesia was induced  by Anesthesiology.  Right upper extremity was prepped and draped in normal sterile orthopedic fashion.  Surgical pause was performed between surgeons, Anesthesia, operating staff, and all were in agreement as to the patient, procedure, site procedure.  Tourniquet at the proximal aspect of the extremity was inflated to 250 mmHg after exsanguination of the limb with Esmarch bandage.  Incision was made over the transverse carpal ligament.  This was carried into subcutaneous tissues by spreading technique.  The palmar fascia was sharply incised.  Bipolar electrocautery was used to obtain hemostasis.  Transverse carpal ligament was sharply incised.  It was incised distally first.  Care was taken to ensure complete decompression distally.  It was incised proximally.  Scissors were used to split the distal aspect of the volar antebrachial fascia.  A finger was placed into the wound to ensure complete decompression, which was the case.  The nerve was inspected. It was flat and hyperemic.  The motor branch was identified and was intact.  The wound was copiously irrigated with sterile saline.  It was closed with 4-0 nylon in a horizontal mattress fashion.  It was injected with 10 mL of 0.25% plain Marcaine to aid in postoperative analgesia. It was then dressed with sterile Xeroform, 4x4s, and ABD and wrapped with Kerlix and Ace bandage.    Tourniquet deflated at 14 minutes. Fingertips were pink with brisk capillary refill after deflation of tourniquet.  Operative drapes were broken down.  The patient was awoken from anesthesia safely.  She was transferred back to stretcher and taken to PACU in stable condition.  I will see her back in the office in 1 week for postoperative followup.  I will give her Norco 5/325, 1-2 p.o. q.6 hours p.r.n. pain, dispensed #20.     Betha LoaKevin Harmonii Karle, MD   ______________________________ Betha LoaKevin Starnisha Batrez, MD    KK/MEDQ  D:  11/13/2015  T:  11/13/2015  Job:  [id number]

## 2015-11-13 NOTE — Anesthesia Preprocedure Evaluation (Signed)
Anesthesia Evaluation  Patient identified by MRN, date of birth, ID band Patient awake    Reviewed: Allergy & Precautions, NPO status , Patient's Chart, lab work & pertinent test results  Airway Mallampati: II   Neck ROM: full    Dental   Pulmonary neg pulmonary ROS,    breath sounds clear to auscultation       Cardiovascular hypertension,  Rhythm:regular Rate:Normal     Neuro/Psych  Neuromuscular disease    GI/Hepatic PUD, GERD  ,(+) Cirrhosis       , Hepatitis -, C  Endo/Other    Renal/GU      Musculoskeletal  (+) Arthritis ,   Abdominal   Peds  Hematology   Anesthesia Other Findings   Reproductive/Obstetrics                             Anesthesia Physical Anesthesia Plan  ASA: III  Anesthesia Plan: General   Post-op Pain Management:    Induction: Intravenous  Airway Management Planned: LMA  Additional Equipment:   Intra-op Plan:   Post-operative Plan:   Informed Consent: I have reviewed the patients History and Physical, chart, labs and discussed the procedure including the risks, benefits and alternatives for the proposed anesthesia with the patient or authorized representative who has indicated his/her understanding and acceptance.     Plan Discussed with: CRNA, Anesthesiologist and Surgeon  Anesthesia Plan Comments:         Anesthesia Quick Evaluation

## 2015-11-13 NOTE — Anesthesia Procedure Notes (Signed)
Procedure Name: LMA Insertion Date/Time: 11/13/2015 9:44 AM Performed by: Genevieve NorlanderLINKA, Rachel Hudson Pre-anesthesia Checklist: Patient identified, Emergency Drugs available, Suction available and Patient being monitored Patient Re-evaluated:Patient Re-evaluated prior to inductionOxygen Delivery Method: Circle system utilized Preoxygenation: Pre-oxygenation with 100% oxygen Intubation Type: IV induction Ventilation: Mask ventilation without difficulty LMA: LMA inserted LMA Size: 4.0 Number of attempts: 1 Airway Equipment and Method: Bite block Placement Confirmation: positive ETCO2 Tube secured with: Tape Dental Injury: Teeth and Oropharynx as per pre-operative assessment

## 2015-11-13 NOTE — Brief Op Note (Signed)
11/13/2015  10:10 AM  PATIENT:  Rachel CottaFrances M Hudson  55 y.o. female  PRE-OPERATIVE DIAGNOSIS:  RIGHT CARPAL TUNNEL SYNDROME   POST-OPERATIVE DIAGNOSIS:  RIGHT CARPAL TUNNEL SYNDROME  PROCEDURE:  Procedure(s): RIGHT CARPAL TUNNEL RELEASE (Right)  SURGEON:  Surgeon(s) and Role:    * Betha LoaKevin Jeralynn Vaquera, MD - Primary  PHYSICIAN ASSISTANT:   ASSISTANTS: none   ANESTHESIA:   general  EBL:  Total I/O In: 800 [I.V.:800] Out: 1 [Blood:1]  BLOOD ADMINISTERED:none  DRAINS: none   LOCAL MEDICATIONS USED:  MARCAINE     SPECIMEN:  No Specimen  DISPOSITION OF SPECIMEN:  N/A  COUNTS:  YES  TOURNIQUET:   Total Tourniquet Time Documented: Upper Arm (Right) - 14 minutes Total: Upper Arm (Right) - 14 minutes   DICTATION: .Other Dictation: Dictation Number no confirmation number given  PLAN OF CARE: Discharge to home after PACU  PATIENT DISPOSITION:  PACU - hemodynamically stable.

## 2015-11-13 NOTE — Addendum Note (Signed)
Addended by: Dessa PhiFUNCHES, Perris Tripathi on: 11/13/2015 10:52 AM   Modules accepted: Kipp BroodSmartSet

## 2015-11-13 NOTE — Transfer of Care (Signed)
Immediate Anesthesia Transfer of Care Note  Patient: Rachel Hudson  Procedure(s) Performed: Procedure(s): RIGHT CARPAL TUNNEL RELEASE (Right)  Patient Location: PACU  Anesthesia Type:General  Level of Consciousness: awake  Airway & Oxygen Therapy: Patient Spontanous Breathing and Patient connected to face mask oxygen  Post-op Assessment: Report given to RN and Post -op Vital signs reviewed and stable  Post vital signs: Reviewed and stable  Last Vitals:  Filed Vitals:   11/13/15 0822  BP: 128/85  Pulse: 56  Temp: 36.7 C  Resp: 18    Last Pain:  Filed Vitals:   11/13/15 0825  PainSc: 10-Worst pain ever      Patients Stated Pain Goal: 5 (11/13/15 16100822)  Complications: No apparent anesthesia complications

## 2015-11-13 NOTE — Assessment & Plan Note (Signed)
Ascus pap repeat in 3 years

## 2015-11-13 NOTE — Discharge Instructions (Addendum)

## 2015-11-14 ENCOUNTER — Encounter (HOSPITAL_BASED_OUTPATIENT_CLINIC_OR_DEPARTMENT_OTHER): Payer: Self-pay | Admitting: Orthopedic Surgery

## 2015-11-17 MED FILL — GABAPENTIN 800 MG TABLET: 800 | 30 days supply | Qty: 135 | Fill #1

## 2015-11-18 NOTE — Telephone Encounter (Signed)
Patient identity verified per HIPAA guidelines. Patient here today for test results.  States she received call on 11/11/15.  RN advised patient HPV negative ASCUS pap, this means that there are some atypical cells on the cervix, there is no sign of cancer cells. Negative HPV is good and means there is a chance that the ASCUS will change back to normal over time.  Plan is repeat pap in 3 years.  Negative wet prep. Patient verbalized understanding. Pollyann KennedyKim Scherry Laverne, RN, BSN

## 2015-12-01 MED FILL — ?AMLODIPINE BESYLATE 10 MG: 10 | 30 days supply | Qty: 30 | Fill #1

## 2015-12-16 ENCOUNTER — Encounter: Payer: Self-pay | Admitting: Physical Medicine & Rehabilitation

## 2015-12-16 ENCOUNTER — Encounter: Payer: Self-pay | Attending: Physical Medicine & Rehabilitation

## 2015-12-16 ENCOUNTER — Ambulatory Visit (HOSPITAL_BASED_OUTPATIENT_CLINIC_OR_DEPARTMENT_OTHER): Payer: Self-pay | Admitting: Physical Medicine & Rehabilitation

## 2015-12-16 VITALS — BP 132/84 | HR 48 | Resp 17

## 2015-12-16 DIAGNOSIS — M47816 Spondylosis without myelopathy or radiculopathy, lumbar region: Secondary | ICD-10-CM

## 2015-12-16 DIAGNOSIS — M545 Low back pain: Secondary | ICD-10-CM | POA: Insufficient documentation

## 2015-12-16 NOTE — Patient Instructions (Signed)

## 2015-12-16 NOTE — Progress Notes (Signed)

## 2015-12-16 NOTE — Progress Notes (Signed)
  PROCEDURE RECORD [facility]   Name: Rachel Hudson DOB:Nov 13, 1960 MRN: [id number]  Date:12/16/2015  Physician: Claudette Laws, MD    Nurse/CMA: Kathrine Haddock, CMA  Allergies:  Allergies  Allergen Reactions  . Penicillins     Facial swelling Has patient had a PCN reaction causing immediate rash, facial/tongue/throat swelling, SOB or lightheadedness with hypotension: Yes Has patient had a PCN reaction causing severe rash involving mucus membranes or skin necrosis: No Has patient had a PCN reaction that required hospitalization No Has patient had a PCN reaction occurring within the last 10 years: Yes If all of the above answers are "NO", then may proceed with Cephalosporin use.   . Tylenol [Acetaminophen] Other (See Comments)    HX of Hep. C    Consent Signed: Yes.    Is patient diabetic? No.  CBG today? N/A  Pregnant: No. LMP: No LMP recorded. (age 3-55)  Anticoagulants: no Anti-inflammatory: no Antibiotics: no  Procedure: Bilateral Medial Branch Block  Position: Prone Start Time: 1318 End Time: 1329 Fluoro Time: 37  RN/CMA Harshith Pursell, CMA Donnell Wion ,CMA    Time 1305 1333    BP 132/84 146/88    Pulse 48 50    Respirations 17 17    O2 Sat 98 98    S/S 6 6    Pain Level 8/10 5/10     D/C home with [facility], patient A & O X 3, D/C instructions reviewed, and sits independently.

## 2015-12-19 ENCOUNTER — Ambulatory Visit: Payer: No Typology Code available for payment source | Admitting: Nurse Practitioner

## 2015-12-22 MED FILL — GABAPENTIN 800 MG TABLET: 800 | 30 days supply | Qty: 135 | Fill #2

## 2015-12-30 ENCOUNTER — Other Ambulatory Visit: Payer: Self-pay | Admitting: Orthopedic Surgery

## 2015-12-31 MED FILL — ?AMLODIPINE BESYLATE 10 MG: 10 | 30 days supply | Qty: 30 | Fill #2

## 2016-01-15 ENCOUNTER — Encounter: Payer: Self-pay | Admitting: Nurse Practitioner

## 2016-01-15 ENCOUNTER — Other Ambulatory Visit: Payer: Self-pay

## 2016-01-15 ENCOUNTER — Ambulatory Visit (INDEPENDENT_AMBULATORY_CARE_PROVIDER_SITE_OTHER): Payer: Self-pay | Admitting: Nurse Practitioner

## 2016-01-15 VITALS — BP 107/69 | HR 60 | Temp 98.1°F | Ht 61.0 in | Wt 153.2 lb

## 2016-01-15 DIAGNOSIS — K7460 Unspecified cirrhosis of liver: Secondary | ICD-10-CM

## 2016-01-15 NOTE — Assessment & Plan Note (Signed)
Seems to be doing well, some mild soreness right upper quadrant and left lower quadrant which she describes as annoying but not overtly painful. Generally asymptomatic from a hepatic standpoint. At this point we will update her labs including CBC, CMP, PTT/INR, AFP. We will also update her imaging with an abdominal ultrasound. Recommend she continue to avoid alcohol and high risk behaviors for reinfection of hepatitis C. Return for follow-up in 6 months for routine cirrhosis care.

## 2016-01-15 NOTE — Progress Notes (Signed)
Referring Provider: Ambrose FinlandKeck, Valerie A, NP Primary Care Physician:  Lora PaulaFUNCHES, JOSALYN C, MD Primary GI:  Dr. Jena Gaussourk  Chief Complaint  Patient presents with  . Follow-up    doing ok    HPI:   Rachel Hudson is a 55 y.o. female who presents for follow-up on cirrhosis and colonoscopy. She was last seen in our office 06/24/2015 at which point she stated she feels great overall and was generally asymptomatic from a GI and hepatic standpoint. Labs are ordered with a MELD score of 9, Child-Pugh A. abdominal ultrasound completed 10/24/2015 found cirrhotic changes without suspicious masses or lesions. She was also scheduled for first-ever screening colonoscopy and endoscopy for variceal surveillance which were both completed on 07/08/2015.   EGD found no esophageal varices, erosive reflux esophagitis, hiatal hernia, gastric erosions and ulcer status post biopsy. Recommended Protonix 40 mg twice daily. Biopsy found to be reactive gastropathy without H. pylori. Recommended repeat screening EGD in 2 years. Colonoscopy found internal hemorrhoids, colonic left-sided diverticulosis, otherwise normal exam. Recommend repeat colonoscopy in 10 years.   Today she states she's had some LLQ abdominal pain and some RUQ pain. Pain is soreness, annoying. Not constipated. Denies hematochezia, melena, fever, chills, darkend urine, yellowing of skin/eyes, tremors, acute confusion. Denies abdominal swelling, lower extremity edema. Denies chest pain, dyspnea, dizziness, lightheadedness, syncope, near syncope. Denies any other upper or lower GI symptoms.  Past Medical History:  Diagnosis Date  . 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 GERD (gastroesophageal reflux disease)   . Hep C w/o coma, chronic (HCC) As of 10/22/13  . Hypertension     Past Surgical History:  Procedure Laterality Date  . CARPAL TUNNEL RELEASE Right 11/13/2015   Procedure: RIGHT CARPAL TUNNEL RELEASE;  Surgeon: Betha LoaKevin Kuzma, MD;   Location: [facility];  Service: Orthopedics;  Laterality: Right;  . COLONOSCOPY N/A 07/08/2015   Procedure: COLONOSCOPY;  Surgeon: Corbin Adeobert M Rourk, MD;  [facility];  Service: Endoscopy;  Laterality: N/A;  230  . ESOPHAGOGASTRODUODENOSCOPY N/A 07/08/2015   Procedure: ESOPHAGOGASTRODUODENOSCOPY (EGD);  Surgeon: Corbin Adeobert M Rourk, MD;  Location: [facility];  Service: Endoscopy;  Laterality: N/A;  . LIGAMENT REPAIR Right 1980   Knee    Current Outpatient Prescriptions  Medication Sig Dispense Refill  . amLODipine (NORVASC) 10 MG tablet Take 1 tablet (10 mg total) by mouth daily. 30 tablet 5  . gabapentin (NEURONTIN) 800 MG tablet Take 1.5 tablets (1,200 mg total) by mouth 3 (three) times daily. 135 tablet 11  . Multiple Vitamin (MULTIVITAMIN WITH MINERALS) TABS tablet Take 1 tablet by mouth daily.    . nortriptyline (PAMELOR) 25 MG capsule Take 1 capsule (25 mg total) by mouth at bedtime. (Patient taking differently: Take 25 mg by mouth daily as needed. ) 30 capsule 5  . hydrocodone-ibuprofen (VICOPROFEN) 5-200 MG tablet 1-2 tabs po q6 hours prn pain (Patient not taking: Reported on 01/15/2016) 20 tablet 0   No current facility-administered medications for this visit.     Allergies as of 01/15/2016 - Review Complete 01/15/2016  Allergen Reaction Noted  . Penicillins  03/28/2011  . Tylenol [acetaminophen] Other (See Comments) 07/14/2014    Family History  Problem Relation Age of Onset  . Adopted: Yes  . Family history unknown: Yes    Social History   Social History  . Marital status: Single    Spouse name: N/A  . Number of children: N/A  . Years  of education: N/A   Social History Main Topics  . Smoking status: Never Smoker  . Smokeless tobacco: Never Used     Comment: smokes marijuana daily  . Alcohol use No     Comment: 2 pints per week/stopped 01/10/14  . Drug use:     Frequency: 7.0 times per week    Types: Marijuana     Comment: used daily.   Marland Kitchen  Sexual activity: Not Currently    Birth control/ protection: None   Other Topics Concern  . None   Social History Narrative  . None    Review of Systems: General: Negative for anorexia, weight loss, fever, chills, fatigue, weakness. ENT: Negative for hoarseness, difficulty swallowing. CV: Negative for chest pain, angina, palpitations, peripheral edema.  Respiratory: Negative for dyspnea at rest, cough, sputum, wheezing.  GI: See history of present illness. Endo: Negative for unusual weight change.  Heme: Negative for bruising or bleeding.  Physical Exam: BP 107/69   Pulse 60   Temp 98.1 F (36.7 C) (Oral)   Ht 5\' 1"  (1.549 m)   Wt 153 lb 3.2 oz (69.5 kg)   LMP 10/19/2015 (Approximate)   BMI 28.95 kg/m  General:   Alert and oriented. Pleasant and cooperative. Well-nourished and well-developed.  Eyes:  Without icterus, sclera clear and conjunctiva pink.  Ears:  Normal auditory acuity. Cardiovascular:  S1, S2 present without murmurs appreciated. Extremities without clubbing or edema. Respiratory:  Clear to auscultation bilaterally. No wheezes, rales, or rhonchi. No distress.  Gastrointestinal:  +BS, soft, non-tender to palpation and non-distended. No HSM noted. No guarding or rebound. No masses appreciated. No asterixis. Rectal:  Deferred  Neurologic:  Alert and oriented x4;  grossly normal neurologically. Psych:  Alert and cooperative. Normal mood and affect. Heme/Lymph/Immune: No excessive bruising noted.    01/15/2016 10:41 AM   Disclaimer: This note was dictated with voice recognition software. Similar sounding words can inadvertently be transcribed and may not be corrected upon review.

## 2016-01-15 NOTE — Patient Instructions (Signed)
1. Continue to abstain from alcohol 2. Have your labs done when you're able to 3. We will help schedule your ultrasound 4. Return for follow-up 6 months.

## 2016-01-19 MED FILL — GABAPENTIN 800 MG TABLET: 800 | 30 days supply | Qty: 135 | Fill #3

## 2016-01-19 NOTE — Progress Notes (Signed)
CC'ED TO PCP 

## 2016-01-22 ENCOUNTER — Encounter (HOSPITAL_BASED_OUTPATIENT_CLINIC_OR_DEPARTMENT_OTHER): Payer: Self-pay | Admitting: *Deleted

## 2016-01-23 ENCOUNTER — Ambulatory Visit (HOSPITAL_COMMUNITY): Admission: RE | Admit: 2016-01-23 | Payer: Self-pay | Source: Ambulatory Visit

## 2016-01-28 ENCOUNTER — Ambulatory Visit: Payer: Self-pay

## 2016-01-28 ENCOUNTER — Telehealth: Payer: Self-pay

## 2016-01-28 LAB — COMPREHENSIVE METABOLIC PANEL
ALBUMIN: 4.2 g/dL (ref 3.6–5.1)
ALT: 12 U/L (ref 6–29)
AST: 16 U/L (ref 10–35)
Alkaline Phosphatase: 48 U/L (ref 33–130)
BUN: 15 mg/dL (ref 7–25)
CALCIUM: 9.2 mg/dL (ref 8.6–10.4)
CHLORIDE: 106 mmol/L (ref 98–110)
CO2: 27 mmol/L (ref 20–31)
Creat: 0.75 mg/dL (ref 0.50–1.05)
GLUCOSE: 86 mg/dL (ref 65–99)
Potassium: 4.1 mmol/L (ref 3.5–5.3)
Sodium: 140 mmol/L (ref 135–146)
Total Bilirubin: 0.3 mg/dL (ref 0.2–1.2)
Total Protein: 7.3 g/dL (ref 6.1–8.1)

## 2016-01-28 LAB — CBC WITH DIFFERENTIAL/PLATELET
BASOS ABS: 75 {cells}/uL (ref 0–200)
Basophils Relative: 1 %
EOS PCT: 4 %
Eosinophils Absolute: 300 cells/uL (ref 15–500)
HEMATOCRIT: 38.5 % (ref 35.0–45.0)
HEMOGLOBIN: 13.3 g/dL (ref 11.7–15.5)
LYMPHS ABS: 3375 {cells}/uL (ref 850–3900)
LYMPHS PCT: 45 %
MCH: 30.4 pg (ref 27.0–33.0)
MCHC: 34.5 g/dL (ref 32.0–36.0)
MCV: 87.9 fL (ref 80.0–100.0)
MONO ABS: 525 {cells}/uL (ref 200–950)
MPV: 11.2 fL (ref 7.5–12.5)
Monocytes Relative: 7 %
NEUTROS PCT: 43 %
Neutro Abs: 3225 cells/uL (ref 1500–7800)
Platelets: 141 10*3/uL (ref 140–400)
RBC: 4.38 MIL/uL (ref 3.80–5.10)
RDW: 13.5 % (ref 11.0–15.0)
WBC: 7.5 10*3/uL (ref 3.8–10.8)

## 2016-01-28 LAB — PROTIME-INR
INR: 1
Prothrombin Time: 11.1 s (ref 9.0–11.5)

## 2016-01-28 LAB — AFP TUMOR MARKER: AFP TUMOR MARKER: 3.2 ng/mL (ref <6.1)

## 2016-01-28 NOTE — Telephone Encounter (Signed)
[facility] [place] [facility] PT result of 11.1 to Wynne DustEric Gill, NP.

## 2016-01-29 ENCOUNTER — Encounter (HOSPITAL_BASED_OUTPATIENT_CLINIC_OR_DEPARTMENT_OTHER): Payer: Self-pay | Admitting: Anesthesiology

## 2016-01-29 ENCOUNTER — Ambulatory Visit (HOSPITAL_BASED_OUTPATIENT_CLINIC_OR_DEPARTMENT_OTHER): Payer: Self-pay | Admitting: Anesthesiology

## 2016-01-29 ENCOUNTER — Ambulatory Visit (HOSPITAL_BASED_OUTPATIENT_CLINIC_OR_DEPARTMENT_OTHER)
Admission: RE | Admit: 2016-01-29 | Discharge: 2016-01-29 | Disposition: A | Discharge status: Home / Self Care | Payer: Self-pay | Source: Ambulatory Visit | Attending: Orthopedic Surgery | Admitting: Orthopedic Surgery

## 2016-01-29 ENCOUNTER — Encounter (HOSPITAL_BASED_OUTPATIENT_CLINIC_OR_DEPARTMENT_OTHER)
Admission: RE | Disposition: A | Discharge status: Home / Self Care | Payer: Self-pay | Source: Ambulatory Visit | Attending: Orthopedic Surgery

## 2016-01-29 DIAGNOSIS — G5602 Carpal tunnel syndrome, left upper limb: Secondary | ICD-10-CM | POA: Insufficient documentation

## 2016-01-29 DIAGNOSIS — I10 Essential (primary) hypertension: Secondary | ICD-10-CM | POA: Insufficient documentation

## 2016-01-29 DIAGNOSIS — Z880 Allergy status to penicillin: Secondary | ICD-10-CM | POA: Insufficient documentation

## 2016-01-29 DIAGNOSIS — F1290 Cannabis use, unspecified, uncomplicated: Secondary | ICD-10-CM | POA: Insufficient documentation

## 2016-01-29 DIAGNOSIS — M1990 Unspecified osteoarthritis, unspecified site: Secondary | ICD-10-CM | POA: Insufficient documentation

## 2016-01-29 DIAGNOSIS — B182 Chronic viral hepatitis C: Secondary | ICD-10-CM | POA: Insufficient documentation

## 2016-01-29 DIAGNOSIS — K219 Gastro-esophageal reflux disease without esophagitis: Secondary | ICD-10-CM | POA: Insufficient documentation

## 2016-01-29 DIAGNOSIS — K7460 Unspecified cirrhosis of liver: Secondary | ICD-10-CM | POA: Insufficient documentation

## 2016-01-29 HISTORY — PX: CARPAL TUNNEL RELEASE: SHX101

## 2016-01-29 SURGERY — CARPAL TUNNEL RELEASE
Anesthesia: General | Site: Wrist | Laterality: Left

## 2016-01-29 MED ORDER — FENTANYL CITRATE (PF) 100 MCG/2ML IJ SOLN: 50.0000 ug | INTRAMUSCULAR | Status: DC | PRN

## 2016-01-29 MED ORDER — MIDAZOLAM HCL 2 MG/2ML IJ SOLN: 1.0000 mg | INTRAMUSCULAR | Status: DC | PRN

## 2016-01-29 MED ORDER — DEXAMETHASONE SODIUM PHOSPHATE 10 MG/ML IJ SOLN
INTRAMUSCULAR | Status: AC
  Filled 2016-01-29: qty 1

## 2016-01-29 MED ORDER — DEXAMETHASONE SODIUM PHOSPHATE 4 MG/ML IJ SOLN
INTRAMUSCULAR | Status: DC | PRN
  Administered 2016-01-29: 10 mg via INTRAVENOUS

## 2016-01-29 MED ORDER — LIDOCAINE 2% (20 MG/ML) 5 ML SYRINGE
INTRAMUSCULAR | Status: AC
  Filled 2016-01-29: qty 5

## 2016-01-29 MED ORDER — GLYCOPYRROLATE 0.2 MG/ML IJ SOLN
0.2000 mg | Freq: Once | INTRAMUSCULAR | Status: AC | PRN
  Administered 2016-01-29: 0.2 mg via INTRAVENOUS

## 2016-01-29 MED ORDER — OXYCODONE HCL 5 MG PO TABS: ORAL_TABLET | ORAL | 0 refills | Status: DC

## 2016-01-29 MED ORDER — BUPIVACAINE HCL (PF) 0.25 % IJ SOLN
INTRAMUSCULAR | Status: DC | PRN
  Administered 2016-01-29: 8 mL

## 2016-01-29 MED ORDER — VANCOMYCIN HCL IN DEXTROSE 1-5 GM/200ML-% IV SOLN
1000.0000 mg | INTRAVENOUS | Status: AC
  Administered 2016-01-29: 1000 mg via INTRAVENOUS

## 2016-01-29 MED ORDER — VANCOMYCIN HCL IN DEXTROSE 1-5 GM/200ML-% IV SOLN
INTRAVENOUS | Status: AC
  Filled 2016-01-29: qty 200

## 2016-01-29 MED ORDER — MIDAZOLAM HCL 5 MG/5ML IJ SOLN
INTRAMUSCULAR | Status: DC | PRN
  Administered 2016-01-29: 2 mg via INTRAVENOUS

## 2016-01-29 MED ORDER — ONDANSETRON HCL 4 MG/2ML IJ SOLN
INTRAMUSCULAR | Status: DC | PRN
  Administered 2016-01-29: 4 mg via INTRAVENOUS

## 2016-01-29 MED ORDER — LACTATED RINGERS IV SOLN
INTRAVENOUS | Status: DC
  Administered 2016-01-29: 09:00:00 via INTRAVENOUS

## 2016-01-29 MED ORDER — LIDOCAINE HCL (CARDIAC) 20 MG/ML IV SOLN
INTRAVENOUS | Status: DC | PRN
  Administered 2016-01-29: 30 mg via INTRAVENOUS

## 2016-01-29 MED ORDER — ONDANSETRON HCL 4 MG/2ML IJ SOLN
INTRAMUSCULAR | Status: AC
  Filled 2016-01-29: qty 2

## 2016-01-29 MED ORDER — CHLORHEXIDINE GLUCONATE 4 % EX LIQD: 60.0000 mL | Freq: Once | CUTANEOUS | Status: DC

## 2016-01-29 MED ORDER — PROPOFOL 10 MG/ML IV BOLUS
INTRAVENOUS | Status: DC | PRN
  Administered 2016-01-29: 125 mg via INTRAVENOUS

## 2016-01-29 MED ORDER — MIDAZOLAM HCL 2 MG/2ML IJ SOLN
INTRAMUSCULAR | Status: AC
  Filled 2016-01-29: qty 2

## 2016-01-29 MED ORDER — FENTANYL CITRATE (PF) 100 MCG/2ML IJ SOLN: 25.0000 ug | INTRAMUSCULAR | Status: DC | PRN

## 2016-01-29 MED ORDER — SCOPOLAMINE 1 MG/3DAYS TD PT72: 1.0000 | MEDICATED_PATCH | Freq: Once | TRANSDERMAL | Status: DC | PRN

## 2016-01-29 MED ORDER — FENTANYL CITRATE (PF) 100 MCG/2ML IJ SOLN
INTRAMUSCULAR | Status: DC | PRN
  Administered 2016-01-29: 25 ug via INTRAVENOUS
  Administered 2016-01-29: 100 ug via INTRAVENOUS

## 2016-01-29 MED ORDER — FENTANYL CITRATE (PF) 100 MCG/2ML IJ SOLN
INTRAMUSCULAR | Status: AC
  Filled 2016-01-29: qty 2

## 2016-01-29 MED ORDER — EPHEDRINE SULFATE 50 MG/ML IJ SOLN
INTRAMUSCULAR | Status: DC | PRN
  Administered 2016-01-29: 10 mg via INTRAVENOUS

## 2016-01-29 SURGICAL SUPPLY — 32 items
BANDAGE ACE 3X5.8 VEL STRL LF (GAUZE/BANDAGES/DRESSINGS) ×3 IMPLANT
BLADE SURG 15 STRL LF DISP TIS (BLADE) ×2 IMPLANT
BLADE SURG 15 STRL SS (BLADE) ×4
BNDG ESMARK 4X9 LF (GAUZE/BANDAGES/DRESSINGS) IMPLANT
BNDG GAUZE ELAST 4 BULKY (GAUZE/BANDAGES/DRESSINGS) ×3 IMPLANT
CHLORAPREP W/TINT 26ML (MISCELLANEOUS) ×3 IMPLANT
CORDS BIPOLAR (ELECTRODE) ×3 IMPLANT
COVER BACK TABLE 60X90IN (DRAPES) ×3 IMPLANT
COVER MAYO STAND STRL (DRAPES) ×3 IMPLANT
CUFF TOURNIQUET SINGLE 18IN (TOURNIQUET CUFF) ×3 IMPLANT
DRAPE EXTREMITY T 121X128X90 (DRAPE) ×3 IMPLANT
DRAPE SURG 17X23 STRL (DRAPES) ×3 IMPLANT
DRSG PAD ABDOMINAL 8X10 ST (GAUZE/BANDAGES/DRESSINGS) ×3 IMPLANT
GAUZE SPONGE 4X4 12PLY STRL (GAUZE/BANDAGES/DRESSINGS) ×3 IMPLANT
GAUZE XEROFORM 1X8 LF (GAUZE/BANDAGES/DRESSINGS) ×3 IMPLANT
GLOVE BIO SURGEON STRL SZ7.5 (GLOVE) ×3 IMPLANT
GLOVE BIOGEL PI IND STRL 8 (GLOVE) ×1 IMPLANT
GLOVE BIOGEL PI INDICATOR 8 (GLOVE) ×2
GOWN STRL REUS W/ TWL LRG LVL3 (GOWN DISPOSABLE) ×1 IMPLANT
GOWN STRL REUS W/TWL LRG LVL3 (GOWN DISPOSABLE) ×2
GOWN STRL REUS W/TWL XL LVL3 (GOWN DISPOSABLE) ×3 IMPLANT
NEEDLE HYPO 25X1 1.5 SAFETY (NEEDLE) IMPLANT
NS IRRIG 1000ML POUR BTL (IV SOLUTION) ×3 IMPLANT
PACK BASIN DAY SURGERY FS (CUSTOM PROCEDURE TRAY) ×3 IMPLANT
PADDING CAST ABS 4INX4YD NS (CAST SUPPLIES) ×2
PADDING CAST ABS COTTON 4X4 ST (CAST SUPPLIES) ×1 IMPLANT
STOCKINETTE 4X48 STRL (DRAPES) ×3 IMPLANT
SUT ETHILON 4 0 PS 2 18 (SUTURE) ×3 IMPLANT
SYR BULB 3OZ (MISCELLANEOUS) ×3 IMPLANT
SYR CONTROL 10ML LL (SYRINGE) IMPLANT
TOWEL OR 17X24 6PK STRL BLUE (TOWEL DISPOSABLE) ×3 IMPLANT
UNDERPAD 30X30 (UNDERPADS AND DIAPERS) ×3 IMPLANT

## 2016-01-29 NOTE — Discharge Instructions (Addendum)

## 2016-01-29 NOTE — Anesthesia Postprocedure Evaluation (Signed)
Anesthesia Post Note  Patient: Craige CottaFrances M Stitt  Procedure(s) Performed: Procedure(s) (LRB): left CARPAL TUNNEL RELEASE (Left)  Patient location during evaluation: PACU Anesthesia Type: General Level of consciousness: awake and alert Pain management: pain level controlled Vital Signs Assessment: post-procedure vital signs reviewed and stable Respiratory status: spontaneous breathing, nonlabored ventilation and respiratory function stable Cardiovascular status: blood pressure returned to baseline and stable Postop Assessment: no signs of nausea or vomiting Anesthetic complications: no    Last Vitals:  Vitals:   01/29/16 1102 01/29/16 1115  BP: 121/81 113/82  Pulse: 87 79  Resp: 16 18  Temp: 36.5 C     Last Pain:  Vitals:   01/29/16 1115  TempSrc:   PainSc: 0-No pain                 Korbin Notaro,W. EDMOND

## 2016-01-29 NOTE — Anesthesia Procedure Notes (Signed)
Procedure Name: LMA Insertion Date/Time: 01/29/2016 10:32 AM Performed by: Genevieve NorlanderLINKA, Rachel Hudson Pre-anesthesia Checklist: Patient identified, Emergency Drugs available, Suction available, Patient being monitored and Timeout performed Patient Re-evaluated:Patient Re-evaluated prior to inductionOxygen Delivery Method: Circle system utilized Preoxygenation: Pre-oxygenation with 100% oxygen Intubation Type: IV induction Ventilation: Mask ventilation without difficulty LMA: LMA inserted LMA Size: 4.0 Number of attempts: 1 Airway Equipment and Method: Bite block Placement Confirmation: positive ETCO2 Tube secured with: Tape Dental Injury: Teeth and Oropharynx as per pre-operative assessment

## 2016-01-29 NOTE — Transfer of Care (Signed)
Immediate Anesthesia Transfer of Care Note  Patient: Rachel Hudson  Procedure(s) Performed: Procedure(s): left CARPAL TUNNEL RELEASE (Left)  Patient Location: PACU  Anesthesia Type:General  Level of Consciousness: awake and patient cooperative  Airway & Oxygen Therapy: Patient Spontanous Breathing and Patient connected to face mask oxygen  Post-op Assessment: Report given to RN and Post -op Vital signs reviewed and stable  Post vital signs: Reviewed and stable  Last Vitals:  Vitals:   01/29/16 0906  BP: 114/77  Pulse: (!) 55  Resp: 18  Temp: 36.9 C    Last Pain:  Vitals:   01/29/16 0906  TempSrc: Oral  PainSc: 2          Complications: No apparent anesthesia complications

## 2016-01-29 NOTE — Progress Notes (Signed)
Please tell the patient her labs look good! INR 1.0 (normal clotting enzymes being made by the liver), LFTs all normal (normal liver function), normal kidney functions. She is mildly anemic but within her baseline. However, if she continues to have anemia we may need to dig further. Keep follow-up appointment.   MELD: 6 Child-Pugh: A

## 2016-01-29 NOTE — Brief Op Note (Signed)
01/29/2016  10:57 AM  PATIENT:  Rachel CottaFrances M Hudson  55 y.o. female  PRE-OPERATIVE DIAGNOSIS:  Left carpal tunnel syndrome  POST-OPERATIVE DIAGNOSIS:  Left carpal tunnel syndrome  PROCEDURE:  Procedure(s): left CARPAL TUNNEL RELEASE (Left)  SURGEON:  Surgeon(s) and Role:    * Betha LoaKevin Colene Mines, MD - Primary  PHYSICIAN ASSISTANT:   ASSISTANTS: none   ANESTHESIA:   general  EBL:  Total I/O In: -  Out: 2 [Blood:2]  BLOOD ADMINISTERED:none  DRAINS: none   LOCAL MEDICATIONS USED:  MARCAINE     SPECIMEN:  No Specimen  DISPOSITION OF SPECIMEN:  N/A  COUNTS:  YES  TOURNIQUET:   Total Tourniquet Time Documented: Upper Arm (Left) - 11 minutes Total: Upper Arm (Left) - 11 minutes   DICTATION: .Note written in EPIC  PLAN OF CARE: Discharge to home after PACU  PATIENT DISPOSITION:  PACU - hemodynamically stable.

## 2016-01-29 NOTE — Op Note (Signed)
01/29/2016 [facility]                              OPERATIVE REPORT   PREOPERATIVE DIAGNOSIS:  Left carpal tunnel syndrome.  POSTOPERATIVE DIAGNOSIS:  Left carpal tunnel syndrome.  PROCEDURE:  Left carpal tunnel release.  SURGEON:  Betha Loa, MD  ASSISTANT:  none.  ANESTHESIA:  General.  IV FLUIDS:  Per anesthesia flow sheet.  ESTIMATED BLOOD LOSS:  Minimal.  COMPLICATIONS:  None.  SPECIMENS:  None.  TOURNIQUET TIME:    Total Tourniquet Time Documented: Upper Arm (Left) - 11 minutes Total: Upper Arm (Left) - 11 minutes   DISPOSITION:  Stable to PACU.  LOCATION: [facility]  INDICATIONS:  55 yo female with carpal tunnel syndrome left hand.  She has previously had right carpal tunnel release.  She is bothered by the symptoms in her left hand.  She wishes to have a carpal tunnel release for management of her symptoms.  Risks, benefits and alternatives of surgery were discussed including the risk of blood loss; infection; damage to nerves, vessels, tendons, ligaments, bone; failure of surgery; need for additional surgery; complications with wound healing; continued pain; recurrence of carpal tunnel syndrome; and damage to motor branch. She voiced understanding of these risks and elected to proceed.   OPERATIVE COURSE:  After being identified preoperatively by myself, the patient and I agreed upon the procedure and site of procedure.  The surgical site was marked.  The risks, benefits, and alternatives of the surgery were reviewed and she wished to proceed.  Surgical consent had been signed.  She was given IV vancomycin as preoperative antibiotic prophylaxis.  She was transferred to the operating room and placed on the operating room table in supine position with the Left upper extremity on an armboard.  General was induced by Anesthesiology.  Left upper extremity was prepped and draped in normal sterile orthopaedic fashion.  A surgical pause was  performed between the surgeons, anesthesia, and operating room staff, and all were in agreement as to the patient, procedure, and site of procedure.  Tourniquet at the proximal aspect of the arm was inflated to 250 mmHg after exsanguination of the arm with and Esmarch bandage.  Incision was made over the transverse carpal ligament and carried into the subcutaneous tissues by spreading technique.  Bipolar electrocautery was used to obtain hemostasis.  The palmar fascia was sharply incised.  The transverse carpal ligament was identified and sharply incised.  It was incised distally first.  Care was taken to ensure complete decompression distally.  It was then incised proximally.  Scissors were used to split the distal aspect of the volar antebrachial fascia.  A finger was placed into the wound to ensure complete decompression, which was the case.  The nerve was examined.  It was flattened.  The motor branch was identified and was intact.  The wound was copiously irrigated with sterile saline.  It was then closed with 4-0 nylon in a horizontal mattress fashion.  It was injected with 8 mL of 0.25% plain Marcaine to aid in postoperative analgesia.  It was dressed with sterile Xeroform, 4x4s, an ABD, and wrapped with Kerlix and an Ace bandage.  Tourniquet was deflated at 11 minutes.  Fingertips were pink with brisk capillary refill after deflation of the tourniquet.  Operative drapes were broken down.  The patient was awoken from anesthesia safely.  She was transferred back to  stretcher and taken to the PACU in stable condition.  I will see her back in the office in 1 week for postoperative followup.  I will give her a prescription for oxycodone 5 mg. q6h prn pain, dispense #20.    Betha LoaKevin Rea Kalama, MD

## 2016-01-29 NOTE — H&P (Signed)
Rachel Hudson is an 55 y.o. female.   Chief Complaint: left carpal tunnel syndrome HPI: 55 yo female with left carpal tunnel syndrome.  Positive nerve conduction studies.  She is bothered by this.  She wishes to have a carpal tunnel release for management of her symptoms.  Allergies:  Allergies  Allergen Reactions  . Penicillins     Facial swelling Has patient had a PCN reaction causing immediate rash, facial/tongue/throat swelling, SOB or lightheadedness with hypotension: Yes Has patient had a PCN reaction causing severe rash involving mucus membranes or skin necrosis: No Has patient had a PCN reaction that required hospitalization No Has patient had a PCN reaction occurring within the last 10 years: Yes If all of the above answers are "NO", then may proceed with Cephalosporin use.   . Tylenol [Acetaminophen] Other (See Comments)    HX of Hep. C    Past Medical History:  Diagnosis Date  . 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 GERD (gastroesophageal reflux disease)   . Hep C w/o coma, chronic ([place]) As of 10/22/13  . Hypertension     Past Surgical History:  Procedure Laterality Date  . CARPAL TUNNEL RELEASE Right 11/13/2015   Procedure: RIGHT CARPAL TUNNEL RELEASE;  Surgeon: Leanora Cover, MD;  Location: [facility];  Service: Orthopedics;  Laterality: Right;  . COLONOSCOPY N/A 07/08/2015   Procedure: COLONOSCOPY;  Surgeon: Daneil Dolin, MD;  [facility];  Service: Endoscopy;  Laterality: N/A;  230  . ESOPHAGOGASTRODUODENOSCOPY N/A 07/08/2015   Procedure: ESOPHAGOGASTRODUODENOSCOPY (EGD);  Surgeon: Daneil Dolin, MD;  [facility];  Service: Endoscopy;  Laterality: N/A;  . LIGAMENT REPAIR Right 1980   Knee    Family History: Family History  Problem Relation Age of Onset  . Adopted: Yes  . Family history unknown: Yes    Social History:   reports that she has never smoked. She has never used smokeless tobacco. She  reports that she does not drink alcohol or use drugs.  Medications: Medications Prior to Admission  Medication Sig Dispense Refill  . amLODipine (NORVASC) 10 MG tablet Take 1 tablet (10 mg total) by mouth daily. 30 tablet 5  . gabapentin (NEURONTIN) 800 MG tablet Take 1.5 tablets (1,200 mg total) by mouth 3 (three) times daily. 135 tablet 11  . Multiple Vitamin (MULTIVITAMIN WITH MINERALS) TABS tablet Take 1 tablet by mouth daily.    . nortriptyline (PAMELOR) 25 MG capsule Take 1 capsule (25 mg total) by mouth at bedtime. (Patient taking differently: Take 25 mg by mouth daily as needed. ) 30 capsule 5    Results for orders placed or performed in visit on 01/15/16 (from the past 48 hour(s))  CBC with Differential/Platelet     Status: None   Collection Time: 01/28/16 10:40 AM  Result Value Ref Range   WBC 7.5 3.8 - 10.8 K/uL   RBC 4.38 3.80 - 5.10 MIL/uL   Hemoglobin 13.3 11.7 - 15.5 g/dL   HCT 38.5 35.0 - 45.0 %   MCV 87.9 80.0 - 100.0 fL   MCH 30.4 27.0 - 33.0 pg   MCHC 34.5 32.0 - 36.0 g/dL   RDW 13.5 11.0 - 15.0 %   Platelets 141 140 - 400 K/uL   MPV 11.2 7.5 - 12.5 fL   Neutro Abs 3,225 1,500 - 7,800 cells/uL   Lymphs Abs 3,375 850 - 3,900 cells/uL   Monocytes Absolute 525 200 - 950  cells/uL   Eosinophils Absolute 300 15 - 500 cells/uL   Basophils Absolute 75 0 - 200 cells/uL   Neutrophils Relative % 43 %   Lymphocytes Relative 45 %   Monocytes Relative 7 %   Eosinophils Relative 4 %   Basophils Relative 1 %   Smear Review Criteria for review not met   Comprehensive metabolic panel     Status: None   Collection Time: 01/28/16 10:40 AM  Result Value Ref Range   Sodium 140 135 - 146 mmol/L   Potassium 4.1 3.5 - 5.3 mmol/L   Chloride 106 98 - 110 mmol/L   CO2 27 20 - 31 mmol/L   Glucose, Bld 86 65 - 99 mg/dL   BUN 15 7 - 25 mg/dL   Creat 0.75 0.50 - 1.05 mg/dL    Comment:   For patients > or = 54 years of age: The upper reference limit for Creatinine is approximately  13% higher for people identified as African-American.      Total Bilirubin 0.3 0.2 - 1.2 mg/dL   Alkaline Phosphatase 48 33 - 130 U/L   AST 16 10 - 35 U/L   ALT 12 6 - 29 U/L   Total Protein 7.3 6.1 - 8.1 g/dL   Albumin 4.2 3.6 - 5.1 g/dL   Calcium 9.2 8.6 - 10.4 mg/dL  INR/PT     Status: None   Collection Time: 01/28/16 10:40 AM  Result Value Ref Range   Prothrombin Time 11.1 9.0 - 11.5 sec    Comment:   For more information on this test, go to: http://education.questdiagn[facility].com/faq/FAQ104      INR 1.0     Comment:   Reference Range                        0.9-1.1 Moderate-intensity Warfarin Therapy    2.0-3.0 Higher-intensity Warfarin Therapy      3.0-4.0     AFP tumor marker     Status: None   Collection Time: 01/28/16 10:40 AM  Result Value Ref Range   AFP-Tumor Marker 3.2 <6.1 ng/mL    Comment: Patients < 69 month old: * Pediatric range is based on full term neonates, values for premature infants may be higher.   Female: The use of AFP as a Tumor Marker in pregnant patients is not recommended.   This test was performed using the Beckman Coulter chemiluminescent method. Values obtained from different assay methods cannot be used interchangeably. AFP levels, regardless of value, should not be interpreted as absolute evidence of the presence or absence of disease.     No results found.   A comprehensive review of systems was negative.  Blood pressure 114/77, pulse (!) 55, temperature 98.4 F (36.9 C), temperature source Oral, resp. rate 18, height 5' 1"  (1.549 m), weight 68.9 kg (152 lb), last menstrual period 10/19/2015, SpO2 100 %.  General appearance: alert, cooperative and appears stated age Head: Normocephalic, without obvious abnormality, atraumatic Neck: supple, symmetrical, trachea midline Resp: clear to auscultation bilaterally Cardio: regular rate and rhythm GI: non-tender Extremities: Intact sensation and capillary refill all digits.   +epl/fpl/io.  No wounds.  Pulses: 2+ and symmetric Skin: Skin color, texture, turgor normal. No rashes or lesions Neurologic: Grossly normal Incision/Wound:none  Assessment/Plan Left carpal tunnel syndrome.  Non operative and operative treatment options were discussed with the patient and patient wishes to proceed with operative treatment. Risks, benefits, and alternatives of surgery were discussed and the patient  agrees with the plan of care.   Gaia Gullikson R 01/29/2016, 9:23 AM

## 2016-01-29 NOTE — Telephone Encounter (Signed)
Noted. Labs reviewed, INR normal.

## 2016-01-29 NOTE — Anesthesia Preprocedure Evaluation (Addendum)
Anesthesia Evaluation  Patient identified by MRN, date of birth, ID band Patient awake    Reviewed: Allergy & Precautions, NPO status , Patient's Chart, lab work & pertinent test results  Airway Mallampati: II  TM Distance: >3 FB Neck ROM: Full    Dental  (+) Teeth Intact, Dental Advisory Given, Caps,    Pulmonary neg pulmonary ROS,    Pulmonary exam normal breath sounds clear to auscultation       Cardiovascular hypertension, Pt. on medications Normal cardiovascular exam Rhythm:Regular Rate:Normal     Neuro/Psych negative neurological ROS  negative psych ROS   GI/Hepatic PUD, GERD  Medicated,(+) Cirrhosis     substance abuse  marijuana use, Hepatitis -, C  Endo/Other  negative endocrine ROS  Renal/GU negative Renal ROS     Musculoskeletal  (+) Arthritis , Osteoarthritis,    Abdominal   Peds  Hematology negative hematology ROS (+)   Anesthesia Other Findings Day of surgery medications reviewed with the patient.  Reproductive/Obstetrics                            Anesthesia Physical Anesthesia Plan  ASA: III  Anesthesia Plan: General   Post-op Pain Management:    Induction: Intravenous  Airway Management Planned: LMA  Additional Equipment:   Intra-op Plan:   Post-operative Plan: Extubation in OR  Informed Consent: I have reviewed the patients History and Physical, chart, labs and discussed the procedure including the risks, benefits and alternatives for the proposed anesthesia with the patient or authorized representative who has indicated his/her understanding and acceptance.   Dental advisory given  Plan Discussed with: CRNA  Anesthesia Plan Comments: (Risks/benefits of general anesthesia discussed with patient including risk of damage to teeth, lips, gum, and tongue, nausea/vomiting, allergic reactions to medications, and the possibility of heart attack, stroke and  death.  All patient questions answered.  Patient wishes to proceed.)      Anesthesia Quick Evaluation

## 2016-01-30 ENCOUNTER — Encounter (HOSPITAL_BASED_OUTPATIENT_CLINIC_OR_DEPARTMENT_OTHER): Payer: Self-pay | Admitting: Orthopedic Surgery

## 2016-02-02 ENCOUNTER — Ambulatory Visit (HOSPITAL_COMMUNITY)
Admission: RE | Admit: 2016-02-02 | Discharge: 2016-02-02 | Disposition: A | Discharge status: Home / Self Care | Payer: Self-pay | Source: Ambulatory Visit | Attending: Nurse Practitioner | Admitting: Nurse Practitioner

## 2016-02-02 DIAGNOSIS — K7689 Other specified diseases of liver: Secondary | ICD-10-CM | POA: Insufficient documentation

## 2016-02-02 DIAGNOSIS — K7460 Unspecified cirrhosis of liver: Secondary | ICD-10-CM | POA: Insufficient documentation

## 2016-02-06 MED FILL — AMLODIPINE BESYLATE 10 MG T: 10 | 30 days supply | Qty: 30 | Fill #3

## 2016-02-18 ENCOUNTER — Ambulatory Visit: Payer: Self-pay | Attending: Family Medicine

## 2016-02-19 ENCOUNTER — Ambulatory Visit: Payer: Self-pay

## 2016-02-25 MED FILL — GABAPENTIN 800 MG TABLET: 800 | 30 days supply | Qty: 135 | Fill #4

## 2016-03-08 MED FILL — ?AMLODIPINE BESYLATE 10 MG: 10 | 30 days supply | Qty: 30 | Fill #4

## 2016-03-10 MED FILL — CELECOXIB 200 MG CAPSULE: 200 | 30 days supply | Qty: 30 | Fill #0

## 2016-03-18 ENCOUNTER — Ambulatory Visit: Payer: Self-pay | Admitting: Physical Medicine & Rehabilitation

## 2016-03-18 ENCOUNTER — Telehealth: Payer: Self-pay | Admitting: Family Medicine

## 2016-03-18 ENCOUNTER — Encounter: Payer: Self-pay | Admitting: Physical Medicine & Rehabilitation

## 2016-03-18 ENCOUNTER — Encounter: Payer: Self-pay | Attending: Physical Medicine & Rehabilitation

## 2016-03-18 VITALS — BP 112/78 | HR 60 | Resp 16

## 2016-03-18 DIAGNOSIS — M4317 Spondylolisthesis, lumbosacral region: Secondary | ICD-10-CM

## 2016-03-18 DIAGNOSIS — M47816 Spondylosis without myelopathy or radiculopathy, lumbar region: Secondary | ICD-10-CM

## 2016-03-18 DIAGNOSIS — G5603 Carpal tunnel syndrome, bilateral upper limbs: Secondary | ICD-10-CM

## 2016-03-18 MED ORDER — GABAPENTIN 800 MG PO TABS: 1200.0000 mg | ORAL_TABLET | Freq: Three times a day (TID) | ORAL | 0 refills | Status: DC

## 2016-03-18 MED ORDER — TRAMADOL HCL 50 MG PO TABS: 50.0000 mg | ORAL_TABLET | Freq: Three times a day (TID) | ORAL | 2 refills | Status: DC | PRN

## 2016-03-18 MED FILL — traMADol HCL 50 MG TABS: 50 | 30 days supply | Qty: 90 | Fill #0

## 2016-03-18 NOTE — Patient Instructions (Signed)

## 2016-03-18 NOTE — Telephone Encounter (Signed)
Patient is needing a refill for gabpentin patient is needing a month supply of medication. Please follow up.

## 2016-03-18 NOTE — Progress Notes (Signed)

## 2016-03-18 NOTE — Progress Notes (Signed)
Ms Rachel Hudson left without taking prescription and AVS. Tramadol called to [facility]

## 2016-03-18 NOTE — Telephone Encounter (Signed)
Gabapentin refilled

## 2016-03-18 NOTE — Progress Notes (Signed)
  PROCEDURE RECORD [facility]   Name: Rachel CottaFrances M Menger DOB:1960/06/01 MRN: [id number]  Date:03/18/2016  Physician: Claudette LawsAndrew Kirsteins, MD    Nurse/CMA: [name] CarasHaynes, Mayes Sangiovanni  Allergies:  Allergies  Allergen Reactions  . Penicillins     Facial swelling Has patient had a PCN reaction causing immediate rash, facial/tongue/throat swelling, SOB or lightheadedness with hypotension: Yes Has patient had a PCN reaction causing severe rash involving mucus membranes or skin necrosis: No Has patient had a PCN reaction that required hospitalization No Has patient had a PCN reaction occurring within the last 10 years: Yes If all of the above answers are "NO", then may proceed with Cephalosporin use.   . Tylenol [Acetaminophen] Other (See Comments)    HX of Hep. C    Consent Signed: Yes.    Is patient diabetic? No.  CBG today?   Pregnant: No. LMP:03/01/16 Peri menopausal (age 55-55)  Anticoagulants: no Anti-inflammatory: no Antibiotics: no  Procedure: Bilateral Medial Branch Block Position: Prone Start Time: 136 End Time: 147  Fluoro Time: 34  RN/CMA Jauna Raczynski     Time 124 151    BP 112/78 130/82    Pulse 60 56    Respirations 16 16    O2 Sat 95 96    S/S 6 6    Pain Level 8 8     D/C home with Bus, patient A & O X 3, D/C instructions reviewed, and sits independently.

## 2016-03-22 MED FILL — GABAPENTIN 800 MG TABLET: 800 | 30 days supply | Qty: 135 | Fill #0

## 2016-03-23 NOTE — Telephone Encounter (Signed)
Patient hipaa verified per guidelines. Rn advised patient per Dr. Armen PickupFunches the: Gabapentin has been refilled.  Patient verbalized understanding

## 2016-03-31 ENCOUNTER — Ambulatory Visit: Payer: Self-pay | Attending: Family Medicine

## 2016-04-09 MED FILL — ?AMLODIPINE BESYLATE 10 MG: 10 | 30 days supply | Qty: 30 | Fill #5

## 2016-04-13 ENCOUNTER — Encounter: Payer: Self-pay | Admitting: Family Medicine

## 2016-04-13 ENCOUNTER — Ambulatory Visit: Payer: Self-pay | Attending: Family Medicine | Admitting: Family Medicine

## 2016-04-13 VITALS — BP 129/83 | HR 55 | Temp 98.0°F | Ht 61.0 in | Wt 161.2 lb

## 2016-04-13 DIAGNOSIS — I10 Essential (primary) hypertension: Secondary | ICD-10-CM

## 2016-04-13 DIAGNOSIS — M47816 Spondylosis without myelopathy or radiculopathy, lumbar region: Secondary | ICD-10-CM

## 2016-04-13 DIAGNOSIS — F1010 Alcohol abuse, uncomplicated: Secondary | ICD-10-CM | POA: Insufficient documentation

## 2016-04-13 DIAGNOSIS — M130 Polyarthritis, unspecified: Secondary | ICD-10-CM

## 2016-04-13 DIAGNOSIS — G8929 Other chronic pain: Secondary | ICD-10-CM | POA: Insufficient documentation

## 2016-04-13 DIAGNOSIS — K210 Gastro-esophageal reflux disease with esophagitis: Secondary | ICD-10-CM | POA: Insufficient documentation

## 2016-04-13 DIAGNOSIS — K7030 Alcoholic cirrhosis of liver without ascites: Secondary | ICD-10-CM | POA: Insufficient documentation

## 2016-04-13 DIAGNOSIS — M2550 Pain in unspecified joint: Secondary | ICD-10-CM | POA: Insufficient documentation

## 2016-04-13 DIAGNOSIS — K7460 Unspecified cirrhosis of liver: Secondary | ICD-10-CM

## 2016-04-13 LAB — COMPLETE METABOLIC PANEL WITH GFR
ALT: 13 U/L (ref 6–29)
AST: 20 U/L (ref 10–35)
Albumin: 4.7 g/dL (ref 3.6–5.1)
Alkaline Phosphatase: 62 U/L (ref 33–130)
BILIRUBIN TOTAL: 0.3 mg/dL (ref 0.2–1.2)
BUN: 18 mg/dL (ref 7–25)
CHLORIDE: 101 mmol/L (ref 98–110)
CO2: 28 mmol/L (ref 20–31)
CREATININE: 0.77 mg/dL (ref 0.50–1.05)
Calcium: 9.4 mg/dL (ref 8.6–10.4)
GFR, Est Non African American: 88 mL/min (ref >=60)
Glucose, Bld: 88 mg/dL (ref 65–99)
Potassium: 4.1 mmol/L (ref 3.5–5.3)
Sodium: 135 mmol/L (ref 135–146)
TOTAL PROTEIN: 7.9 g/dL (ref 6.1–8.1)

## 2016-04-13 LAB — URIC ACID: URIC ACID, SERUM: 4.1 mg/dL (ref 2.5–7.0)

## 2016-04-13 NOTE — Patient Instructions (Addendum)
Rachel Hudson was seen today for back pain.  Diagnoses and all orders for this visit:  Spondylosis of lumbar region without myelopathy or radiculopathy -     Cancel: Ambulatory referral to Neurosurgery -     Ambulatory referral to Neurosurgery  Cirrhosis of liver without ascites, unspecified hepatic cirrhosis type (HCC) -     COMPLETE METABOLIC PANEL WITH GFR  Polyarthritis -     ANA,IFA RA Diag Pnl w/rflx Tit/Patn -     Uric Acid   F/u in 3 months for HTN  Dr. Armen PickupFunches

## 2016-04-13 NOTE — Progress Notes (Signed)
Subjective:  Patient ID: Rachel Hudson, female    DOB: 03-Dec-1960  Age: 55 y.o. MRN: [id number]  CC: Back Pain   HPI Rachel Hudson presents for   1. Chronic low back pain: since 2014. 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 This causes severe foraminal encroachment and compression of the L4 nerve root bilaterally. Remaining disc spaces appear normal.  She gets bilateral lumbar L3, L4 medial branch blocks and L5 dorsal ramus injections done by pain management which help. She has low back pain and stiffness. She walks unassisted. she walks with a limp.  She request referral to neurosurgery. She is uninsured.   2. HTN: taking norvasc 10 mg daily.  3. Cirrhosis: due to hep C and alcohol abuse. She had Hep C treated. Hep C has been undetectable since 07/2014. She has not abdominal pain or swelling. No leg swelling.  She has established with GI who did EGD and colonoscopy in 06/2015. She has no varices on EGD. She has erosive reflux esophagitis. Last limited abdominal US was 10/14/2014.   4. Joint pain: in addition to back pain she also has pain in knees, hips, feet, hands. No swelling or deformity. No redness.   Social History  Substance Use Topics  . Smoking status: Never Smoker  . Smokeless tobacco: Never Used     Comment: smokes marijuana daily  . Alcohol use No     Comment: 2 pints per week/stopped 01/10/14   Outpatient Medications Prior to Visit  Medication Sig Dispense Refill  . amLODipine (NORVASC) 10 MG tablet Take 1 tablet (10 mg total) by mouth daily. 30 tablet 5  . celecoxib (CELEBREX) 200 MG capsule Take 200 mg by mouth.    . gabapentin (NEURONTIN) 800 MG tablet Take 1.5 tablets (1,200 mg total) by mouth 3 (three) times daily. 135 tablet 0  . Multiple Vitamin (MULTIVITAMIN WITH MINERALS) TABS tablet Take 1 tablet by mouth daily.    . traMADol (ULTRAM) 50 MG tablet Take 1 tablet (50 mg total) by  mouth every 8 (eight) hours as needed. 90 tablet 2  . NONFORMULARY OR COMPOUNDED ITEM Med Name: Peripheral Neuropathy Cream: Bupivacaine 1%/ Doxepin 3%/ Gabapentin 6%/ Pentoxiflylline 3%/ Topiramate 1%    . nortriptyline (PAMELOR) 25 MG capsule Take 1 capsule (25 mg total) by mouth at bedtime. (Patient not taking: Reported on 04/13/2016) 30 capsule 5  . oxyCODONE (ROXICODONE) 5 MG immediate release tablet 1-2 tabs po q6 hours prn pain (Patient not taking: Reported on 04/13/2016) 20 tablet 0   No facility-administered medications prior to visit.     ROS Review of Systems  Constitutional: Negative for chills and fever.  Eyes: Negative for visual disturbance.  Respiratory: Negative for shortness of breath.   Cardiovascular: Negative for chest pain.  Gastrointestinal: Negative for abdominal pain and blood in stool.  Musculoskeletal: Positive for arthralgias, back pain and gait problem.  Skin: Negative for rash.  Allergic/Immunologic: Negative for immunocompromised state.  Hematological: Negative for adenopathy. Does not bruise/bleed easily.  Psychiatric/Behavioral: Negative for dysphoric mood and suicidal ideas.    Objective:  BP 129/83 (BP Location: Left Arm, Patient Position: Sitting, Cuff Size: Small)   Pulse (!) 55   Temp 98 F (36.7 C) (Oral)   Ht 5\' 1"  (1.549 m)   Wt 161 lb 3.2 oz (73.1 kg)   LMP 03/01/2016   SpO2 97%   BMI 30.46 kg/m  BP/Weight 04/13/2016 03/18/2016 01/29/2016  Systolic BP 129 112 130  Diastolic BP 83 78 90  Wt. (Lbs) 161.2 - 152  BMI 30.46 - 28.72    Physical Exam  Constitutional: She is oriented to person, place, and time. She appears well-developed and well-nourished. No distress.  HENT:  Head: Normocephalic and atraumatic.  Cardiovascular: Normal rate, regular rhythm, normal heart sounds and intact distal pulses.   Pulmonary/Chest: Effort normal and breath sounds normal.  Musculoskeletal: She exhibits no edema.  Neurological: She is alert and  oriented to person, place, and time. Gait abnormal.  Skin: Skin is warm and dry. No rash noted.  Psychiatric: She has a normal mood and affect.    Assessment & Plan:   Rachel Hudson was seen today for back pain.  Diagnoses and all orders for this visit:  Spondylosis of lumbar region without myelopathy or radiculopathy -     Cancel: Ambulatory referral to Neurosurgery -     Ambulatory referral to Neurosurgery  Cirrhosis of liver without ascites, unspecified hepatic cirrhosis type (HCC) -     COMPLETE METABOLIC PANEL WITH GFR  Polyarthritis -     ANA,IFA RA Diag Pnl w/rflx Tit/Patn -     Uric Acid   There are no diagnoses linked to this encounter.  No orders of the defined types were placed in this encounter.   Follow-up: Return in about 3 months (around 07/14/2016) for HTN.   Dessa PhiJosalyn Courtni Balash MD

## 2016-04-13 NOTE — Progress Notes (Signed)
Pt is here today for a [facility]. Pt states that medication is not working anymore.

## 2016-04-14 LAB — ANA,IFA RA DIAG PNL W/RFLX TIT/PATN
Anti Nuclear Antibody(ANA): NEGATIVE
Rhuematoid fact SerPl-aCnc: <14 IU/mL (ref <14)

## 2016-04-14 NOTE — Assessment & Plan Note (Signed)
Controlled. Continue current regimen. 

## 2016-04-14 NOTE — Assessment & Plan Note (Signed)
Neurosurgery referral placed Handicap parking placard form completed

## 2016-04-19 MED FILL — ?CELECOXIB 200 MG CAPSULE: 200 MG | 30 days supply | Qty: 30 | Fill #1

## 2016-04-21 ENCOUNTER — Telehealth: Payer: Self-pay

## 2016-04-21 MED FILL — VOLTAREN 1% GEL: 1 | 25 days supply | Qty: 100 | Fill #0

## 2016-04-21 NOTE — Telephone Encounter (Signed)
Pt was called and informed of lab results. 

## 2016-05-03 ENCOUNTER — Telehealth: Payer: Self-pay | Admitting: Family Medicine

## 2016-05-03 ENCOUNTER — Other Ambulatory Visit: Payer: Self-pay

## 2016-05-03 DIAGNOSIS — G5603 Carpal tunnel syndrome, bilateral upper limbs: Secondary | ICD-10-CM

## 2016-05-03 DIAGNOSIS — M47816 Spondylosis without myelopathy or radiculopathy, lumbar region: Secondary | ICD-10-CM

## 2016-05-03 DIAGNOSIS — I10 Essential (primary) hypertension: Secondary | ICD-10-CM

## 2016-05-03 MED ORDER — GABAPENTIN 800 MG PO TABS: 1200.0000 mg | ORAL_TABLET | Freq: Three times a day (TID) | ORAL | 0 refills | Status: DC

## 2016-05-03 MED ORDER — AMLODIPINE BESYLATE 10 MG PO TABS: 10.0000 mg | ORAL_TABLET | Freq: Every day | ORAL | 5 refills | Status: DC

## 2016-05-03 MED FILL — AMLODIPINE BESYLATE 10 MG T: 10 | 30 days supply | Qty: 30 | Fill #0

## 2016-05-03 MED FILL — GABAPENTIN 800 MG TABLET: 800 | 30 days supply | Qty: 135 | Fill #0

## 2016-05-03 NOTE — Telephone Encounter (Signed)
Medications have been filled

## 2016-05-03 NOTE — Telephone Encounter (Signed)
Patient is requesting gabapentin as prescribed previously, and amlodipine.  Please follow up with patient

## 2016-05-26 ENCOUNTER — Encounter: Payer: Self-pay | Admitting: Internal Medicine

## 2016-05-28 ENCOUNTER — Ambulatory Visit: Payer: Self-pay | Attending: Family Medicine

## 2016-06-01 MED FILL — ?CELECOXIB 200 MG CAPSULE: 200 MG | 30 days supply | Qty: 30 | Fill #2

## 2016-06-07 ENCOUNTER — Other Ambulatory Visit: Payer: Self-pay | Admitting: Family Medicine

## 2016-06-07 MED FILL — AMLODIPINE BESYLATE 10 MG T: 10 | 30 days supply | Qty: 30 | Fill #1

## 2016-06-15 ENCOUNTER — Ambulatory Visit: Payer: Self-pay | Attending: Family Medicine | Admitting: Family Medicine

## 2016-06-15 ENCOUNTER — Encounter: Payer: Self-pay | Admitting: Family Medicine

## 2016-06-15 VITALS — BP 130/84 | HR 49 | Temp 97.8°F | Ht 61.0 in | Wt 160.8 lb

## 2016-06-15 DIAGNOSIS — R001 Bradycardia, unspecified: Secondary | ICD-10-CM

## 2016-06-15 DIAGNOSIS — Z79899 Other long term (current) drug therapy: Secondary | ICD-10-CM | POA: Insufficient documentation

## 2016-06-15 DIAGNOSIS — K0510 Chronic gingivitis, plaque induced: Secondary | ICD-10-CM

## 2016-06-15 DIAGNOSIS — K7460 Unspecified cirrhosis of liver: Secondary | ICD-10-CM | POA: Insufficient documentation

## 2016-06-15 DIAGNOSIS — I10 Essential (primary) hypertension: Secondary | ICD-10-CM | POA: Insufficient documentation

## 2016-06-15 DIAGNOSIS — B1920 Unspecified viral hepatitis C without hepatic coma: Secondary | ICD-10-CM | POA: Insufficient documentation

## 2016-06-15 HISTORY — DX: Bradycardia, unspecified: R00.1

## 2016-06-15 HISTORY — DX: Chronic gingivitis, plaque induced: K05.10

## 2016-06-15 LAB — COMPLETE METABOLIC PANEL WITH GFR
ALBUMIN: 4.2 g/dL (ref 3.6–5.1)
ALK PHOS: 52 U/L (ref 33–130)
ALT: 11 U/L (ref 6–29)
AST: 17 U/L (ref 10–35)
BUN: 15 mg/dL (ref 7–25)
CALCIUM: 9.6 mg/dL (ref 8.6–10.4)
CHLORIDE: 105 mmol/L (ref 98–110)
CO2: 26 mmol/L (ref 20–31)
Creat: 0.76 mg/dL (ref 0.50–1.05)
GFR, EST NON AFRICAN AMERICAN: 89 mL/min (ref >=60)
Glucose, Bld: 80 mg/dL (ref 65–99)
POTASSIUM: 4.4 mmol/L (ref 3.5–5.3)
Sodium: 138 mmol/L (ref 135–146)
Total Bilirubin: 0.3 mg/dL (ref 0.2–1.2)
Total Protein: 7.4 g/dL (ref 6.1–8.1)

## 2016-06-15 LAB — CBC
HEMATOCRIT: 40.5 % (ref 35.0–45.0)
HEMOGLOBIN: 13.1 g/dL (ref 11.7–15.5)
MCH: 29.6 pg (ref 27.0–33.0)
MCHC: 32.3 g/dL (ref 32.0–36.0)
MCV: 91.4 fL (ref 80.0–100.0)
MPV: 11.7 fL (ref 7.5–12.5)
Platelets: 152 10*3/uL (ref 140–400)
RBC: 4.43 MIL/uL (ref 3.80–5.10)
RDW: 14.1 % (ref 11.0–15.0)
WBC: 7.9 10*3/uL (ref 3.8–10.8)

## 2016-06-15 MED ORDER — CLINDAMYCIN HCL 150 MG PO CAPS: 450.0000 mg | ORAL_CAPSULE | Freq: Three times a day (TID) | ORAL | 0 refills | Status: AC

## 2016-06-15 MED ORDER — HYDROCHLOROTHIAZIDE 12.5 MG PO TABS: 12.5000 mg | ORAL_TABLET | Freq: Every day | ORAL | 5 refills | Status: DC

## 2016-06-15 MED ORDER — NAPROXEN 500 MG PO TABS: 500.0000 mg | ORAL_TABLET | Freq: Two times a day (BID) | ORAL | 0 refills | Status: DC

## 2016-06-15 MED FILL — CLINDAMYCIN HCL 150 MG CAPS: 150 | 7 days supply | Qty: 63 | Fill #0

## 2016-06-15 MED FILL — ?HYDROCHLOROTHIAZIDE 12.5 M: 12.5 | 30 days supply | Qty: 30 | Fill #0

## 2016-06-15 MED FILL — NAPROXEN 500 MG TABLET: 500 | 15 days supply | Qty: 30 | Fill #0

## 2016-06-15 NOTE — Assessment & Plan Note (Signed)
Normal BP Bradycardia  Plan: Check electrolytes Stop norvasc and replace with HCTZ

## 2016-06-15 NOTE — Progress Notes (Signed)
Subjective:  Patient ID: Rachel Hudson, female    DOB: 03-Oct-1960  Age: 56 y.o. MRN: [id number]  CC: Hypertension   HPI SERETHA ESTABROOKS has hx of hep C and cirrhosis seh presents for   1. HTN: taking norvasc 10 mg daily. She ran out of few days ago. No HA, CP or SOB.  2. Dental pain: L upper molar pain x 1 month. Slight swelling in gum. Pain interferes with sleep and eating. No fever or chills.    Social History  Substance Use Topics  . Smoking status: Never Smoker  . Smokeless tobacco: Never Used     Comment: smokes marijuana daily  . Alcohol use No     Comment: 2 pints per week/stopped 01/10/14   Outpatient Medications Prior to Visit  Medication Sig Dispense Refill  . amLODipine (NORVASC) 10 MG tablet Take 1 tablet (10 mg total) by mouth daily. 30 tablet 5  . celecoxib (CELEBREX) 200 MG capsule Take 200 mg by mouth.    . gabapentin (NEURONTIN) 800 MG tablet Take 1.5 tablets (1,200 mg total) by mouth 3 (three) times daily. 135 tablet 0  . Multiple Vitamin (MULTIVITAMIN WITH MINERALS) TABS tablet Take 1 tablet by mouth daily.    . NONFORMULARY OR COMPOUNDED ITEM Med Name: Peripheral Neuropathy Cream: Bupivacaine 1%/ Doxepin 3%/ Gabapentin 6%/ Pentoxiflylline 3%/ Topiramate 1%    . traMADol (ULTRAM) 50 MG tablet Take 1 tablet (50 mg total) by mouth every 8 (eight) hours as needed. 90 tablet 2   No facility-administered medications prior to visit.     ROS Review of Systems  Constitutional: Negative for chills and fever.  Eyes: Negative for visual disturbance.  Respiratory: Negative for shortness of breath.   Cardiovascular: Negative for chest pain.  Gastrointestinal: Negative for abdominal pain and blood in stool.  Musculoskeletal: Positive for arthralgias, back pain and gait problem.  Skin: Negative for rash.  Allergic/Immunologic: Negative for immunocompromised state.  Hematological: Negative for adenopathy. Does not bruise/bleed easily.  Psychiatric/Behavioral:  Negative for dysphoric mood and suicidal ideas.    Objective:  BP 130/84 (BP Location: Left Arm, Patient Position: Sitting, Cuff Size: Small)   Pulse (!) 49   Temp 97.8 F (36.6 C) (Oral)   Ht 5\' 1"  (1.549 m)   Wt 160 lb 12.8 oz (72.9 kg)   LMP 03/01/2016   SpO2 98%   BMI 30.38 kg/m   BP/Weight 06/15/2016 04/13/2016 03/18/2016  Systolic BP 130 129 112  Diastolic BP 84 83 78  Wt. (Lbs) 160.8 161.2 -  BMI 30.38 30.46 -   Pulse Readings from Last 3 Encounters:  06/15/16 (!) 49  04/13/16 (!) 55  03/18/16 60     Physical Exam  Constitutional: She is oriented to person, place, and time. She appears well-developed and well-nourished. No distress.  HENT:  Head: Normocephalic and atraumatic.  Mouth/Throat:    Cardiovascular: Normal rate, regular rhythm, normal heart sounds and intact distal pulses.   Pulmonary/Chest: Effort normal and breath sounds normal.  Musculoskeletal: She exhibits no edema.  Neurological: She is alert and oriented to person, place, and time. Gait abnormal.  Skin: Skin is warm and dry. No rash noted.  Psychiatric: She has a normal mood and affect.    Assessment & Plan:   Fontaine was seen today for hypertension.  Diagnoses and all orders for this visit:  Essential hypertension -     hydrochlorothiazide (HYDRODIURIL) 12.5 MG tablet; Take 1 tablet (12.5 mg total) by mouth daily.  Sinus bradycardia -     COMPLETE METABOLIC PANEL WITH GFR -     CBC -     hydrochlorothiazide (HYDRODIURIL) 12.5 MG tablet; Take 1 tablet (12.5 mg total) by mouth daily.  Gingivitis -     clindamycin (CLEOCIN) 150 MG capsule; Take 3 capsules (450 mg total) by mouth 3 (three) times daily. -     naproxen (NAPROSYN) 500 MG tablet; Take 1 tablet (500 mg total) by mouth 2 (two) times daily with a meal. -     Ambulatory referral to Dentistry   There are no diagnoses linked to this encounter.  No orders of the defined types were placed in this encounter.   Follow-up:  Return in about 2 weeks (around 06/29/2016) for HTN and HR check .   Dessa PhiJosalyn Tiffiany Beadles MD

## 2016-06-15 NOTE — Patient Instructions (Addendum)
Rachel Hudson was seen today for hypertension.  Diagnoses and all orders for this visit:  Essential hypertension -     hydrochlorothiazide (HYDRODIURIL) 12.5 MG tablet; Take 1 tablet (12.5 mg total) by mouth daily.  Sinus bradycardia -     COMPLETE METABOLIC PANEL WITH GFR -     CBC -     hydrochlorothiazide (HYDRODIURIL) 12.5 MG tablet; Take 1 tablet (12.5 mg total) by mouth daily.  Gingivitis -     clindamycin (CLEOCIN) 150 MG capsule; Take 3 capsules (450 mg total) by mouth 3 (three) times daily. -     naproxen (NAPROSYN) 500 MG tablet; Take 1 tablet (500 mg total) by mouth 2 (two) times daily with a meal. -     Ambulatory referral to Dentistry   Due to low pulse, please stop norvasc Replace with HCTZ 12.5 mg daily    F/u in 2 weeks for RN BP and HR check  F/u with me in 3 months for HTN   Dr. Armen PickupFunches

## 2016-06-15 NOTE — Progress Notes (Signed)
Pt is needing a referral for eye doctor and dentist. Pt wants to get her liver and kidneys checked.

## 2016-06-15 NOTE — Assessment & Plan Note (Signed)
gingivitis PCN allergy  Plan: Dental referral Course of clindamycin Naproxen for pain

## 2016-06-18 ENCOUNTER — Telehealth: Payer: Self-pay

## 2016-06-18 DIAGNOSIS — M47816 Spondylosis without myelopathy or radiculopathy, lumbar region: Secondary | ICD-10-CM

## 2016-06-18 DIAGNOSIS — G5603 Carpal tunnel syndrome, bilateral upper limbs: Secondary | ICD-10-CM

## 2016-06-18 MED ORDER — GABAPENTIN 800 MG PO TABS
1200.0000 mg | ORAL_TABLET | Freq: Three times a day (TID) | ORAL | 5 refills | Status: DC
Start: 2016-06-18 — End: 2017-05-18

## 2016-06-18 NOTE — Telephone Encounter (Signed)
Gabapentin refilled to onsite pharmacy If she prefers another pharmacy please call or send in

## 2016-06-18 NOTE — Telephone Encounter (Signed)
Contacted pt to go over lab results pt is aware of results  *Pt states she is needing her gabapentin refill*

## 2016-06-18 NOTE — Addendum Note (Signed)
Addended by: Dessa PhiFUNCHES, Severin Bou on: 06/18/2016 05:24 PM   Modules accepted: Orders

## 2016-06-22 ENCOUNTER — Ambulatory Visit: Payer: Self-pay | Admitting: Physical Medicine & Rehabilitation

## 2016-07-01 ENCOUNTER — Ambulatory Visit: Payer: Self-pay | Attending: Family Medicine | Admitting: *Deleted

## 2016-07-01 VITALS — BP 135/83 | HR 53

## 2016-07-01 DIAGNOSIS — Z79899 Other long term (current) drug therapy: Secondary | ICD-10-CM | POA: Insufficient documentation

## 2016-07-01 DIAGNOSIS — I10 Essential (primary) hypertension: Secondary | ICD-10-CM | POA: Insufficient documentation

## 2016-07-01 MED FILL — GABAPENTIN 800 MG TABLET: 800 | 30 days supply | Qty: 135 | Fill #0

## 2016-07-01 NOTE — Progress Notes (Signed)
Pt is here to f/u on HR and BP from OV 06/15/2016. Pt denies chest pain, SOB, HA, new vison concerns, or generalized swelling. She does have concerns with non productive cough. Nurse visit will be routed to provider.Guy Francoravia Colburn Asper, RN, BSN

## 2016-07-07 ENCOUNTER — Telehealth: Payer: Self-pay

## 2016-07-07 DIAGNOSIS — M47816 Spondylosis without myelopathy or radiculopathy, lumbar region: Secondary | ICD-10-CM

## 2016-07-07 DIAGNOSIS — K0889 Other specified disorders of teeth and supporting structures: Secondary | ICD-10-CM

## 2016-07-07 DIAGNOSIS — I10 Essential (primary) hypertension: Secondary | ICD-10-CM

## 2016-07-07 NOTE — Telephone Encounter (Signed)
Pt came into office this morning stating that her BP medication HCTZ is causing her to have itchy throat. Pt states that she would like another medication prescribed to her. Pt also states that she thinks she has the flu. Please follow up.

## 2016-07-09 MED ORDER — CLINDAMYCIN HCL 300 MG PO CAPS: 300.0000 mg | ORAL_CAPSULE | Freq: Three times a day (TID) | ORAL | 0 refills | Status: DC

## 2016-07-09 MED ORDER — CHLORTHALIDONE 25 MG PO TABS: 25.0000 mg | ORAL_TABLET | Freq: Every day | ORAL | 2 refills | Status: DC

## 2016-07-09 MED FILL — ?CHLORTHALIDONE 25 MG TABLE: 25 | 30 days supply | Qty: 30 | Fill #0

## 2016-07-09 MED FILL — CLINDAMYCIN HCL 300 MG CAP: 300 | 10 days supply | Qty: 30 | Fill #0

## 2016-07-09 NOTE — Addendum Note (Signed)
Addended by: Dessa PhiFUNCHES, Raevin Wierenga on: 07/09/2016 08:59 AM   Modules accepted: Orders

## 2016-07-09 NOTE — Telephone Encounter (Signed)
Called patient Verified name and DOB She [facility]  She is having dental pain- clindamycin, dental referral placed  Sore throat with HCTZ, stopped when stopping HCTZ,  She has bradycardia with norvasc  Plan: Chlorthalidone

## 2016-07-29 DIAGNOSIS — M4316 Spondylolisthesis, lumbar region: Secondary | ICD-10-CM | POA: Insufficient documentation

## 2016-08-03 MED FILL — ?CHLORTHALIDONE 25 MG TABLE: 25 | 30 days supply | Qty: 30 | Fill #1

## 2016-08-09 ENCOUNTER — Encounter: Payer: Self-pay | Admitting: Nurse Practitioner

## 2016-08-09 ENCOUNTER — Telehealth: Payer: Self-pay | Admitting: Family Medicine

## 2016-08-09 ENCOUNTER — Ambulatory Visit (INDEPENDENT_AMBULATORY_CARE_PROVIDER_SITE_OTHER): Payer: Self-pay | Admitting: Nurse Practitioner

## 2016-08-09 VITALS — BP 125/80 | HR 61 | Temp 98.3°F | Ht 61.0 in | Wt 163.6 lb

## 2016-08-09 DIAGNOSIS — K219 Gastro-esophageal reflux disease without esophagitis: Secondary | ICD-10-CM

## 2016-08-09 DIAGNOSIS — R1084 Generalized abdominal pain: Secondary | ICD-10-CM

## 2016-08-09 DIAGNOSIS — K7460 Unspecified cirrhosis of liver: Secondary | ICD-10-CM

## 2016-08-09 DIAGNOSIS — R109 Unspecified abdominal pain: Secondary | ICD-10-CM | POA: Insufficient documentation

## 2016-08-09 MED ORDER — OMEPRAZOLE 20 MG PO CPDR: 20.0000 mg | DELAYED_RELEASE_CAPSULE | Freq: Every day | ORAL | 3 refills | Status: DC

## 2016-08-09 MED FILL — OMEPRAZOLE DR 20 MG CAPSULE: 20 | 30 days supply | Qty: 30 | Fill #0

## 2016-08-09 NOTE — Patient Instructions (Signed)
1. Have your labs drawn when you're able to. 2. Have your ultrasound completed when it is scheduled. I have recommended [facility] [place] as per your request. 3. I sent and Prilosec 20 mg to your pharmacy. Take this once a day on an empty stomach.  4. Call if your abdominal pain becomes significantly worse.

## 2016-08-09 NOTE — Assessment & Plan Note (Signed)
Annoying/nagging, but persistent abdominal discomfort. States she has regular bowel movements with complete emptying. Feels she has "abdominal swelling" depending on how she sitting or what she is doing. At this point I will follow through with a full abdominal ultrasound rather than a right upper quadrant limited ultrasound. [place]. Return for follow-up in 6 months for routine cirrhosis care. Call of abdominal pain becomes worse.

## 2016-08-09 NOTE — Progress Notes (Signed)
Referring Provider: Dessa Phi, MD Primary Care Physician:  Lora Paula, MD Primary GI:  Dr. Jena Gauss  Chief Complaint  Patient presents with  . Abdominal Pain    HPI:   Rachel Hudson is a 56 y.o. female who presents For follow-up on routine cirrhosis care. The patient was last seen in our office 01/15/2016 for cirrhosis. Also with a history of chronic hepatitis C and GERD. Colonoscopy and endoscopy up-to-date, due for repeat screening EGD in February 2019. Due for colonoscopy in February 2027. The time of her last visit she was having some left lower quadrant abdominal pain as well as right upper quadrant pain ascribed as an annoying soreness. Denies constipation or any other GI symptoms. Recommended continue to avoid alcohol and high risk behaviors for hepatitis C reinfection. Routine labs and imaging were ordered, recommended 6 month follow-up.  Labs demonstrate a meld score of 6 and child Pugh A. Right upper quadrant ultrasound found stable changes of cirrhosis and a stable septated cyst in the left lobe, no evidence of hepatocellular carcinoma, no acute findings.  Today she states she's ok overall. Has some abdominal swelling after sex, sitting on certain furniture, and walking. Also with some lower abdominal pain which is described as "annoying" and she would like to have it checked out. Has a history of GERD has started worsening and taking Alka Seltzers. Deneis yellowing of skin/eyes, hematochezia, melena, N/V, darkened urine, acute episodic confusion. Denies chest pain, dyspnea, dizziness, lightheadedness, syncope, near syncope. Denies any other upper or lower GI symptoms.  Past Medical History:  Diagnosis Date  . 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 GERD (gastroesophageal reflux disease)   . Hep C w/o coma, chronic (HCC) As of 10/22/13  . Hypertension     Past Surgical History:  Procedure Laterality Date  . CARPAL TUNNEL RELEASE Right 11/13/2015   Procedure: RIGHT CARPAL TUNNEL RELEASE;  Surgeon: Betha Loa, MD;  Location: [facility];  Service: Orthopedics;  Laterality: Right;  . CARPAL TUNNEL RELEASE Left 01/29/2016   Procedure: left CARPAL TUNNEL RELEASE;  Surgeon: Betha Loa, MD;  Location: [facility];  Service: Orthopedics;  Laterality: Left;  . COLONOSCOPY N/A 07/08/2015   Procedure: COLONOSCOPY;  Surgeon: Corbin Ade, MD;  [facility];  Service: Endoscopy;  Laterality: N/A;  230  . ESOPHAGOGASTRODUODENOSCOPY N/A 07/08/2015   Procedure: ESOPHAGOGASTRODUODENOSCOPY (EGD);  Surgeon: Corbin Ade, MD;  [facility];  Service: Endoscopy;  Laterality: N/A;  . LIGAMENT REPAIR Right 1980   Knee    Current Outpatient Prescriptions  Medication Sig Dispense Refill  . chlorthalidone (HYGROTON) 25 MG tablet Take 25 mg by mouth.    . gabapentin (NEURONTIN) 800 MG tablet Take 1.5 tablets (1,200 mg total) by mouth 3 (three) times daily. 135 tablet 5  . Multiple Vitamin (MULTIVITAMIN WITH MINERALS) TABS tablet Take 1 tablet by mouth daily.    . traMADol (ULTRAM) 50 MG tablet Take 1 tablet (50 mg total) by mouth every 8 (eight) hours as needed. 90 tablet 2   No current facility-administered medications for this visit.     Allergies as of 08/09/2016 - Review Complete 08/09/2016  Allergen Reaction Noted  . Penicillins  03/28/2011  . Tylenol [acetaminophen] Other (See Comments) 07/14/2014    Family History  Problem Relation Age of Onset  . Adopted: Yes  . Family history unknown: Yes    Social History   Social History  .  Marital status: Single    Spouse name: N/A  . Number of children: N/A  . Years of education: N/A   Social History Main Topics  . Smoking status: Never Smoker  . Smokeless tobacco: Never Used  . Alcohol use No     Comment: 2 pints per week/stopped 01/10/14  . Drug use: Yes    Frequency: 7.0 times per week    Types: Marijuana  . Sexual activity: Not Currently     Birth control/ protection: None   Other Topics Concern  . None   Social History Narrative  . None    Review of Systems: General: Negative for anorexia, weight loss, fever, chills, fatigue, weakness. ENT: Negative for hoarseness, difficulty swallowing. CV: Negative for chest pain, angina, palpitations, peripheral edema.  Respiratory: Negative for dyspnea at rest, cough, sputum, wheezing.  GI: See history of present illness. MS: Chronic back pain likely having surgery in the near future.  Endo: Negative for unusual weight change.  Heme: Negative for bruising or bleeding. Allergy: Negative for rash or hives.   Physical Exam: BP 125/80   Pulse 61   Temp 98.3 F (36.8 C) (Oral)   Ht 5\' 1"  (1.549 m)   Wt 163 lb 9.6 oz (74.2 kg)   LMP 03/01/2016   BMI 30.91 kg/m  General:   Alert and oriented. Pleasant and cooperative. Well-nourished and well-developed.  Head:  Normocephalic and atraumatic. Eyes:  Without icterus, sclera clear and conjunctiva pink.  Ears:  Normal auditory acuity. Cardiovascular:  S1, S2 present without murmurs appreciated. Extremities without clubbing or edema. Respiratory:  Clear to auscultation bilaterally. No wheezes, rales, or rhonchi. No distress.  Gastrointestinal:  +BS, rounded but soft, non-tender and non-distended. No HSM noted. No guarding or rebound. No masses appreciated.  Rectal:  Deferred  Musculoskalatal:  Symmetrical without gross deformities. Neurologic:  Alert and oriented x4;  grossly normal neurologically. Psych:  Alert and cooperative. Normal mood and affect. Heme/Lymph/Immune: No excessive bruising noted.    08/09/2016 8:32 AM   Disclaimer: This note was dictated with voice recognition software. Similar sounding words can inadvertently be transcribed and may not be corrected upon review.

## 2016-08-09 NOTE — Telephone Encounter (Signed)
Pt. Came into facility requesting to speak with her PCP. Pt. States she will be having back surgery. Pt. Also filled out a handy cap placard and it was put in her PCP box. Please f/u

## 2016-08-09 NOTE — Telephone Encounter (Signed)
Paperwork received. Pt will be called once form is filled out.

## 2016-08-09 NOTE — Progress Notes (Signed)
CC'D TO PCP °

## 2016-08-09 NOTE — Assessment & Plan Note (Signed)
Overall cirrhosis is well compensated based on previous labs and imaging. Her last meld score 6 months ago was 6. No esophageal varices, EGD up-to-date and due in 2019. Generally asymptomatic from a hepatic standpoint. At this point we will check routine labs including CBC, CMP, PTT/INR, AFP. We'll also check abdominal imaging for hepatocellular carcinoma as well as evaluation for abdominal discomfort complaint as noted below. Return for follow-up in 6 months.

## 2016-08-09 NOTE — Assessment & Plan Note (Signed)
Based on her description it seems like her acid reflux has been worsening over the past few months. She does have a history of peptic ulcer disease. At this point in order to head off the problem before becomes an issue I will start her on Prilosec 20 mg once a day. I will reevaluate this in 6 months at her next follow-up visit. Recommend call if further worsening.

## 2016-08-10 ENCOUNTER — Ambulatory Visit (HOSPITAL_COMMUNITY)
Admission: RE | Admit: 2016-08-10 | Discharge: 2016-08-10 | Disposition: A | Discharge status: Home / Self Care | Payer: Self-pay | Source: Ambulatory Visit | Attending: Nurse Practitioner | Admitting: Nurse Practitioner

## 2016-08-10 DIAGNOSIS — K769 Liver disease, unspecified: Secondary | ICD-10-CM | POA: Insufficient documentation

## 2016-08-10 DIAGNOSIS — R1084 Generalized abdominal pain: Secondary | ICD-10-CM

## 2016-08-10 DIAGNOSIS — K7460 Unspecified cirrhosis of liver: Secondary | ICD-10-CM | POA: Insufficient documentation

## 2016-08-10 DIAGNOSIS — K219 Gastro-esophageal reflux disease without esophagitis: Secondary | ICD-10-CM

## 2016-08-10 LAB — CBC WITH DIFFERENTIAL/PLATELET
BASOS ABS: 0 10*3/uL (ref 0.0–0.1)
BASOS PCT: 0 %
Eosinophils Absolute: 0.2 10*3/uL (ref 0.0–0.7)
Eosinophils Relative: 3 %
HEMATOCRIT: 42 % (ref 36.0–46.0)
HEMOGLOBIN: 14.2 g/dL (ref 12.0–15.0)
LYMPHS PCT: 42 %
Lymphs Abs: 3 10*3/uL (ref 0.7–4.0)
MCH: 30.1 pg (ref 26.0–34.0)
MCHC: 33.8 g/dL (ref 30.0–36.0)
MCV: 89 fL (ref 78.0–100.0)
MONOS PCT: 7 %
Monocytes Absolute: 0.5 10*3/uL (ref 0.1–1.0)
NEUTROS ABS: 3.5 10*3/uL (ref 1.7–7.7)
NEUTROS PCT: 48 %
Platelets: 156 10*3/uL (ref 150–400)
RBC: 4.72 MIL/uL (ref 3.87–5.11)
RDW: 14.2 % (ref 11.5–15.5)
WBC: 7.2 10*3/uL (ref 4.0–10.5)

## 2016-08-10 LAB — COMPREHENSIVE METABOLIC PANEL
ALK PHOS: 52 U/L (ref 38–126)
ALT: 21 U/L (ref 14–54)
AST: 23 U/L (ref 15–41)
Albumin: 4.3 g/dL (ref 3.5–5.0)
Anion gap: 7 (ref 5–15)
BILIRUBIN TOTAL: 0.6 mg/dL (ref 0.3–1.2)
BUN: 11 mg/dL (ref 6–20)
CALCIUM: 9.6 mg/dL (ref 8.9–10.3)
CO2: 32 mmol/L (ref 22–32)
CREATININE: 0.7 mg/dL (ref 0.44–1.00)
Chloride: 100 mmol/L — ABNORMAL LOW (ref 101–111)
GFR calc Af Amer: >60 mL/min (ref >60)
GLUCOSE: 96 mg/dL (ref 65–99)
Potassium: 4 mmol/L (ref 3.5–5.1)
Sodium: 139 mmol/L (ref 135–145)
TOTAL PROTEIN: 8.1 g/dL (ref 6.5–8.1)

## 2016-08-10 LAB — PROTIME-INR
INR: 1.03
Prothrombin Time: 13.5 seconds (ref 11.4–15.2)

## 2016-08-11 ENCOUNTER — Other Ambulatory Visit: Payer: Self-pay | Admitting: Family Medicine

## 2016-08-11 DIAGNOSIS — Z1231 Encounter for screening mammogram for malignant neoplasm of breast: Secondary | ICD-10-CM

## 2016-08-11 LAB — AFP TUMOR MARKER: AFP TUMOR MARKER: 2.7 ng/mL (ref 0.0–8.3)

## 2016-08-11 NOTE — Telephone Encounter (Signed)
Pt was called and informed of paperwork being ready for pick up. 

## 2016-08-13 ENCOUNTER — Ambulatory Visit
Admission: RE | Admit: 2016-08-13 | Discharge: 2016-08-13 | Disposition: A | Discharge status: Home / Self Care | Payer: No Typology Code available for payment source | Source: Ambulatory Visit | Attending: Family Medicine | Admitting: Family Medicine

## 2016-08-13 DIAGNOSIS — Z1231 Encounter for screening mammogram for malignant neoplasm of breast: Secondary | ICD-10-CM

## 2016-08-17 ENCOUNTER — Telehealth: Payer: Self-pay

## 2016-08-17 NOTE — Telephone Encounter (Signed)
Pt was called and informed of lab results. 

## 2016-08-23 MED FILL — GABAPENTIN 800 MG TABLET: 800 | 30 days supply | Qty: 135 | Fill #1

## 2016-09-01 ENCOUNTER — Ambulatory Visit: Payer: No Typology Code available for payment source | Attending: Family Medicine

## 2016-09-01 MED FILL — CHLORTHALIDONE 25 MG TABLET: 25 | 30 days supply | Qty: 30 | Fill #2

## 2016-09-01 MED FILL — ?HYDROCHLOROTHIAZIDE 12.5MG: 12.5 | 30 days supply | Qty: 30 | Fill #1

## 2016-09-01 MED FILL — AMLODIPINE BESYLATE 10 MG T: 10 | 30 days supply | Qty: 30 | Fill #2

## 2016-09-29 MED FILL — METHOCARBAMOL 500 MG TABLET: 500 | 1 days supply | Qty: 8 | Fill #0

## 2016-10-06 ENCOUNTER — Encounter: Payer: Self-pay | Admitting: Family Medicine

## 2016-10-12 ENCOUNTER — Other Ambulatory Visit: Payer: Self-pay | Admitting: Family Medicine

## 2016-10-12 DIAGNOSIS — I10 Essential (primary) hypertension: Secondary | ICD-10-CM

## 2016-10-12 MED ORDER — POTASSIUM CHLORIDE CRYS ER 20 MEQ PO TBCR: 20.0000 meq | EXTENDED_RELEASE_TABLET | Freq: Two times a day (BID) | ORAL | 2 refills | Status: DC

## 2016-10-12 MED ORDER — CHLORTHALIDONE 25 MG PO TABS: 25.0000 mg | ORAL_TABLET | Freq: Every day | ORAL | 2 refills | Status: DC

## 2016-10-12 MED FILL — AMLODIPINE BESYLATE 10 MG T: 10 | 30 days supply | Qty: 30 | Fill #3

## 2016-10-12 MED FILL — ?HYDROCHLOROTHIAZIDE 12.5MG: 12.5 | 30 days supply | Qty: 30 | Fill #2

## 2016-10-12 NOTE — Telephone Encounter (Signed)
Per chart review she has continued to take chlorthalidone 25 mg daily Last BP was 109/73 on 10/01/16, care everywhere Dr. Electa SniffBarnett.   Called patient Verified name and DOB She is taking chlorthalidone Noted low potassium of 3.4 last month Refilled chlorthalidone to onsite pharmacy and added potassium She agrees with plan and voices understanding

## 2016-10-13 MED FILL — POTASSIUM CL ER 20 MEQ TAB: 20 | 15 days supply | Qty: 30 | Fill #0

## 2016-10-13 MED FILL — CHLORTHALIDONE 25 MG TABLET: 25 | 30 days supply | Qty: 30 | Fill #0

## 2016-10-20 MED FILL — ?OMEPRAZOLE DR 20 MG CAPSUL: 20 | 30 days supply | Qty: 30 | Fill #1

## 2016-10-22 DIAGNOSIS — Z981 Arthrodesis status: Secondary | ICD-10-CM | POA: Insufficient documentation

## 2016-11-12 MED FILL — POTASSIUM CL ER 20 MEQ TAB: 20 | 15 days supply | Qty: 30 | Fill #1

## 2016-11-12 MED FILL — AMLODIPINE BESYLATE 10 MG T: 10 | 30 days supply | Qty: 30 | Fill #4

## 2016-11-12 MED FILL — GABAPENTIN 800 MG TABLET: 800 | 30 days supply | Qty: 135 | Fill #2

## 2016-11-19 ENCOUNTER — Ambulatory Visit: Payer: Self-pay | Attending: Internal Medicine | Admitting: Internal Medicine

## 2016-11-19 ENCOUNTER — Encounter: Payer: Self-pay | Admitting: Internal Medicine

## 2016-11-19 VITALS — BP 116/73 | HR 63 | Temp 98.5°F | Resp 16 | Wt 166.8 lb

## 2016-11-19 DIAGNOSIS — E669 Obesity, unspecified: Secondary | ICD-10-CM | POA: Insufficient documentation

## 2016-11-19 DIAGNOSIS — F988 Other specified behavioral and emotional disorders with onset usually occurring in childhood and adolescence: Secondary | ICD-10-CM | POA: Insufficient documentation

## 2016-11-19 DIAGNOSIS — R198 Other specified symptoms and signs involving the digestive system and abdomen: Secondary | ICD-10-CM

## 2016-11-19 DIAGNOSIS — R41840 Attention and concentration deficit: Secondary | ICD-10-CM

## 2016-11-19 DIAGNOSIS — Z6831 Body mass index (BMI) 31.0-31.9, adult: Secondary | ICD-10-CM | POA: Insufficient documentation

## 2016-11-19 DIAGNOSIS — K219 Gastro-esophageal reflux disease without esophagitis: Secondary | ICD-10-CM | POA: Insufficient documentation

## 2016-11-19 DIAGNOSIS — Z8619 Personal history of other infectious and parasitic diseases: Secondary | ICD-10-CM

## 2016-11-19 DIAGNOSIS — B1920 Unspecified viral hepatitis C without hepatic coma: Secondary | ICD-10-CM | POA: Insufficient documentation

## 2016-11-19 DIAGNOSIS — R1900 Intra-abdominal and pelvic swelling, mass and lump, unspecified site: Secondary | ICD-10-CM | POA: Insufficient documentation

## 2016-11-19 DIAGNOSIS — R21 Rash and other nonspecific skin eruption: Secondary | ICD-10-CM | POA: Insufficient documentation

## 2016-11-19 DIAGNOSIS — Z79899 Other long term (current) drug therapy: Secondary | ICD-10-CM | POA: Insufficient documentation

## 2016-11-19 DIAGNOSIS — I10 Essential (primary) hypertension: Secondary | ICD-10-CM | POA: Insufficient documentation

## 2016-11-19 DIAGNOSIS — Z880 Allergy status to penicillin: Secondary | ICD-10-CM | POA: Insufficient documentation

## 2016-11-19 MED ORDER — AMLODIPINE BESYLATE 10 MG PO TABS: 10.0000 mg | ORAL_TABLET | Freq: Every day | ORAL | 3 refills | Status: DC

## 2016-11-19 MED ORDER — OMEPRAZOLE 20 MG PO CPDR: 20.0000 mg | DELAYED_RELEASE_CAPSULE | Freq: Every day | ORAL | 3 refills | Status: DC

## 2016-11-19 MED ORDER — TRIAMCINOLONE ACETONIDE 0.1 % EX CREA: 1.0000 "application " | TOPICAL_CREAM | Freq: Two times a day (BID) | CUTANEOUS | 0 refills | Status: DC

## 2016-11-19 MED FILL — ?OMEPRAZOLE DR 20 MG CAPSUL: 20 | 30 days supply | Qty: 30 | Fill #0

## 2016-11-19 MED FILL — ?TRIAMCINOLONE 0.1% CREAM: 0.1 | 7 days supply | Qty: 30 | Fill #0

## 2016-11-19 NOTE — Patient Instructions (Signed)
Avoid drinking caffeinated beverages. Stay off of Hydroxycut. Continue healthy eating and regular exercise to achieve weight loss. Use the triamcinolone cream as needed for rash on her abdomen. I referred to to a psychiatrist as discussed today.

## 2016-11-19 NOTE — Progress Notes (Signed)
Patient ID: Rachel Hudson, female    DOB: [birth year]  MRN: [id number]  CC: re-establish; Hypertension; and Weight Gain   Subjective: Rachel Hudson is a 56 y.o. female who presents to become established with me as PCP Her concerns today include:  Patient with history of HTN, hepatitis C with cirrhosis followed by GI, GERD and chronic lower back pain with recent surgery for spinal fixation of L4-L5.   1.  "I want you to help me slow down.I feel I can do a million things at one time." -Has problems focusing and completing tasks. States that she eventually gets things done but she jumps from one thing to another -Patient states that she is always going and feels like she cannot slow down and relax. -"need something to help me relax and focus." -was on Hydroxycut to help with weight loss but she discontinued about 1 month ago Drinks coffee occasionally. No caffinated sodas -smokes weed daily to help with chronic back pain.  Denies any other subst use.   2.  HTN -only on Norvasc 10 mg and Fish Oil -not taking fluid pill but taking K+ supplement  3.Concern about wgh gain States she wgh 157 in April but now 166 lbs -rides stationary bike 50 mins daily and walks 2 miles daily -doing well with eating habits.  Eating only grill or bake meat and incorporates fruits and salads  4.  Complains of abdominal swelling after sex, prolong sitting, and walking long distance -Seen by gastroenterology in March of this year with similar complaints. Complete abdominal ultrasound done without acute findings.  5. Rash on abdomen -Appears is red spots scattered over the abdomen and posterior thorax. Very itchy. She uses calamine lotion. Occurs every year around summertime. She sweats a lot. -Boyfriend had them one time 1-2 yrs ago but nothing since Patient Active Problem List   Diagnosis Date Noted  . GERD (gastroesophageal reflux disease) 08/09/2016  . Abdominal pain 08/09/2016  . Sinus bradycardia  06/15/2016  . Gingivitis 06/15/2016  . ASCUS favor benign 11/13/2015  . History of hepatitis C 10/13/2015  . Diverticulosis of colon without hemorrhage   . Peptic ulcer   . Peptic ulcer disease   . 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 Lumbago 11/18/2014  . Carpal tunnel syndrome 11/18/2014  . Hepatic cirrhosis (HCC) 08/06/2014  . HTN (hypertension) 02/28/2014     Current Outpatient Prescriptions on File Prior to Visit  Medication Sig Dispense Refill  . gabapentin (NEURONTIN) 800 MG tablet Take 1.5 tablets (1,200 mg total) by mouth 3 (three) times daily. 135 tablet 5  . Multiple Vitamin (MULTIVITAMIN WITH MINERALS) TABS tablet Take 1 tablet by mouth daily.    . potassium chloride (K-DUR,KLOR-CON) 20 MEQ tablet Take 1 tablet (20 mEq total) by mouth 2 (two) times daily. 30 tablet 2   No current facility-administered medications on file prior to visit.     Allergies  Allergen Reactions  . Penicillins     Facial swelling Has patient had a PCN reaction causing immediate rash, facial/tongue/throat swelling, SOB or lightheadedness with hypotension: Yes Has patient had a PCN reaction causing severe rash involving mucus membranes or skin necrosis: No Has patient had a PCN reaction that required hospitalization No Has patient had a PCN reaction occurring within the last 10 years: Yes If all of the above answers are "NO", then may proceed with Cephalosporin use.   . Tylenol [Acetaminophen] Other (See Comments)    HX of  Hep. C    Social History   Social History  . Marital status: Single    Spouse name: N/A  . Number of children: N/A  . Years of education: N/A   Occupational History  . Not on file.   Social History Main Topics  . Smoking status: Never Smoker  . Smokeless tobacco: Never Used  . Alcohol use No     Comment: 2 pints per week/stopped 01/10/14  . Drug use: Yes    Frequency: 7.0 times per week    Types: Marijuana  . Sexual  activity: Not Currently    Birth control/ protection: None   Other Topics Concern  . Not on file   Social History Narrative  . No narrative on file    Family History  Problem Relation Age of Onset  . Adopted: Yes  . Family history unknown: Yes    Past Surgical History:  Procedure Laterality Date  . CARPAL TUNNEL RELEASE Right 11/13/2015   Procedure: RIGHT CARPAL TUNNEL RELEASE;  Surgeon: Betha Loa, MD;  Location: [facility];  Service: Orthopedics;  Laterality: Right;  . CARPAL TUNNEL RELEASE Left 01/29/2016   Procedure: left CARPAL TUNNEL RELEASE;  Surgeon: Betha Loa, MD;  Location: [facility];  Service: Orthopedics;  Laterality: Left;  . COLONOSCOPY N/A 07/08/2015   Procedure: COLONOSCOPY;  Surgeon: Corbin Ade, MD;  [facility];  Service: Endoscopy;  Laterality: N/A;  230  . ESOPHAGOGASTRODUODENOSCOPY N/A 07/08/2015   Procedure: ESOPHAGOGASTRODUODENOSCOPY (EGD);  Surgeon: Corbin Ade, MD;  [facility];  Service: Endoscopy;  Laterality: N/A;  . LIGAMENT REPAIR Right 1980   Knee    ROS: Review of Systems As stated above PHYSICAL EXAM: BP 116/73   Pulse 63   Temp 98.5 F (36.9 C) (Oral)   Resp 16   Wt 166 lb 12.8 oz (75.7 kg)   LMP 03/01/2016   SpO2 95%   BMI 31.52 kg/m   Wt Readings from Last 3 Encounters:  11/19/16 166 lb 12.8 oz (75.7 kg)  08/09/16 163 lb 9.6 oz (74.2 kg)  06/15/16 160 lb 12.8 oz (72.9 kg)   Physical Exam General appearance - alert, well appearing, middle-age African-American female and in no distress Mental status - patient standing and very animated during her visit. States that she feels restless. Mouth - no oral lesions. Throat is clear. Neck - neck is supple. No thyroid enlargement or thyroid nodules. Chest -chest clear bilaterally. Heart -regular rate and rhythm no gallops or murmurs heard. Abdomen -normal bowel sounds. Nondistended soft nontender. No palpable masses. No fluid  wave appreciated. Abdomen is mildly obese Extremities - no lower extremity edema  Skin - few slightly red spots seen on the lateral abdomen. No pustules.  GAD 7 : Generalized Anxiety Score 11/19/2016 06/15/2016 04/13/2016 11/10/2015  Nervous, Anxious, on Edge 3 3 3 3   Control/stop worrying (No Data) 0 0 0  Worry too much - different things (No Data) 0 0 0  Trouble relaxing 3 3 2 3   Restless 3 3 3 3   Easily annoyed or irritable 3 2 2 3   Afraid - awful might happen 0 0 0 0  Total GAD 7 Score - 11 10 12     ASSESSMENT AND PLAN: 1. Essential hypertension -At goal. Check BMP today for potassium level given that she was still taking potassium supplement after being off diuretic - amLODipine (NORVASC) 10 MG tablet; Take 1 tablet (10 mg total) by mouth daily.  Dispense: 90 tablet; Refill: 3 - Basic Metabolic Panel  2. Attention and concentration deficit -Check TSH. Symptoms suggest attention deficit. Refer to psychiatry for further evaluation - Ambulatory referral to Psychiatry - TSH  3. Gastroesophageal reflux disease, esophagitis presence not specified - omeprazole (PRILOSEC) 20 MG capsule; Take 1 capsule (20 mg total) by mouth daily.  Dispense: 90 capsule; Refill: 3  4. Obesity (BMI 30.0-34.9) -Encourage her to continue regular exercise and she has been doing and healthy eating habits. Advised against use of Hydroxycut  5. Rash in adult -Questionable etiology. May be heat rash given that it occurs only in the summer and with heavy sweating - triamcinolone cream (KENALOG) 0.1 %; Apply 1 application topically 2 (two) times daily.  Dispense: 30 g; Refill: 0  6. Generalized abdominal fullness -Patient does not have fluid wave to suggest ascites. She has mild abdominal obesity. Recent abdominal ultrasound complete without acute findings. I will hold off on doing any additional imaging  7. Hx of hepatitis C   Patient was given the opportunity to ask questions.  Patient verbalized  understanding of the plan and was able to repeat key elements of the plan.   Orders Placed This Encounter  Procedures  . TSH  . Basic Metabolic Panel  . Ambulatory referral to Psychiatry     Requested Prescriptions   Signed Prescriptions Disp Refills  . omeprazole (PRILOSEC) 20 MG capsule 90 capsule 3    Sig: Take 1 capsule (20 mg total) by mouth daily.  Marland Kitchen amLODipine (NORVASC) 10 MG tablet 90 tablet 3    Sig: Take 1 tablet (10 mg total) by mouth daily.  Marland Kitchen triamcinolone cream (KENALOG) 0.1 % 30 g 0    Sig: Apply 1 application topically 2 (two) times daily.    Return in about 3 months (around 02/19/2017).  Jonah Blue, MD, FACP

## 2016-11-20 LAB — BASIC METABOLIC PANEL
BUN/Creatinine Ratio: 16 (ref 9–23)
BUN: 11 mg/dL (ref 6–24)
CALCIUM: 9.5 mg/dL (ref 8.7–10.2)
CO2: 25 mmol/L (ref 20–29)
CREATININE: 0.69 mg/dL (ref 0.57–1.00)
Chloride: 102 mmol/L (ref 96–106)
GFR calc Af Amer: 113 mL/min/{1.73_m2} (ref >59)
GFR, EST NON AFRICAN AMERICAN: 98 mL/min/{1.73_m2} (ref >59)
GLUCOSE: 76 mg/dL (ref 65–99)
POTASSIUM: 4.6 mmol/L (ref 3.5–5.2)
SODIUM: 140 mmol/L (ref 134–144)

## 2016-11-20 LAB — TSH: TSH: 1.52 u[IU]/mL (ref 0.450–4.500)

## 2016-11-26 ENCOUNTER — Telehealth: Payer: Self-pay | Admitting: Internal Medicine

## 2016-11-26 ENCOUNTER — Telehealth: Payer: Self-pay

## 2016-11-26 MED ORDER — SULFAMETHOXAZOLE-TRIMETHOPRIM 800-160 MG PO TABS: 1.0000 | ORAL_TABLET | Freq: Two times a day (BID) | ORAL | 0 refills | Status: DC

## 2016-11-26 MED FILL — SULFAMETHOXAZOLE/TMP DS TAB: 800-160 | 7 days supply | Qty: 14 | Fill #0

## 2016-11-26 NOTE — Telephone Encounter (Signed)
Rxn for Bactrim sent to pharmacy.

## 2016-11-26 NOTE — Telephone Encounter (Signed)
Contacted pt to go over lab results pt is aware of results and doesn't have any questions or concerns  Pt states the boil she had when she came into the office now she she has more than what is was before. Pt is requesting an antibiotic to dry them out

## 2016-11-26 NOTE — Telephone Encounter (Signed)
-----   Message from Particia LatherJay'A R Pollock, [place] sent at 11/26/2016 10:31 AM EDT ----- Contacted pt to over her lab results and pt states the boil she had when she came into the office now she she has more than what is was before. Pt is requesting an antibiotic to dry them out

## 2016-12-03 ENCOUNTER — Ambulatory Visit: Payer: Self-pay | Attending: Family Medicine

## 2016-12-15 MED FILL — AMLODIPINE BESYLATE 10 MG T: 10 | 30 days supply | Qty: 30 | Fill #5

## 2016-12-27 IMAGING — CT CT ABD-PELV W/ CM
2 of 5 series · 16 of 46 positions shown, 18 images · IV contrast (omnipaque)
Comparison: None.

ADDENDUM:
The amount of contrast given was 100 mL, not 500 mL.
CLINICAL DATA: Acute generalized abdominal pain and distention.

EXAM:
CT ABDOMEN AND PELVIS WITH CONTRAST
TECHNIQUE: Multidetector CT imaging of the abdomen and pelvis was performed
using the standard protocol following bolus administration of
intravenous contrast.
CONTRAST:  500mL OMNIPAQUE IOHEXOL 300 MG/ML  SOLN

[Series 2: a/p w/ 5mm · axial · 0.93mm/px · z∈[-449,-44]mm · 13 of 93 slices shown, 15 images]
[im 6/93  soft-tissue]
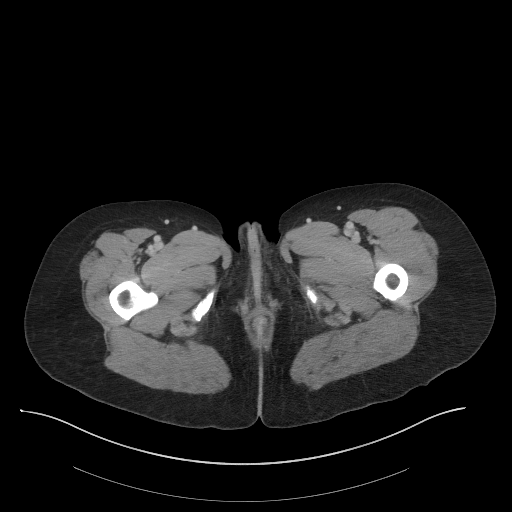
[im 6/93  bone]
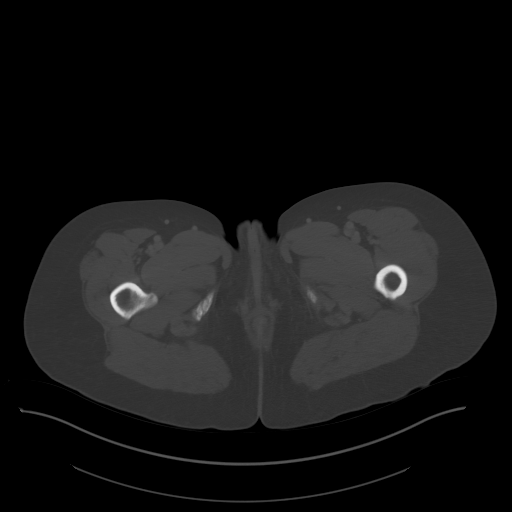
[im 12/93  soft-tissue]
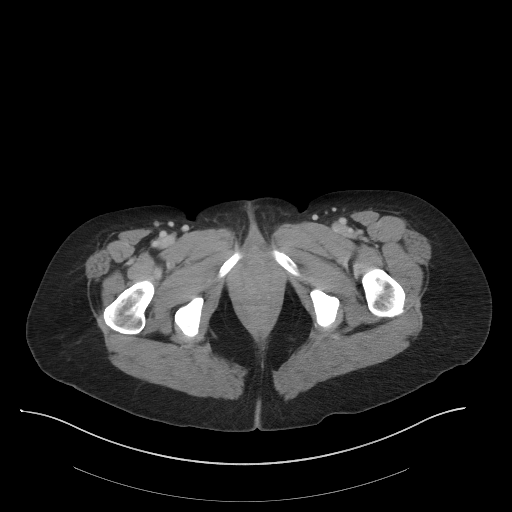
[im 18/93  soft-tissue]
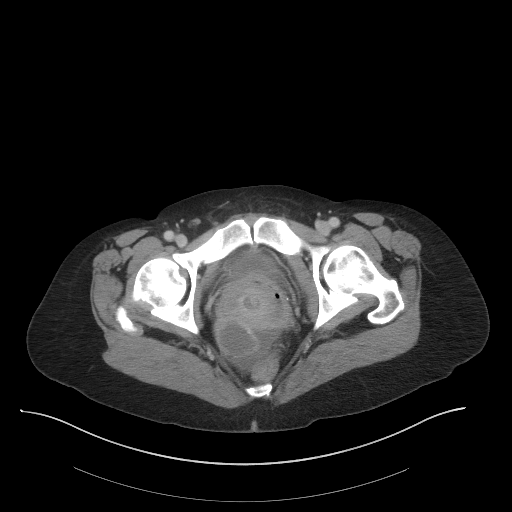
[im 29/93  soft-tissue]
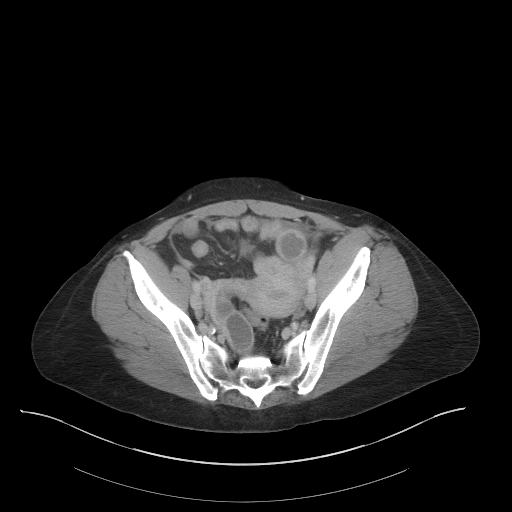
[im 35/93  soft-tissue]
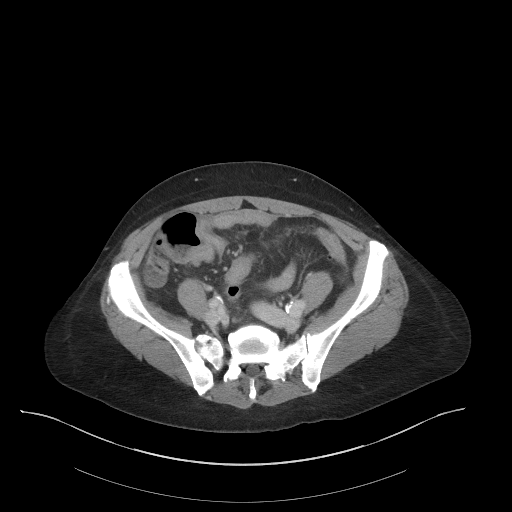
[im 41/93  soft-tissue]
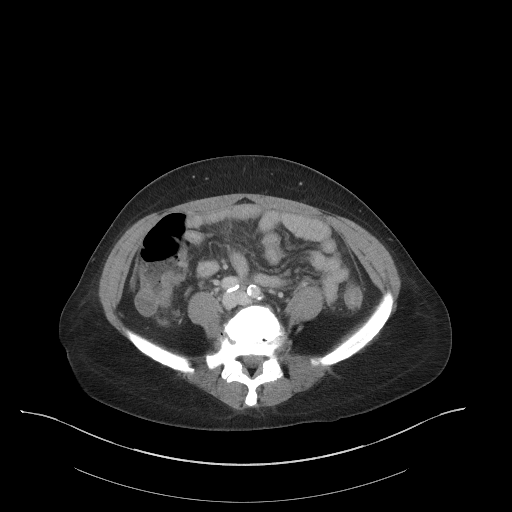
[im 47/93  soft-tissue]
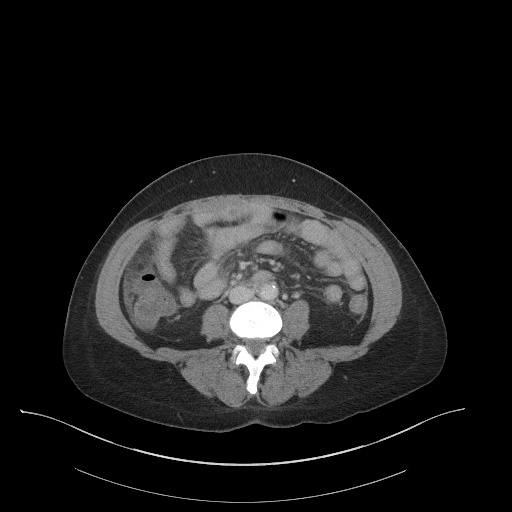
[im 52/93  soft-tissue]
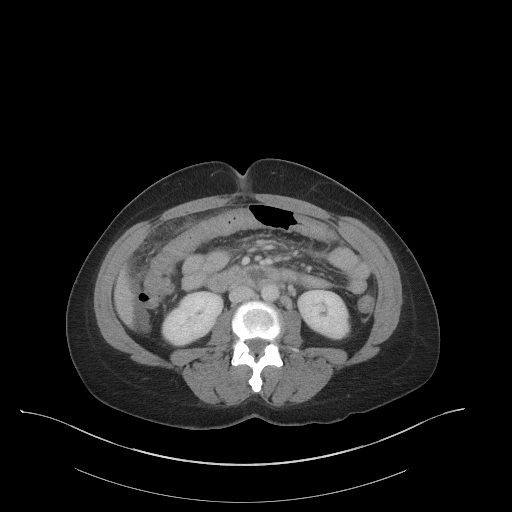
[im 58/93  soft-tissue]
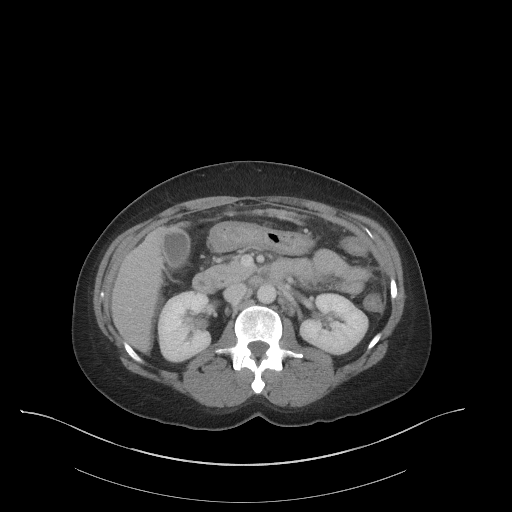
[im 58/93  bone]
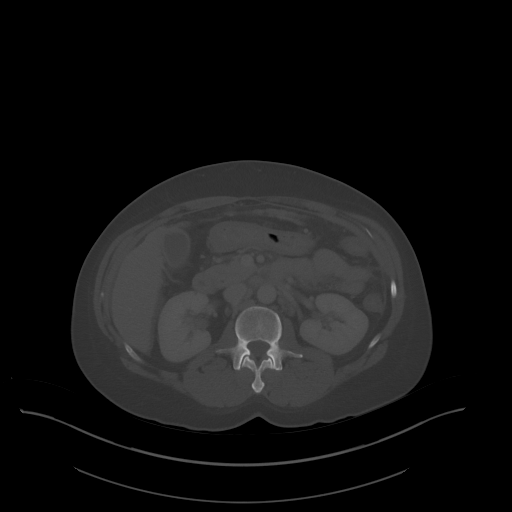
[im 64/93  soft-tissue]
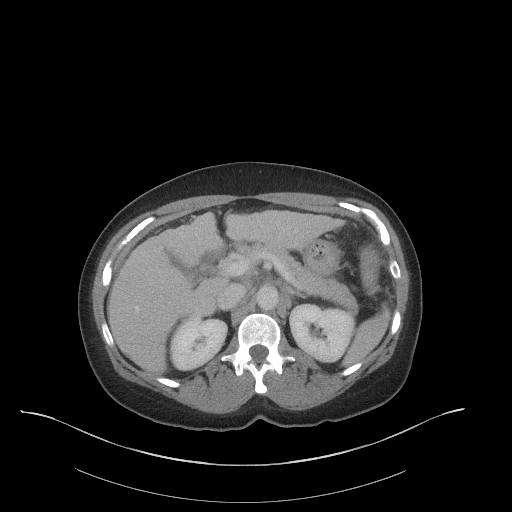
[im 75/93  soft-tissue]
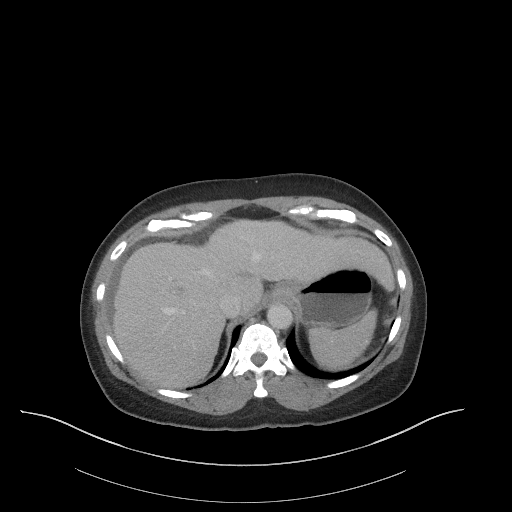
[im 81/93  soft-tissue]
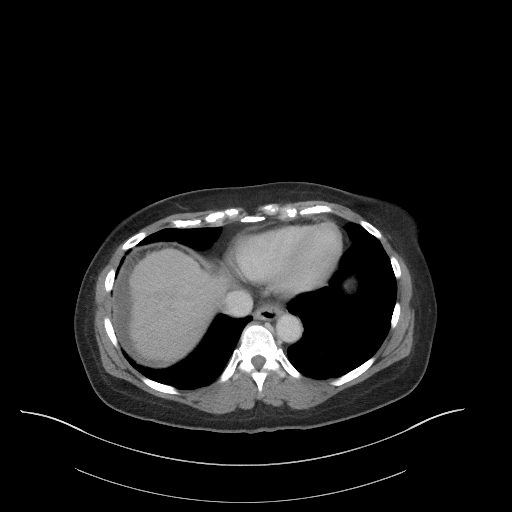
[im 87/93  soft-tissue]
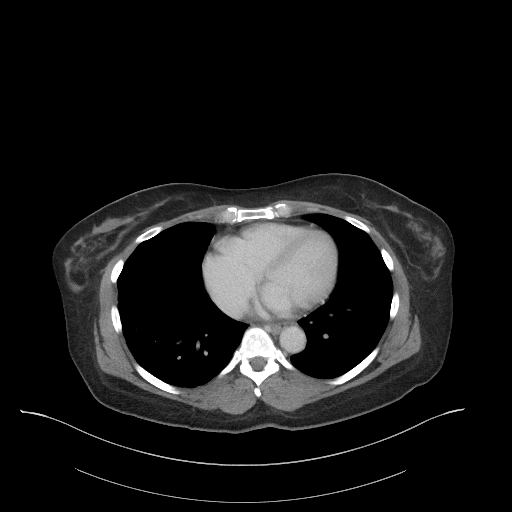

[Series 5: a/p w/ cor · coronal · 0.75mm/px · 3 of 144 slices shown]
[im 48/144  soft-tissue]
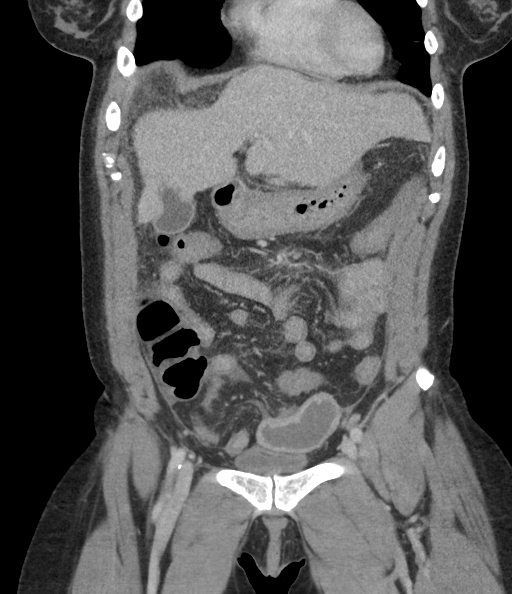
[im 64/144  soft-tissue]
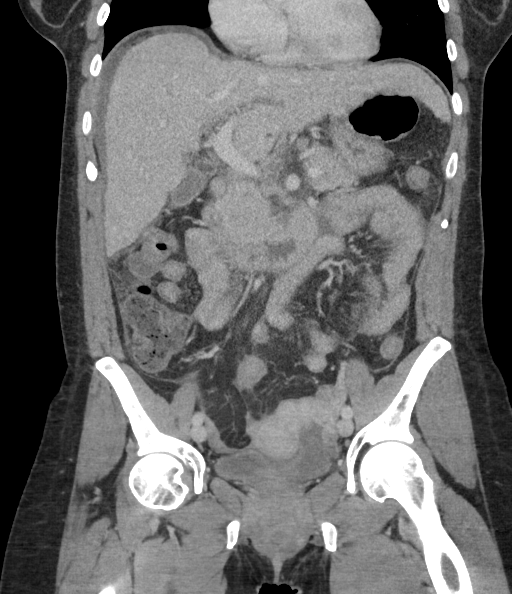
[im 80/144  soft-tissue]
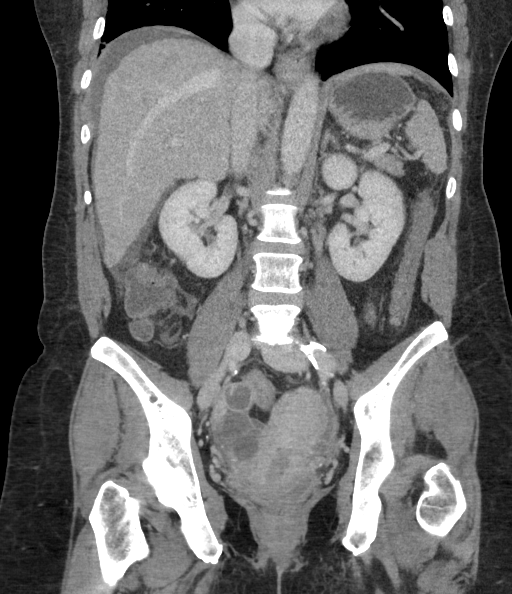

[16 of 46 positions shown; findings below may reference images not displayed]

FINDINGS: Severe degenerative disc disease is noted at L4-5, with grade 2
anterolisthesis at this level secondary to bilateral pars defects.
Visualized lung bases are unremarkable.

No gallstones are noted. Lobular hepatic margins are again noted
consistent with hepatic cirrhosis. No focal abnormality is noted in
the liver. The spleen and pancreas appear normal. Adrenal glands and
kidneys appear normal. No hydronephrosis or renal obstruction is
noted. There is no evidence of abdominal aortic aneurysm. The
appendix appears normal. There is no evidence of bowel obstruction.
Mild adenopathy is noted in porta hepatis region most likely related
to hepatic cirrhosis. Probable 3.6 cm fibroid is seen arising from
the uterine fundus. Bilateral hydrosalpinges are noted with
surrounding free fluid suggesting pelvic inflammatory disease.
Urinary bladder is decompressed.
IMPRESSION: Hepatic cirrhosis is noted.

Severe degenerative disc disease is noted at L4-5 with grade 2
anterolisthesis at this level secondary to bilateral pars defects.

Probable 3.6 cm uterine fibroid is seen.

Bilateral hydrosalpinx is noted in the pelvis with surrounding fluid
suggesting pelvic inflammatory disease.

## 2016-12-28 IMAGING — US US PELVIS COMPLETE
1 series · 15 of 25 positions shown · non-contrast
Comparison: Abdominal CT from yesterday

CLINICAL DATA: Pelvic inflammatory disease.

EXAM:
TRANSABDOMINAL ULTRASOUND OF PELVIS
TECHNIQUE: Transabdominal ultrasound examination of the pelvis was performed
including evaluation of the uterus, ovaries, adnexal regions, and
pelvic cul-de-sac.

[Series 1: us pelvis complete · 15 of 37 slices shown]
[im 1/37]
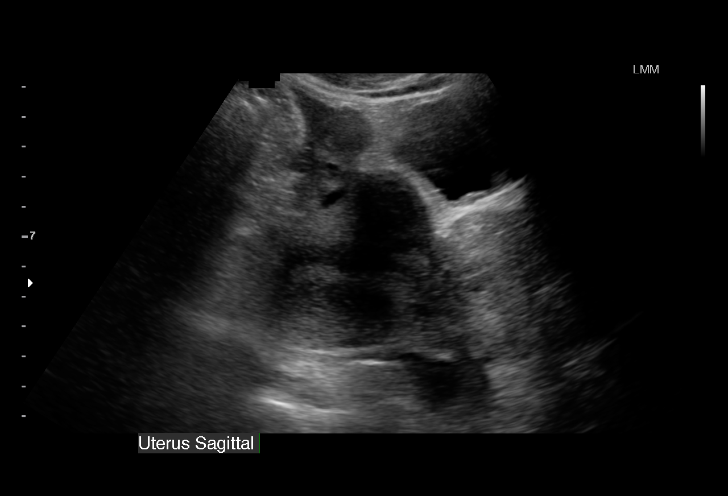
[im 4/37]
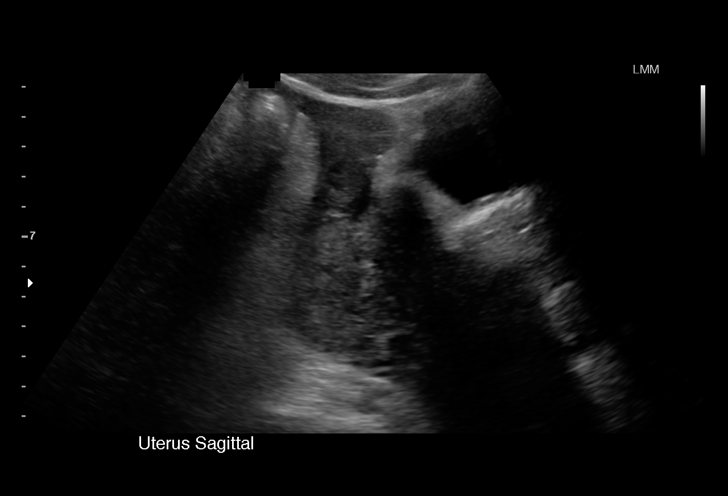
[im 7/37]
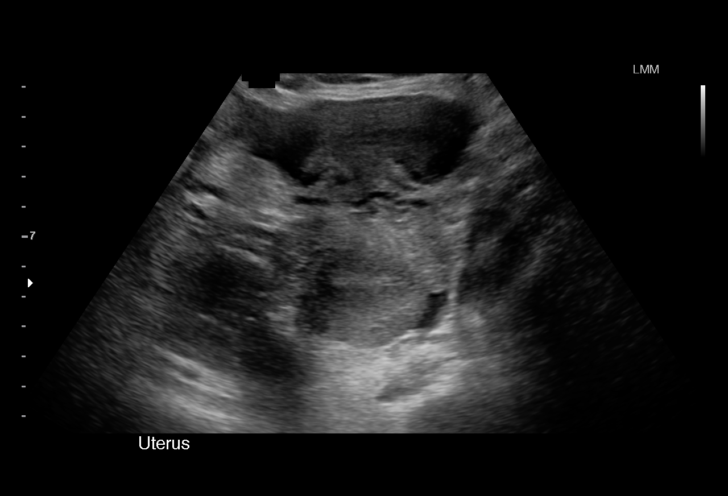
[im 8/37]
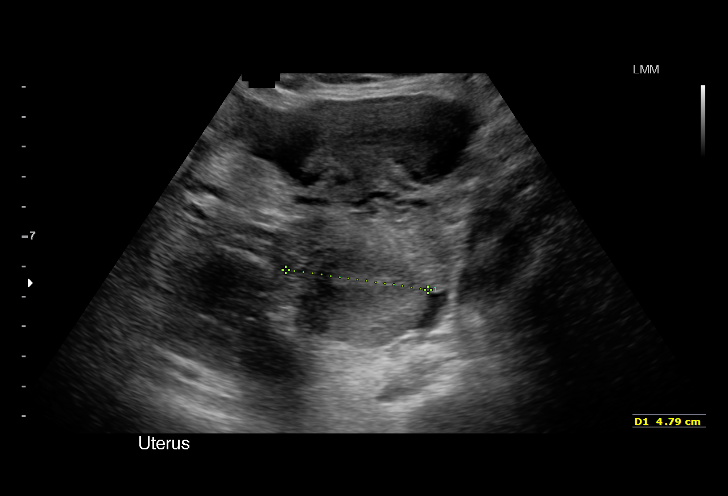
[im 11/37]
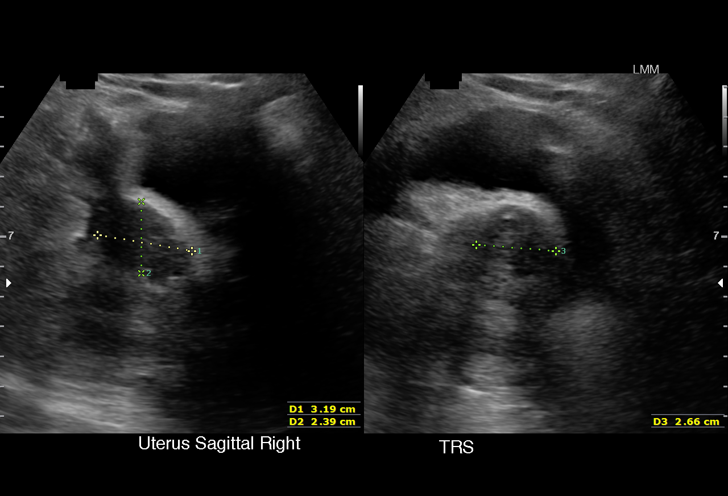
[im 14/37]
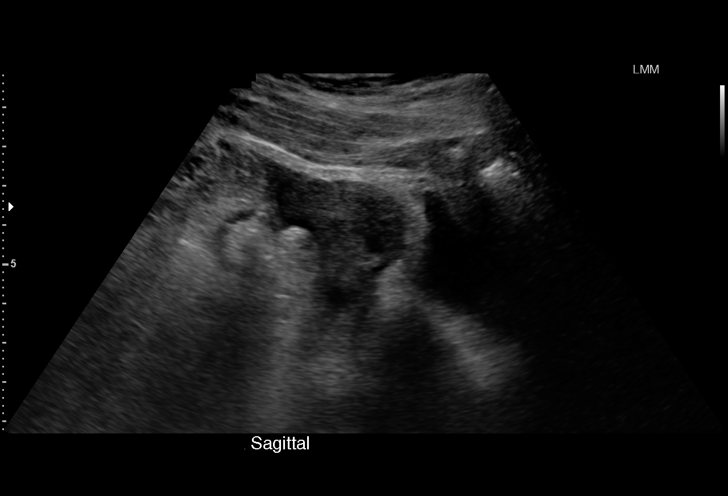
[im 16/37]
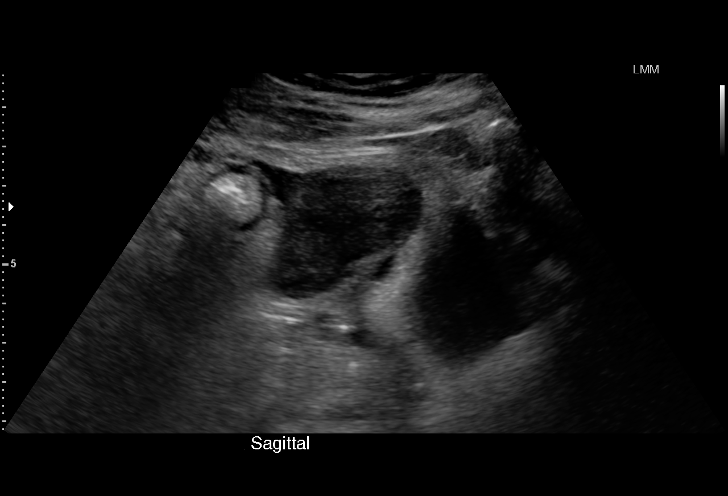
[im 19/37]
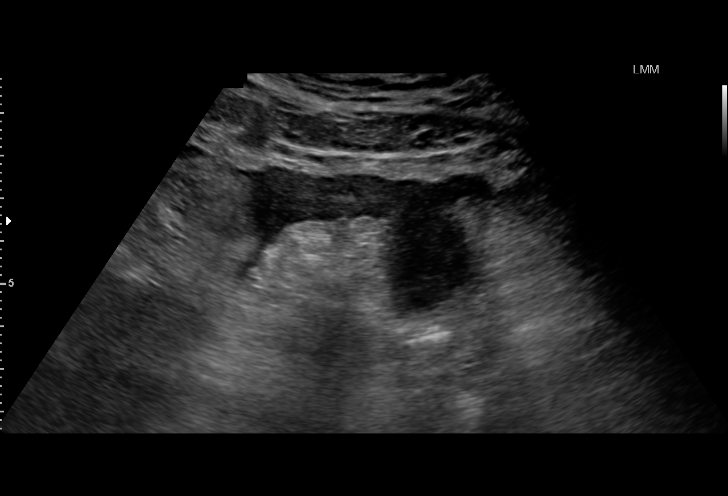
[im 22/37]
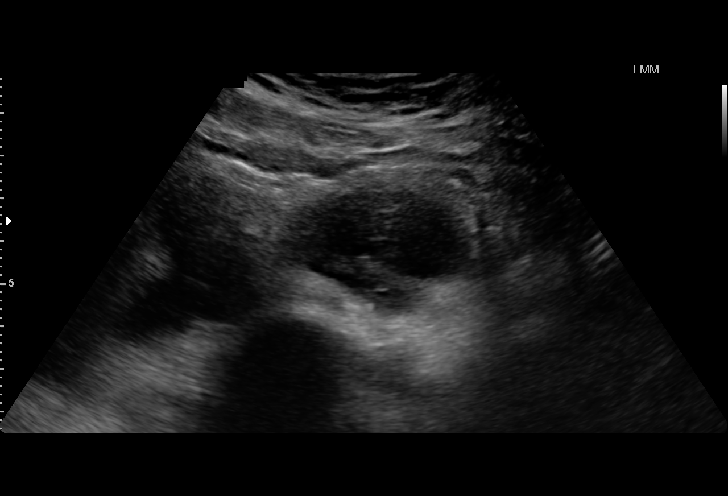
[im 23/37]
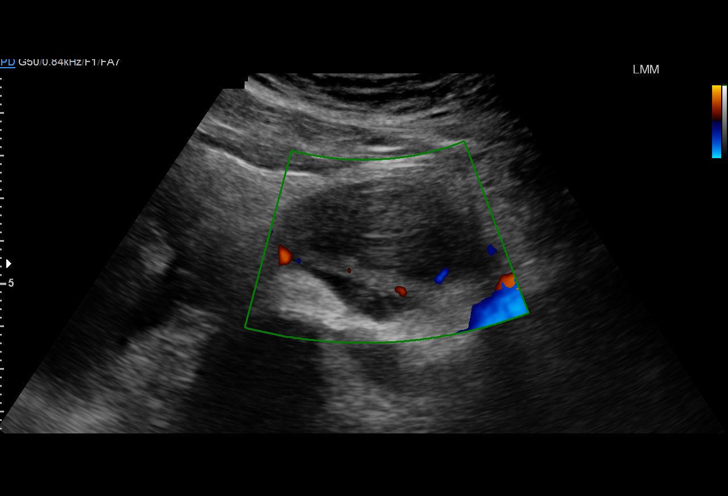
[im 26/37]
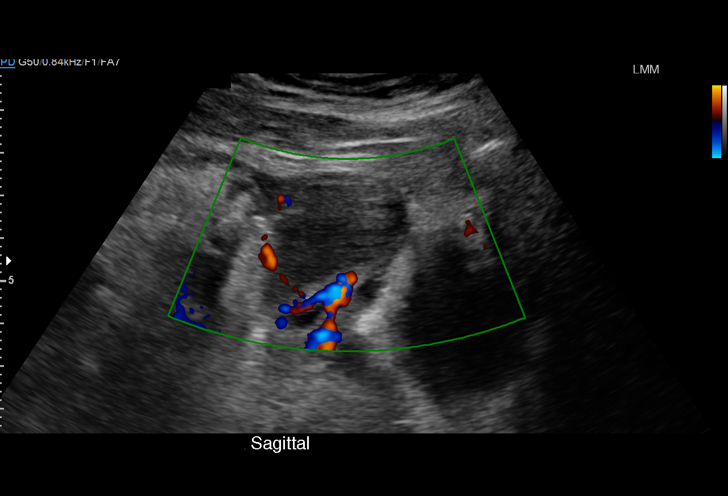
[im 29/37]
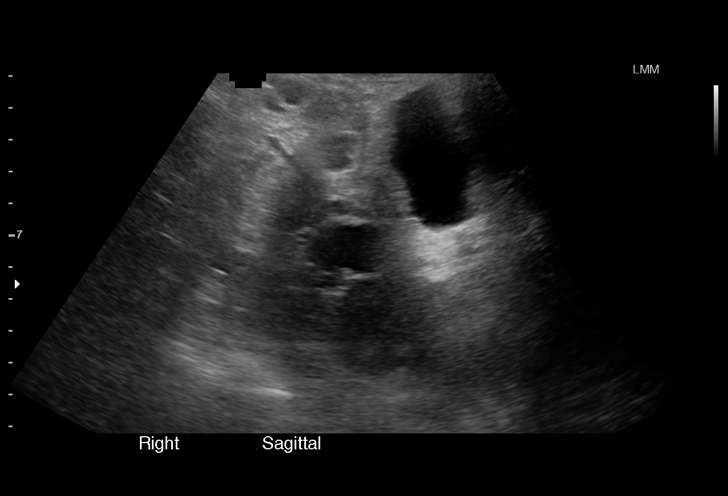
[im 31/37]
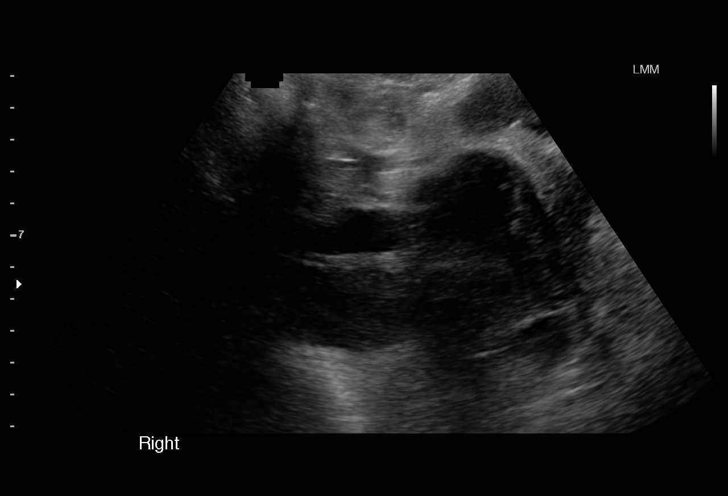
[im 34/37]
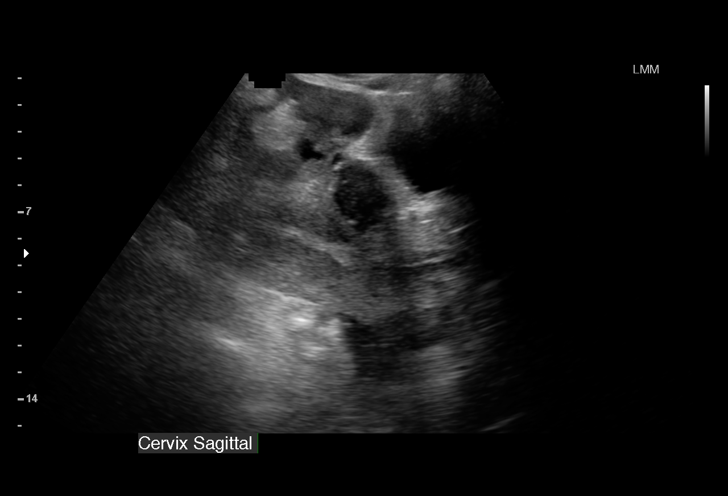
[im 37/37]
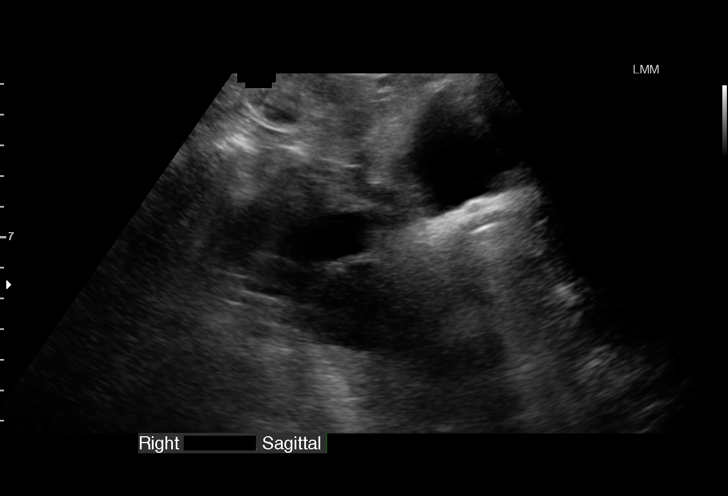

[15 of 25 positions shown; findings below may reference images not displayed]

FINDINGS: Uterus

Measurements: 9 x 6 x 5 cm. Hypoechoic sub serosal mass in the
anterior body measuring 32 mm.

Endometrium

Thickness: 6 mm.  No focal abnormality visualized.

Right ovary

Not clearly discernible due to tubular structure measuring 5 cm in
length by 4 cm in diameter with internal mid level echoes. The
length of the dilated tube is underestimated relative to previous
CT).

Left ovary

Not discernible due to tubular structure in the adnexum measuring 7
cm in length by 4 cm in diameter, with internal mid level echoes.

Other findings:  Small volume free fluid in the deep pelvis.
IMPRESSION: 1. Bilateral hydrosalpinx or pyosalpinx.
2. Ovaries not visualized separate from the tubes.
3. 3 cm sub serosal fibroid.

## 2017-01-10 MED FILL — AMLODIPINE BESYLATE 10 MG T: 10 | 90 days supply | Qty: 90 | Fill #0

## 2017-01-10 MED FILL — GABAPENTIN 800 MG TABLET: 800 | 30 days supply | Qty: 135 | Fill #3

## 2017-01-18 ENCOUNTER — Telehealth: Payer: Self-pay | Admitting: Family Medicine

## 2017-01-18 ENCOUNTER — Other Ambulatory Visit: Payer: Self-pay | Admitting: Internal Medicine

## 2017-01-18 NOTE — Telephone Encounter (Signed)
Patient came by the office asking to speak with nurser/PCP in regards to the current medication that she is taking for boils. Pt stated that it's not working and needs something stronger. Please follow up.  Thank you.

## 2017-01-19 NOTE — Telephone Encounter (Signed)
Patient has been scheduled for 8/30 to see PCP.

## 2017-01-20 ENCOUNTER — Other Ambulatory Visit: Payer: Self-pay | Admitting: Internal Medicine

## 2017-01-20 ENCOUNTER — Ambulatory Visit: Payer: Self-pay | Attending: Internal Medicine | Admitting: Internal Medicine

## 2017-01-21 ENCOUNTER — Ambulatory Visit: Payer: Self-pay | Attending: Internal Medicine | Admitting: Internal Medicine

## 2017-01-21 ENCOUNTER — Encounter: Payer: Self-pay | Admitting: Internal Medicine

## 2017-01-21 ENCOUNTER — Other Ambulatory Visit: Payer: Self-pay | Admitting: Internal Medicine

## 2017-01-21 VITALS — BP 131/81 | HR 50 | Temp 98.4°F | Resp 16 | Wt 168.0 lb

## 2017-01-21 DIAGNOSIS — R198 Other specified symptoms and signs involving the digestive system and abdomen: Secondary | ICD-10-CM

## 2017-01-21 DIAGNOSIS — Z6830 Body mass index (BMI) 30.0-30.9, adult: Secondary | ICD-10-CM | POA: Insufficient documentation

## 2017-01-21 DIAGNOSIS — K0510 Chronic gingivitis, plaque induced: Secondary | ICD-10-CM | POA: Insufficient documentation

## 2017-01-21 DIAGNOSIS — B1920 Unspecified viral hepatitis C without hepatic coma: Secondary | ICD-10-CM | POA: Insufficient documentation

## 2017-01-21 DIAGNOSIS — G8929 Other chronic pain: Secondary | ICD-10-CM | POA: Insufficient documentation

## 2017-01-21 DIAGNOSIS — R21 Rash and other nonspecific skin eruption: Secondary | ICD-10-CM

## 2017-01-21 DIAGNOSIS — K7460 Unspecified cirrhosis of liver: Secondary | ICD-10-CM | POA: Insufficient documentation

## 2017-01-21 DIAGNOSIS — M47816 Spondylosis without myelopathy or radiculopathy, lumbar region: Secondary | ICD-10-CM | POA: Insufficient documentation

## 2017-01-21 DIAGNOSIS — Z880 Allergy status to penicillin: Secondary | ICD-10-CM | POA: Insufficient documentation

## 2017-01-21 DIAGNOSIS — Z981 Arthrodesis status: Secondary | ICD-10-CM | POA: Insufficient documentation

## 2017-01-21 DIAGNOSIS — M545 Low back pain: Secondary | ICD-10-CM | POA: Insufficient documentation

## 2017-01-21 DIAGNOSIS — R1900 Intra-abdominal and pelvic swelling, mass and lump, unspecified site: Secondary | ICD-10-CM

## 2017-01-21 DIAGNOSIS — K279 Peptic ulcer, site unspecified, unspecified as acute or chronic, without hemorrhage or perforation: Secondary | ICD-10-CM | POA: Insufficient documentation

## 2017-01-21 DIAGNOSIS — I10 Essential (primary) hypertension: Secondary | ICD-10-CM | POA: Insufficient documentation

## 2017-01-21 DIAGNOSIS — Z23 Encounter for immunization: Secondary | ICD-10-CM

## 2017-01-21 DIAGNOSIS — R001 Bradycardia, unspecified: Secondary | ICD-10-CM | POA: Insufficient documentation

## 2017-01-21 DIAGNOSIS — G5600 Carpal tunnel syndrome, unspecified upper limb: Secondary | ICD-10-CM | POA: Insufficient documentation

## 2017-01-21 DIAGNOSIS — E669 Obesity, unspecified: Secondary | ICD-10-CM

## 2017-01-21 DIAGNOSIS — K5730 Diverticulosis of large intestine without perforation or abscess without bleeding: Secondary | ICD-10-CM | POA: Insufficient documentation

## 2017-01-21 DIAGNOSIS — K219 Gastro-esophageal reflux disease without esophagitis: Secondary | ICD-10-CM | POA: Insufficient documentation

## 2017-01-21 NOTE — Patient Instructions (Signed)

## 2017-01-21 NOTE — Progress Notes (Signed)
Patient ID: Rachel Hudson, female    DOB: 10-Dec-1960  MRN: [id number]  CC:  Subjective:  Rachel Hudson is a 56 y.o. female who presents for UC visit. Her concerns today include:  Patient with history of HTN, hepatitis C with cirrhosis followed by GI, GERD and chronic lower back pain with recent surgery for spinal fixation of L4-L5.   1.  "I want to be checked for ovarian cancer." Still concern about abdominal swelling "after sex or if I sit on lawn chair." She also feels her abdomen swells when she is up and active. She feels the abdominal swelling is preventing her from losing weight -Had abdominal ultrasound a few months ago by GI for similar complaints "I just doing know where the wgh gain is coming from." "I eat right; I ride a bicycle and walk ever morning". Taking diet pills. NL TSH 6/208 -had PAP 10/2015.  2. On last visit she complains of having boils on the skin every yr around this time of yr. Itchy -They will quite little pimples that are red and very irritating.  -No boils on last visit. Questionable heat rash. Given triamcinolone cream which he said did not help. She had called back requesting antibiotics. Given Bactrim. States that the problem still occurs and feel she needs a stronger antibiotic -No extensive outbreak at this time. States she has a few in the belly button and arms.    Patient Active Problem List   Diagnosis Date Noted  . GERD (gastroesophageal reflux disease) 08/09/2016  . Abdominal pain 08/09/2016  . Sinus bradycardia 06/15/2016  . Gingivitis 06/15/2016  . ASCUS favor benign 11/13/2015  . History of hepatitis C 10/13/2015  . Diverticulosis of colon without hemorrhage   . Peptic ulcer   . Peptic ulcer disease   . 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 Lumbago 11/18/2014  . Carpal tunnel syndrome 11/18/2014  . Hepatic cirrhosis (HCC) 08/06/2014  . HTN (hypertension) 02/28/2014     Current Outpatient  Prescriptions on File Prior to Visit  Medication Sig Dispense Refill  . amLODipine (NORVASC) 10 MG tablet Take 1 tablet (10 mg total) by mouth daily. 90 tablet 3  . gabapentin (NEURONTIN) 800 MG tablet Take 1.5 tablets (1,200 mg total) by mouth 3 (three) times daily. 135 tablet 5  . Multiple Vitamin (MULTIVITAMIN WITH MINERALS) TABS tablet Take 1 tablet by mouth daily.    . Omega-3 Fatty Acids (FISH OIL) 1200 MG CAPS Take by mouth.    Marland Kitchen omeprazole (PRILOSEC) 20 MG capsule Take 1 capsule (20 mg total) by mouth daily. 90 capsule 3  . potassium chloride (K-DUR,KLOR-CON) 20 MEQ tablet Take 1 tablet (20 mEq total) by mouth 2 (two) times daily. 30 tablet 2  . sulfamethoxazole-trimethoprim (BACTRIM DS,SEPTRA DS) 800-160 MG tablet Take 1 tablet by mouth 2 (two) times daily. (Patient not taking: Reported on 01/21/2017) 14 tablet 0  . triamcinolone cream (KENALOG) 0.1 % Apply 1 application topically 2 (two) times daily. 30 g 0   No current facility-administered medications on file prior to visit.     Allergies  Allergen Reactions  . Penicillins     Facial swelling Has patient had a PCN reaction causing immediate rash, facial/tongue/throat swelling, SOB or lightheadedness with hypotension: Yes Has patient had a PCN reaction causing severe rash involving mucus membranes or skin necrosis: No Has patient had a PCN reaction that required hospitalization No Has patient had a PCN reaction occurring within the last 10  years: Yes If all of the above answers are "NO", then may proceed with Cephalosporin use.   . Tylenol [Acetaminophen] Other (See Comments)    HX of Hep. C     ROS: Review of Systems Negative except as stated above  PHYSICAL EXAM: BP 131/81   Pulse (!) 50   Temp 98.4 F (36.9 C) (Oral)   Resp 16   Wt 168 lb (76.2 kg)   LMP 03/01/2016   SpO2 98%   BMI 31.74 kg/m   Wt Readings from Last 3 Encounters:  01/21/17 168 lb (76.2 kg)  11/19/16 166 lb 12.8 oz (75.7 kg)  08/09/16 163 lb  9.6 oz (74.2 kg)    Physical Exam General appearance - alert, well appearing, and in no distress Mental status - patient pacing around the exam room.  Abdomen -abdomen obese. No fluid wave appreciated. No palpable masses.  ASSESSMENT AND PLAN: 1. Generalized abdominal fullness Given that this is a continued concern of hers we will go ahead and do a pelvic ultrasound to check uterus and ovaries. Abdominal ultrasound was done earlier this year. She is convinced that there is some other issue causing the abdominal obesity - US Transvaginal Non-OB; Future  2. Rash and nonspecific skin eruption - Ambulatory referral to Dermatology  3. Obesity (BMI 30-39.9) -Recommended referral to a nutritionist. Patient declines.   4. Need for influenza vaccination - Flu Vaccine QUAD 6+ mos PF IM (Fluarix Quad PF)  5. Pelvic fullness in female - - US Pelvis Complete; Future - US Transvaginal Non-OB; Future  Patient was given the opportunity to ask questions.  Patient verbalized understanding of the plan and was able to repeat key elements of the plan.   Orders Placed This Encounter  Procedures  . US Pelvis Complete  . US Transvaginal Non-OB  . Flu Vaccine QUAD 6+ mos PF IM (Fluarix Quad PF)  . Ambulatory referral to Dermatology     Requested Prescriptions    No prescriptions requested or ordered in this encounter    Future Appointments Date Time Provider Department Center  01/27/2017 11:30 AM [facility]  02/03/2017 9:00 AM Arfeen, Phillips GroutSyed T, MD [facility] None    Jonah Blueeborah Johnson, MD, FACP

## 2017-01-27 ENCOUNTER — Ambulatory Visit (HOSPITAL_COMMUNITY)
Admission: RE | Admit: 2017-01-27 | Discharge: 2017-01-27 | Disposition: A | Discharge status: Home / Self Care | Payer: Self-pay | Source: Ambulatory Visit | Attending: Internal Medicine | Admitting: Internal Medicine

## 2017-01-27 DIAGNOSIS — N859 Noninflammatory disorder of uterus, unspecified: Secondary | ICD-10-CM | POA: Insufficient documentation

## 2017-01-27 DIAGNOSIS — R198 Other specified symptoms and signs involving the digestive system and abdomen: Secondary | ICD-10-CM | POA: Insufficient documentation

## 2017-01-27 DIAGNOSIS — R1900 Intra-abdominal and pelvic swelling, mass and lump, unspecified site: Secondary | ICD-10-CM | POA: Insufficient documentation

## 2017-01-28 ENCOUNTER — Telehealth: Payer: Self-pay

## 2017-01-28 NOTE — Telephone Encounter (Signed)
Contacted pt to go over US results pt didn't answer lvm asking pt to give me a call at her earliest convenience   If pt calls back please give results: pelvic ultrasound revealed normal-appearing ovaries. She has a small fibroid in the uterus which we do not have to do anything about since she is postmenopausal.

## 2017-02-03 ENCOUNTER — Ambulatory Visit (INDEPENDENT_AMBULATORY_CARE_PROVIDER_SITE_OTHER): Payer: No Typology Code available for payment source | Admitting: Psychiatry

## 2017-02-03 ENCOUNTER — Encounter (HOSPITAL_COMMUNITY): Payer: Self-pay | Admitting: Psychiatry

## 2017-02-03 VITALS — BP 132/88 | HR 65 | Ht 61.0 in | Wt 166.0 lb

## 2017-02-03 DIAGNOSIS — G4700 Insomnia, unspecified: Secondary | ICD-10-CM

## 2017-02-03 DIAGNOSIS — M1990 Unspecified osteoarthritis, unspecified site: Secondary | ICD-10-CM

## 2017-02-03 DIAGNOSIS — Z62810 Personal history of physical and sexual abuse in childhood: Secondary | ICD-10-CM

## 2017-02-03 DIAGNOSIS — M2550 Pain in unspecified joint: Secondary | ICD-10-CM

## 2017-02-03 DIAGNOSIS — G8929 Other chronic pain: Secondary | ICD-10-CM

## 2017-02-03 DIAGNOSIS — B1920 Unspecified viral hepatitis C without hepatic coma: Secondary | ICD-10-CM

## 2017-02-03 DIAGNOSIS — F39 Unspecified mood [affective] disorder: Secondary | ICD-10-CM

## 2017-02-03 DIAGNOSIS — F1910 Other psychoactive substance abuse, uncomplicated: Secondary | ICD-10-CM

## 2017-02-03 DIAGNOSIS — R454 Irritability and anger: Secondary | ICD-10-CM

## 2017-02-03 DIAGNOSIS — F1210 Cannabis abuse, uncomplicated: Secondary | ICD-10-CM

## 2017-02-03 DIAGNOSIS — F3111 Bipolar disorder, current episode manic without psychotic features, mild: Secondary | ICD-10-CM

## 2017-02-03 DIAGNOSIS — M549 Dorsalgia, unspecified: Secondary | ICD-10-CM

## 2017-02-03 MED ORDER — LAMOTRIGINE 25 MG PO TABS: ORAL_TABLET | ORAL | 0 refills | Status: DC

## 2017-02-03 MED FILL — lamoTRIgine 25 MG TABS: 25 | 30 days supply | Qty: 60 | Fill #0
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
## 2017-02-16 ENCOUNTER — Ambulatory Visit (HOSPITAL_COMMUNITY): Payer: Self-pay | Admitting: Licensed Clinical Social Worker

## 2017-02-16 ENCOUNTER — Ambulatory Visit: Payer: Self-pay | Attending: Internal Medicine

## 2017-02-18 ENCOUNTER — Ambulatory Visit: Payer: Self-pay | Attending: Internal Medicine | Admitting: *Deleted

## 2017-02-18 VITALS — BP 103/65 | HR 65 | Temp 98.6°F

## 2017-02-18 DIAGNOSIS — R21 Rash and other nonspecific skin eruption: Secondary | ICD-10-CM

## 2017-02-18 NOTE — Patient Instructions (Addendum)
A culture was completed today to determine the source of the skin concern. Please use the ointment prescribed as directed. Return to the clinic for any concerns

## 2017-02-21 MED FILL — TRIAMCINOLONE 0.1% OINTMENT: 0.1 | 5 days supply | Qty: 15 | Fill #0

## 2017-02-23 ENCOUNTER — Telehealth: Payer: Self-pay | Admitting: Internal Medicine

## 2017-02-23 NOTE — Telephone Encounter (Signed)
Pt came to speak with a nurse for test results from the dermatologist clinic

## 2017-02-23 NOTE — Telephone Encounter (Addendum)
Results from test are not in at this time. She will be notified once provider reviews test.

## 2017-02-24 ENCOUNTER — Encounter (HOSPITAL_COMMUNITY): Payer: Self-pay | Admitting: Psychiatry

## 2017-02-24 ENCOUNTER — Ambulatory Visit (INDEPENDENT_AMBULATORY_CARE_PROVIDER_SITE_OTHER): Payer: No Typology Code available for payment source | Admitting: Psychiatry

## 2017-02-24 VITALS — BP 138/80 | HR 85 | Ht 61.0 in | Wt 165.8 lb

## 2017-02-24 DIAGNOSIS — M2550 Pain in unspecified joint: Secondary | ICD-10-CM

## 2017-02-24 DIAGNOSIS — R443 Hallucinations, unspecified: Secondary | ICD-10-CM

## 2017-02-24 DIAGNOSIS — F1210 Cannabis abuse, uncomplicated: Secondary | ICD-10-CM

## 2017-02-24 DIAGNOSIS — M549 Dorsalgia, unspecified: Secondary | ICD-10-CM

## 2017-02-24 DIAGNOSIS — F3111 Bipolar disorder, current episode manic without psychotic features, mild: Secondary | ICD-10-CM

## 2017-02-24 DIAGNOSIS — Z62810 Personal history of physical and sexual abuse in childhood: Secondary | ICD-10-CM

## 2017-02-24 MED ORDER — LAMOTRIGINE 100 MG PO TABS: 100.0000 mg | ORAL_TABLET | Freq: Every day | ORAL | 0 refills | Status: DC

## 2017-02-24 MED FILL — lamoTRIgine 100 MG TABS: 100 | 30 days supply | Qty: 30 | Fill #0

## 2017-02-24 NOTE — Telephone Encounter (Signed)
Patient needs to contact stephanie who schedules there appointment to be provided the results.
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
## 2017-02-24 NOTE — Telephone Encounter (Signed)
Pt informed that Dr. Greer Pickerel will inform patient of results. If she has any questions she should contact Judeth Cornfield, individual that oversees the clinics. Pt verbalized understanding. She states she has an appointment on Friday at 2:30.

## 2017-03-03 ENCOUNTER — Ambulatory Visit (INDEPENDENT_AMBULATORY_CARE_PROVIDER_SITE_OTHER): Payer: No Typology Code available for payment source | Admitting: Licensed Clinical Social Worker

## 2017-03-03 ENCOUNTER — Encounter (HOSPITAL_COMMUNITY): Payer: Self-pay | Admitting: Licensed Clinical Social Worker

## 2017-03-03 DIAGNOSIS — F3111 Bipolar disorder, current episode manic without psychotic features, mild: Secondary | ICD-10-CM
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
## 2017-03-03 NOTE — Progress Notes (Signed)
   THERAPIST PROGRESS NOTE  Session Time: 9:00am-10:00am  Participation Level: Active  Behavioral Response: NeatAlertEuphoric  Type of Therapy: Individual Therapy  Treatment Goals addressed: Anger and Diagnosis: Bipolar Affective Disorder, currently manic, mild  Interventions: CBT and Motivational Interviewing  Summary: Rachel Hudson is a 56 y.o. female who presents with Bipolar Affective Disorder, currently manic, mild.   Suicidal/Homicidal: Nowithout intent/plan  Therapist Response: Rachel Hudson engaged well in CCA. Rachel Hudson identified high levels of hyperactivity and anger as stressors in her life. Rachel Hudson did not sit still for the entire session, bouncing in her seat, kicking legs moving arms. Rachel Hudson reports she has been diagnosed with Bipolar Disorder her entire life and has been in treatment off and on since age 58. Rachel Hudson has a great deal of trauma in her history. However, she reports no interest in discussing the past, as she has "gotten over it" and she feels like it happened to "someone else.   Plan: Return again in 2-3 weeks.  Diagnosis: Axis I: Bipolar Affective Disorder, currently manic, mild        Veneda Melter, LCSW 03/03/2017

## 2017-03-04 ENCOUNTER — Ambulatory Visit: Payer: Self-pay | Attending: Internal Medicine | Admitting: *Deleted

## 2017-03-04 VITALS — BP 109/72 | HR 85 | Temp 98.4°F | Resp 16 | Wt 166.8 lb

## 2017-03-04 DIAGNOSIS — Z09 Encounter for follow-up examination after completed treatment for conditions other than malignant neoplasm: Secondary | ICD-10-CM

## 2017-03-04 NOTE — Progress Notes (Signed)
Pt arrived to [facility] for f/u OV 02/18/2017. Called [facility] for results of pending culture on 02/18/2017 Spoke to [employer], [place].

## 2017-03-08 MED FILL — GABAPENTIN 800 MG TABLET: 800 | 30 days supply | Qty: 135 | Fill #4

## 2017-03-09 LAB — ANAEROBIC AND AEROBIC CULTURE

## 2017-03-14 MED FILL — VOLTAREN 1% GEL: 1 | 25 days supply | Qty: 100 | Fill #1

## 2017-03-21 ENCOUNTER — Ambulatory Visit (HOSPITAL_COMMUNITY): Payer: Self-pay | Admitting: Licensed Clinical Social Worker

## 2017-03-22 ENCOUNTER — Ambulatory Visit: Payer: Self-pay | Attending: Internal Medicine

## 2017-03-25 ENCOUNTER — Ambulatory Visit (HOSPITAL_COMMUNITY): Payer: Self-pay | Admitting: Psychiatry

## 2017-04-05 ENCOUNTER — Encounter: Payer: Self-pay | Admitting: Internal Medicine

## 2017-04-05 ENCOUNTER — Ambulatory Visit: Payer: Self-pay | Attending: Internal Medicine | Admitting: Internal Medicine

## 2017-04-05 VITALS — BP 109/66 | HR 59 | Temp 98.0°F | Resp 16 | Wt 169.0 lb

## 2017-04-05 DIAGNOSIS — G5793 Unspecified mononeuropathy of bilateral lower limbs: Secondary | ICD-10-CM

## 2017-04-05 DIAGNOSIS — G629 Polyneuropathy, unspecified: Secondary | ICD-10-CM | POA: Insufficient documentation

## 2017-04-05 DIAGNOSIS — I10 Essential (primary) hypertension: Secondary | ICD-10-CM

## 2017-04-05 DIAGNOSIS — R001 Bradycardia, unspecified: Secondary | ICD-10-CM | POA: Insufficient documentation

## 2017-04-05 DIAGNOSIS — M545 Low back pain: Secondary | ICD-10-CM | POA: Insufficient documentation

## 2017-04-05 DIAGNOSIS — Z79899 Other long term (current) drug therapy: Secondary | ICD-10-CM | POA: Insufficient documentation

## 2017-04-05 DIAGNOSIS — M7989 Other specified soft tissue disorders: Secondary | ICD-10-CM | POA: Insufficient documentation

## 2017-04-05 DIAGNOSIS — K5730 Diverticulosis of large intestine without perforation or abscess without bleeding: Secondary | ICD-10-CM | POA: Insufficient documentation

## 2017-04-05 DIAGNOSIS — B1920 Unspecified viral hepatitis C without hepatic coma: Secondary | ICD-10-CM | POA: Insufficient documentation

## 2017-04-05 DIAGNOSIS — G5600 Carpal tunnel syndrome, unspecified upper limb: Secondary | ICD-10-CM | POA: Insufficient documentation

## 2017-04-05 DIAGNOSIS — F319 Bipolar disorder, unspecified: Secondary | ICD-10-CM | POA: Insufficient documentation

## 2017-04-05 DIAGNOSIS — Z981 Arthrodesis status: Secondary | ICD-10-CM | POA: Insufficient documentation

## 2017-04-05 DIAGNOSIS — Z880 Allergy status to penicillin: Secondary | ICD-10-CM | POA: Insufficient documentation

## 2017-04-05 DIAGNOSIS — K219 Gastro-esophageal reflux disease without esophagitis: Secondary | ICD-10-CM | POA: Insufficient documentation

## 2017-04-05 DIAGNOSIS — G8929 Other chronic pain: Secondary | ICD-10-CM | POA: Insufficient documentation

## 2017-04-05 NOTE — Progress Notes (Signed)
Patient ID: Rachel Hudson, female    DOB: [birth year]  MRN: [id number]  CC: Referral   Subjective: Rachel Hudson is a 56 y.o. female who presents for chronic ds managment Her concerns today include: Patient with history of HTN, hepatitis C (treated) with cirrhosis followed by GI, GERD and chronic lower back pain with recent surgery for spinal fixation of L4-L5.   1. Bipolar: she was seeing psychiatrist Dr. Lolly MustacheArfeen. stopped going because she did not feel the medication was helping.  "I terminated that service."  2. HTN: compliant with Norvasc and limits salt  3. C/o burning  and swelling in feet. Worse with walking. Started several days after lower back surgery in 08/2016. States she told Dr. Samuella CotaPrice about it on f/u appt but he did not think it was anything to be concerned about -on Gabapentin before surgery for her back. Still taking -not able to exercise as much due to symptoms. Rides a stationary bike.  -checked for DM in past Requesting referral to see a podiatrist  Patient Active Problem List   Diagnosis Date Noted  . GERD (gastroesophageal reflux disease) 08/09/2016  . Abdominal pain 08/09/2016  . Sinus bradycardia 06/15/2016  . Gingivitis 06/15/2016  . ASCUS favor benign 11/13/2015  . History of hepatitis C 10/13/2015  . Diverticulosis of colon without hemorrhage   . Peptic ulcer   . Peptic ulcer disease   . 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 Lumbago 11/18/2014  . Carpal tunnel syndrome 11/18/2014  . Hepatic cirrhosis (HCC) 08/06/2014  . HTN (hypertension) 02/28/2014     Current Outpatient Medications on File Prior to Visit  Medication Sig Dispense Refill  . amLODipine (NORVASC) 10 MG tablet Take 1 tablet (10 mg total) by mouth daily. 90 tablet 3  . gabapentin (NEURONTIN) 800 MG tablet Take 1.5 tablets (1,200 mg total) by mouth 3 (three) times daily. 135 tablet 5  . lamoTRIgine (LAMICTAL) 100 MG tablet Take 1 tablet (100  mg total) by mouth daily. 30 tablet 0  . Lecith-Inosi-Chol-B12-Liver (LIVERITE) 1.125 MCG TABS Take by mouth 2 (two) times daily.    . milk thistle 175 MG tablet Take 175 mg by mouth daily.    . Multiple Vitamin (MULTIVITAMIN WITH MINERALS) TABS tablet Take 1 tablet by mouth daily.    . Omega-3 Fatty Acids (FISH OIL) 1200 MG CAPS Take by mouth.     No current facility-administered medications on file prior to visit.     Allergies  Allergen Reactions  . Penicillins     Facial swelling Has patient had a PCN reaction causing immediate rash, facial/tongue/throat swelling, SOB or lightheadedness with hypotension: Yes Has patient had a PCN reaction causing severe rash involving mucus membranes or skin necrosis: No Has patient had a PCN reaction that required hospitalization No Has patient had a PCN reaction occurring within the last 10 years: Yes If all of the above answers are "NO", then may proceed with Cephalosporin use.   . Tylenol [Acetaminophen] Other (See Comments)    HX of Hep. C    Social History   Socioeconomic History  . Marital status: Single    Spouse name: Not on file  . Number of children: Not on file  . Years of education: Not on file  . Highest education level: Not on file  Social Needs  . Financial resource strain: Not on file  . Food insecurity - worry: Not on file  . Food insecurity - inability: Not  on file  . Transportation needs - medical: Not on file  . Transportation needs - non-medical: Not on file  Occupational History  . Not on file  Tobacco Use  . Smoking status: Never Smoker  . Smokeless tobacco: Never Used  Substance and Sexual Activity  . Alcohol use: No    Alcohol/week: 0.0 oz    Comment: occasionally  . Drug use: Yes    Frequency: 7.0 times per week    Types: Marijuana  . Sexual activity: Yes    Birth control/protection: None  Other Topics Concern  . Not on file  Social History Narrative  . Not on file    Family History  Adopted:  Yes  Family history unknown: Yes    Past Surgical History:  Procedure Laterality Date  . BACK SURGERY    . LIGAMENT REPAIR Right 1980   Knee    ROS: Review of Systems Neg except as above  PHYSICAL EXAM: BP 109/66   Pulse (!) 59   Temp 98 F (36.7 C) (Oral)   Resp 16   Wt 169 lb (76.7 kg)   LMP 03/01/2016   SpO2 96%   BMI 31.93 kg/m   Wt Readings from Last 3 Encounters:  04/05/17 169 lb (76.7 kg)  03/04/17 166 lb 12.8 oz (75.7 kg)  01/21/17 168 lb (76.2 kg)   Physical Exam General appearance - alert, well appearing, and in no distress Mental status - alert, oriented to person, place, and time, normal mood, behavior, speech, dress, motor activity, and thought processes Chest - clear to auscultation, no wheezes, rales or rhonchi, symmetric air entry Heart - normal rate, regular rhythm, normal S1, S2, no murmurs, rubs, clicks or gallops Neurological - LEAP: dec sensation on dorsum and soles. No callous. DP and PT pulses are normal  ASSESSMENT AND PLAN: 1. Essential hypertension At goal Continue Norvasc  2. Neuropathy of both feet I think symptoms likely due to prior back surgery and she may have to live with this unfortunately. She is on very high dose Gabapentin. Could consider switch to Lyrica. I recommend that she see Dr. Samuella CotaPrice in f/u but pt insisting that she would like to see a podiatrist first.  - Ambulatory referral to Podiatry -check A1C and Vit B12  Patient was given the opportunity to ask questions.  Patient verbalized understanding of the plan and was able to repeat key elements of the plan.   No orders of the defined types were placed in this encounter.    Requested Prescriptions    No prescriptions requested or ordered in this encounter    No Follow-up on file.  Jonah Blueeborah Johnson, MD, FACP

## 2017-04-06 ENCOUNTER — Ambulatory Visit: Payer: Self-pay | Attending: Internal Medicine

## 2017-04-06 DIAGNOSIS — G5793 Unspecified mononeuropathy of bilateral lower limbs: Secondary | ICD-10-CM

## 2017-04-07 ENCOUNTER — Telehealth: Payer: Self-pay | Admitting: *Deleted

## 2017-04-07 LAB — VITAMIN B12: VITAMIN B 12: 770 pg/mL (ref 232–1245)

## 2017-04-07 LAB — HEMOGLOBIN A1C
Est. average glucose Bld gHb Est-mCnc: 108 mg/dL
Hgb A1c MFr Bld: 5.4 % (ref 4.8–5.6)

## 2017-04-07 NOTE — Telephone Encounter (Signed)
-----   Message from Marcine Matareborah B Johnson, MD sent at 04/07/2017  8:37 AM EST ----- Let pt know that DM screen was negative. Vit B12 level is good.

## 2017-04-07 NOTE — Telephone Encounter (Signed)
Patient verified DOB Patient is aware of EDM being negative and B12 levels being good. No further questions at this time.

## 2017-04-13 MED FILL — AMLODIPINE BESYLATE 10 MG T: 10 | 30 days supply | Qty: 30 | Fill #1

## 2017-04-13 MED FILL — GABAPENTIN 800 MG TABLET: 800 | 30 days supply | Qty: 135 | Fill #5

## 2017-04-18 ENCOUNTER — Ambulatory Visit: Payer: No Typology Code available for payment source | Admitting: Podiatry

## 2017-04-18 ENCOUNTER — Encounter: Payer: Self-pay | Admitting: Podiatry

## 2017-04-18 ENCOUNTER — Ambulatory Visit: Payer: No Typology Code available for payment source

## 2017-04-18 DIAGNOSIS — M779 Enthesopathy, unspecified: Secondary | ICD-10-CM

## 2017-04-18 DIAGNOSIS — M778 Other enthesopathies, not elsewhere classified: Secondary | ICD-10-CM

## 2017-04-18 DIAGNOSIS — M7751 Other enthesopathy of right foot: Secondary | ICD-10-CM

## 2017-04-18 DIAGNOSIS — M79672 Pain in left foot: Principal | ICD-10-CM

## 2017-04-18 DIAGNOSIS — M7752 Other enthesopathy of left foot: Secondary | ICD-10-CM

## 2017-04-18 DIAGNOSIS — M79671 Pain in right foot: Secondary | ICD-10-CM

## 2017-04-18 DIAGNOSIS — M5415 Radiculopathy, thoracolumbar region: Secondary | ICD-10-CM

## 2017-04-19 ENCOUNTER — Other Ambulatory Visit: Payer: Self-pay | Admitting: Podiatry

## 2017-04-19 DIAGNOSIS — M779 Enthesopathy, unspecified: Principal | ICD-10-CM

## 2017-04-19 DIAGNOSIS — M778 Other enthesopathies, not elsewhere classified: Secondary | ICD-10-CM

## 2017-04-21 NOTE — Progress Notes (Signed)
   HPI: 56 year old female presenting today as a new patient with a complaint of posterior right heel pain that has been present for several months. She also reports a burning sensation to the plantar aspects of bilateral feet. She reports pain to the 1st MPJs of bilateral feet as well. She has been taking Gabapentin which helps alleviate some of her symptoms. She denies modifying factors. Patient reports h/o back surgery and states the pain in her feet worsened after this operation. Patient is here for further evaluation and treatment.    Past Medical History:  Diagnosis Date  . Cirrhosis of liver [facility](HCC) September 2015   Stage 4  . GERD (gastroesophageal reflux disease)   . Hep C w/o coma, chronic (HCC) As of 10/22/13  . Hypertension      Physical Exam: General: The patient is alert and oriented x3 in no acute distress.  Dermatology: Skin is warm, dry and supple bilateral lower extremities. Negative for open lesions or macerations.  Vascular: Palpable pedal pulses bilaterally. No edema or erythema noted. Capillary refill within normal limits.  Neurological: Epicritic and protective threshold grossly intact bilaterally.   Musculoskeletal Exam: Pain with palpation to the 1st MPJs of bilateral feet. Range of motion within normal limits to all pedal and ankle joints bilateral. Muscle strength 5/5 in all groups bilateral.   Radiographic Exam:  Normal osseous mineralization. Joint spaces preserved. No fracture/dislocation/boney destruction.    Assessment: - 1st MPJ capsulitis bilaterally - lumbar radiculopathy    Plan of Care:  - Patient evaluated. X-Rays reviewed. - Injection of 0.5 mLs Celestone Soluspan injected into the 1st MPJ of bilateral feet. - Recommended good shoe gear. - Informed patient that her burning sensation in the feet is likely coming from her lower lumbar region.  - Return to clinic when necessary.    Felecia ShellingBrent M. Adream Parzych, DPM [facility]  Dr. Felecia ShellingBrent M.  Eunice Oldaker, DPM    [address]                                        [place], [place] [address]                Office [phone]  Fax [phone]) [phone]

## 2017-05-12 MED FILL — AMLODIPINE BESYLATE 10 MG T: 10 | 30 days supply | Qty: 30 | Fill #2

## 2017-05-18 ENCOUNTER — Other Ambulatory Visit: Payer: Self-pay | Admitting: Internal Medicine

## 2017-05-18 DIAGNOSIS — M47816 Spondylosis without myelopathy or radiculopathy, lumbar region: Secondary | ICD-10-CM

## 2017-05-18 DIAGNOSIS — G5603 Carpal tunnel syndrome, bilateral upper limbs: Secondary | ICD-10-CM

## 2017-05-18 MED FILL — ?OMEPRAZOLE DR 20MG CAPSULE: 20 | 30 days supply | Qty: 30 | Fill #2

## 2017-05-19 MED FILL — GABAPENTIN 800 MG TABLET: 800 | 30 days supply | Qty: 135 | Fill #0

## 2017-05-27 ENCOUNTER — Encounter: Payer: Self-pay | Admitting: Internal Medicine

## 2017-06-06 ENCOUNTER — Ambulatory Visit: Payer: No Typology Code available for payment source | Admitting: Podiatry

## 2017-06-06 ENCOUNTER — Encounter: Payer: Self-pay | Admitting: Podiatry

## 2017-06-06 DIAGNOSIS — M722 Plantar fascial fibromatosis: Secondary | ICD-10-CM

## 2017-06-06 MED ORDER — METHYLPREDNISOLONE 4 MG PO TBPK
ORAL_TABLET | ORAL | 0 refills | Status: DC
Start: 2017-06-06 — End: 2017-08-24

## 2017-06-06 MED ORDER — MELOXICAM 15 MG PO TABS: 15.0000 mg | ORAL_TABLET | Freq: Every day | ORAL | 1 refills | Status: AC

## 2017-06-06 MED FILL — MELOXICAM 15 MG TABLET: 15 | 30 days supply | Qty: 30 | Fill #0

## 2017-06-06 MED FILL — ?METHYLPREDNISOLONE 4 MG TA: 4 | 6 days supply | Qty: 21 | Fill #0

## 2017-06-08 NOTE — Progress Notes (Signed)
   Subjective: Patient presents today for pain and tenderness in the feet bilaterally. Patient states the foot pain has been present for several weeks now. Patient states that it hurts in the morning with the first steps out of bed.  There are no alleviating factors noted.  Patient presents today for further treatment and evaluation.   Past Medical History:  Diagnosis Date  . Cirrhosis of liver [facility](HCC) September 2015   Stage 4  . GERD (gastroesophageal reflux disease)   . Hep C w/o coma, chronic (HCC) As of 10/22/13  . Hypertension      Objective: Physical Exam General: The patient is alert and oriented x3 in no acute distress.  Dermatology: Skin is warm, dry and supple bilateral lower extremities. Negative for open lesions or macerations bilateral.   Vascular: Dorsalis Pedis and Posterior Tibial pulses palpable bilateral.  Capillary fill time is immediate to all digits.  Neurological: Epicritic and protective threshold intact bilateral.   Musculoskeletal: Tenderness to palpation at the medial calcaneal tubercale and through the insertion of the plantar fascia of the bilateral feet. All other joints range of motion within normal limits bilateral. Strength 5/5 in all groups bilateral.    Assessment: 1. plantar fasciitis bilateral feet  Plan of Care:  1. Patient evaluated. 2. Injection of 0.5cc Celestone soluspan injected into the bilateral heels.  3. Rx for Medrol Dose Pak placed 4. Rx for meloxicam ordered for patient. 5. Continue wearing good shoe gear. 6. Instructed patient regarding therapies and modalities at home to alleviate symptoms.  7. Return to clinic as needed.  Felecia ShellingBrent M. Shakeda Pearse, DPM [facility]  Dr. Felecia ShellingBrent M. Jonella Redditt, DPM    [address]                                   [place], [place] [address]                Office [phone]  Fax [phone]

## 2017-06-13 MED FILL — AMLODIPINE BESYLATE 10 MG T: 10 | 30 days supply | Qty: 30 | Fill #3

## 2017-06-15 MED FILL — GABAPENTIN 800 MG TABLET: 800 | 30 days supply | Qty: 135 | Fill #1

## 2017-06-17 ENCOUNTER — Ambulatory Visit: Payer: Self-pay | Admitting: Internal Medicine

## 2017-06-23 ENCOUNTER — Ambulatory Visit: Payer: Self-pay | Attending: Internal Medicine | Admitting: Physician Assistant

## 2017-06-23 VITALS — BP 115/72 | HR 57 | Temp 98.1°F | Resp 16 | Ht 61.0 in | Wt 174.4 lb

## 2017-06-23 DIAGNOSIS — H9191 Unspecified hearing loss, right ear: Secondary | ICD-10-CM

## 2017-06-23 DIAGNOSIS — Z79899 Other long term (current) drug therapy: Secondary | ICD-10-CM | POA: Insufficient documentation

## 2017-06-23 DIAGNOSIS — H6981 Other specified disorders of Eustachian tube, right ear: Secondary | ICD-10-CM | POA: Insufficient documentation

## 2017-06-23 DIAGNOSIS — B182 Chronic viral hepatitis C: Secondary | ICD-10-CM | POA: Insufficient documentation

## 2017-06-23 DIAGNOSIS — K7460 Unspecified cirrhosis of liver: Secondary | ICD-10-CM | POA: Insufficient documentation

## 2017-06-23 DIAGNOSIS — I10 Essential (primary) hypertension: Secondary | ICD-10-CM | POA: Insufficient documentation

## 2017-06-23 DIAGNOSIS — H918X1 Other specified hearing loss, right ear: Secondary | ICD-10-CM | POA: Insufficient documentation

## 2017-06-23 DIAGNOSIS — K219 Gastro-esophageal reflux disease without esophagitis: Secondary | ICD-10-CM | POA: Insufficient documentation

## 2017-06-23 MED ORDER — FLUTICASONE PROPIONATE 50 MCG/ACT NA SUSP: 2.0000 | Freq: Every day | NASAL | 6 refills | Status: DC

## 2017-06-23 MED FILL — FLUTICASONE PROP 50 MCG SPR: 50 | 30 days supply | Qty: 16 | Fill #0

## 2017-06-23 NOTE — Progress Notes (Signed)
Rachel PineFrances Hudson, is a 57 y.o. female  HQI:[id number]  XLK:[id number]  DOB - [birth year]  Subjective:  Chief Complaint and HPI: Rachel PineFrances Hudson is a 57 y.o. female here today for decreased hearing in R ear since a loud phone call about 3 weks ago.  Her ear has felt irritated since then.  SHe admits to using Qtips.  Not really painful.  No drainage.    Compliant with BP meds.  No CP/HA/dizziness.  ROS:   Constitutional:  No f/c, No night sweats, No unexplained weight loss. EENT:  No vision changes, No blurry vision, No hearing changes. No other mouth, throat, or ear problems.  Respiratory: No cough, No SOB Cardiac: No CP, no palpitations GI:  No abd pain, No N/V/D. GU: No Urinary s/sx Musculoskeletal: No joint pain Neuro: No headache, no dizziness, no motor weakness.  Skin: No rash Endocrine:  No polydipsia. No polyuria.  Psych: Denies SI/HI  No problems updated.  ALLERGIES: Allergies  Allergen Reactions  . Penicillins     Facial swelling Has patient had a PCN reaction causing immediate rash, facial/tongue/throat swelling, SOB or lightheadedness with hypotension: Yes Has patient had a PCN reaction causing severe rash involving mucus membranes or skin necrosis: No Has patient had a PCN reaction that required hospitalization No Has patient had a PCN reaction occurring within the last 10 years: Yes If all of the above answers are "NO", then may proceed with Cephalosporin use.   . Tylenol [Acetaminophen] Other (See Comments)    HX of Hep. C    PAST MEDICAL HISTORY: Past Medical History:  Diagnosis Date  . 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 GERD (gastroesophageal reflux disease)   . Hep C w/o coma, chronic (HCC) As of 10/22/13  . Hypertension     MEDICATIONS AT HOME: Prior to Admission medications   Medication Sig Start Date End Date Taking? Authorizing Provider  amLODipine (NORVASC) 10 MG tablet Take 1 tablet (10 mg total) by mouth daily. 11/19/16  Yes  Marcine MatarJohnson, Deborah B, MD  gabapentin (NEURONTIN) 800 MG tablet TAKE 1 & 1/2 TABLETS BY MOUTH 3 TIMES DAILY 05/19/17  Yes Marcine MatarJohnson, Deborah B, MD  Lecith-Inosi-Chol-B12-Liver (LIVERITE) 1.125 MCG TABS Take by mouth 2 (two) times daily.   Yes [provider]  meloxicam (MOBIC) 15 MG tablet Take 1 tablet (15 mg total) by mouth daily. 06/06/17 07/06/17 Yes Felecia ShellingEvans, Brent M, DPM  milk thistle 175 MG tablet Take 175 mg by mouth daily.   Yes [provider]  Multiple Vitamin (MULTIVITAMIN WITH MINERALS) TABS tablet Take 1 tablet by mouth daily.   Yes [provider]  Omega-3 Fatty Acids (FISH OIL) 1200 MG CAPS Take by mouth.   Yes [provider]  fluticasone (FLONASE) 50 MCG/ACT nasal spray Place 2 sprays into both nostrils daily. 06/23/17   Anders SimmondsMcClung, Jaxsin Bottomley M, PA-C  methylPREDNISolone (MEDROL DOSEPAK) 4 MG TBPK tablet 6 day dose pack - take as directed Patient not taking: Reported on 06/23/2017 06/06/17   Felecia ShellingEvans, Brent M, DPM     Objective:  EXAM:   Vitals:   06/23/17 0853  BP: 115/72  Pulse: (!) 57  Resp: 16  Temp: 98.1 F (36.7 C)  TempSrc: Oral  SpO2: 97%  Weight: 174 lb 6.4 oz (79.1 kg)  Height: 5\' 1"  (1.549 m)    General appearance : A&OX3. NAD. Non-toxic-appearing HEENT: Atraumatic and Normocephalic.  PERRLA. EOM intact.  TM full on R but intact and bulging a  little.  There is a small amount of cerumen pressing against the inferior aspect of the TM.  No erythema or evidence of infection or trauma otherwise.  Mouth-MMM, post pharynx WNL w/o erythema, No PND. Neck: supple, no JVD. No cervical lymphadenopathy. No thyromegaly Chest/Lungs:  Breathing-non-labored, Good air entry bilaterally, breath sounds normal without rales, rhonchi, or wheezing  CVS: S1 S2 regular, no murmurs, gallops, rubs  Extremities: Bilateral Lower Ext shows no edema, both legs are warm to touch with = pulse throughout Neurology:  CN II-XII grossly intact, Non focal.   Psych:  TP  linear. J/I WNL. Normal speech. Appropriate eye contact and affect.  Skin:  No Rash  Data Review Lab Results  Component Value Date   HGBA1C 5.4 04/06/2017   HGBA1C 5.7 (H) 09/28/2013     Assessment & Plan   1. Eustachian tube dysfunction, right - fluticasone (FLONASE) 50 MCG/ACT nasal spray; Place 2 sprays into both nostrils daily.  Dispense: 16 g; Refill: 6  2. Decreased hearing of right ear Small amount of cerumen up against the TM-not able to use cerumen scoop without possibility of damage.  She is very sensitive just with otoscopic exam and not comfortable to attempt irrigation at this time.  - Ambulatory referral to ENT - fluticasone (FLONASE) 50 MCG/ACT nasal spray; Place 2 sprays into both nostrils daily.  Dispense: 16 g; Refill: 6  3. Hypertension, unspecified type Controlled-continue current regimen.     Patient have been counseled extensively about nutrition and exercise  Return in about 2 months (around 08/21/2017) for Dr Laural Benes for BP and other issues.  The patient was given clear instructions to go to ER or return to medical center if symptoms don't improve, worsen or new problems develop. The patient verbalized understanding. The patient was told to call to get lab results if they haven't heard anything in the next week.     Georgian Co, PA-C [facility] [place], [place] [phone]   06/23/2017, 9:07 AM

## 2017-06-23 NOTE — Progress Notes (Signed)
Patient is here for her right ear not being able to hear well for 3 weeks now.   Patient stated it feels like water sitting in her ear.

## 2017-06-24 ENCOUNTER — Ambulatory Visit: Payer: Self-pay | Admitting: Internal Medicine

## 2017-06-27 ENCOUNTER — Ambulatory Visit: Payer: Self-pay | Attending: Internal Medicine

## 2017-07-11 ENCOUNTER — Other Ambulatory Visit: Payer: Self-pay | Admitting: Internal Medicine

## 2017-07-18 MED FILL — AMLODIPINE BESYLATE 10 MG T: 10 | 30 days supply | Qty: 30 | Fill #4

## 2017-07-18 MED FILL — GABAPENTIN 800 MG TABLET: 800 | 30 days supply | Qty: 135 | Fill #2

## 2017-08-11 ENCOUNTER — Ambulatory Visit: Payer: Self-pay | Attending: Internal Medicine | Admitting: Internal Medicine

## 2017-08-11 ENCOUNTER — Encounter: Payer: Self-pay | Admitting: Internal Medicine

## 2017-08-11 VITALS — BP 115/69 | HR 53 | Temp 98.3°F | Resp 16 | Ht 61.0 in | Wt 172.2 lb

## 2017-08-11 DIAGNOSIS — G5603 Carpal tunnel syndrome, bilateral upper limbs: Secondary | ICD-10-CM | POA: Insufficient documentation

## 2017-08-11 DIAGNOSIS — M19041 Primary osteoarthritis, right hand: Secondary | ICD-10-CM | POA: Insufficient documentation

## 2017-08-11 DIAGNOSIS — K5730 Diverticulosis of large intestine without perforation or abscess without bleeding: Secondary | ICD-10-CM | POA: Insufficient documentation

## 2017-08-11 DIAGNOSIS — M722 Plantar fascial fibromatosis: Secondary | ICD-10-CM | POA: Insufficient documentation

## 2017-08-11 DIAGNOSIS — M47812 Spondylosis without myelopathy or radiculopathy, cervical region: Secondary | ICD-10-CM | POA: Insufficient documentation

## 2017-08-11 DIAGNOSIS — Z880 Allergy status to penicillin: Secondary | ICD-10-CM | POA: Insufficient documentation

## 2017-08-11 DIAGNOSIS — G629 Polyneuropathy, unspecified: Secondary | ICD-10-CM | POA: Insufficient documentation

## 2017-08-11 DIAGNOSIS — K219 Gastro-esophageal reflux disease without esophagitis: Secondary | ICD-10-CM | POA: Insufficient documentation

## 2017-08-11 DIAGNOSIS — R252 Cramp and spasm: Secondary | ICD-10-CM | POA: Insufficient documentation

## 2017-08-11 DIAGNOSIS — I10 Essential (primary) hypertension: Secondary | ICD-10-CM | POA: Insufficient documentation

## 2017-08-11 DIAGNOSIS — Z981 Arthrodesis status: Secondary | ICD-10-CM | POA: Insufficient documentation

## 2017-08-11 DIAGNOSIS — Z7952 Long term (current) use of systemic steroids: Secondary | ICD-10-CM | POA: Insufficient documentation

## 2017-08-11 DIAGNOSIS — G6289 Other specified polyneuropathies: Secondary | ICD-10-CM

## 2017-08-11 DIAGNOSIS — G8929 Other chronic pain: Secondary | ICD-10-CM | POA: Insufficient documentation

## 2017-08-11 DIAGNOSIS — Z886 Allergy status to analgesic agent status: Secondary | ICD-10-CM | POA: Insufficient documentation

## 2017-08-11 DIAGNOSIS — R001 Bradycardia, unspecified: Secondary | ICD-10-CM | POA: Insufficient documentation

## 2017-08-11 DIAGNOSIS — Z9889 Other specified postprocedural states: Secondary | ICD-10-CM | POA: Insufficient documentation

## 2017-08-11 DIAGNOSIS — Z79899 Other long term (current) drug therapy: Secondary | ICD-10-CM | POA: Insufficient documentation

## 2017-08-11 DIAGNOSIS — B1920 Unspecified viral hepatitis C without hepatic coma: Secondary | ICD-10-CM | POA: Insufficient documentation

## 2017-08-11 DIAGNOSIS — M47816 Spondylosis without myelopathy or radiculopathy, lumbar region: Secondary | ICD-10-CM | POA: Insufficient documentation

## 2017-08-11 DIAGNOSIS — M19042 Primary osteoarthritis, left hand: Secondary | ICD-10-CM | POA: Insufficient documentation

## 2017-08-11 MED ORDER — METHOCARBAMOL 500 MG PO TABS: 500.0000 mg | ORAL_TABLET | Freq: Every day | ORAL | 1 refills | Status: DC | PRN

## 2017-08-11 MED ORDER — DULOXETINE HCL 20 MG PO CPEP: 20.0000 mg | ORAL_CAPSULE | Freq: Every day | ORAL | 3 refills | Status: DC

## 2017-08-11 MED FILL — METHOCARBAMOL 500 MG TABLET: 500 | 30 days supply | Qty: 30 | Fill #0

## 2017-08-11 MED FILL — DULoxetine HCL 20 MG CPEP: 20 | 30 days supply | Qty: 30 | Fill #0

## 2017-08-11 NOTE — Progress Notes (Signed)
Patient ID: Rachel Hudson, female    DOB: 1961/01/11  MRN: [id number]  CC: Hypertension   Subjective: Rachel Hudson is a 57 y.o. female who presents for chronic ds management Her concerns today include:  Patient with history of HTN, hepatitis C (treated) with cirrhosis followed by GI, GERD and chronic lower back pain with recent surgery for spinal fixation of L4-L5.  1.  "My feet are gone.  My feet hurt so bad that I prefer to crawl" Saw podiatry, Dr. Logan BoresEvans, and was dx with plantar fasciitis.  Given steroid injections, Medrol pak and Meloxicam.  "It seems the more I try to walk, the worse it gets.  "Feels like I'm walking on hot coal;  my feet burn."  On max dose of Gabapentin -started using her wheel walker that she used after back surgery -Vitamin B12 level, TSH and A1c have been normal.  2.  CTS symptoms worse.  She is wearing splints on both hands today.  Had CT release BL about 1 yr ago.  Feels the surgeries did not work. Had steriod inj a few times afterwards.  Told she may need surgery again but she is not interested. Has difficulty using hands - holding dishes, has problems writing and even cleaning herself; spouse has to her with baths. - symptoms mainly are cramps in hands several x a day.  Fingers curl up.  Numbness about 3 x a wk. wears hand splints and uses a stress ball Reports she is strong will and "I refuse to get depress."  Patient Active Problem List   Diagnosis Date Noted  . Neuropathy of both feet 04/05/2017  . Status post lumbar spinal fusion 10/22/2016  . GERD (gastroesophageal reflux disease) 08/09/2016  . Spondylolisthesis of lumbar region 07/29/2016  . Sinus bradycardia 06/15/2016  . Gingivitis 06/15/2016  . ASCUS favor benign 11/13/2015  . History of hepatitis C 10/13/2015  . Bilateral carpal tunnel syndrome 09/16/2015  . Cervical spondylosis without myelopathy 09/16/2015  . Primary osteoarthritis of both hands 09/16/2015  . Diverticulosis of colon  without hemorrhage   . Peptic ulcer disease   . 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 Lumbago 11/18/2014  . Carpal tunnel syndrome 11/18/2014  . Hepatic cirrhosis (HCC) 08/06/2014  . HTN (hypertension) 02/28/2014     Current Outpatient Medications on File Prior to Visit  Medication Sig Dispense Refill  . amLODipine (NORVASC) 10 MG tablet Take 1 tablet (10 mg total) by mouth daily. 90 tablet 3  . fluticasone (FLONASE) 50 MCG/ACT nasal spray Place 2 sprays into both nostrils daily. 16 g 6  . gabapentin (NEURONTIN) 800 MG tablet TAKE 1 & 1/2 TABLETS BY MOUTH 3 TIMES DAILY 135 tablet 5  . Lecith-Inosi-Chol-B12-Liver (LIVERITE) 1.125 MCG TABS Take by mouth 2 (two) times daily.    . methylPREDNISolone (MEDROL DOSEPAK) 4 MG TBPK tablet 6 day dose pack - take as directed (Patient not taking: Reported on 06/23/2017) 21 tablet 0  . milk thistle 175 MG tablet Take 175 mg by mouth daily.    . Multiple Vitamin (MULTIVITAMIN WITH MINERALS) TABS tablet Take 1 tablet by mouth daily.    . Omega-3 Fatty Acids (FISH OIL) 1200 MG CAPS Take by mouth.     No current facility-administered medications on file prior to visit.     Allergies  Allergen Reactions  . Penicillins     Facial swelling Has patient had a PCN reaction causing immediate rash, facial/tongue/throat swelling, SOB or lightheadedness  with hypotension: Yes Has patient had a PCN reaction causing severe rash involving mucus membranes or skin necrosis: No Has patient had a PCN reaction that required hospitalization No Has patient had a PCN reaction occurring within the last 10 years: Yes If all of the above answers are "NO", then may proceed with Cephalosporin use.   . Tylenol [Acetaminophen] Other (See Comments)    HX of Hep. C    Social History   Socioeconomic History  . Marital status: Single    Spouse name: Not on file  . Number of children: Not on file  . Years of education: Not on file  .  Highest education level: Not on file  Occupational History  . Not on file  Social Needs  . Financial resource strain: Not on file  . Food insecurity:    Worry: Not on file    Inability: Not on file  . Transportation needs:    Medical: Not on file    Non-medical: Not on file  Tobacco Use  . Smoking status: Never Smoker  . Smokeless tobacco: Never Used  Substance and Sexual Activity  . Alcohol use: No    Alcohol/week: 0.0 oz    Comment: occasionally  . Drug use: Yes    Frequency: 7.0 times per week    Types: Marijuana  . Sexual activity: Yes    Birth control/protection: None  Lifestyle  . Physical activity:    Days per week: Not on file    Minutes per session: Not on file  . Stress: Not on file  Relationships  . Social connections:    Talks on phone: Not on file    Gets together: Not on file    Attends religious service: Not on file    Active member of club or organization: Not on file    Attends meetings of clubs or organizations: Not on file    Relationship status: Not on file  . Intimate partner violence:    Fear of current or ex partner: Not on file    Emotionally abused: Not on file    Physically abused: Not on file    Forced sexual activity: Not on file  Other Topics Concern  . Not on file  Social History Narrative  . Not on file    Family History  Adopted: Yes  Family history unknown: Yes    Past Surgical History:  Procedure Laterality Date  . BACK SURGERY    . CARPAL TUNNEL RELEASE Right 11/13/2015   Procedure: RIGHT CARPAL TUNNEL RELEASE;  Surgeon: Betha Loa, MD;  Location: [facility];  Service: Orthopedics;  Laterality: Right;  . CARPAL TUNNEL RELEASE Left 01/29/2016   Procedure: left CARPAL TUNNEL RELEASE;  Surgeon: Betha Loa, MD;  Location: [facility];  Service: Orthopedics;  Laterality: Left;  . COLONOSCOPY N/A 07/08/2015   Procedure: COLONOSCOPY;  Surgeon: Corbin Ade, MD;  [facility];  Service:  Endoscopy;  Laterality: N/A;  230  . ESOPHAGOGASTRODUODENOSCOPY N/A 07/08/2015   Procedure: ESOPHAGOGASTRODUODENOSCOPY (EGD);  Surgeon: Corbin Ade, MD;  [facility];  Service: Endoscopy;  Laterality: N/A;  . LIGAMENT REPAIR Right 1980   Knee    ROS: Review of Systems Negative except as above PHYSICAL EXAM: BP 115/69   Pulse (!) 53   Temp 98.3 F (36.8 C) (Oral)   Resp 16   Ht 5\' 1"  (1.549 m)   Wt 172 lb 3.2 oz (78.1 kg)   LMP 03/01/2016  SpO2 96%   BMI 32.54 kg/m   Wt Readings from Last 3 Encounters:  08/11/17 172 lb 3.2 oz (78.1 kg)  06/23/17 174 lb 6.4 oz (79.1 kg)  04/05/17 169 lb (76.7 kg)    Physical Exam  General appearance - alert, well appearing, and in no distress Mental status - alert, oriented to person, place, and time, normal mood, behavior, speech, dress, motor activity, and thought processes Neck - supple, no significant adenopathy Chest - clear to auscultation, no wheezes, rales or rhonchi, symmetric air entry Heart - normal rate, regular rhythm, normal S1, S2, no murmurs, rubs, clicks or gallops Musculoskeletal -hands: No signs of muscle wasting of interosseous muscles or over thenar eminence.  Grip 4/5 BL Ambulating with a rolling walker.  Has a knee pad over the left knee  Depression screen [facility] 2/9 11/19/2016 06/15/2016 04/13/2016  Decreased Interest 3 3 3   Down, Depressed, Hopeless 0 0 1  PHQ - 2 Score 3 3 4   Altered sleeping 2 1 2   Tired, decreased energy 3 2 2   Change in appetite 3 2 2   Feeling bad or failure about yourself  0 0 0  Trouble concentrating 3 3 2   Moving slowly or fidgety/restless 3 3 3   Suicidal thoughts 0 0 0  PHQ-9 Score 17 14 15     ASSESSMENT AND PLAN: 1. Other polyneuropathy Symptoms in her feet sounds more neuropathic and may be related to previous back surgery.  She is on maximum dose of gabapentin.  She is agreeable to adding low-dose of Cymbalta.  If still no improvement we will do EMG study. - DULoxetine  (CYMBALTA) 20 MG capsule; Take 1 capsule (20 mg total) by mouth daily.  Dispense: 30 capsule; Refill: 3  2. Hand cramps - methocarbamol (ROBAXIN) 500 MG tablet; Take 1 tablet (500 mg total) by mouth daily as needed for muscle spasms.  Dispense: 30 tablet; Refill: 1  3. Bilateral carpal tunnel syndrome No point in sending her back to orthopedics since she does not want repeat surgery or injections  4. Plantar fasciitis Has follow-up appointment with Dr. Logan Bores next week  5. Essential hypertension Well-controlled.  Continue current medication - CBC - Comprehensive metabolic panel - Lipid panel  Patient was given the opportunity to ask questions.  Patient verbalized understanding of the plan and was able to repeat key elements of the plan.   No orders of the defined types were placed in this encounter.    Requested Prescriptions    No prescriptions requested or ordered in this encounter    No follow-ups on file.  Jonah Blue, MD, FACP

## 2017-08-11 NOTE — Patient Instructions (Addendum)

## 2017-08-12 ENCOUNTER — Telehealth: Payer: Self-pay

## 2017-08-12 LAB — LIPID PANEL
CHOL/HDL RATIO: 3.5 ratio (ref 0.0–4.4)
Cholesterol, Total: 211 mg/dL — ABNORMAL HIGH (ref 100–199)
HDL: 60 mg/dL (ref >39)
LDL CALC: 137 mg/dL — AB (ref 0–99)
TRIGLYCERIDES: 71 mg/dL (ref 0–149)
VLDL Cholesterol Cal: 14 mg/dL (ref 5–40)

## 2017-08-12 LAB — CBC
HEMATOCRIT: 41.7 % (ref 34.0–46.6)
HEMOGLOBIN: 13.8 g/dL (ref 11.1–15.9)
MCH: 30.1 pg (ref 26.6–33.0)
MCHC: 33.1 g/dL (ref 31.5–35.7)
MCV: 91 fL (ref 79–97)
Platelets: 171 10*3/uL (ref 150–379)
RBC: 4.58 x10E6/uL (ref 3.77–5.28)
RDW: 14.8 % (ref 12.3–15.4)
WBC: 8.6 10*3/uL (ref 3.4–10.8)

## 2017-08-12 LAB — COMPREHENSIVE METABOLIC PANEL
ALBUMIN: 4.8 g/dL (ref 3.5–5.5)
ALT: 18 IU/L (ref 0–32)
AST: 29 IU/L (ref 0–40)
Albumin/Globulin Ratio: 1.7 (ref 1.2–2.2)
Alkaline Phosphatase: 71 IU/L (ref 39–117)
BUN / CREAT RATIO: 15 (ref 9–23)
BUN: 11 mg/dL (ref 6–24)
Bilirubin Total: 0.3 mg/dL (ref 0.0–1.2)
CALCIUM: 10 mg/dL (ref 8.7–10.2)
CO2: 22 mmol/L (ref 20–29)
CREATININE: 0.74 mg/dL (ref 0.57–1.00)
Chloride: 98 mmol/L (ref 96–106)
GFR calc Af Amer: 105 mL/min/{1.73_m2} (ref >59)
GFR, EST NON AFRICAN AMERICAN: 91 mL/min/{1.73_m2} (ref >59)
GLOBULIN, TOTAL: 2.9 g/dL (ref 1.5–4.5)
Glucose: 76 mg/dL (ref 65–99)
Potassium: 4.4 mmol/L (ref 3.5–5.2)
SODIUM: 138 mmol/L (ref 134–144)
Total Protein: 7.7 g/dL (ref 6.0–8.5)

## 2017-08-12 NOTE — Telephone Encounter (Signed)
Contacted pt to go over lab results pt is aware and doesn't have any questions or concerns 

## 2017-08-17 ENCOUNTER — Ambulatory Visit: Payer: Self-pay | Admitting: Podiatry

## 2017-08-17 MED FILL — AMLODIPINE BESYLATE 10 MG T: 10 | 30 days supply | Qty: 30 | Fill #5

## 2017-08-17 MED FILL — GABAPENTIN 800 MG TABLET: 800 | 30 days supply | Qty: 135 | Fill #3

## 2017-08-22 ENCOUNTER — Ambulatory Visit: Payer: No Typology Code available for payment source | Admitting: Podiatry

## 2017-08-22 DIAGNOSIS — M722 Plantar fascial fibromatosis: Secondary | ICD-10-CM

## 2017-08-23 NOTE — H&P (View-Only) (Signed)
Referring Provider: Marcine Matar, MD Primary Care Physician:  Marcine Matar, MD Primary GI:  Dr. Jena Gauss  Chief Complaint  Patient presents with  . Cirrhosis    right side pain x 3 months  . Gastroesophageal Reflux    f/u. Doing fine    HPI:   Rachel Hudson is a 57 y.o. female who presents for follow-up on cirrhosis and GERD.  The patient was last seen in our office 08/09/2016 for the same.  History of chronic hepatitis C.  Colonoscopy and endoscopy up-to-date and due for repeat EGD screening in February 2019 and colonoscopy in February 2027.  Generally well compensated disease with a meld score of 6 and child Pugh A.  At her last visit she was doing well overall.  Some lower abdominal pain described as "annoying" and would like to have it checked out.  History of GERD worsening and started taking Alka-Seltzer.  No other hepatic or GI symptoms.  Recommended labs, ultrasound, start Prilosec 20 mg daily.  Follow-up in 6 months for routine liver care.  Labs are completed 08/10/2016 which found normal AFP, normal INR, normal CBC, normal CMP.  Meld score 7, child Pugh A.  Complete abdominal ultrasound completed 08/10/2016 which found cirrhosis, complicated cystic lesion of the inferior left lobe, otherwise normal.  Liver cyst was unchanged.  It is been approximately 1 year since her last visit.  Today she states she's doing well. She has been very active recently with yard work. She has intermittent right-sided pain with known hepatomegaly. She has been eating well. Has had rare single drink for a special occasion. Denies yellowing of skin/eyes, darkened urine, acute episodic confusion, tremors. GERD well controlled on PPI, occasional breakthrough with specific dietary indiscretions. She was previously an alcoholic by her own admission. She describes occasional difficulty swallowing secretions or beverages. Admits "maybe" some solid food dysphagia. She is having some dry mouth as well.  Denies chest pain, dyspnea, dizziness, lightheadedness, syncope, near syncope. Denies any other upper or lower GI symptoms.  Past Medical History:  Diagnosis Date  . Cirrhosis of liver [facility]) September 2015   Stage 4  . GERD (gastroesophageal reflux disease)   . Hep C w/o coma, chronic (HCC) As of 10/22/13  . Hypertension     Past Surgical History:  Procedure Laterality Date  . BACK SURGERY    . CARPAL TUNNEL RELEASE Right 11/13/2015   Procedure: RIGHT CARPAL TUNNEL RELEASE;  Surgeon: Betha Loa, MD;  Location: [facility];  Service: Orthopedics;  Laterality: Right;  . CARPAL TUNNEL RELEASE Left 01/29/2016   Procedure: left CARPAL TUNNEL RELEASE;  Surgeon: Betha Loa, MD;  Location: [facility];  Service: Orthopedics;  Laterality: Left;  . COLONOSCOPY N/A 07/08/2015   Procedure: COLONOSCOPY;  Surgeon: Corbin Ade, MD;  [facility];  Service: Endoscopy;  Laterality: N/A;  230  . ESOPHAGOGASTRODUODENOSCOPY N/A 07/08/2015   Procedure: ESOPHAGOGASTRODUODENOSCOPY (EGD);  Surgeon: Corbin Ade, MD;  [facility];  Service: Endoscopy;  Laterality: N/A;  . LIGAMENT REPAIR Right 1980   Knee    Current Outpatient Medications  Medication Sig Dispense Refill  . amLODipine (NORVASC) 10 MG tablet Take 1 tablet (10 mg total) by mouth daily. 90 tablet 3  . DULoxetine (CYMBALTA) 20 MG capsule Take 1 capsule (20 mg total) by mouth daily. 30 capsule 3  . fluticasone (FLONASE) 50 MCG/ACT nasal spray Place 2 sprays into both nostrils daily. 16 g 6  .  gabapentin (NEURONTIN) 800 MG tablet TAKE 1 & 1/2 TABLETS BY MOUTH 3 TIMES DAILY 135 tablet 5  . Lecith-Inosi-Chol-B12-Liver (LIVERITE) 1.125 MCG TABS Take by mouth 2 (two) times daily.    . methocarbamol (ROBAXIN) 500 MG tablet Take 1 tablet (500 mg total) by mouth daily as needed for muscle spasms. 30 tablet 1  . milk thistle 175 MG tablet Take 175 mg by mouth daily.    . Multiple Vitamin (MULTIVITAMIN  WITH MINERALS) TABS tablet Take 1 tablet by mouth daily.    . Omega-3 Fatty Acids (FISH OIL) 1200 MG CAPS Take by mouth.     No current facility-administered medications for this visit.     Allergies as of 08/24/2017 - Review Complete 08/24/2017  Allergen Reaction Noted  . Penicillins  03/28/2011  . Tylenol [acetaminophen] Other (See Comments) 07/14/2014    Family History  Adopted: Yes  Problem Relation Age of Onset  . Colon cancer Neg Hx     Social History   Socioeconomic History  . Marital status: Single    Spouse name: Not on file  . Number of children: Not on file  . Years of education: Not on file  . Highest education level: Not on file  Occupational History  . Not on file  Social Needs  . Financial resource strain: Not on file  . Food insecurity:    Worry: Not on file    Inability: Not on file  . Transportation needs:    Medical: Not on file    Non-medical: Not on file  Tobacco Use  . Smoking status: Never Smoker  . Smokeless tobacco: Never Used  Substance and Sexual Activity  . Alcohol use: No    Alcohol/week: 0.0 oz    Comment: occasionally  . Drug use: Yes    Frequency: 7.0 times per week    Types: Marijuana  . Sexual activity: Yes    Birth control/protection: None  Lifestyle  . Physical activity:    Days per week: Not on file    Minutes per session: Not on file  . Stress: Not on file  Relationships  . Social connections:    Talks on phone: Not on file    Gets together: Not on file    Attends religious service: Not on file    Active member of club or organization: Not on file    Attends meetings of clubs or organizations: Not on file    Relationship status: Not on file  Other Topics Concern  . Not on file  Social History Narrative  . Not on file    Review of Systems: General: Negative for anorexia, weight loss, fever, chills, fatigue, weakness. ENT: Negative for hoarseness. CV: Negative for chest pain, angina, palpitations, peripheral  edema.  Respiratory: Negative for dyspnea at rest, cough, sputum, wheezing.  GI: See history of present illness. MS: Chronic back pain.  Derm: Negative for rash or itching.  Neuro: Negative for memory loss, confusion.  Endo: Negative for unusual weight change.  Heme: Negative for bruising or bleeding. Allergy: Negative for rash or hives.   Physical Exam: BP 124/77   Pulse 70   Temp (!) 97.4 F (36.3 C) (Oral)   Ht 5\' 1"  (1.549 m)   Wt 171 lb 9.6 oz (77.8 kg)   LMP 03/01/2016   BMI 32.42 kg/m  General:   Alert and oriented. Pleasant and cooperative. Well-nourished and well-developed.  Eyes:  Without icterus, sclera clear and conjunctiva pink.  Ears:  Normal auditory  acuity. Cardiovascular:  S1, S2 present without murmurs appreciated. Extremities without clubbing or edema. Respiratory:  Clear to auscultation bilaterally. No wheezes, rales, or rhonchi. No distress.  Gastrointestinal:  +BS, soft, non-tender and non-distended. Mild hepatomegaly noted with liver edge noted 2 fingerbreadths below the right costal margin. No splenomegaly noted. No guarding or rebound. No masses appreciated. No tremors flapping noted. Rectal:  Deferred  Musculoskalatal:  Symmetrical without gross deformities. Neurologic:  Alert and oriented x4;  grossly normal neurologically. Psych:  Alert and cooperative. Normal mood and affect. Heme/Lymph/Immune: No excessive bruising noted.    08/24/2017 8:28 AM   Disclaimer: This note was dictated with voice recognition software. Similar sounding words can inadvertently be transcribed and may not be corrected upon review.\

## 2017-08-23 NOTE — Progress Notes (Signed)
Referring Provider: Marcine Matar, MD Primary Care Physician:  Marcine Matar, MD Primary GI:  Dr. Jena Gauss  Chief Complaint  Patient presents with  . Cirrhosis    right side pain x 3 months  . Gastroesophageal Reflux    f/u. Doing fine    HPI:   Rachel Hudson is a 57 y.o. female who presents for follow-up on cirrhosis and GERD.  The patient was last seen in our office 08/09/2016 for the same.  History of chronic hepatitis C.  Colonoscopy and endoscopy up-to-date and due for repeat EGD screening in February 2019 and colonoscopy in February 2027.  Generally well compensated disease with a meld score of 6 and child Pugh A.  At her last visit she was doing well overall.  Some lower abdominal pain described as "annoying" and would like to have it checked out.  History of GERD worsening and started taking Alka-Seltzer.  No other hepatic or GI symptoms.  Recommended labs, ultrasound, start Prilosec 20 mg daily.  Follow-up in 6 months for routine liver care.  Labs are completed 08/10/2016 which found normal AFP, normal INR, normal CBC, normal CMP.  Meld score 7, child Pugh A.  Complete abdominal ultrasound completed 08/10/2016 which found cirrhosis, complicated cystic lesion of the inferior left lobe, otherwise normal.  Liver cyst was unchanged.  It is been approximately 1 year since her last visit.  Today she states she's doing well. She has been very active recently with yard work. She has intermittent right-sided pain with known hepatomegaly. She has been eating well. Has had rare single drink for a special occasion. Denies yellowing of skin/eyes, darkened urine, acute episodic confusion, tremors. GERD well controlled on PPI, occasional breakthrough with specific dietary indiscretions. She was previously an alcoholic by her own admission. She describes occasional difficulty swallowing secretions or beverages. Admits "maybe" some solid food dysphagia. She is having some dry mouth as well.  Denies chest pain, dyspnea, dizziness, lightheadedness, syncope, near syncope. Denies any other upper or lower GI symptoms.  Past Medical History:  Diagnosis Date  . Cirrhosis of liver [facility]) September 2015   Stage 4  . GERD (gastroesophageal reflux disease)   . Hep C w/o coma, chronic (HCC) As of 10/22/13  . Hypertension     Past Surgical History:  Procedure Laterality Date  . BACK SURGERY    . CARPAL TUNNEL RELEASE Right 11/13/2015   Procedure: RIGHT CARPAL TUNNEL RELEASE;  Surgeon: Betha Loa, MD;  Location: [facility];  Service: Orthopedics;  Laterality: Right;  . CARPAL TUNNEL RELEASE Left 01/29/2016   Procedure: left CARPAL TUNNEL RELEASE;  Surgeon: Betha Loa, MD;  Location: [facility];  Service: Orthopedics;  Laterality: Left;  . COLONOSCOPY N/A 07/08/2015   Procedure: COLONOSCOPY;  Surgeon: Corbin Ade, MD;  [facility];  Service: Endoscopy;  Laterality: N/A;  230  . ESOPHAGOGASTRODUODENOSCOPY N/A 07/08/2015   Procedure: ESOPHAGOGASTRODUODENOSCOPY (EGD);  Surgeon: Corbin Ade, MD;  [facility];  Service: Endoscopy;  Laterality: N/A;  . LIGAMENT REPAIR Right 1980   Knee    Current Outpatient Medications  Medication Sig Dispense Refill  . amLODipine (NORVASC) 10 MG tablet Take 1 tablet (10 mg total) by mouth daily. 90 tablet 3  . DULoxetine (CYMBALTA) 20 MG capsule Take 1 capsule (20 mg total) by mouth daily. 30 capsule 3  . fluticasone (FLONASE) 50 MCG/ACT nasal spray Place 2 sprays into both nostrils daily. 16 g 6  .  gabapentin (NEURONTIN) 800 MG tablet TAKE 1 & 1/2 TABLETS BY MOUTH 3 TIMES DAILY 135 tablet 5  . Lecith-Inosi-Chol-B12-Liver (LIVERITE) 1.125 MCG TABS Take by mouth 2 (two) times daily.    . methocarbamol (ROBAXIN) 500 MG tablet Take 1 tablet (500 mg total) by mouth daily as needed for muscle spasms. 30 tablet 1  . milk thistle 175 MG tablet Take 175 mg by mouth daily.    . Multiple Vitamin (MULTIVITAMIN  WITH MINERALS) TABS tablet Take 1 tablet by mouth daily.    . Omega-3 Fatty Acids (FISH OIL) 1200 MG CAPS Take by mouth.     No current facility-administered medications for this visit.     Allergies as of 08/24/2017 - Review Complete 08/24/2017  Allergen Reaction Noted  . Penicillins  03/28/2011  . Tylenol [acetaminophen] Other (See Comments) 07/14/2014    Family History  Adopted: Yes  Problem Relation Age of Onset  . Colon cancer Neg Hx     Social History   Socioeconomic History  . Marital status: Single    Spouse name: Not on file  . Number of children: Not on file  . Years of education: Not on file  . Highest education level: Not on file  Occupational History  . Not on file  Social Needs  . Financial resource strain: Not on file  . Food insecurity:    Worry: Not on file    Inability: Not on file  . Transportation needs:    Medical: Not on file    Non-medical: Not on file  Tobacco Use  . Smoking status: Never Smoker  . Smokeless tobacco: Never Used  Substance and Sexual Activity  . Alcohol use: No    Alcohol/week: 0.0 oz    Comment: occasionally  . Drug use: Yes    Frequency: 7.0 times per week    Types: Marijuana  . Sexual activity: Yes    Birth control/protection: None  Lifestyle  . Physical activity:    Days per week: Not on file    Minutes per session: Not on file  . Stress: Not on file  Relationships  . Social connections:    Talks on phone: Not on file    Gets together: Not on file    Attends religious service: Not on file    Active member of club or organization: Not on file    Attends meetings of clubs or organizations: Not on file    Relationship status: Not on file  Other Topics Concern  . Not on file  Social History Narrative  . Not on file    Review of Systems: General: Negative for anorexia, weight loss, fever, chills, fatigue, weakness. ENT: Negative for hoarseness. CV: Negative for chest pain, angina, palpitations, peripheral  edema.  Respiratory: Negative for dyspnea at rest, cough, sputum, wheezing.  GI: See history of present illness. MS: Chronic back pain.  Derm: Negative for rash or itching.  Neuro: Negative for memory loss, confusion.  Endo: Negative for unusual weight change.  Heme: Negative for bruising or bleeding. Allergy: Negative for rash or hives.   Physical Exam: BP 124/77   Pulse 70   Temp (!) 97.4 F (36.3 C) (Oral)   Ht 5\' 1"  (1.549 m)   Wt 171 lb 9.6 oz (77.8 kg)   LMP 03/01/2016   BMI 32.42 kg/m  General:   Alert and oriented. Pleasant and cooperative. Well-nourished and well-developed.  Eyes:  Without icterus, sclera clear and conjunctiva pink.  Ears:  Normal auditory  acuity. Cardiovascular:  S1, S2 present without murmurs appreciated. Extremities without clubbing or edema. Respiratory:  Clear to auscultation bilaterally. No wheezes, rales, or rhonchi. No distress.  Gastrointestinal:  +BS, soft, non-tender and non-distended. Mild hepatomegaly noted with liver edge noted 2 fingerbreadths below the right costal margin. No splenomegaly noted. No guarding or rebound. No masses appreciated. No tremors flapping noted. Rectal:  Deferred  Musculoskalatal:  Symmetrical without gross deformities. Neurologic:  Alert and oriented x4;  grossly normal neurologically. Psych:  Alert and cooperative. Normal mood and affect. Heme/Lymph/Immune: No excessive bruising noted.    08/24/2017 8:28 AM   Disclaimer: This note was dictated with voice recognition software. Similar sounding words can inadvertently be transcribed and may not be corrected upon review.\

## 2017-08-24 ENCOUNTER — Encounter: Payer: Self-pay | Admitting: Nurse Practitioner

## 2017-08-24 ENCOUNTER — Other Ambulatory Visit: Payer: Self-pay

## 2017-08-24 ENCOUNTER — Telehealth: Payer: Self-pay

## 2017-08-24 ENCOUNTER — Ambulatory Visit (INDEPENDENT_AMBULATORY_CARE_PROVIDER_SITE_OTHER): Payer: Self-pay | Admitting: Nurse Practitioner

## 2017-08-24 VITALS — BP 124/77 | HR 70 | Temp 97.4°F | Ht 61.0 in | Wt 171.6 lb

## 2017-08-24 DIAGNOSIS — R1310 Dysphagia, unspecified: Secondary | ICD-10-CM

## 2017-08-24 DIAGNOSIS — K7460 Unspecified cirrhosis of liver: Secondary | ICD-10-CM

## 2017-08-24 DIAGNOSIS — K219 Gastro-esophageal reflux disease without esophagitis: Secondary | ICD-10-CM

## 2017-08-24 HISTORY — DX: Dysphagia, unspecified: R13.10

## 2017-08-24 NOTE — Assessment & Plan Note (Signed)
The patient initially describes vague complaints of difficulty swallowing saliva.  Occasionally with difficulty with liquids.  She states "I guess" if she has any solid food dysphagia but she is not able to describe this very much.  Given that she is due for an upper endoscopy at this time related to variceal screening we will add on the possibility of a dilation if she does have some sort of stricture, web, ring.  Otherwise, we can plan for barium pill esophagram if this EGD looks normal.  Follow-up in 6 months.

## 2017-08-24 NOTE — Patient Instructions (Signed)
1. Have your blood test completed when you are able to. 2. We will help schedule your ultrasound for you. 3. We will schedule your upper endoscopy with possible dilation for you. 4. Otherwise, continue your current medications. 5. Follow-up in 6 months. 6. Call us if you have any questions or concerns   It was great to see you today!  Have a fantastic spring and summer!!    At [facility] we value your feedback. You may receive a survey about your visit today. Please share your experience as we strive to create trusing relationships with our patients to provide genuine, compassionate, quality care.

## 2017-08-24 NOTE — Assessment & Plan Note (Signed)
GERD generally well controlled.  Rare breakthrough symptoms with specific dietary indiscretions.  We discussed the need to avoid dietary triggers.  Continue PPI otherwise.  Follow-up in 6 months.

## 2017-08-24 NOTE — Assessment & Plan Note (Addendum)
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  She is essentially abstaining from alcohol.  She will have a rare single drink for special occasions but states she does not even usually finish that one drink.  She is more active, doing lots of yard work, feeling good.  No hepatic s/s today. Occasional right sided soreness with known hepatomegaly again evident on exam today. At this point we will check routine labs and imaging including CBC, CMP, INR, AFP, right upper quadrant ultrasound for hepatoma screening.  She is due currently for repeat EGD.  We will plan this.  We will also allow for possible dilation given complaints of dysphagia as per below.  Follow-up in 6 months.  Proceed with EGD +/- dilation on propofol/MAC with Dr. Gala Romney in near future: the risks, benefits, and alternatives have been discussed with the patient in detail. The patient states understanding and desires to proceed.  The patient is on Cymbalta and Neurontin.  Patient is not on any other anticoagulants, anxiolytics, chronic pain medications, or antidepressants.  However, she does have a history of alcoholism.  Given this we will plan for the procedure on propofol/MAC to promote adequate sedation.

## 2017-08-24 NOTE — Patient Instructions (Signed)
Handwrote on US abd ruq appt letter nothing to eat or drink after midnight before test.

## 2017-08-24 NOTE — Telephone Encounter (Signed)
Tried to call pt to inform of pre-op appt 09/06/17 at 1:45pm, call went straight to VM. Unable to leave VM d/t mailbox is full. Letter mailed.

## 2017-08-24 NOTE — Progress Notes (Signed)
   Subjective: Patient presents today for follow up evaluation of plantar fasciitis of the bilateral feet. She states the pain has worsened. She is requesting more injections. Patient presents today for further treatment and evaluation.   Past Medical History:  Diagnosis Date  . 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 GERD (gastroesophageal reflux disease)   . Hep C w/o coma, chronic (HCC) As of 10/22/13  . Hypertension      Objective: Physical Exam General: The patient is alert and oriented x3 in no acute distress.  Dermatology: Skin is warm, dry and supple bilateral lower extremities. Negative for open lesions or macerations bilateral.   Vascular: Dorsalis Pedis and Posterior Tibial pulses palpable bilateral.  Capillary fill time is immediate to all digits.  Neurological: Epicritic and protective threshold intact bilateral.   Musculoskeletal: Tenderness to palpation at the medial calcaneal tubercale and through the insertion of the plantar fascia of the bilateral feet. All other joints range of motion within normal limits bilateral. Strength 5/5 in all groups bilateral.    Assessment: 1. plantar fasciitis bilateral feet  Plan of Care:  1. Patient evaluated. 2. Injection of 0.5cc Celestone soluspan injected into the bilateral heels.  3. Cannot tolerate oral Mobic due to upset stomach.  4. Recommended good shoes.  5. Continue wearing fascial braces.  6. Return to clinic in 6 weeks.   Felecia ShellingBrent M. Evans, DPM [facility]  Dr. Felecia ShellingBrent M. Evans, DPM    [address]                                   [place], [place] [address]                Office [phone]  Fax [phone]

## 2017-08-24 NOTE — Progress Notes (Signed)
CC'D TO PCP °

## 2017-08-29 ENCOUNTER — Ambulatory Visit (HOSPITAL_COMMUNITY)
Admission: RE | Admit: 2017-08-29 | Discharge: 2017-08-29 | Disposition: A | Discharge status: Home / Self Care | Payer: Self-pay | Source: Ambulatory Visit | Attending: Nurse Practitioner | Admitting: Nurse Practitioner

## 2017-08-29 DIAGNOSIS — K7460 Unspecified cirrhosis of liver: Secondary | ICD-10-CM | POA: Insufficient documentation

## 2017-08-29 DIAGNOSIS — K7689 Other specified diseases of liver: Secondary | ICD-10-CM | POA: Insufficient documentation

## 2017-08-29 DIAGNOSIS — K219 Gastro-esophageal reflux disease without esophagitis: Secondary | ICD-10-CM | POA: Insufficient documentation

## 2017-09-05 NOTE — Patient Instructions (Signed)
Rachel CottaFrances M Hudson  09/05/2017     @PREFPERIOPPHARMACY @   Your procedure is scheduled on  09/12/2017   [facility] at  730   A.M.  Call this number if you have problems the morning of surgery:  [phone]   Remember:  Do not eat food or drink liquids after midnight.  Take these medicines the morning of surgery with A SIP OF WATER None   Do not wear jewelry, make-up or nail polish.  Do not wear lotions, powders, or perfumes, or deodorant.  Do not shave 48 hours prior to surgery.  Men may shave face and neck.  Do not bring valuables to the hospital.  [facility] is not responsible for any belongings or valuables.  Contacts, dentures or bridgework may not be worn into surgery.  Leave your suitcase in the car.  After surgery it may be brought to your room.  For patients admitted to the hospital, discharge time will be determined by your treatment team.  Patients discharged the day of surgery will not be allowed to drive home.   Name and phone number of your driver:   family Special instructions:  Follow the diet instructions given to you by Dr Luvenia Starchourk's office  Please read over the following fact sheets that you were given. Anesthesia Post-op Instructions and Care and Recovery After Surgery       Esophagogastroduodenoscopy Esophagogastroduodenoscopy (EGD) is a procedure to examine the lining of the esophagus, stomach, and first part of the small intestine (duodenum). This procedure is done to check for problems such as inflammation, bleeding, ulcers, or growths. During this procedure, a long, flexible, lighted tube with a camera attached (endoscope) is inserted down the throat. Tell a health care provider about:  Any allergies you have.  All medicines you are taking, including vitamins, herbs, eye drops, creams, and over-the-counter medicines.  Any problems you or family members have had with anesthetic medicines.  Any blood disorders you  have.  Any surgeries you have had.  Any medical conditions you have.  Whether you are pregnant or may be pregnant. What are the risks? Generally, this is a safe procedure. However, problems may occur, including:  Infection.  Bleeding.  A tear (perforation) in the esophagus, stomach, or duodenum.  Trouble breathing.  Excessive sweating.  Spasms of the larynx.  A slowed heartbeat.  Low blood pressure.  What happens before the procedure?  Follow instructions from your health care provider about eating or drinking restrictions.  Ask your health care provider about: ? Changing or stopping your regular medicines. This is especially important if you are taking diabetes medicines or blood thinners. ? Taking medicines such as aspirin and ibuprofen. These medicines can thin your blood. Do not take these medicines before your procedure if your health care provider instructs you not to.  Plan to have someone take you home after the procedure.  If you wear dentures, be ready to remove them before the procedure. What happens during the procedure?  To reduce your risk of infection, your health care team will wash or sanitize their hands.  An IV tube will be put in a vein in your hand or arm. You will get medicines and fluids through this tube.  You will be given one or more of the following: ? A medicine to help you relax (sedative). ? A medicine to numb the area (local anesthetic). This medicine may be sprayed into  your throat. It will make you feel more comfortable and keep you from gagging or coughing during the procedure. ? A medicine for pain.  A mouth guard may be placed in your mouth to protect your teeth and to keep you from biting on the endoscope.  You will be asked to lie on your left side.  The endoscope will be lowered down your throat into your esophagus, stomach, and duodenum.  Air will be put into the endoscope. This will help your health care provider see  better.  The lining of your esophagus, stomach, and duodenum will be examined.  Your health care provider may: ? Take a tissue sample so it can be looked at in a lab (biopsy). ? Remove growths. ? Remove objects (foreign bodies) that are stuck. ? Treat any bleeding with medicines or other devices that stop tissue from bleeding. ? Widen (dilate) or stretch narrowed areas of your esophagus and stomach.  The endoscope will be taken out. The procedure may vary among health care providers and hospitals. What happens after the procedure?  Your blood pressure, heart rate, breathing rate, and blood oxygen level will be monitored often until the medicines you were given have worn off.  Do not eat or drink anything until the numbing medicine has worn off and your gag reflex has returned. This information is not intended to replace advice given to you by your health care provider. Make sure you discuss any questions you have with your health care provider. Document Released: 09/10/2004 Document Revised: 10/16/2015 Document Reviewed: 04/03/2015 Elsevier Interactive Patient Education  2018 [employer]. Esophagogastroduodenoscopy, Care After Refer to this sheet in the next few weeks. These instructions provide you with information about caring for yourself after your procedure. Your health care provider may also give you more specific instructions. Your treatment has been planned according to current medical practices, but problems sometimes occur. Call your health care provider if you have any problems or questions after your procedure. What can I expect after the procedure? After the procedure, it is common to have:  A sore throat.  Nausea.  Bloating.  Dizziness.  Fatigue.  Follow these instructions at home:  Do not eat or drink anything until the numbing medicine (local anesthetic) has worn off and your gag reflex has returned. You will know that the local anesthetic has worn off when you  can swallow comfortably.  Do not drive for 24 hours if you received a medicine to help you relax (sedative).  If your health care provider took a tissue sample for testing during the procedure, make sure to get your test results. This is your responsibility. Ask your health care provider or the department performing the test when your results will be ready.  Keep all follow-up visits as told by your health care provider. This is important. Contact a health care provider if:  You cannot stop coughing.  You are not urinating.  You are urinating less than usual. Get help right away if:  You have trouble swallowing.  You cannot eat or drink.  You have throat or chest pain that gets worse.  You are dizzy or light-headed.  You faint.  You have nausea or vomiting.  You have chills.  You have a fever.  You have severe abdominal pain.  You have black, tarry, or bloody stools. This information is not intended to replace advice given to you by your health care provider. Make sure you discuss any questions you have with your health care  provider. Document Released: 04/26/2012 Document Revised: 10/16/2015 Document Reviewed: 04/03/2015 Elsevier Interactive Patient Education  2018 [employer].  Esophageal Dilatation Esophageal dilatation is a procedure to open a blocked or narrowed part of the esophagus. The esophagus is the long tube in your throat that carries food and liquid from your mouth to your stomach. The procedure is also called esophageal dilation. You may need this procedure if you have a buildup of scar tissue in your esophagus that makes it difficult, painful, or even impossible to swallow. This can be caused by gastroesophageal reflux disease (GERD). In rare cases, people need this procedure because they have cancer of the esophagus or a problem with the way food moves through the esophagus. Sometimes you may need to have another dilatation to enlarge the opening of the  esophagus gradually. Tell a health care provider about:  Any allergies you have.  All medicines you are taking, including vitamins, herbs, eye drops, creams, and over-the-counter medicines.  Any problems you or family members have had with anesthetic medicines.  Any blood disorders you have.  Any surgeries you have had.  Any medical conditions you have.  Any antibiotic medicines you are required to take before dental procedures. What are the risks? Generally, this is a safe procedure. However, problems can occur and include:  Bleeding from a tear in the lining of the esophagus.  A hole (perforation) in the esophagus.  What happens before the procedure?  Do not eat or drink anything after midnight on the night before the procedure or as directed by your health care provider.  Ask your health care provider about changing or stopping your regular medicines. This is especially important if you are taking diabetes medicines or blood thinners.  Plan to have someone take you home after the procedure. What happens during the procedure?  You will be given a medicine that makes you relaxed and sleepy (sedative).  A medicine may be sprayed or gargled to numb the back of the throat.  Your health care provider can use various instruments to do an esophageal dilatation. During the procedure, the instrument used will be placed in your mouth and passed down into your esophagus. Options include: ? Simple dilators. This instrument is carefully placed in the esophagus to stretch it. ? Guided wire bougies. In this method, a flexible tube (endoscope) is used to insert a wire into the esophagus. The dilator is passed over this wire to enlarge the esophagus. Then the wire is removed. ? Balloon dilators. An endoscope with a small balloon at the end is passed down into the esophagus. Inflating the balloon gently stretches the esophagus and opens it up. What happens after the procedure?  Your blood  pressure, heart rate, breathing rate, and blood oxygen level will be monitored often until the medicines you were given have worn off.  Your throat may feel slightly sore and will probably still feel numb. This will improve slowly over time.  You will not be allowed to eat or drink until the throat numbness has resolved.  If this is a same-day procedure, you may be allowed to go home once you have been able to drink, urinate, and sit on the edge of the bed without nausea or dizziness.  If this is a same-day procedure, you should have a friend or family member with you for the next 24 hours after the procedure. This information is not intended to replace advice given to you by your health care provider. Make sure you discuss  any questions you have with your health care provider. Document Released: 07/01/2005 Document Revised: 10/16/2015 Document Reviewed: 09/19/2013 Elsevier Interactive Patient Education  2018 [place] Anesthesia is a term that refers to techniques, procedures, and medicines that help a person stay safe and comfortable during a medical procedure. Monitored anesthesia care, or sedation, is one type of anesthesia. Your anesthesia specialist may recommend sedation if you will be having a procedure that does not require you to be unconscious, such as:  Cataract surgery.  A dental procedure.  A biopsy.  A colonoscopy.  During the procedure, you may receive a medicine to help you relax (sedative). There are three levels of sedation:  Mild sedation. At this level, you may feel awake and relaxed. You will be able to follow directions.  Moderate sedation. At this level, you will be sleepy. You may not remember the procedure.  Deep sedation. At this level, you will be asleep. You will not remember the procedure.  The more medicine you are given, the deeper your level of sedation will be. Depending on how you respond to the procedure, the  anesthesia specialist may change your level of sedation or the type of anesthesia to fit your needs. An anesthesia specialist will monitor you closely during the procedure. Let your health care provider know about:  Any allergies you have.  All medicines you are taking, including vitamins, herbs, eye drops, creams, and over-the-counter medicines.  Any use of steroids (by mouth or as a cream).  Any problems you or family members have had with sedatives and anesthetic medicines.  Any blood disorders you have.  Any surgeries you have had.  Any medical conditions you have, such as sleep apnea.  Whether you are pregnant or may be pregnant.  Any use of cigarettes, alcohol, or street drugs. What are the risks? Generally, this is a safe procedure. However, problems may occur, including:  Getting too much medicine (oversedation).  Nausea.  Allergic reaction to medicines.  Trouble breathing. If this happens, a breathing tube may be used to help with breathing. It will be removed when you are awake and breathing on your own.  Heart trouble.  Lung trouble.  Before the procedure Staying hydrated Follow instructions from your health care provider about hydration, which may include:  Up to 2 hours before the procedure - you may continue to drink clear liquids, such as water, clear fruit juice, black coffee, and plain tea.  Eating and drinking restrictions Follow instructions from your health care provider about eating and drinking, which may include:  8 hours before the procedure - stop eating heavy meals or foods such as meat, fried foods, or fatty foods.  6 hours before the procedure - stop eating light meals or foods, such as toast or cereal.  6 hours before the procedure - stop drinking milk or drinks that contain milk.  2 hours before the procedure - stop drinking clear liquids.  Medicines Ask your health care provider about:  Changing or stopping your regular medicines.  This is especially important if you are taking diabetes medicines or blood thinners.  Taking medicines such as aspirin and ibuprofen. These medicines can thin your blood. Do not take these medicines before your procedure if your health care provider instructs you not to.  Tests and exams  You will have a physical exam.  You may have blood tests done to show: ? How well your kidneys and liver are working. ? How well your blood  can clot.  General instructions  Plan to have someone take you home from the hospital or clinic.  If you will be going home right after the procedure, plan to have someone with you for 24 hours.  What happens during the procedure?  Your blood pressure, heart rate, breathing, level of pain and overall condition will be monitored.  An IV tube will be inserted into one of your veins.  Your anesthesia specialist will give you medicines as needed to keep you comfortable during the procedure. This may mean changing the level of sedation.  The procedure will be performed. After the procedure  Your blood pressure, heart rate, breathing rate, and blood oxygen level will be monitored until the medicines you were given have worn off.  Do not drive for 24 hours if you received a sedative.  You may: ? Feel sleepy, clumsy, or nauseous. ? Feel forgetful about what happened after the procedure. ? Have a sore throat if you had a breathing tube during the procedure. ? Vomit. This information is not intended to replace advice given to you by your health care provider. Make sure you discuss any questions you have with your health care provider. Document Released: 02/03/2005 Document Revised: 10/17/2015 Document Reviewed: 08/31/2015 Elsevier Interactive Patient Education  2018 [place], Care After These instructions provide you with information about caring for yourself after your procedure. Your health care provider may also give you more  specific instructions. Your treatment has been planned according to current medical practices, but problems sometimes occur. Call your health care provider if you have any problems or questions after your procedure. What can I expect after the procedure? After your procedure, it is common to:  Feel sleepy for several hours.  Feel clumsy and have poor balance for several hours.  Feel forgetful about what happened after the procedure.  Have poor judgment for several hours.  Feel nauseous or vomit.  Have a sore throat if you had a breathing tube during the procedure.  Follow these instructions at home: For at least 24 hours after the procedure:   Do not: ? Participate in activities in which you could fall or become injured. ? Drive. ? Use heavy machinery. ? Drink alcohol. ? Take sleeping pills or medicines that cause drowsiness. ? Make important decisions or sign legal documents. ? Take care of children on your own.  Rest. Eating and drinking  Follow the diet that is recommended by your health care provider.  If you vomit, drink water, juice, or soup when you can drink without vomiting.  Make sure you have little or no nausea before eating solid foods. General instructions  Have a responsible adult stay with you until you are awake and alert.  Take over-the-counter and prescription medicines only as told by your health care provider.  If you smoke, do not smoke without supervision.  Keep all follow-up visits as told by your health care provider. This is important. Contact a health care provider if:  You keep feeling nauseous or you keep vomiting.  You feel light-headed.  You develop a rash.  You have a fever. Get help right away if:  You have trouble breathing. This information is not intended to replace advice given to you by your health care provider. Make sure you discuss any questions you have with your health care provider. Document Released: 08/31/2015  Document Revised: 12/31/2015 Document Reviewed: 08/31/2015 Elsevier Interactive Patient Education  [employer].

## 2017-09-06 ENCOUNTER — Encounter (HOSPITAL_COMMUNITY)
Admission: RE | Admit: 2017-09-06 | Discharge: 2017-09-06 | Disposition: A | Discharge status: Home / Self Care | Payer: Self-pay | Source: Ambulatory Visit | Attending: Internal Medicine | Admitting: Internal Medicine

## 2017-09-06 ENCOUNTER — Encounter (HOSPITAL_COMMUNITY): Payer: Self-pay

## 2017-09-06 ENCOUNTER — Other Ambulatory Visit: Payer: Self-pay

## 2017-09-06 DIAGNOSIS — Z01810 Encounter for preprocedural cardiovascular examination: Secondary | ICD-10-CM | POA: Insufficient documentation

## 2017-09-06 DIAGNOSIS — Z01812 Encounter for preprocedural laboratory examination: Secondary | ICD-10-CM | POA: Insufficient documentation

## 2017-09-06 HISTORY — DX: Unspecified osteoarthritis, unspecified site: M19.90

## 2017-09-06 HISTORY — DX: Anxiety disorder, unspecified: F41.9

## 2017-09-06 HISTORY — DX: Gout, unspecified: M10.9

## 2017-09-06 LAB — COMPREHENSIVE METABOLIC PANEL
ALBUMIN: 4.1 g/dL (ref 3.5–5.0)
ALK PHOS: 61 U/L (ref 38–126)
ALT: 13 U/L — AB (ref 14–54)
AST: 19 U/L (ref 15–41)
Anion gap: 10 (ref 5–15)
BUN: 11 mg/dL (ref 6–20)
CALCIUM: 9.5 mg/dL (ref 8.9–10.3)
CO2: 25 mmol/L (ref 22–32)
CREATININE: 0.84 mg/dL (ref 0.44–1.00)
Chloride: 103 mmol/L (ref 101–111)
GFR calc Af Amer: >60 mL/min (ref >60)
GFR calc non Af Amer: >60 mL/min (ref >60)
GLUCOSE: 102 mg/dL — AB (ref 65–99)
Potassium: 3.4 mmol/L — ABNORMAL LOW (ref 3.5–5.1)
SODIUM: 138 mmol/L (ref 135–145)
Total Bilirubin: 0.4 mg/dL (ref 0.3–1.2)
Total Protein: 7.8 g/dL (ref 6.5–8.1)

## 2017-09-06 LAB — CBC WITH DIFFERENTIAL/PLATELET
BASOS PCT: 0 %
Basophils Absolute: 0 10*3/uL (ref 0.0–0.1)
EOS ABS: 0.3 10*3/uL (ref 0.0–0.7)
Eosinophils Relative: 3 %
HEMATOCRIT: 39.2 % (ref 36.0–46.0)
HEMOGLOBIN: 12.8 g/dL (ref 12.0–15.0)
LYMPHS ABS: 3.7 10*3/uL (ref 0.7–4.0)
Lymphocytes Relative: 31 %
MCH: 29.2 pg (ref 26.0–34.0)
MCHC: 32.7 g/dL (ref 30.0–36.0)
MCV: 89.5 fL (ref 78.0–100.0)
Monocytes Absolute: 0.6 10*3/uL (ref 0.1–1.0)
Monocytes Relative: 5 %
NEUTROS ABS: 7.1 10*3/uL (ref 1.7–7.7)
NEUTROS PCT: 61 %
Platelets: 176 10*3/uL (ref 150–400)
RBC: 4.38 MIL/uL (ref 3.87–5.11)
RDW: 13.8 % (ref 11.5–15.5)
WBC: 11.7 10*3/uL — AB (ref 4.0–10.5)

## 2017-09-06 LAB — PROTIME-INR
INR: 1.01
Prothrombin Time: 13.3 seconds (ref 11.4–15.2)

## 2017-09-07 LAB — AFP TUMOR MARKER: AFP, SERUM, TUMOR MARKER: 2.6 ng/mL (ref 0.0–8.3)

## 2017-09-08 ENCOUNTER — Telehealth: Payer: Self-pay | Admitting: Internal Medicine

## 2017-09-08 NOTE — Telephone Encounter (Signed)
Pt calling for results on her U/S. Please call [phone]

## 2017-09-08 NOTE — Telephone Encounter (Signed)
See result note.  

## 2017-09-08 NOTE — Telephone Encounter (Signed)
Pt is inquiring her results s/p procedure.

## 2017-09-12 ENCOUNTER — Encounter (HOSPITAL_COMMUNITY): Payer: Self-pay

## 2017-09-12 ENCOUNTER — Encounter (HOSPITAL_COMMUNITY)
Admission: RE | Disposition: A | Discharge status: Home / Self Care | Payer: Self-pay | Source: Ambulatory Visit | Attending: Internal Medicine

## 2017-09-12 ENCOUNTER — Ambulatory Visit (HOSPITAL_COMMUNITY): Payer: Self-pay | Admitting: Anesthesiology

## 2017-09-12 ENCOUNTER — Ambulatory Visit (HOSPITAL_COMMUNITY)
Admission: RE | Admit: 2017-09-12 | Discharge: 2017-09-12 | Disposition: A | Discharge status: Home / Self Care | Payer: Self-pay | Source: Ambulatory Visit | Attending: Internal Medicine | Admitting: Internal Medicine

## 2017-09-12 DIAGNOSIS — Z79899 Other long term (current) drug therapy: Secondary | ICD-10-CM | POA: Insufficient documentation

## 2017-09-12 DIAGNOSIS — F3111 Bipolar disorder, current episode manic without psychotic features, mild: Secondary | ICD-10-CM | POA: Insufficient documentation

## 2017-09-12 DIAGNOSIS — R1310 Dysphagia, unspecified: Secondary | ICD-10-CM | POA: Insufficient documentation

## 2017-09-12 DIAGNOSIS — K3189 Other diseases of stomach and duodenum: Secondary | ICD-10-CM | POA: Insufficient documentation

## 2017-09-12 DIAGNOSIS — K766 Portal hypertension: Secondary | ICD-10-CM | POA: Insufficient documentation

## 2017-09-12 DIAGNOSIS — K7460 Unspecified cirrhosis of liver: Secondary | ICD-10-CM | POA: Insufficient documentation

## 2017-09-12 DIAGNOSIS — B182 Chronic viral hepatitis C: Secondary | ICD-10-CM | POA: Insufficient documentation

## 2017-09-12 DIAGNOSIS — Z886 Allergy status to analgesic agent status: Secondary | ICD-10-CM | POA: Insufficient documentation

## 2017-09-12 DIAGNOSIS — Z880 Allergy status to penicillin: Secondary | ICD-10-CM | POA: Insufficient documentation

## 2017-09-12 DIAGNOSIS — K219 Gastro-esophageal reflux disease without esophagitis: Secondary | ICD-10-CM | POA: Insufficient documentation

## 2017-09-12 DIAGNOSIS — Z7951 Long term (current) use of inhaled steroids: Secondary | ICD-10-CM | POA: Insufficient documentation

## 2017-09-12 DIAGNOSIS — I10 Essential (primary) hypertension: Secondary | ICD-10-CM | POA: Insufficient documentation

## 2017-09-12 HISTORY — PX: ESOPHAGOGASTRODUODENOSCOPY (EGD) WITH PROPOFOL: SHX5813

## 2017-09-12 HISTORY — PX: BIOPSY: SHX5522

## 2017-09-12 HISTORY — PX: MALONEY DILATION: SHX5535

## 2017-09-12 SURGERY — ESOPHAGOGASTRODUODENOSCOPY (EGD) WITH PROPOFOL
Anesthesia: General

## 2017-09-12 MED ORDER — MIDAZOLAM HCL 5 MG/5ML IJ SOLN
INTRAMUSCULAR | Status: DC | PRN
  Administered 2017-09-12: 2 mg via INTRAVENOUS

## 2017-09-12 MED ORDER — LIDOCAINE VISCOUS 2 % MT SOLN
15.0000 mL | Freq: Once | OROMUCOSAL | Status: AC
  Administered 2017-09-12: 15 mL via OROMUCOSAL

## 2017-09-12 MED ORDER — LIDOCAINE VISCOUS 2 % MT SOLN
OROMUCOSAL | Status: AC
  Filled 2017-09-12: qty 15

## 2017-09-12 MED ORDER — LACTATED RINGERS IV SOLN
INTRAVENOUS | Status: DC
  Administered 2017-09-12: 09:00:00 via INTRAVENOUS

## 2017-09-12 MED ORDER — LIDOCAINE HCL (CARDIAC) PF 50 MG/5ML IV SOSY
PREFILLED_SYRINGE | INTRAVENOUS | Status: DC | PRN
  Administered 2017-09-12: 30 mg via INTRAVENOUS

## 2017-09-12 MED ORDER — PROPOFOL 500 MG/50ML IV EMUL
INTRAVENOUS | Status: DC | PRN
  Administered 2017-09-12: 150 ug/kg/min via INTRAVENOUS

## 2017-09-12 MED ORDER — LIDOCAINE HCL (PF) 1 % IJ SOLN
INTRAMUSCULAR | Status: AC
  Filled 2017-09-12: qty 5

## 2017-09-12 MED ORDER — PROPOFOL 10 MG/ML IV BOLUS
INTRAVENOUS | Status: AC
  Filled 2017-09-12: qty 60

## 2017-09-12 NOTE — Anesthesia Procedure Notes (Signed)
Procedure Name: MAC Date/Time: 09/12/2017 9:40 AM Performed by: Andree Elk Avangelina Flight A, CRNA Pre-anesthesia Checklist: Patient identified, Emergency Drugs available, Suction available, Patient being monitored and Timeout performed Patient Re-evaluated:Patient Re-evaluated prior to induction Oxygen Delivery Method: Simple face mask

## 2017-09-12 NOTE — Transfer of Care (Signed)
Immediate Anesthesia Transfer of Care Note  Patient: Rachel Hudson  Procedure(s) Performed: ESOPHAGOGASTRODUODENOSCOPY (EGD) WITH PROPOFOL (N/A ) MALONEY DILATION (N/A ) BIOPSY  Patient Location: PACU  Anesthesia Type:MAC  Level of Consciousness: awake, alert , oriented and patient cooperative  Airway & Oxygen Therapy: Patient Spontanous Breathing  Post-op Assessment: Report given to RN and Post -op Vital signs reviewed and stable  Post vital signs: Reviewed and stable  Last Vitals:  Vitals Value Taken Time  BP    Temp    Pulse 64 09/12/2017 10:03 AM  Resp    SpO2 92 % 09/12/2017 10:03 AM  Vitals shown include unvalidated device data.  Last Pain:  Vitals:   09/12/17 0907  TempSrc: Oral  PainSc: 0-No pain      Patients Stated Pain Goal: 7 (12/75/17 0017)  Complications: No apparent anesthesia complications

## 2017-09-12 NOTE — Anesthesia Postprocedure Evaluation (Signed)
Anesthesia Post Note  Patient: Rachel Hudson  Procedure(s) Performed: ESOPHAGOGASTRODUODENOSCOPY (EGD) WITH PROPOFOL (N/A ) MALONEY DILATION (N/A ) BIOPSY  Patient location during evaluation: PACU Anesthesia Type: General Level of consciousness: awake and alert, oriented and patient cooperative Pain management: pain level controlled Vital Signs Assessment: post-procedure vital signs reviewed and stable Respiratory status: spontaneous breathing and respiratory function stable Cardiovascular status: stable Postop Assessment: no apparent nausea or vomiting Anesthetic complications: no     Last Vitals:  Vitals:   09/12/17 0915 09/12/17 0920  BP: 115/68 116/67  Pulse:    Resp: 17 (!) 25  Temp:    SpO2: 96% 97%    Last Pain:  Vitals:   09/12/17 0907  TempSrc: Oral  PainSc: 0-No pain                 ADAMS, AMY A

## 2017-09-12 NOTE — Interval H&P Note (Signed)
History and Physical Interval Note:  09/12/2017 9:33 AM  Rachel Hudson  has presented today for surgery, with the diagnosis of GERD, cirrhosis, dysphagia  The various methods of treatment have been discussed with the patient and family. After consideration of risks, benefits and other options for treatment, the patient has consented to  Procedure(s) with comments: ESOPHAGOGASTRODUODENOSCOPY (EGD) WITH PROPOFOL (N/A) - 9:30am MALONEY DILATION (N/A) as a surgical intervention .  The patient's history has been reviewed, patient examined, no change in status, stable for surgery.  I have reviewed the patient's chart and labs.  Questions were answered to the patient's satisfaction.     Rachel Hudson  No change. No PPI currently. EGD with ED as feasible/appropriate per plan.  The risks, benefits, limitations, alternatives and imponderables have been reviewed with the patient. Potential for esophageal dilation, biopsy, etc. have also been reviewed.  Questions have been answered. All parties agreeable.

## 2017-09-12 NOTE — Op Note (Signed)
[facility] Patient Name: Rachel Hudson Procedure Date: 09/12/2017 9:28 AM MRN: [id number] Date of Birth: 1960-12-09 Attending MD: Gennette Pac , MD CSN: [id number] Age: 57 Admit Type: Outpatient Procedure:                Upper GI endoscopy Indications:              Dysphagia Providers:                Gennette Pac, MD, Nena Polio, RN, Dyann Ruddle Referring MD:              Medicines:                Propofol per Anesthesia Complications:            No immediate complications. Estimated Blood Loss:     Estimated blood loss was minimal. Procedure:                Pre-Anesthesia Assessment:                           - Prior to the procedure, a History and Physical                            was performed, and patient medications and                            allergies were reviewed. The patient's tolerance of                            previous anesthesia was also reviewed. The risks                            and benefits of the procedure and the sedation                            options and risks were discussed with the patient.                            All questions were answered, and informed consent                            was obtained. Prior Anticoagulants: The patient has                            taken no previous anticoagulant or antiplatelet                            agents. ASA Grade Assessment: II - A patient with                            mild systemic disease. After reviewing the risks  and benefits, the patient was deemed in                            satisfactory condition to undergo the procedure.                           After obtaining informed consent, the endoscope was                            passed under direct vision. Throughout the                            procedure, the patient's blood pressure, pulse, and                            oxygen saturations were monitored  continuously. The                            [id number] ([id number] scope was introduced through the                            and advanced to the second part of duodenum. The                            upper GI endoscopy was accomplished without                            difficulty. The patient tolerated the procedure                            well. Scope In: 9:48:00 AM Scope Out: 9:54:33 AM Total Procedure Duration: 0 hours 6 minutes 33 seconds  Findings:      .      Portal hypertensive gastropathy was found in the entire examined stomach.      Multiple erosions were found in the gastric antrum. The examined       esophagus was normal. The scope was withdrawn. Dilation was performed       with a Maloney dilator with mild resistance at 56 Fr. The dilation site       was examined and showed mild mucosal disruption. Estimated blood loss       was minimal. Finally, the abnormal antrum biopsied with a cold forceps       for histology. Estimated blood loss was minimal.      The duodenal bulb and second portion of the duodenum were normal. Impression:               - Normal esophagus. Dilated.                           - Portal hypertensive gastropathy.                           - Erosive gastropathy. Biopsied.                           - Normal duodenal bulb and  second portion of the                            duodenum. Moderate Sedation:      Moderate (conscious) sedation was personally administered by an       anesthesia professional. The following parameters were monitored: oxygen       saturation, heart rate, blood pressure, respiratory rate, EKG, adequacy       of pulmonary ventilation, and response to care. Total physician       intraservice time was 7 minutes. Recommendation:           - Patient has a contact number available for                            emergencies. The signs and symptoms of potential                            delayed complications were discussed with the                             patient. Return to normal activities tomorrow.                            Written discharge instructions were provided to the                            patient.                           - Resume previous diet.                           - Continue present medications. Begin Protonix 40                            mg once daily.                           - Repeat upper endoscopy in 2 years for screening                            purposes.                           - Return to GI office in 6 months. Procedure Code(s):        --- Professional ---                           [phone], Esophagogastroduodenoscopy, flexible,                            transoral; with biopsy, single or multiple                           43450, Dilation of esophagus, by unguided sound or  bougie, single or multiple passes Diagnosis Code(s):        --- Professional ---                           K76.6, Portal hypertension                           K31.89, Other diseases of stomach and duodenum                           R13.10, Dysphagia, unspecified CPT copyright 2017 American Medical Association. All rights reserved. The codes documented in this report are preliminary and upon coder review may  be revised to meet current compliance requirements. Gerrit Friends. Pierre Cumpton, MD Gennette Pac, MD 09/12/2017 10:06:14 AM This report has been signed electronically. Number of Addenda: 0

## 2017-09-12 NOTE — Discharge Instructions (Signed)
EGD Discharge instructions Please read the instructions outlined below and refer to this sheet in the next few weeks. These discharge instructions provide you with general information on caring for yourself after you leave the hospital. Your doctor may also give you specific instructions. While your treatment has been planned according to the most current medical practices available, unavoidable complications occasionally occur. If you have any problems or questions after discharge, please call your doctor. ACTIVITY  You may resume your regular activity but move at a slower pace for the next 24 hours.   Take frequent rest periods for the next 24 hours.   Walking will help expel (get rid of) the air and reduce the bloated feeling in your abdomen.   No driving for 24 hours (because of the anesthesia (medicine) used during the test).   You may shower.   Do not sign any important legal documents or operate any machinery for 24 hours (because of the anesthesia used during the test).  NUTRITION  Drink plenty of fluids.   You may resume your normal diet.   Begin with a light meal and progress to your normal diet.   Avoid alcoholic beverages for 24 hours or as instructed by your caregiver.  MEDICATIONS  You may resume your normal medications unless your caregiver tells you otherwise.  WHAT YOU CAN EXPECT TODAY  You may experience abdominal discomfort such as a feeling of fullness or gas pains.  FOLLOW-UP  Your doctor will discuss the results of your test with you.  SEEK IMMEDIATE MEDICAL ATTENTION IF ANY OF THE FOLLOWING OCCUR:  Excessive nausea (feeling sick to your stomach) and/or vomiting.   Severe abdominal pain and distention (swelling).   Trouble swallowing.   Temperature over 101 F (37.8 C).   Rectal bleeding or vomiting of blood.    GERD information provided  Resume Protonix 40 mg daily  Office visit in 6 months  Repeat EGD in 2 years to check for esophageal  varices    Gastroesophageal Reflux Disease, Adult Normally, food travels down the esophagus and stays in the stomach to be digested. If a person has gastroesophageal reflux disease (GERD), food and stomach acid move back up into the esophagus. When this happens, the esophagus becomes sore and swollen (inflamed). Over time, GERD can make small holes (ulcers) in the lining of the esophagus. Follow these instructions at home: Diet  Follow a diet as told by your doctor. You may need to avoid foods and drinks such as: ? Coffee and tea (with or without caffeine). ? Drinks that contain alcohol. ? Energy drinks and sports drinks. ? Carbonated drinks or sodas. ? Chocolate and cocoa. ? Peppermint and mint flavorings. ? Garlic and onions. ? Horseradish. ? Spicy and acidic foods, such as peppers, chili powder, curry powder, vinegar, hot sauces, and BBQ sauce. ? Citrus fruit juices and citrus fruits, such as oranges, lemons, and limes. ? Tomato-based foods, such as red sauce, chili, salsa, and pizza with red sauce. ? Fried and fatty foods, such as donuts, french fries, potato chips, and high-fat dressings. ? High-fat meats, such as hot dogs, rib eye steak, sausage, ham, and bacon. ? High-fat dairy items, such as whole milk, butter, and cream cheese.  Eat small meals often. Avoid eating large meals.  Avoid drinking large amounts of liquid with your meals.  Avoid eating meals during the 2-3 hours before bedtime.  Avoid lying down right after you eat.  Do not exercise right after you eat. General instructions  Pay attention to any changes in your symptoms.  Take over-the-counter and prescription medicines only as told by your doctor. Do not take aspirin, ibuprofen, or other NSAIDs unless your doctor says it is okay.  Do not use any tobacco products, including cigarettes, chewing tobacco, and e-cigarettes. If you need help quitting, ask your doctor.  Wear loose clothes. Do not wear anything  tight around your waist.  Raise (elevate) the head of your bed about 6 inches (15 cm).  Try to lower your stress. If you need help doing this, ask your doctor.  If you are overweight, lose an amount of weight that is healthy for you. Ask your doctor about a safe weight loss goal.  Keep all follow-up visits as told by your doctor. This is important. Contact a doctor if:  You have new symptoms.  You lose weight and you do not know why it is happening.  You have trouble swallowing, or it hurts to swallow.  You have wheezing or a cough that keeps happening.  Your symptoms do not get better with treatment.  You have a hoarse voice. Get help right away if:  You have pain in your arms, neck, jaw, teeth, or back.  You feel sweaty, dizzy, or light-headed.  You have chest pain or shortness of breath.  You throw up (vomit) and your throw up looks like blood or coffee grounds.  You pass out (faint).  Your poop (stool) is bloody or black.  You cannot swallow, drink, or eat. This information is not intended to replace advice given to you by your health care provider. Make sure you discuss any questions you have with your health care provider. Document Released: 10/27/2007 Document Revised: 10/16/2015 Document Reviewed: 09/04/2014 Elsevier Interactive Patient Education  2018 [employer].   PATIENT INSTRUCTIONS POST-ANESTHESIA  IMMEDIATELY FOLLOWING SURGERY:  Do not drive or operate machinery for the first twenty four hours after surgery.  Do not make any important decisions for twenty four hours after surgery or while taking narcotic pain medications or sedatives.  If you develop intractable nausea and vomiting or a severe headache please notify your doctor immediately.  FOLLOW-UP:  Please make an appointment with your surgeon as instructed. You do not need to follow up with anesthesia unless specifically instructed to do so.  WOUND CARE INSTRUCTIONS (if applicable):  Keep a dry  clean dressing on the anesthesia/puncture wound site if there is drainage.  Once the wound has quit draining you may leave it open to air.  Generally you should leave the bandage intact for twenty four hours unless there is drainage.  If the epidural site drains for more than 36-48 hours please call the anesthesia department.  QUESTIONS?:  Please feel free to call your physician or the hospital operator if you have any questions, and they will be happy to assist you.

## 2017-09-12 NOTE — Anesthesia Preprocedure Evaluation (Signed)
Anesthesia Evaluation  Patient identified by MRN, date of birth, ID band Patient awake    Reviewed: Allergy & Precautions, H&P , NPO status , Patient's Chart, lab work & pertinent test results  Airway Mallampati: I  TM Distance: >3 FB Neck ROM: full    Dental no notable dental hx.    Pulmonary neg pulmonary ROS,    Pulmonary exam normal breath sounds clear to auscultation       Cardiovascular Exercise Tolerance: Good hypertension, negative cardio ROS   Rhythm:regular Rate:Normal     Neuro/Psych PSYCHIATRIC DISORDERS Anxiety Bipolar Disorder  Neuromuscular disease negative neurological ROS  negative psych ROS   GI/Hepatic negative GI ROS, Neg liver ROS, PUD, GERD  ,(+) Hepatitis -, C  Endo/Other  negative endocrine ROS  Renal/GU negative Renal ROS  negative genitourinary   Musculoskeletal   Abdominal   Peds  Hematology negative hematology ROS (+)   Anesthesia Other Findings   Reproductive/Obstetrics negative OB ROS                             Anesthesia Physical Anesthesia Plan  ASA: III  Anesthesia Plan: General   Post-op Pain Management:    Induction:   PONV Risk Score and Plan: Propofol infusion  Airway Management Planned:   Additional Equipment:   Intra-op Plan:   Post-operative Plan:   Informed Consent: I have reviewed the patients History and Physical, chart, labs and discussed the procedure including the risks, benefits and alternatives for the proposed anesthesia with the patient or authorized representative who has indicated his/her understanding and acceptance.   Dental Advisory Given  Plan Discussed with: CRNA  Anesthesia Plan Comments:         Anesthesia Quick Evaluation

## 2017-09-14 ENCOUNTER — Other Ambulatory Visit: Payer: Self-pay | Admitting: Obstetrics and Gynecology

## 2017-09-14 ENCOUNTER — Telehealth: Payer: Self-pay | Admitting: Internal Medicine

## 2017-09-14 DIAGNOSIS — Z1231 Encounter for screening mammogram for malignant neoplasm of breast: Secondary | ICD-10-CM

## 2017-09-14 MED FILL — GABAPENTIN 800 MG TABLET: 800 | 30 days supply | Qty: 135 | Fill #4

## 2017-09-14 MED FILL — METHOCARBAMOL 500 MG TABS: 500 | 30 days supply | Qty: 30 | Fill #1

## 2017-09-14 MED FILL — ?DULoxetine HCL 20 MG CPEP: 20 | 30 days supply | Qty: 30 | Fill #1

## 2017-09-14 MED FILL — ?PANTOPRAZOLE SO DR 40MG TA: 40 | 30 days supply | Qty: 30 | Fill #0

## 2017-09-14 MED FILL — AMLODIPINE BESYLATE 10 MG T: 10 | 30 days supply | Qty: 30 | Fill #6

## 2017-09-14 NOTE — Telephone Encounter (Signed)
Will forward to pcp

## 2017-09-14 NOTE — Telephone Encounter (Signed)
Pt came to the office and drop up a DMV paper to be fill, Pt asking to sign the form for 6 year issued since her condition is permanent, if you have question to please call her

## 2017-09-16 ENCOUNTER — Encounter (HOSPITAL_COMMUNITY): Payer: Self-pay | Admitting: Internal Medicine

## 2017-09-16 NOTE — Telephone Encounter (Signed)
Patient stopped by office today to check up on her paperwork she dropped off. Please fu at your earliest convenience.

## 2017-09-17 ENCOUNTER — Encounter: Payer: Self-pay | Admitting: Internal Medicine

## 2017-09-19 NOTE — Telephone Encounter (Signed)
Pt notified of results

## 2017-09-29 ENCOUNTER — Encounter (HOSPITAL_COMMUNITY): Payer: Self-pay

## 2017-09-29 ENCOUNTER — Ambulatory Visit
Admission: RE | Admit: 2017-09-29 | Discharge: 2017-09-29 | Disposition: A | Discharge status: Home / Self Care | Payer: No Typology Code available for payment source | Source: Ambulatory Visit | Attending: Obstetrics and Gynecology | Admitting: Obstetrics and Gynecology

## 2017-09-29 ENCOUNTER — Ambulatory Visit (HOSPITAL_COMMUNITY)
Admission: RE | Admit: 2017-09-29 | Discharge: 2017-09-29 | Disposition: A | Discharge status: Home / Self Care | Payer: Self-pay | Source: Ambulatory Visit | Attending: Obstetrics and Gynecology | Admitting: Obstetrics and Gynecology

## 2017-09-29 VITALS — BP 112/68 | Ht 61.0 in

## 2017-09-29 DIAGNOSIS — Z1231 Encounter for screening mammogram for malignant neoplasm of breast: Secondary | ICD-10-CM

## 2017-09-29 DIAGNOSIS — Z01419 Encounter for gynecological examination (general) (routine) without abnormal findings: Secondary | ICD-10-CM

## 2017-09-29 NOTE — Progress Notes (Signed)
No complaints today.   Pap Smear: Pap smear completed today. Last Pap smear was 11/10/2015 at [facility] and [facility] with negative HPV. Per patient her last Pap smear is the only abnormal Pap smear she has had. Last Pap smear result is in Epic.  Physical exam: Breasts Breasts symmetrical. No skin abnormalities bilateral breasts. No nipple retraction bilateral breasts. No nipple discharge bilateral breasts. No lymphadenopathy. No lumps palpated bilateral breasts. No complaints of pain or tenderness on exam. Referred patient to the [facility] for a screening mammogram. Appointment scheduled for Thursday, Sep 29, 2017 at 0940.        Pelvic/Bimanual   Ext Genitalia No lesions, no swelling and no discharge observed on external genitalia.         Vagina Vagina pink and normal texture. No lesions or discharge observed in vagina.          Cervix Cervix is present. Cervix pink and of normal texture. No discharge observed.     Uterus Uterus is present and palpable. Uterus in normal position and normal size.        Adnexae Bilateral ovaries present and palpable. No tenderness on palpation.         Rectovaginal No rectal exam completed today since patient had no rectal complaints. No skin abnormalities observed on exam.    Smoking History: Patient has never smoked.  Patient Navigation: Patient education provided. Access to services provided for patient through [facility] program.   Colorectal Cancer Screening: Patient had a colonoscopy completed 07/08/2015. No complaints today. FIT Test given to patient to complete and return to [facility].  Breast and Cervical Cancer Risk Assessment: Patient has no family history of breast cancer, known genetic mutations, or radiation treatment to the chest before age 15. Patient has no history of cervical dysplasia, immunocompromised, or DES exposure in-utero.

## 2017-09-29 NOTE — Addendum Note (Signed)
Encounter addended by: Lynnell Dike, LPN on: 05/29/1094 11:12 AM  Actions taken: Order list changed

## 2017-09-29 NOTE — Patient Instructions (Signed)
Explained breast self awareness with [name]. Let patient know that if today's Pap smear is normal that her next Pap smear will be due in one year due to her last Pap smear was abnormal. Referred patient to the [facility] for a screening mammogram. Appointment scheduled for Thursday, Sep 29, 2017 at 0940. Let patient know will follow up with her within the next couple weeks with results of Pap smear by letter or phone. Informed patient that the Breast Center will follow-up with her within the next couple of weeks with results of mammogram by letter or phone. [name] verbalized understanding.  Gaither Biehn, Kathaleen Maser, RN 8:32 AM

## 2017-09-30 ENCOUNTER — Other Ambulatory Visit: Payer: Self-pay

## 2017-09-30 LAB — CYTOLOGY - PAP
Diagnosis: NEGATIVE
HPV (WINDOPATH): NOT DETECTED

## 2017-10-03 ENCOUNTER — Ambulatory Visit: Payer: No Typology Code available for payment source | Admitting: Podiatry

## 2017-10-04 ENCOUNTER — Other Ambulatory Visit: Payer: Self-pay

## 2017-10-05 ENCOUNTER — Encounter: Payer: Self-pay | Admitting: Podiatry

## 2017-10-05 ENCOUNTER — Ambulatory Visit (INDEPENDENT_AMBULATORY_CARE_PROVIDER_SITE_OTHER): Payer: No Typology Code available for payment source | Admitting: Podiatry

## 2017-10-05 DIAGNOSIS — M7752 Other enthesopathy of left foot: Secondary | ICD-10-CM

## 2017-10-05 DIAGNOSIS — M722 Plantar fascial fibromatosis: Secondary | ICD-10-CM

## 2017-10-05 DIAGNOSIS — M779 Enthesopathy, unspecified: Secondary | ICD-10-CM

## 2017-10-05 DIAGNOSIS — M7751 Other enthesopathy of right foot: Secondary | ICD-10-CM

## 2017-10-09 NOTE — Progress Notes (Signed)
   Subjective: Patient presents today for follow up evaluation of plantar fasciitis and 1st MPJ capsulitis of bilateral feet. She states her pain is the same. She is requesting injections for treatment since they have helped in the past. Walking and standing for long periods of time increases the pain. Patient is here for further evaluation and treatment.   Past Medical History:  Diagnosis Date  . Anxiety   . Arthritis   . 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 GERD (gastroesophageal reflux disease)   . Gout   . Hep C w/o coma, chronic (HCC) As of 10/22/13  . Hypertension      Objective: Physical Exam General: The patient is alert and oriented x3 in no acute distress.  Dermatology: Skin is warm, dry and supple bilateral lower extremities. Negative for open lesions or macerations bilateral.   Vascular: Dorsalis Pedis and Posterior Tibial pulses palpable bilateral.  Capillary fill time is immediate to all digits.  Neurological: Epicritic and protective threshold intact bilateral.   Musculoskeletal: Tenderness to palpation at the medial calcaneal tubercale and through the insertion of the plantar fascia and the 1st MPJs of the bilateral feet. All other joints range of motion within normal limits bilateral. Strength 5/5 in all groups bilateral.    Assessment: 1. plantar fasciitis bilateral feet 2. 1st MPJ capsulitis bilateral feet  Plan of Care:  1. Patient evaluated. 2. Injection of 0.5cc Celestone soluspan injected into the bilateral heels.  3. Injection of 0.5 mLs Celestone Soluspan injected into the 1st MPJ of the bilateral feet.  4. Patient wants surgery in the late fall for chronic plantar fasciitis.  5. Return to clinic in 6 weeks.    Felecia Shelling, DPM [facility]  Dr. Felecia Shelling, DPM    [address] [place], [place] [address]                Office [phone]  Fax [phone]

## 2017-10-12 ENCOUNTER — Inpatient Hospital Stay: Payer: Self-pay

## 2017-10-12 ENCOUNTER — Other Ambulatory Visit (HOSPITAL_COMMUNITY): Payer: Self-pay | Admitting: *Deleted

## 2017-10-12 ENCOUNTER — Inpatient Hospital Stay: Payer: Self-pay | Attending: Obstetrics and Gynecology | Admitting: *Deleted

## 2017-10-12 VITALS — BP 120/68 | Ht 60.0 in | Wt 167.7 lb

## 2017-10-12 DIAGNOSIS — Z0000 Encounter for general adult medical examination without abnormal findings: Secondary | ICD-10-CM

## 2017-10-12 MED FILL — GABAPENTIN 800 MG TABLET: 800 | 30 days supply | Qty: 135 | Fill #5

## 2017-10-12 NOTE — Progress Notes (Signed)
Lipid

## 2017-10-12 NOTE — Progress Notes (Signed)
[facility] initial screening  Clinical Measurement:  Height: 60in.    Weight:  167.7lb  Blood Pressure: 122/72  Blood Pressure #2:  120/68   Fasting Labs Drawn Today, will review with patient when they result.  Medical History:  Patient states that she has  been diagnosed with high blood pressure.  She states she has not been diagnosed with high cholesterol, diabetes or heart disease.  Medications:  Patients states she is  taking  medications for  high blood pressure .  She states she is not taking medications for high cholesterol or diabetes.  She is not taking aspirin daily to prevent heart attack or stroke.    Blood pressure, self measurement:  Patients states she does not measure blood pressure at home.  Nutrition:  Patient states she eats 5 cups of fruit and 3 cups of vegetables in an average day.  Patient states she does not eat fish regularly, she eats more than half a serving of whole grains daily. She drinks less than 36 ounces of beverages with added sugar weekly.  She is currently watching her sodium intake.  She has  had 1 drink containing alcohol in the last seven days.      Physical activity:  Patient states that she gets 300 minutes of moderate exercise in a week.  She gets 300 minutes of vigorous exercise per week.   Smoking status:  Patient states she has never smoked and is not around any smokers.     Quality of life:  Patient states that she has had 3  bad physical days out of the last 30 days. In the last 2 weeks, she has had 0 days that she has felt down or depressed. She has had 0 days in the last 2 weeks that she has had little interest or pleasure in doing things.  Risk reduction and counseling:  Patient states she wants to lose weight.  I encouraged her to continue with current exercise regimen and increase vegetable and fruit intake and also increase water consumption.  Navigation:  I will notify patient of lab results.  Patient is aware of 2 more health coaching sessions  and a follow up.

## 2017-10-15 LAB — FECAL OCCULT BLOOD, IMMUNOCHEMICAL: Fecal Occult Bld: NEGATIVE

## 2017-10-18 ENCOUNTER — Telehealth (HOSPITAL_COMMUNITY): Payer: Self-pay | Admitting: *Deleted

## 2017-10-18 NOTE — Telephone Encounter (Signed)
Health coaching 2  Met with patient for initial  screening.  Explained that there would be two labs drawn the same day. Walked patient to the lab and left patient in the lobby for the scheduled labs.  Checked labs within the same week and they were never drawn.  Patient left without lab work.Called patient and she stated that she did not want labs to be drawn.    Goals - patient states she will continue exercise and eating five or more servings of fruit and vegetables.

## 2017-10-19 ENCOUNTER — Encounter (HOSPITAL_COMMUNITY): Payer: Self-pay

## 2017-10-19 ENCOUNTER — Other Ambulatory Visit: Payer: Self-pay | Admitting: Internal Medicine

## 2017-10-19 DIAGNOSIS — R252 Cramp and spasm: Secondary | ICD-10-CM

## 2017-10-19 MED FILL — ?PANTOPRAZOLE SO DR 40MG TA: 40 | 30 days supply | Qty: 30 | Fill #1

## 2017-10-19 MED FILL — AMLODIPINE BESYLATE 10 MG T: 10 | 30 days supply | Qty: 30 | Fill #7

## 2017-10-25 ENCOUNTER — Encounter (HOSPITAL_COMMUNITY): Payer: Self-pay | Admitting: *Deleted

## 2017-10-31 ENCOUNTER — Other Ambulatory Visit (HOSPITAL_COMMUNITY): Payer: Self-pay | Admitting: *Deleted

## 2017-10-31 ENCOUNTER — Telehealth (HOSPITAL_COMMUNITY): Payer: Self-pay | Admitting: *Deleted

## 2017-10-31 MED ORDER — METRONIDAZOLE 500 MG PO TABS: 500.0000 mg | ORAL_TABLET | Freq: Two times a day (BID) | ORAL | 0 refills | Status: DC

## 2017-10-31 MED FILL — metroNIDAZOLE 500 MG TABS: 500 | 7 days supply | Qty: 14 | Fill #0

## 2017-10-31 NOTE — Telephone Encounter (Signed)
Telephoned patient at home and advised patient of negative pap smear results. HPV was negative. Next pap smear is due one year. Advised patient pap smear did show trichomoniasis. Medication will be called into [facility]. Advised patient to finish all medication, no sexual contact until all medication is finished, partner also needs to be treated, and no alcohol while taking medication. Patient voiced understanding.

## 2017-11-01 ENCOUNTER — Telehealth: Payer: Self-pay | Admitting: Internal Medicine

## 2017-11-01 MED FILL — METHOCARBAMOL 500 MG TABS: 500 | 30 days supply | Qty: 30 | Fill #0

## 2017-11-01 NOTE — Telephone Encounter (Signed)
Patient stopped by the office today to drop off her disability parking place card paperwork to be filled out. Patient stated pcp filled out paperwork before but it was not the correct form per [facility]mv. Please fu at your earliest convenience.

## 2017-11-01 NOTE — Telephone Encounter (Signed)
Contacted pt to inform her that her handicap placard is ready for pick up

## 2017-11-09 ENCOUNTER — Ambulatory Visit (INDEPENDENT_AMBULATORY_CARE_PROVIDER_SITE_OTHER): Payer: No Typology Code available for payment source | Admitting: Podiatry

## 2017-11-09 DIAGNOSIS — M779 Enthesopathy, unspecified: Secondary | ICD-10-CM

## 2017-11-09 DIAGNOSIS — M7752 Other enthesopathy of left foot: Secondary | ICD-10-CM

## 2017-11-09 DIAGNOSIS — M722 Plantar fascial fibromatosis: Secondary | ICD-10-CM

## 2017-11-09 DIAGNOSIS — M7751 Other enthesopathy of right foot: Secondary | ICD-10-CM

## 2017-11-14 ENCOUNTER — Other Ambulatory Visit: Payer: Self-pay | Admitting: Internal Medicine

## 2017-11-14 DIAGNOSIS — G5603 Carpal tunnel syndrome, bilateral upper limbs: Secondary | ICD-10-CM

## 2017-11-14 DIAGNOSIS — M47816 Spondylosis without myelopathy or radiculopathy, lumbar region: Secondary | ICD-10-CM

## 2017-11-14 MED FILL — AMLODIPINE BESYLATE 10 MG T: 10 | 30 days supply | Qty: 30 | Fill #8

## 2017-11-14 MED FILL — ?PANTOPRAZOLE SO DR 40MG TA: 40 | 30 days supply | Qty: 30 | Fill #2

## 2017-11-15 MED FILL — ?DULoxetine HCL 20 MG CPEP: 20 | 30 days supply | Qty: 30 | Fill #2

## 2017-11-15 NOTE — Progress Notes (Signed)
   Subjective: Patient presents today for follow up evaluation of plantar fasciitis and 1st MPJ capsulitis of bilateral feet. She states her pain is relatively unchanged since her previous visit. She is now interested in surgical intervention. She has been wearing the plantar fascial braces with minimal relief. Patient is here for further evaluation and treatment.   Past Medical History:  Diagnosis Date  . Anxiety   . Arthritis   . 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 GERD (gastroesophageal reflux disease)   . Gout   . Hep C w/o coma, chronic (HCC) As of 10/22/13  . Hypertension      Objective: Physical Exam General: The patient is alert and oriented x3 in no acute distress.  Dermatology: Skin is warm, dry and supple bilateral lower extremities. Negative for open lesions or macerations bilateral.   Vascular: Dorsalis Pedis and Posterior Tibial pulses palpable bilateral.  Capillary fill time is immediate to all digits.  Neurological: Epicritic and protective threshold intact bilateral.   Musculoskeletal: Tenderness to palpation at the medial calcaneal tubercale and through the insertion of the plantar fascia and the 1st MPJs of the bilateral feet. All other joints range of motion within normal limits bilateral. Strength 5/5 in all groups bilateral.    Assessment: 1. plantar fasciitis bilateral feet 2. 1st MPJ capsulitis bilateral feet  Plan of Care:  1. Patient evaluated. 2. Injection of 0.5cc Celestone soluspan injected into the bilateral heels.  3. Injection of 0.5 mLs Celestone Soluspan injected into the 1st MPJ of the bilateral feet.  4. Patient wants surgery in the late fall for chronic plantar fasciitis.  5. Continue wearing plantar fascial braces.  6. Return to clinic in 6 weeks.    Felecia ShellingBrent M. Evans, DPM [facility]  Dr. Felecia ShellingBrent M. Evans, DPM    [address]                                   [place], [place] [address]                  Office [phone]  Fax [phone]

## 2017-11-16 MED FILL — GABAPENTIN 800 MG TABLET: 800 | 30 days supply | Qty: 135 | Fill #0

## 2017-11-17 ENCOUNTER — Encounter: Payer: Self-pay | Admitting: Internal Medicine

## 2017-11-28 ENCOUNTER — Telehealth: Payer: Self-pay | Admitting: Internal Medicine

## 2017-11-28 NOTE — Telephone Encounter (Signed)
Pt came in to request a call from her nurse to better manage a medication she doesn't know the name of

## 2017-11-28 NOTE — Telephone Encounter (Signed)
Patient states she applied for an OC and never received any response back please follow up

## 2017-11-29 ENCOUNTER — Telehealth: Payer: Self-pay | Admitting: *Deleted

## 2017-11-29 ENCOUNTER — Other Ambulatory Visit: Payer: Self-pay | Admitting: Internal Medicine

## 2017-11-29 MED ORDER — METRONIDAZOLE 500 MG PO TABS: 500.0000 mg | ORAL_TABLET | Freq: Two times a day (BID) | ORAL | 0 refills | Status: DC

## 2017-11-29 NOTE — Telephone Encounter (Signed)
Patient verified DOB Patient was treated for trich on 10/31/17. Patient states partner was notified by HD but stated he would not report for treatment, that the patient had to bring him the treatment home. Patient's partner did not complete medication and when he returned home was intimate with patient. Patient now complains of itching and a discharge with discomfort and is wondering if she was re exposed. Patient would like to provide a urine sample to be retested.

## 2017-11-29 NOTE — Telephone Encounter (Signed)
Patient is aware of PCP treating her for Trich again and advised patient to refrain from having intercourse with the same partner, if he is refusing treatment.

## 2017-11-29 NOTE — Telephone Encounter (Signed)
Left message on voicemail to return call.

## 2017-11-29 NOTE — Telephone Encounter (Signed)
Per Dr. Laural BenesJohnson she will send in a rx for pt [facility]

## 2017-11-30 MED FILL — metroNIDAZOLE 500 MG TABS: 500 | 7 days supply | Qty: 14 | Fill #0

## 2017-12-07 ENCOUNTER — Ambulatory Visit: Payer: Self-pay

## 2017-12-07 MED FILL — METHOCARBAMOL 500 MG TABS: 500 | 30 days supply | Qty: 30 | Fill #1

## 2017-12-13 ENCOUNTER — Other Ambulatory Visit: Payer: Self-pay | Admitting: Internal Medicine

## 2017-12-13 DIAGNOSIS — I10 Essential (primary) hypertension: Secondary | ICD-10-CM

## 2017-12-13 MED FILL — ?DULoxetine HCL 20 MG CPEP: 20 | 30 days supply | Qty: 30 | Fill #3

## 2017-12-13 MED FILL — AMLODIPINE BESYLATE 10 MG T: 10 | 30 days supply | Qty: 30 | Fill #0

## 2017-12-13 MED FILL — GABAPENTIN 800 MG TABLET: 800 | 30 days supply | Qty: 135 | Fill #1

## 2017-12-13 MED FILL — ?PANTOPRAZOLE SO DR 40MG TA: 40 | 30 days supply | Qty: 30 | Fill #3

## 2017-12-21 ENCOUNTER — Ambulatory Visit: Payer: No Typology Code available for payment source | Admitting: Podiatry

## 2018-01-02 ENCOUNTER — Encounter: Payer: Self-pay | Admitting: Internal Medicine

## 2018-01-02 ENCOUNTER — Other Ambulatory Visit (HOSPITAL_COMMUNITY)
Admission: RE | Admit: 2018-01-02 | Discharge: 2018-01-02 | Disposition: A | Discharge status: Home / Self Care | Payer: Medicaid Other | Source: Ambulatory Visit | Attending: Internal Medicine | Admitting: Internal Medicine

## 2018-01-02 ENCOUNTER — Ambulatory Visit: Payer: Medicaid Other | Attending: Internal Medicine | Admitting: Internal Medicine

## 2018-01-02 VITALS — BP 119/69 | HR 59 | Temp 98.3°F | Resp 16 | Wt 159.8 lb

## 2018-01-02 DIAGNOSIS — G629 Polyneuropathy, unspecified: Secondary | ICD-10-CM | POA: Insufficient documentation

## 2018-01-02 DIAGNOSIS — Z01419 Encounter for gynecological examination (general) (routine) without abnormal findings: Secondary | ICD-10-CM | POA: Diagnosis not present

## 2018-01-02 DIAGNOSIS — Z886 Allergy status to analgesic agent status: Secondary | ICD-10-CM | POA: Insufficient documentation

## 2018-01-02 DIAGNOSIS — M25542 Pain in joints of left hand: Secondary | ICD-10-CM | POA: Diagnosis not present

## 2018-01-02 DIAGNOSIS — M25541 Pain in joints of right hand: Secondary | ICD-10-CM

## 2018-01-02 DIAGNOSIS — I10 Essential (primary) hypertension: Secondary | ICD-10-CM | POA: Insufficient documentation

## 2018-01-02 DIAGNOSIS — M19041 Primary osteoarthritis, right hand: Secondary | ICD-10-CM | POA: Insufficient documentation

## 2018-01-02 DIAGNOSIS — F319 Bipolar disorder, unspecified: Secondary | ICD-10-CM | POA: Diagnosis not present

## 2018-01-02 DIAGNOSIS — K219 Gastro-esophageal reflux disease without esophagitis: Secondary | ICD-10-CM | POA: Diagnosis not present

## 2018-01-02 DIAGNOSIS — Z1272 Encounter for screening for malignant neoplasm of vagina: Secondary | ICD-10-CM | POA: Diagnosis not present

## 2018-01-02 DIAGNOSIS — Z880 Allergy status to penicillin: Secondary | ICD-10-CM | POA: Diagnosis not present

## 2018-01-02 DIAGNOSIS — M47812 Spondylosis without myelopathy or radiculopathy, cervical region: Secondary | ICD-10-CM | POA: Diagnosis not present

## 2018-01-02 DIAGNOSIS — Z124 Encounter for screening for malignant neoplasm of cervix: Secondary | ICD-10-CM

## 2018-01-02 DIAGNOSIS — M19042 Primary osteoarthritis, left hand: Secondary | ICD-10-CM | POA: Insufficient documentation

## 2018-01-02 DIAGNOSIS — G8929 Other chronic pain: Secondary | ICD-10-CM | POA: Insufficient documentation

## 2018-01-02 DIAGNOSIS — G5603 Carpal tunnel syndrome, bilateral upper limbs: Secondary | ICD-10-CM | POA: Insufficient documentation

## 2018-01-02 DIAGNOSIS — R1310 Dysphagia, unspecified: Secondary | ICD-10-CM | POA: Insufficient documentation

## 2018-01-02 DIAGNOSIS — Z79899 Other long term (current) drug therapy: Secondary | ICD-10-CM | POA: Diagnosis not present

## 2018-01-02 DIAGNOSIS — B1920 Unspecified viral hepatitis C without hepatic coma: Secondary | ICD-10-CM | POA: Diagnosis not present

## 2018-01-02 DIAGNOSIS — L309 Dermatitis, unspecified: Secondary | ICD-10-CM

## 2018-01-02 MED ORDER — MELOXICAM 15 MG PO TABS: 15.0000 mg | ORAL_TABLET | Freq: Every day | ORAL | 0 refills | Status: DC

## 2018-01-02 MED ORDER — MUPIROCIN 2 % EX OINT: TOPICAL_OINTMENT | CUTANEOUS | 0 refills | Status: DC

## 2018-01-02 MED FILL — MUPIROCIN 2% OINTMENT: 2 | 7 days supply | Qty: 22 | Fill #0

## 2018-01-02 MED FILL — MELOXICAM 15 MG TABLET: 15 | 30 days supply | Qty: 30 | Fill #0

## 2018-01-02 NOTE — Progress Notes (Signed)
Patient ID: [name], female    DOB: 1961/01/21  MRN: [id number]  CC: Gynecologic Exam   Subjective: Rachel PineFrances Hudson is a 57 y.o. female who presents for PAP Her concerns today include:  Patient with history of HTN, hepatitis C(treated)with cirrhosis followed by GI, GERD and chronic lower back pain with hx surgery for spinal fixation of L4-L5.  Requesting refill on Bactroban ointment which she uses intermittently for boils on the skin.  She reports that she has a few coming on her arms and anterior chest.  PAP: Patient not aware of any family history of ovarian or uterine cancer.  She is adopted.   Reports intermittent pain in the ovaries.  She had pelvic ultrasound done back in September 2018 that revealed a uterine fibroid and no abnormalities in the ovaries.   Reports clear vaginal dischg. No itching.  Sexually active with one female partner.  Uses condoms. She is menopausal. No abnormal Pap smears in the past  Patient Active Problem List   Diagnosis Date Noted  . Bipolar affective disorder, currently manic, mild (HCC) 09/12/2017  . Dysphagia 08/24/2017  . Neuropathy of both feet 04/05/2017  . Status post lumbar spinal fusion 10/22/2016  . GERD (gastroesophageal reflux disease) 08/09/2016  . Spondylolisthesis of lumbar region 07/29/2016  . Sinus bradycardia 06/15/2016  . Gingivitis 06/15/2016  . ASCUS favor benign 11/13/2015  . History of hepatitis C 10/13/2015  . Bilateral carpal tunnel syndrome 09/16/2015  . Cervical spondylosis without myelopathy 09/16/2015  . Primary osteoarthritis of both hands 09/16/2015  . Diverticulosis of colon without hemorrhage   . Peptic ulcer disease   . 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 Lumbago 11/18/2014  . Carpal tunnel syndrome 11/18/2014  . Hepatic cirrhosis (HCC) 08/06/2014  . HTN (hypertension) 02/28/2014     Current Outpatient Medications on File Prior to Visit  Medication Sig  Dispense Refill  . amLODipine (NORVASC) 10 MG tablet TAKE 1 TABLET BY MOUTH DAILY 30 tablet 0  . DULoxetine (CYMBALTA) 20 MG capsule Take 1 capsule (20 mg total) by mouth daily. 30 capsule 3  . fluticasone (FLONASE) 50 MCG/ACT nasal spray Place 2 sprays into both nostrils daily. (Patient taking differently: Place 1 spray into both nostrils daily. ) 16 g 6  . gabapentin (NEURONTIN) 800 MG tablet TAKE 1 & 1/2 TABLETS BY MOUTH 3 TIMES DAILY 135 tablet 5  . Lecith-Inosi-Chol-B12-Liver (LIVERITE) 1.125 MCG TABS Take 1 tablet by mouth 2 (two) times daily.     . methocarbamol (ROBAXIN) 500 MG tablet TAKE 1 TABLET (500 MG TOTAL) BY MOUTH DAILY AS NEEDED FOR MUSCLE SPASMS. 30 tablet 1  . metroNIDAZOLE (FLAGYL) 500 MG tablet Take 1 tablet (500 mg total) by mouth 2 (two) times daily. 14 tablet 0  . milk thistle 175 MG tablet Take 175 mg by mouth daily.    . Multiple Vitamin (MULTIVITAMIN WITH MINERALS) TABS tablet Take 1 tablet by mouth daily.    . Omega-3 Fatty Acids (FISH OIL) 1000 MG CAPS Take 1,000 mg by mouth daily.    . pantoprazole (PROTONIX) 20 MG tablet TAKE 1 TABLET BY MOUTH DAILY  5  . Tetrahydrozoline HCl (VISINE OP) Place 1 drop into both eyes daily as needed (allergies).     No current facility-administered medications on file prior to visit.     Allergies  Allergen Reactions  . Penicillins     Facial swelling Has patient had a PCN reaction causing immediate rash, facial/tongue/throat  swelling, SOB or lightheadedness with hypotension: Yes Has patient had a PCN reaction causing severe rash involving mucus membranes or skin necrosis: No Has patient had a PCN reaction that required hospitalization No Has patient had a PCN reaction occurring within the last 10 years: Yes If all of the above answers are "NO", then may proceed with Cephalosporin use.   . Tylenol [Acetaminophen] Other (See Comments)    HX of Hep. C    Social History   Socioeconomic History  . Marital status: Single     Spouse name: Not on file  . Number of children: Not on file  . Years of education: Not on file  . Highest education level: Not on file  Occupational History  . Not on file  Social Needs  . Financial resource strain: Not on file  . Food insecurity:    Worry: Not on file    Inability: Not on file  . Transportation needs:    Medical: Not on file    Non-medical: Not on file  Tobacco Use  . Smoking status: Never Smoker  . Smokeless tobacco: Never Used  Substance and Sexual Activity  . Alcohol use: Not Currently    Alcohol/week: 0.0 standard drinks    Comment: occasionally  . Drug use: Yes    Frequency: 7.0 times per week    Types: Marijuana    Comment: daily for arthritis  . Sexual activity: Not Currently    Birth control/protection: None  Lifestyle  . Physical activity:    Days per week: Not on file    Minutes per session: Not on file  . Stress: Not on file  Relationships  . Social connections:    Talks on phone: Not on file    Gets together: Not on file    Attends religious service: Not on file    Active member of club or organization: Not on file    Attends meetings of clubs or organizations: Not on file    Relationship status: Not on file  . Intimate partner violence:    Fear of current or ex partner: Not on file    Emotionally abused: Not on file    Physically abused: Not on file    Forced sexual activity: Not on file  Other Topics Concern  . Not on file  Social History Narrative  . Not on file    Family History  Adopted: Yes  Problem Relation Age of Onset  . Colon cancer Neg Hx     Past Surgical History:  Procedure Laterality Date  . BACK SURGERY     lumbar fusion  . BIOPSY  09/12/2017   Procedure: BIOPSY;  Surgeon: Corbin Adeourk, Robert M, MD;  [facility];  Service: Endoscopy;;  gastric  . CARPAL TUNNEL RELEASE Right 11/13/2015   Procedure: RIGHT CARPAL TUNNEL RELEASE;  Surgeon: Betha LoaKevin Kuzma, MD;  Location: [facility];  Service:  Orthopedics;  Laterality: Right;  . CARPAL TUNNEL RELEASE Left 01/29/2016   Procedure: left CARPAL TUNNEL RELEASE;  Surgeon: Betha LoaKevin Kuzma, MD;  Location: [facility];  Service: Orthopedics;  Laterality: Left;  . COLONOSCOPY N/A 07/08/2015   Procedure: COLONOSCOPY;  Surgeon: Corbin Adeobert M Rourk, MD;  [facility]: [facility];  Service: Endoscopy;  Laterality: N/A;  230  . ESOPHAGOGASTRODUODENOSCOPY N/A 07/08/2015   Procedure: ESOPHAGOGASTRODUODENOSCOPY (EGD);  Surgeon: Corbin Adeobert M Rourk, MD;  [facility]: [facility];  Service: Endoscopy;  Laterality: N/A;  . ESOPHAGOGASTRODUODENOSCOPY (EGD) WITH PROPOFOL N/A 09/12/2017  Procedure: ESOPHAGOGASTRODUODENOSCOPY (EGD) WITH PROPOFOL;  Surgeon: Corbin Ade, MD;  [facility];  Service: Endoscopy;  Laterality: N/A;  9:30am  . LIGAMENT REPAIR Right 1980   Knee  . MALONEY DILATION N/A 09/12/2017   Procedure: Elease Hashimoto DILATION;  Surgeon: Corbin Ade, MD;  [facility]: [facility];  Service: Endoscopy;  Laterality: N/A;    ROS: Review of Systems MSK: Requesting a medication for arthritis in her hands.  She has known carpal tunnel syndrome for which she underwent bilateral carpal tunnel release.  She is still having symptoms of carpal tunnel syndrome despite having the surgery last year PHYSICAL EXAM: BP 119/69   Pulse (!) 59   Temp 98.3 F (36.8 C) (Oral)   Resp 16   Wt 159 lb 12.8 oz (72.5 kg)   LMP 09/29/2016 (Approximate)   SpO2 95%   BMI 31.21 kg/m   Physical Exam  General appearance - alert, well appearing, and in no distress Mental status - normal mood, behavior, speech, dress, motor activity, and thought processes Pelvic - normal external genitalia, vulva, vagina, cervix, uterus and adnexa Musculoskeletal -wearing compressive gloves on hands Skin - several small erythematous excoriated areas on forearms and anterior chest   ASSESSMENT AND PLAN: 1. Pap smear for cervical cancer screening - Cytology - PAP  2.  Dermatitis - mupirocin ointment (BACTROBAN) 2 %; Apply to affected area BID  Dispense: 22 g; Refill: 0  3. Arthralgia of both hands - meloxicam (MOBIC) 15 MG tablet; Take 1 tablet (15 mg total) by mouth daily.  Dispense: 30 tablet; Refill: 0   Patient was given the opportunity to ask questions.  Patient verbalized understanding of the plan and was able to repeat key elements of the plan.   No orders of the defined types were placed in this encounter.    Requested Prescriptions   Signed Prescriptions Disp Refills  . meloxicam (MOBIC) 15 MG tablet 30 tablet 0    Sig: Take 1 tablet (15 mg total) by mouth daily.  . mupirocin ointment (BACTROBAN) 2 % 22 g 0    Sig: Apply to affected area BID    Return in about 3 months (around 04/04/2018).  Jonah Blue, MD, FACP

## 2018-01-03 LAB — CYTOLOGY - PAP
CANDIDA VAGINITIS: POSITIVE — AB
CHLAMYDIA, DNA PROBE: NEGATIVE
Diagnosis: NEGATIVE
HPV: NOT DETECTED
Neisseria Gonorrhea: NEGATIVE
TRICH (WINDOWPATH): NEGATIVE

## 2018-01-04 ENCOUNTER — Ambulatory Visit: Payer: Medicaid Other | Admitting: Podiatry

## 2018-01-04 ENCOUNTER — Other Ambulatory Visit: Payer: Self-pay | Admitting: Internal Medicine

## 2018-01-04 ENCOUNTER — Telehealth: Payer: Self-pay

## 2018-01-04 DIAGNOSIS — M722 Plantar fascial fibromatosis: Secondary | ICD-10-CM | POA: Diagnosis not present

## 2018-01-04 DIAGNOSIS — M7751 Other enthesopathy of right foot: Secondary | ICD-10-CM

## 2018-01-04 DIAGNOSIS — M7752 Other enthesopathy of left foot: Secondary | ICD-10-CM

## 2018-01-04 DIAGNOSIS — M21611 Bunion of right foot: Secondary | ICD-10-CM

## 2018-01-04 DIAGNOSIS — M21612 Bunion of left foot: Secondary | ICD-10-CM

## 2018-01-04 MED ORDER — FLUCONAZOLE 150 MG PO TABS: 150.0000 mg | ORAL_TABLET | Freq: Once | ORAL | 0 refills | Status: AC

## 2018-01-04 MED FILL — FLUCONAZOLE 150 MG TABS: 150 | 1 days supply | Qty: 1 | Fill #0

## 2018-01-04 NOTE — Patient Instructions (Signed)
Pre-Operative Instructions  Congratulations, you have decided to take an important step towards improving your quality of life.  You can be assured that the doctors and staff at [facility] will be with you every step of the way.  Here are some important things you should know:  1. Plan to be at the surgery center/hospital at least 1 (one) hour prior to your scheduled time, unless otherwise directed by the surgical center/hospital staff.  You must have a responsible adult accompany you, remain during the surgery and drive you home.  Make sure you have directions to the surgical center/hospital to ensure you arrive on time. 2. If you are having surgery at [facility] or [facility] hospitals, you will need a copy of your medical history and physical form from your family physician within one month prior to the date of surgery. We will give you a form for your primary physician to complete.  3. We make every effort to accommodate the date you request for surgery.  However, there are times where surgery dates or times have to be moved.  We will contact you as soon as possible if a change in schedule is required.   4. No aspirin/ibuprofen for one week before surgery.  If you are on aspirin, any non-steroidal anti-inflammatory medications (Mobic, Aleve, Ibuprofen) should not be taken seven (7) days prior to your surgery.  You make take Tylenol for pain prior to surgery.  5. Medications - If you are taking daily heart and blood pressure medications, seizure, reflux, allergy, asthma, anxiety, pain or diabetes medications, make sure you notify the surgery center/hospital before the day of surgery so they can tell you which medications you should take or avoid the day of surgery. 6. No food or drink after midnight the night before surgery unless directed otherwise by surgical center/hospital staff. 7. No alcoholic beverages 24-hours prior to surgery.  No smoking 24-hours prior or 24-hours after  surgery. 8. Wear loose pants or shorts. They should be loose enough to fit over bandages, boots, and casts. 9. Don't wear slip-on shoes. Sneakers are preferred. 10. Bring your boot with you to the surgery center/hospital.  Also bring crutches or a walker if your physician has prescribed it for you.  If you do not have this equipment, it will be provided for you after surgery. 11. If you have not been contacted by the surgery center/hospital by the day before your surgery, call to confirm the date and time of your surgery. 12. Leave-time from work may vary depending on the type of surgery you have.  Appropriate arrangements should be made prior to surgery with your employer. 13. Prescriptions will be provided immediately following surgery by your doctor.  Fill these as soon as possible after surgery and take the medication as directed. Pain medications will not be refilled on weekends and must be approved by the doctor. 14. Remove nail polish on the operative foot and avoid getting pedicures prior to surgery. 15. Wash the night before surgery.  The night before surgery wash the foot and leg well with water and the antibacterial soap provided. Be sure to pay special attention to beneath the toenails and in between the toes.  Wash for at least three (3) minutes. Rinse thoroughly with water and dry well with a towel.  Perform this wash unless told not to do so by your physician.  Enclosed: 1 Ice pack (please put in freezer the night before surgery)   1 Hibiclens skin cleaner     Pre-op instructions  If you have any questions regarding the instructions, please do not hesitate to call our office.  [place]: [address], [place], [place] [address] -- [phone]  [place]: [address] [place], [place] [address] -- [phone]  [place]: [address]  [place], [place] [address] -- [phone]  [place]: [address], [place], [place] [address] -- [phone]  Website:  [email or web address] 

## 2018-01-04 NOTE — Telephone Encounter (Signed)
Contacted pt to go over pap results pt is aware and doesn't have any questions or concerns  

## 2018-01-06 NOTE — Progress Notes (Signed)
   Subjective: 57 year old female presenting today for follow up evaluation of bilateral plantar fasciitis. She states she is doing better and was able to ride her bicycle here today from 20 minutes away. She denies any new complaints at this time. Patient is here for further evaluation and treatment.   Past Medical History:  Diagnosis Date  . Anxiety   . Arthritis   . 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 GERD (gastroesophageal reflux disease)   . Gout   . Hep C w/o coma, chronic (HCC) As of 10/22/13  . Hypertension       Objective: Physical Exam General: The patient is alert and oriented x3 in no acute distress.  Dermatology: Skin is cool, dry and supple bilateral lower extremities. Negative for open lesions or macerations.  Vascular: Palpable pedal pulses bilaterally. No edema or erythema noted. Capillary refill within normal limits.  Neurological: Epicritic and protective threshold grossly intact bilaterally.   Musculoskeletal Exam: Clinical evidence of bunion deformity noted to the respective foot. There is a moderate pain on palpation range of motion of the first MPJ. Lateral deviation of the hallux noted consistent with hallux abductovalgus. Tenderness to palpation to the plantar aspect of the bilateral heels along the plantar fascia as well as the 1st MPJ capsulitis of bilateral feet.    Assessment: 1. HAV w/ bunion deformity bilateral 2. Plantar fasciitis bilateral 3. 1st MPJ capsulitis bilateral     Plan of Care:  1. Patient was evaluated.  2. Today we discussed the conservative versus surgical management of the presenting pathology. The patient opts for surgical management. All possible complications and details of the procedure were explained. All patient questions were answered. No guarantees were expressed or implied. 3. Authorization for surgery was initiated today. Surgery will consist of bunionectomy with metatarsal osteotomy bilateral; EPF  bilateral.  4. Injection of 0.5 mLs Celestone Soluspan injected into the 1st MPJ of bilateral feet.  5. Injection of 0.5 mLs Celestone Soluspan injected into the bilateral heels.  6. Return to clinic one week post op.   Felecia ShellingBrent M. Evans, DPM [facility]  Dr. Felecia ShellingBrent M. Evans, DPM    [address]                                        [place], [place] [address]                Office [phone]  Fax [phone]

## 2018-01-16 ENCOUNTER — Other Ambulatory Visit: Payer: Self-pay | Admitting: Internal Medicine

## 2018-01-16 DIAGNOSIS — I10 Essential (primary) hypertension: Secondary | ICD-10-CM

## 2018-01-16 MED FILL — GABAPENTIN 800 MG TABLET: 800 | 30 days supply | Qty: 135 | Fill #2

## 2018-01-16 MED FILL — AMLODIPINE BESYLATE 10 MG T: 10 | 30 days supply | Qty: 30 | Fill #0

## 2018-01-16 MED FILL — PANTOPRAZOLE SOD DR 40 MG T: 40 | 30 days supply | Qty: 30 | Fill #4

## 2018-01-18 ENCOUNTER — Other Ambulatory Visit: Payer: Self-pay | Admitting: Internal Medicine

## 2018-01-18 DIAGNOSIS — R252 Cramp and spasm: Secondary | ICD-10-CM

## 2018-02-01 MED FILL — MELOXICAM 15 MG TABLET: 15 | 30 days supply | Qty: 30 | Fill #1

## 2018-02-01 MED FILL — METHOCARBAMOL 500 MG TABS: 500 | 30 days supply | Qty: 30 | Fill #0

## 2018-02-15 ENCOUNTER — Other Ambulatory Visit: Payer: Self-pay | Admitting: Internal Medicine

## 2018-02-15 DIAGNOSIS — G6289 Other specified polyneuropathies: Secondary | ICD-10-CM

## 2018-02-15 MED FILL — PANTOPRAZOLE SOD DR 40 MG T: 40 | 30 days supply | Qty: 30 | Fill #5

## 2018-02-15 MED FILL — AMLODIPINE BESYLATE 10 MG T: 10 | 30 days supply | Qty: 30 | Fill #1

## 2018-02-15 MED FILL — GABAPENTIN 800 MG TABLET: 800 | 30 days supply | Qty: 135 | Fill #3

## 2018-02-16 MED FILL — DULoxetine HCL 20 MG CPEP: 20 | 30 days supply | Qty: 30 | Fill #0

## 2018-02-23 ENCOUNTER — Ambulatory Visit: Payer: Self-pay | Admitting: Nurse Practitioner

## 2018-02-24 ENCOUNTER — Ambulatory Visit (INDEPENDENT_AMBULATORY_CARE_PROVIDER_SITE_OTHER): Payer: Medicaid Other | Admitting: Nurse Practitioner

## 2018-02-24 ENCOUNTER — Encounter: Payer: Self-pay | Admitting: Nurse Practitioner

## 2018-02-24 ENCOUNTER — Other Ambulatory Visit: Payer: Self-pay

## 2018-02-24 VITALS — BP 110/76 | HR 55 | Temp 97.3°F | Ht 61.0 in | Wt 158.8 lb

## 2018-02-24 DIAGNOSIS — K219 Gastro-esophageal reflux disease without esophagitis: Secondary | ICD-10-CM

## 2018-02-24 DIAGNOSIS — R1310 Dysphagia, unspecified: Secondary | ICD-10-CM

## 2018-02-24 DIAGNOSIS — K7460 Unspecified cirrhosis of liver: Secondary | ICD-10-CM

## 2018-02-24 NOTE — Assessment & Plan Note (Signed)
GERD seems to be somewhat worsening over the past month or so.  She is currently only on Protonix 20 mg once daily.  After EGD previous he recommended Protonix 40 mg daily.  I will send in a new prescription to her pharmacy.  Follow-up in 6 months, call if any worsening symptoms or no improvement before then.

## 2018-02-24 NOTE — Patient Instructions (Addendum)
1. Have your labs completed when you are able to. 2. We will help schedule your swallowing test for you. 3. We will help schedule your ultrasound for you. 4. Recommendations below related to softening foods to help with swallowing issues. 5. Increase her Protonix to 40 mg daily.  A prescription has been sent to your pharmacy. 6. Follow-up in 6 months. 7. Call us if you have any questions or concerns; call us if you have any worsening symptoms before then  At [facility] we value your feedback. You may receive a survey about your visit today. Please share your experience as we strive to create trusting relationships with our patients to provide genuine, compassionate, quality care.  We appreciate your understanding and patience as we review any laboratory studies, imaging, and other diagnostic tests that are ordered as we care for you. Our office policy is 5 business days for review of these results, and any emergent or urgent results are addressed in a timely manner for your best interest. If you do not hear from our office in 1 week, please contact us.   We also encourage the use of MyChart, which contains your medical information for your review as well. If you are not enrolled in this feature, an access code is on this after visit summary for your convenience. Thank you for allowing [name] to be involved in your care.  It was great to see you today!  I hope you have a great Fall!!     Dysphagia Diet Level 3, Mechanically Advanced The dysphagia level 3 diet includes foods that are soft, moist, and can be chopped into 1-inch chunks. This diet is helpful for people with mild swallowing difficulties. It reduces the risk of food getting caught in the windpipe, trachea, or lungs. What do I need to know about this diet?  You may eat foods that are soft and moist.  If you were on the dysphagia level 1 or level 2 diets, you may eat any of the foods included on those lists.  Avoid  foods that are dry, hard, sticky, chewy, coarse, and crunchy. Also avoid large cuts of food.  Take small bites. Each bite should contain 1 inch or less of food.  Thicken liquids if instructed by your health care provider. Follow your health care provider's instructions on how to do this and to what consistency.  See your dietitian or speech language pathologist regularly for help with your dietary changes. What foods can I eat? Grains Moist breads without nuts or seeds. Biscuits, muffins, pancakes, and waffles well-moistened with syrup, jelly, margarine, or butter. Smooth cereals with plenty of milk to moisten them. Moist bread stuffing. Moist rice. Vegetables All cooked, soft vegetables. Shredded lettuce. Tender fried potatoes. Fruits All canned and cooked fruits. Soft, peeled fresh fruits, such as peaches, nectarines, kiwis, cantaloupe, honeydew melon, and watermelon without seeds. Soft berries, such as strawberries. Meat and Other Protein Sources Moist ground or finely diced or sliced meats. Solid, tender cuts of meat. Meatloaf. Hamburger with a bun. Sausage patty. Deli thin-sliced lunch meat. Chicken, egg, or tuna salad sandwich. Sloppy joe. Moist fish. Eggs prepared any way. Casseroles with small chunks of meats, ground meats, or tender meats. Dairy Cheese spreads without coarse large chunks. Shredded cheese. Cheese slices. Cottage cheese. Milk at the right texture. Smooth frappes. Yogurt without nuts or coconut. Ask your health care provider whether you can have frozen desserts (such as malts or milk shakes) and thin liquids. Sweets/Desserts Soft, smooth, moist  desserts. Non-chewy, smooth candy. Jam. Jelly. Honey. Preserves. Ask your health care provider whether you can have frozen desserts. Fats and Oils Butter. Oils. Margarine. Mayonnaise. Gravy. Spreads. Other All seasonings and sweeteners. All sauces without large chunks. The items listed above may not be a complete list of  recommended foods or beverages. Contact your dietitian for more options. What foods are not recommended? Grains Coarse or dry cereals. Dry breads. Toast. Crackers. Tough, crusty breads, such French bread and baguettes. Tough, crisp fried potatoes. Potato skins. Dry bread stuffing. Granola. Popcorn. Chips. Vegetables All raw vegetables except shredded lettuce. Cooked corn. Rubbery or stiff cooked vegetables. Stringy vegetables, such as celery. Fruits Hard fruits that are difficult to chew, such as apples or pears. Stringy, high-pulp fruits, such as pineapple, papaya, or mango. Fruits with tough skins, such as grapes. Coconut. All dried fruits. Fruit leather. Fruit roll-ups. Fruit snacks. Meat and Other Protein Sources Dry or tough meats or poultry. Dry fish. Fish with bones. Peanut butter. All nuts and seeds. Dairy Any with nuts, seeds, chocolate chips, dried fruit, coconut, or pineapple. Sweets/Desserts Dry cakes. Chewy or dry cookies. Any with nuts, seeds, dry fruits, coconut, pineapple, or anything dry, sticky, or hard. Chewy caramel. Licorice. Taffy-type candies. Ask your health care provider whether you can have frozen desserts. Fats and Oils Any with chunks, nuts, seeds, or pineapple. Olives. Rosita Fire. Other Soups with tough or large chunks of meats, poultry, or vegetables. Corn or clam chowder. The items listed above may not be a complete list of foods and beverages to avoid. Contact your dietitian for more information. This information is not intended to replace advice given to you by your health care provider. Make sure you discuss any questions you have with your health care provider. Document Released: 05/10/2005 Document Revised: 10/16/2015 Document Reviewed: 04/23/2013 Elsevier Interactive Patient Education  [employer].

## 2018-02-24 NOTE — Progress Notes (Signed)
Referring Provider: Marcine Matar, MD Primary Care Physician:  Marcine Matar, MD Primary GI:  Dr. Jena Gauss  Chief Complaint  Patient presents with  . Gastroesophageal Reflux    f/u.  Marland Kitchen Dysphagia    feels like food (meats/breads) are getting stuck in throat  . Cirrhosis    HPI:   Rachel Hudson is a 57 y.o. female who presents for follow-up on GERD and cirrhosis.  The patient was last seen in our office 08/24/2017 for the same as well as dysphasia.  History of chronic hepatitis C.  Colonoscopy and endoscopy up-to-date and due for repeat EGD in February 2019 and colonoscopy in February 2027.  Generally well compensated historically with a meld of 6 and child Pugh class A.  At her last visit she has been doing well overall.  Very active recently with yard work.  Right sided pain with known hepatomegaly, eating well recently.  GERD well controlled on PPI with occasional breakthrough with specific dietary indiscretions.  Previous alcoholic by her own admission.  Difficulty swallowing secretions or beverages and "may be" some solid food dysphagia.  Significant dry mouth likely contributing.  No other GI symptoms.  Recommended updated labs and imaging for cirrhosis, schedule EGD with possible dilation, continue current medications, follow-up in 6 months.  Labs completed 09/06/2017 found mild leukocytosis with 11.7 otherwise normal CBC, mild hypokalemia at 3.4 otherwise normal CMP, normal INR, normal AFP.  Meld score 6, child Pugh A.  Right upper quadrant ultrasound completed 08/29/2017 found stable complex cystic area in the left lobe of the liver, nodular contour consistent with cirrhosis, no suspicious masses.  Otherwise unremarkable.  EGD completed 09/12/2017 found normal esophagus status post dilation, portal hypertensive gastropathy, erosive gastropathy status post biopsy, normal duodenum.  Surgical pathology found the biopsies to be antral mucosa with mild hyperemia; negative for H.  Pylori; negative for metaplasia, dysplasia, malignancy.  Recommended begin Protonix 40 mg daily, repeat EGD in 2 years for screening.  Today she states she's doing well overall. GERD symptoms have started flaring recently, about a month ago. She is taking Protonix 20 mg daily. GERD symptoms are mostly belching with "air getting stuck there." Her swallowing has started acting up in the past month, mostly with solid foods, minimal to no problems with fluids. No pill dysphagia. Denies abdominal pain, N/V, hematochezia, melena, fever, unintentional weight loss. Eats a lot of fiber. Denies yellowing of skin/eyes, darkened urine, acute episodic confusion, generalized pruritis. Denies chest pain, dyspnea, dizziness, lightheadedness, syncope, near syncope. Denies any other upper or lower GI symptoms.  She still drinks occasionally. Has been doing more "partying" since May, has consumed about a fifth of liquor in total in the past 5 months. Her Hepatitis C was treated in [place] at the ID clinic 5 years ago and was documented SVR (repeatedly, last un-detectable RNA 05/30/2015).  Past Medical History:  Diagnosis Date  . Anxiety   . Arthritis   . 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 GERD (gastroesophageal reflux disease)   . Gout   . Hep C w/o coma, chronic (HCC) As of 10/22/13  . Hypertension     Past Surgical History:  Procedure Laterality Date  . BACK SURGERY     lumbar fusion  . BIOPSY  09/12/2017   Procedure: BIOPSY;  Surgeon: Corbin Ade, MD;  [facility];  Service: Endoscopy;;  gastric  . CARPAL TUNNEL RELEASE Right 11/13/2015   Procedure: RIGHT  CARPAL TUNNEL RELEASE;  Surgeon: Betha Loa, MD;  Location: [facility];  Service: Orthopedics;  Laterality: Right;  . CARPAL TUNNEL RELEASE Left 01/29/2016   Procedure: left CARPAL TUNNEL RELEASE;  Surgeon: Betha Loa, MD;  Location: [facility];  Service: Orthopedics;  Laterality: Left;  .  COLONOSCOPY N/A 07/08/2015   Procedure: COLONOSCOPY;  Surgeon: Corbin Ade, MD;  [facility]: [facility];  Service: Endoscopy;  Laterality: N/A;  230  . ESOPHAGOGASTRODUODENOSCOPY N/A 07/08/2015   Procedure: ESOPHAGOGASTRODUODENOSCOPY (EGD);  Surgeon: Corbin Ade, MD;  [facility];  Service: Endoscopy;  Laterality: N/A;  . ESOPHAGOGASTRODUODENOSCOPY (EGD) WITH PROPOFOL N/A 09/12/2017   Procedure: ESOPHAGOGASTRODUODENOSCOPY (EGD) WITH PROPOFOL;  Surgeon: Corbin Ade, MD;  [facility];  Service: Endoscopy;  Laterality: N/A;  9:30am  . LIGAMENT REPAIR Right 1980   Knee  . MALONEY DILATION N/A 09/12/2017   Procedure: Elease Hashimoto DILATION;  Surgeon: Corbin Ade, MD;  Location: [facility];  Service: Endoscopy;  Laterality: N/A;    Current Outpatient Medications  Medication Sig Dispense Refill  . amLODipine (NORVASC) 10 MG tablet TAKE 1 TABLET BY MOUTH DAILY 30 tablet 2  . gabapentin (NEURONTIN) 800 MG tablet TAKE 1 & 1/2 TABLETS BY MOUTH 3 TIMES DAILY 135 tablet 5  . Lecith-Inosi-Chol-B12-Liver (LIVERITE) 1.125 MCG TABS Take 1 tablet by mouth 2 (two) times daily.     . meloxicam (MOBIC) 15 MG tablet Take 1 tablet (15 mg total) by mouth daily. 30 tablet 0  . methocarbamol (ROBAXIN) 500 MG tablet TAKE 1 TABLET (500 MG TOTAL) BY MOUTH DAILY AS NEEDED FOR MUSCLE SPASMS. 30 tablet 1  . milk thistle 175 MG tablet Take 175 mg by mouth daily.    . Multiple Vitamin (MULTIVITAMIN WITH MINERALS) TABS tablet Take 1 tablet by mouth daily.    . mupirocin ointment (BACTROBAN) 2 % Apply to affected area BID 22 g 0  . Omega-3 Fatty Acids (FISH OIL) 1000 MG CAPS Take 1,000 mg by mouth daily.    . pantoprazole (PROTONIX) 20 MG tablet TAKE 1 TABLET BY MOUTH DAILY  5  . Tetrahydrozoline HCl (VISINE OP) Place 1 drop into both eyes daily as needed (allergies).     No current facility-administered medications for this visit.     Allergies as of 02/24/2018 - Review Complete 02/24/2018    Allergen Reaction Noted  . Penicillins  03/28/2011  . Tylenol [acetaminophen] Other (See Comments) 07/14/2014    Family History  Adopted: Yes  Problem Relation Age of Onset  . Colon cancer Neg Hx     Social History   Socioeconomic History  . Marital status: Single    Spouse name: Not on file  . Number of children: Not on file  . Years of education: Not on file  . Highest education level: Not on file  Occupational History  . Not on file  Social Needs  . Financial resource strain: Not on file  . Food insecurity:    Worry: Not on file    Inability: Not on file  . Transportation needs:    Medical: Not on file    Non-medical: Not on file  Tobacco Use  . Smoking status: Never Smoker  . Smokeless tobacco: Never Used  Substance and Sexual Activity  . Alcohol use: Not Currently    Alcohol/week: 0.0 standard drinks    Comment: occasionally  . Drug use: Yes    Frequency: 7.0 times per week  Types: Marijuana    Comment: daily for arthritis  . Sexual activity: Not Currently    Birth control/protection: None  Lifestyle  . Physical activity:    Days per week: Not on file    Minutes per session: Not on file  . Stress: Not on file  Relationships  . Social connections:    Talks on phone: Not on file    Gets together: Not on file    Attends religious service: Not on file    Active member of club or organization: Not on file    Attends meetings of clubs or organizations: Not on file    Relationship status: Not on file  Other Topics Concern  . Not on file  Social History Narrative  . Not on file    Review of Systems: General: Negative for anorexia, weight loss, fever, chills, fatigue, weakness. ENT: Negative for hoarseness, difficulty swallowing CV: Negative for chest pain, angina, palpitations, peripheral edema.  Respiratory: Negative for dyspnea at rest, cough, sputum, wheezing.  GI: See history of present illness. Endo: Negative for unusual weight change.  Heme:  Negative for bruising or bleeding. Allergy: Negative for rash or hives.   Physical Exam: BP 110/76   Pulse (!) 55   Temp (!) 97.3 F (36.3 C) (Oral)   Ht 5\' 1"  (1.549 m)   Wt 158 lb 12.8 oz (72 kg)   LMP 09/29/2016 (Approximate)   BMI 30.00 kg/m  General:   Alert and oriented. Pleasant and cooperative. Well-nourished and well-developed.  Eyes:  Without icterus, sclera clear and conjunctiva pink.  Ears:  Normal auditory acuity. Cardiovascular:  S1, S2 present without murmurs appreciated. Extremities without clubbing or edema. Respiratory:  Clear to auscultation bilaterally. No wheezes, rales, or rhonchi. No distress.  Gastrointestinal:  +BS, soft, non-tender and non-distended. No HSM noted. No guarding or rebound. No masses appreciated.  Rectal:  Deferred  Musculoskalatal:  Symmetrical without gross deformities. Neurologic:  Alert and oriented x4;  grossly normal neurologically. Psych:  Alert and cooperative. Normal mood and affect. Heme/Lymph/Immune: No excessive bruising noted.    02/24/2018 10:43 AM   Disclaimer: This note was dictated with voice recognition software. Similar sounding words can inadvertently be transcribed and may not be corrected upon review.

## 2018-02-24 NOTE — Assessment & Plan Note (Signed)
History of recurrent solid food dysphagia.  She is again having dysphasia symptoms over the past month.  Her EGD was just recently completed on 09/12/2017 with dilation despite normal-appearing esophagus.  She is due for repeat EGD in 2 years.  At this point I will check a barium pill esophagram for any functional issues rather than an early interval endoscopy.  We will provide dysphasia 3 recommendations.  Tentatively follow-up in 6 months, further recommendations to follow.

## 2018-02-24 NOTE — Patient Instructions (Signed)
[name] abd RUQ approved via [employer]. PA# [id number], 02/24/18-03/26/18.

## 2018-02-24 NOTE — Assessment & Plan Note (Signed)
History of hepatic cirrhosis with previous alcoholism and previous hepatitis C now status post SVR as of the past 3 to 4 years.  Historically and currently her liver disease is well compensated with a meld score typically around 6, child Pugh C.  We will complete routine liver imaging and labs to follow her cirrhosis and hepatic function.  Follow-up in 6 months.

## 2018-02-27 NOTE — Progress Notes (Signed)
CC'D TO PCP °

## 2018-03-01 MED FILL — METHOCARBAMOL 500 MG TABS: 500 | 30 days supply | Qty: 30 | Fill #1

## 2018-03-01 MED FILL — MELOXICAM 15 MG TABLET: 15 | 30 days supply | Qty: 30 | Fill #2

## 2018-03-02 ENCOUNTER — Ambulatory Visit (HOSPITAL_COMMUNITY)
Admission: RE | Admit: 2018-03-02 | Discharge: 2018-03-02 | Disposition: A | Discharge status: Home / Self Care | Payer: Medicaid Other | Source: Ambulatory Visit | Attending: Nurse Practitioner | Admitting: Nurse Practitioner

## 2018-03-02 DIAGNOSIS — R1310 Dysphagia, unspecified: Secondary | ICD-10-CM | POA: Diagnosis not present

## 2018-03-02 DIAGNOSIS — K219 Gastro-esophageal reflux disease without esophagitis: Secondary | ICD-10-CM

## 2018-03-02 DIAGNOSIS — K7460 Unspecified cirrhosis of liver: Secondary | ICD-10-CM

## 2018-03-02 DIAGNOSIS — K449 Diaphragmatic hernia without obstruction or gangrene: Secondary | ICD-10-CM | POA: Diagnosis not present

## 2018-03-02 LAB — CBC WITH DIFFERENTIAL/PLATELET
Abs Immature Granulocytes: 0.02 10*3/uL (ref 0.00–0.07)
BASOS ABS: 0 10*3/uL (ref 0.0–0.1)
Basophils Relative: 1 %
Eosinophils Absolute: 0.2 10*3/uL (ref 0.0–0.5)
Eosinophils Relative: 4 %
HEMATOCRIT: 41.2 % (ref 36.0–46.0)
HEMOGLOBIN: 13.4 g/dL (ref 12.0–15.0)
IMMATURE GRANULOCYTES: 0 %
LYMPHS ABS: 2.5 10*3/uL (ref 0.7–4.0)
Lymphocytes Relative: 42 %
MCH: 29.7 pg (ref 26.0–34.0)
MCHC: 32.5 g/dL (ref 30.0–36.0)
MCV: 91.4 fL (ref 80.0–100.0)
Monocytes Absolute: 0.5 10*3/uL (ref 0.1–1.0)
Monocytes Relative: 8 %
NEUTROS PCT: 45 %
NRBC: 0 % (ref 0.0–0.2)
Neutro Abs: 2.7 10*3/uL (ref 1.7–7.7)
Platelets: 151 10*3/uL (ref 150–400)
RBC: 4.51 MIL/uL (ref 3.87–5.11)
RDW: 14.4 % (ref 11.5–15.5)
WBC: 5.9 10*3/uL (ref 4.0–10.5)

## 2018-03-02 LAB — COMPREHENSIVE METABOLIC PANEL
ALBUMIN: 4.5 g/dL (ref 3.5–5.0)
ALK PHOS: 57 U/L (ref 38–126)
ALT: 18 U/L (ref 0–44)
ANION GAP: 14 (ref 5–15)
AST: 24 U/L (ref 15–41)
BILIRUBIN TOTAL: 0.6 mg/dL (ref 0.3–1.2)
BUN: 10 mg/dL (ref 6–20)
CHLORIDE: 101 mmol/L (ref 98–111)
CO2: 25 mmol/L (ref 22–32)
Calcium: 9.3 mg/dL (ref 8.9–10.3)
Creatinine, Ser: 0.77 mg/dL (ref 0.44–1.00)
Glucose, Bld: 91 mg/dL (ref 70–99)
POTASSIUM: 3.8 mmol/L (ref 3.5–5.1)
Sodium: 140 mmol/L (ref 135–145)
Total Protein: 7.5 g/dL (ref 6.5–8.1)

## 2018-03-02 LAB — PROTIME-INR
INR: 1.11
PROTHROMBIN TIME: 14.2 s (ref 11.4–15.2)

## 2018-03-03 LAB — AFP TUMOR MARKER: AFP, Serum, Tumor Marker: 2.9 ng/mL (ref 0.0–8.3)

## 2018-03-07 ENCOUNTER — Other Ambulatory Visit: Payer: Self-pay

## 2018-03-07 DIAGNOSIS — R1310 Dysphagia, unspecified: Secondary | ICD-10-CM

## 2018-03-07 MED ORDER — PANTOPRAZOLE SODIUM 40 MG PO TBEC: 40.0000 mg | DELAYED_RELEASE_TABLET | Freq: Every day | ORAL | 3 refills | Status: DC

## 2018-03-21 ENCOUNTER — Other Ambulatory Visit: Payer: Self-pay

## 2018-03-21 DIAGNOSIS — R1310 Dysphagia, unspecified: Secondary | ICD-10-CM

## 2018-03-21 MED FILL — DULoxetine HCL 20 MG CPEP: 20 | 30 days supply | Qty: 30 | Fill #1

## 2018-03-21 MED FILL — GABAPENTIN 800 MG TABLET: 800 | 30 days supply | Qty: 135 | Fill #4

## 2018-03-21 MED FILL — AMLODIPINE BESYLATE 10 MG T: 10 | 30 days supply | Qty: 30 | Fill #2

## 2018-03-27 MED FILL — PANTOPRAZOLE SOD DR 40 MG T: 40 | 30 days supply | Qty: 30 | Fill #0

## 2018-03-30 MED FILL — MELOXICAM 15 MG TABLET: 15 | 30 days supply | Qty: 30 | Fill #3

## 2018-04-10 ENCOUNTER — Emergency Department (HOSPITAL_COMMUNITY): Payer: Medicaid Other

## 2018-04-10 ENCOUNTER — Other Ambulatory Visit: Payer: Self-pay

## 2018-04-10 ENCOUNTER — Emergency Department (HOSPITAL_COMMUNITY)
Admission: EM | Admit: 2018-04-10 | Discharge: 2018-04-10 | Disposition: A | Discharge status: Home / Self Care | Payer: Medicaid Other | Attending: Emergency Medicine | Admitting: Emergency Medicine

## 2018-04-10 DIAGNOSIS — Z23 Encounter for immunization: Secondary | ICD-10-CM | POA: Insufficient documentation

## 2018-04-10 DIAGNOSIS — Z79899 Other long term (current) drug therapy: Secondary | ICD-10-CM | POA: Insufficient documentation

## 2018-04-10 DIAGNOSIS — Y929 Unspecified place or not applicable: Secondary | ICD-10-CM | POA: Insufficient documentation

## 2018-04-10 DIAGNOSIS — I10 Essential (primary) hypertension: Secondary | ICD-10-CM | POA: Insufficient documentation

## 2018-04-10 DIAGNOSIS — W540XXA Bitten by dog, initial encounter: Secondary | ICD-10-CM | POA: Diagnosis not present

## 2018-04-10 DIAGNOSIS — Y999 Unspecified external cause status: Secondary | ICD-10-CM | POA: Insufficient documentation

## 2018-04-10 DIAGNOSIS — S61412A Laceration without foreign body of left hand, initial encounter: Secondary | ICD-10-CM | POA: Diagnosis not present

## 2018-04-10 DIAGNOSIS — S61452A Open bite of left hand, initial encounter: Secondary | ICD-10-CM

## 2018-04-10 DIAGNOSIS — Y939 Activity, unspecified: Secondary | ICD-10-CM | POA: Insufficient documentation

## 2018-04-10 MED ORDER — OXYCODONE HCL 5 MG PO TABS
5.0000 mg | ORAL_TABLET | Freq: Once | ORAL | Status: AC
  Administered 2018-04-10: 5 mg via ORAL
  Filled 2018-04-10: qty 1

## 2018-04-10 MED ORDER — CLINDAMYCIN HCL 150 MG PO CAPS
300.0000 mg | ORAL_CAPSULE | Freq: Once | ORAL | Status: AC
  Administered 2018-04-10: 300 mg via ORAL
  Filled 2018-04-10: qty 2

## 2018-04-10 MED ORDER — CLINDAMYCIN HCL 300 MG PO CAPS: 300.0000 mg | ORAL_CAPSULE | Freq: Four times a day (QID) | ORAL | 0 refills | Status: DC

## 2018-04-10 MED ORDER — LIDOCAINE HCL (PF) 1 % IJ SOLN
5.0000 mL | Freq: Once | INTRAMUSCULAR | Status: AC
  Administered 2018-04-10: 5 mL
  Filled 2018-04-10: qty 5

## 2018-04-10 MED ORDER — OXYCODONE HCL 5 MG PO TABS: 5.0000 mg | ORAL_TABLET | Freq: Four times a day (QID) | ORAL | 0 refills | Status: DC | PRN

## 2018-04-10 MED ORDER — TETANUS-DIPHTH-ACELL PERTUSSIS 5-2.5-18.5 LF-MCG/0.5 IM SUSP
0.5000 mL | Freq: Once | INTRAMUSCULAR | Status: AC
  Administered 2018-04-10: 0.5 mL via INTRAMUSCULAR
  Filled 2018-04-10: qty 0.5

## 2018-04-10 NOTE — ED Triage Notes (Signed)
Per pt she was has two dog and they were trying to get her attention more than the other and they got into it and one bit her accidentally. She stated both have had there vaccinations.

## 2018-04-10 NOTE — ED Provider Notes (Addendum)
[facility] EMERGENCY DEPARTMENT Provider Note   CSN: [id number] Arrival date & time: 04/10/18  2038     History   Chief Complaint Chief Complaint  Patient presents with  . Animal Bite    HPI Rachel Hudson is a 57 y.o. female.  No language interpreter was used.  Animal Bite  Contact animal:  Dog Location:  Hand Hand injury location:  L hand Time since incident:  1 hour Pain details:    Quality:  Aching   Severity:  Moderate   Timing:  Constant   Progression:  Partially resolved Incident location:  Home Provoked: provoked   Animal's rabies vaccination status:  Up to date Animal in possession: yes   Tetanus status:  Out of date Relieved by:  Nothing Worsened by:  Nothing Pt reports she got between 2 dogs and left hand was bitten   Past Medical History:  Diagnosis Date  . Anxiety   . Arthritis   . Cirrhosis of liver [facility]) September 2015   Stage 4  . GERD (gastroesophageal reflux disease)   . Gout   . Hep C w/o coma, chronic (HCC) As of 10/22/13  . Hypertension     Patient Active Problem List   Diagnosis Date Noted  . Bipolar affective disorder, currently manic, mild (HCC) 09/12/2017  . Dysphagia 08/24/2017  . Neuropathy of both feet 04/05/2017  . Status post lumbar spinal fusion 10/22/2016  . GERD (gastroesophageal reflux disease) 08/09/2016  . Spondylolisthesis of lumbar region 07/29/2016  . Sinus bradycardia 06/15/2016  . Gingivitis 06/15/2016  . ASCUS favor benign 11/13/2015  . History of hepatitis C 10/13/2015  . Bilateral carpal tunnel syndrome 09/16/2015  . Cervical spondylosis without myelopathy 09/16/2015  . Primary osteoarthritis of both hands 09/16/2015  . Diverticulosis of colon without hemorrhage   . Peptic ulcer disease   . 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 Lumbago 11/18/2014  . Carpal tunnel syndrome 11/18/2014  . Hepatic cirrhosis (HCC) 08/06/2014  . HTN (hypertension)  02/28/2014    Past Surgical History:  Procedure Laterality Date  . BACK SURGERY     lumbar fusion  . BIOPSY  09/12/2017   Procedure: BIOPSY;  Surgeon: Corbin Ade, MD;  [facility];  Service: Endoscopy;;  gastric  . CARPAL TUNNEL RELEASE Right 11/13/2015   Procedure: RIGHT CARPAL TUNNEL RELEASE;  Surgeon: Betha Loa, MD;  Location: [facility];  Service: Orthopedics;  Laterality: Right;  . CARPAL TUNNEL RELEASE Left 01/29/2016   Procedure: left CARPAL TUNNEL RELEASE;  Surgeon: Betha Loa, MD;  Location: [facility];  Service: Orthopedics;  Laterality: Left;  . COLONOSCOPY N/A 07/08/2015   Procedure: COLONOSCOPY;  Surgeon: Corbin Ade, MD;  [facility];  Service: Endoscopy;  Laterality: N/A;  230  . ESOPHAGOGASTRODUODENOSCOPY N/A 07/08/2015   Procedure: ESOPHAGOGASTRODUODENOSCOPY (EGD);  Surgeon: Corbin Ade, MD;  [facility];  Service: Endoscopy;  Laterality: N/A;  . ESOPHAGOGASTRODUODENOSCOPY (EGD) WITH PROPOFOL N/A 09/12/2017   Procedure: ESOPHAGOGASTRODUODENOSCOPY (EGD) WITH PROPOFOL;  Surgeon: Corbin Ade, MD;  [facility];  Service: Endoscopy;  Laterality: N/A;  9:30am  . LIGAMENT REPAIR Right 1980   Knee  . MALONEY DILATION N/A 09/12/2017   Procedure: Elease Hashimoto DILATION;  Surgeon: Corbin Ade, MD;  [facility];  Service: Endoscopy;  Laterality: N/A;     OB History   None      Home Medications  Prior to Admission medications   Medication Sig Start Date End Date Taking? Authorizing Provider  amLODipine (NORVASC) 10 MG tablet TAKE 1 TABLET BY MOUTH DAILY 01/16/18   Marcine MatarJohnson, Deborah B, MD  clindamycin (CLEOCIN) 300 MG capsule Take 1 capsule (300 mg total) by mouth every 6 (six) hours. 04/10/18   Elson AreasSofia, Clark Clowdus K, PA-C  gabapentin (NEURONTIN) 800 MG tablet TAKE 1 & 1/2 TABLETS BY MOUTH 3 TIMES DAILY 11/15/17   Marcine MatarJohnson, Deborah B, MD  Lecith-Inosi-Chol-B12-Liver (LIVERITE) 1.125 MCG  TABS Take 1 tablet by mouth 2 (two) times daily.     [provider]  meloxicam (MOBIC) 15 MG tablet Take 1 tablet (15 mg total) by mouth daily. 01/02/18   Marcine MatarJohnson, Deborah B, MD  methocarbamol (ROBAXIN) 500 MG tablet TAKE 1 TABLET (500 MG TOTAL) BY MOUTH DAILY AS NEEDED FOR MUSCLE SPASMS. 01/19/18   Marcine MatarJohnson, Deborah B, MD  milk thistle 175 MG tablet Take 175 mg by mouth daily.    [provider]  Multiple Vitamin (MULTIVITAMIN WITH MINERALS) TABS tablet Take 1 tablet by mouth daily.    [provider]  mupirocin ointment (BACTROBAN) 2 % Apply to affected area BID 01/02/18   Marcine MatarJohnson, Deborah B, MD  Omega-3 Fatty Acids (FISH OIL) 1000 MG CAPS Take 1,000 mg by mouth daily.    [provider]  oxyCODONE (ROXICODONE) 5 MG immediate release tablet Take 1 tablet (5 mg total) by mouth every 6 (six) hours as needed for severe pain. 04/10/18   Elson AreasSofia, Jonica Bickhart K, PA-C  pantoprazole (PROTONIX) 20 MG tablet TAKE 1 TABLET BY MOUTH DAILY 09/14/17   [provider]  pantoprazole (PROTONIX) 40 MG tablet Take 1 tablet (40 mg total) by mouth daily. 03/07/18   Anice PaganiniGill, Eric A, NP  Tetrahydrozoline HCl (VISINE OP) Place 1 drop into both eyes daily as needed (allergies).    [provider]    Family History Family History  Adopted: Yes  Problem Relation Age of Onset  . Colon cancer Neg Hx     Social History Social History   Tobacco Use  . Smoking status: Never Smoker  . Smokeless tobacco: Never Used  Substance Use Topics  . Alcohol use: Not Currently    Alcohol/week: 0.0 standard drinks    Comment: occasionally  . Drug use: Yes    Frequency: 7.0 times per week    Types: Marijuana    Comment: daily for arthritis     Allergies   Penicillins and Tylenol [acetaminophen]   Review of Systems Review of Systems  All other systems reviewed and are negative.    Physical Exam Updated Vital Signs BP 109/66 (BP Location: Right Arm)   Pulse 62   Temp 98.3  F (36.8 C) (Oral)   Resp 18   Ht 5\' 1"  (1.549 m)   Wt 70.8 kg   LMP 09/29/2016 (Approximate)   SpO2 98%   BMI 29.48 kg/m   Physical Exam  Constitutional: She appears well-developed and well-nourished.  HENT:  Head: Normocephalic.  Musculoskeletal: She exhibits tenderness.  2cm laceration left hand,  From,  Good strength.  Normal sensation   Neurological: She is alert.  Psychiatric: She has a normal mood and affect.  Nursing note and vitals reviewed.    ED Treatments / Results  Labs (all labs ordered are listed, but only abnormal results are displayed) Labs Reviewed - No data to display  EKG None  Radiology Dg Hand Complete Left  Result Date: 04/10/2018 CLINICAL DATA:  Laceration  of the medial left hand from dog bite. EXAM: LEFT HAND - COMPLETE 3+ VIEW COMPARISON:  None. FINDINGS: Soft tissue swelling with subcutaneous emphysema is seen along the ulnar aspect of the left hand at fifth metacarpal level. No radiopaque foreign body. Osteoarthritis of the DIP and PIP joints of the second through fifth digits and interphalangeal joint of the thumb. Minimal marginal erosive change at the base of the second through fifth proximal phalanges. Carpal rows are maintained. No acute fracture or suspicious osseous lesions. IMPRESSION: 1. Soft tissue emphysema and swelling along the ulnar aspect of the left hand consistent with history of dog bite injury. No radiopaque foreign body nor acute osseous abnormality. 2. Probable mix of inflammatory and osteoarthritic change of the hand. Electronically Signed   By: Tollie Eth M.D.   On: 04/10/2018 22:28    Procedures .Marland KitchenLaceration Repair Date/Time: 04/10/2018 11:06 PM Performed by: Elson Areas, PA-C Authorized by: Elson Areas, PA-C   Consent:    Consent obtained:  Verbal   Consent given by:  Patient   Risks discussed:  Infection Anesthesia (see MAR for exact dosages):    Anesthesia method:  Local infiltration Laceration details:      Location:  Hand   Hand location:  L palm   Length (cm):  2   Depth (mm):  5 Exploration:    Wound exploration: wound explored through full range of motion and entire depth of wound probed and visualized   Treatment:    Area cleansed with:  Betadine   Amount of cleaning:  Standard   Irrigation solution:  Sterile saline   Irrigation method:  Syringe   Visualized foreign bodies/material removed: no   Post-procedure details:    Patient tolerance of procedure:  Tolerated well, no immediate complications Comments:     Wound irrigated and debrided   No tendon injury,  No sign vascular injury  Wound left open to prevent infection   (including critical care time)  Medications Ordered in ED Medications  clindamycin (CLEOCIN) capsule 300 mg (has no administration in time range)  Tdap (BOOSTRIX) injection 0.5 mL (0.5 mLs Intramuscular Given 04/10/18 2204)  oxyCODONE (Oxy IR/ROXICODONE) immediate release tablet 5 mg (5 mg Oral Given 04/10/18 2203)  lidocaine (PF) (XYLOCAINE) 1 % injection 5 mL (5 mLs Infiltration Given 04/10/18 2233)     Initial Impression / Assessment and Plan / ED Course  I have reviewed the triage vital signs and the nursing notes.  Pertinent labs & imaging results that were available during my care of the patient were reviewed by me and considered in my medical decision making (see chart for details).    Pt counseled on animal bites.  Pt given oxycodone and clindamycin here.  Pt given rx for both.  Pt advised to recheck at Urgent care in 2 days.    Final Clinical Impressions(s) / ED Diagnoses   Final diagnoses:  Dog bite of left hand, initial encounter    ED Discharge Orders         Ordered    clindamycin (CLEOCIN) 300 MG capsule  Every 6 hours     04/10/18 2303    oxyCODONE (ROXICODONE) 5 MG immediate release tablet  Every 6 hours PRN     04/10/18 2303        An After Visit Summary was printed and given to the patient.    Osie Cheeks 04/10/18 2309    Raeford Razor, MD 04/11/18 1613    Keenan Bachelor,  Lonia Skinner, PA-C 05/03/18 1418    Raeford Razor, MD 05/04/18 [phone]

## 2018-04-10 NOTE — Discharge Instructions (Signed)
Recheck at Urgent care in 2 days  

## 2018-04-12 ENCOUNTER — Telehealth: Payer: Self-pay | Admitting: *Deleted

## 2018-04-12 ENCOUNTER — Ambulatory Visit (HOSPITAL_COMMUNITY)
Admission: EM | Admit: 2018-04-12 | Discharge: 2018-04-12 | Disposition: A | Discharge status: Home / Self Care | Payer: Medicaid Other

## 2018-04-12 NOTE — Telephone Encounter (Signed)
"  I need to reschedule my surgery."  What date are you scheduled for?  "I'm scheduled for tomorrow.  I was told by Dr. Kipp BroodBrent that I had to reschedule because I have an open wound.  I got bit by a dog."  Dr. Logan BoresEvans can do it on December 20.  "Okay, that will be fine.  As long as it's not months away I'm fine.  What time do I need to be there?"  Someone from the surgical center will give you a call a day or two prior to your surgery date.  They will give you your arrival time.  I changed the date in the surgical center's [employer] portal.

## 2018-04-13 NOTE — Telephone Encounter (Signed)
Pt's postop appointments have been rescheduled.

## 2018-04-19 ENCOUNTER — Encounter: Payer: Medicaid Other | Admitting: Podiatry

## 2018-04-25 ENCOUNTER — Other Ambulatory Visit: Payer: Self-pay | Admitting: Internal Medicine

## 2018-04-25 DIAGNOSIS — I10 Essential (primary) hypertension: Secondary | ICD-10-CM

## 2018-04-25 MED FILL — PANTOPRAZOLE SOD DR 40 MG T: 40 | 30 days supply | Qty: 30 | Fill #1

## 2018-04-25 MED FILL — DULoxetine HCL 20 MG CPEP: 20 | 30 days supply | Qty: 30 | Fill #2

## 2018-04-27 MED FILL — AMLODIPINE BESYLATE 10 MG T: 10 | 30 days supply | Qty: 30 | Fill #0

## 2018-05-01 ENCOUNTER — Encounter: Payer: Medicaid Other | Admitting: Podiatry

## 2018-05-02 ENCOUNTER — Other Ambulatory Visit: Payer: Self-pay | Admitting: Podiatry

## 2018-05-03 ENCOUNTER — Ambulatory Visit: Payer: Self-pay

## 2018-05-08 MED FILL — GABAPENTIN 800 MG TABLET: 800 | 30 days supply | Qty: 135 | Fill #5

## 2018-05-12 ENCOUNTER — Other Ambulatory Visit: Payer: Self-pay | Admitting: Podiatry

## 2018-05-12 ENCOUNTER — Encounter: Payer: Self-pay | Admitting: Podiatry

## 2018-05-12 DIAGNOSIS — M2022 Hallux rigidus, left foot: Secondary | ICD-10-CM | POA: Diagnosis not present

## 2018-05-12 DIAGNOSIS — M722 Plantar fascial fibromatosis: Secondary | ICD-10-CM | POA: Diagnosis not present

## 2018-05-12 DIAGNOSIS — M2021 Hallux rigidus, right foot: Secondary | ICD-10-CM | POA: Diagnosis not present

## 2018-05-12 DIAGNOSIS — M205X1 Other deformities of toe(s) (acquired), right foot: Secondary | ICD-10-CM | POA: Diagnosis not present

## 2018-05-12 DIAGNOSIS — I10 Essential (primary) hypertension: Secondary | ICD-10-CM | POA: Diagnosis not present

## 2018-05-12 DIAGNOSIS — M205X2 Other deformities of toe(s) (acquired), left foot: Secondary | ICD-10-CM | POA: Diagnosis not present

## 2018-05-12 MED ORDER — OXYCODONE HCL 5 MG PO TABS: 5.0000 mg | ORAL_TABLET | Freq: Four times a day (QID) | ORAL | 0 refills | Status: DC | PRN

## 2018-05-12 MED ORDER — IBUPROFEN 800 MG PO TABS: 800.0000 mg | ORAL_TABLET | Freq: Three times a day (TID) | ORAL | 0 refills | Status: DC | PRN

## 2018-05-12 NOTE — Progress Notes (Signed)
Post op pain 

## 2018-05-12 NOTE — Progress Notes (Signed)
Preoperative evaluation:   After reviewing the patient preoperatively, I feel that the patient would benefit greatly from bilateral joint arthroplasty/joint destructive procedure versus bunionectomy/joint salvage procedure. Radiographs are consistent with extensive degenerative changes to the 1st MTPJ bilaterally and patient would likely continue to have joint pain with any joint salvage procedure. I explained the procedure in detail with patient and all questions answered. Consent modified and both myself and patient initialed.  Felecia ShellingBrent M. Evans, DPM [facility]  Dr. Felecia ShellingBrent M. Evans, DPM    [address]                                    [place], [place] [address]                Office [phone]  Fax [phone]

## 2018-05-15 ENCOUNTER — Encounter: Payer: Medicaid Other | Admitting: Podiatry

## 2018-05-19 ENCOUNTER — Ambulatory Visit (INDEPENDENT_AMBULATORY_CARE_PROVIDER_SITE_OTHER): Payer: Medicaid Other | Admitting: Podiatry

## 2018-05-19 ENCOUNTER — Ambulatory Visit (INDEPENDENT_AMBULATORY_CARE_PROVIDER_SITE_OTHER): Payer: Medicaid Other

## 2018-05-19 ENCOUNTER — Telehealth: Payer: Self-pay | Admitting: *Deleted

## 2018-05-19 VITALS — BP 120/66 | HR 57 | Temp 98.3°F | Resp 16

## 2018-05-19 DIAGNOSIS — M2011 Hallux valgus (acquired), right foot: Secondary | ICD-10-CM | POA: Diagnosis not present

## 2018-05-19 DIAGNOSIS — M722 Plantar fascial fibromatosis: Secondary | ICD-10-CM

## 2018-05-19 DIAGNOSIS — M2012 Hallux valgus (acquired), left foot: Secondary | ICD-10-CM

## 2018-05-19 DIAGNOSIS — Z9889 Other specified postprocedural states: Secondary | ICD-10-CM

## 2018-05-19 MED ORDER — OXYCODONE HCL 5 MG PO TABS: 5.0000 mg | ORAL_TABLET | Freq: Four times a day (QID) | ORAL | 0 refills | Status: DC | PRN

## 2018-05-19 NOTE — Telephone Encounter (Signed)
Rachel Hudson - [facility] states they do not fill narcotic medication and pt would [facility] the [facility] to [facility] [address].

## 2018-05-19 NOTE — Telephone Encounter (Signed)
[facility] is closed until 2:00pm.

## 2018-05-19 NOTE — Telephone Encounter (Signed)
Note is done.  

## 2018-05-19 NOTE — Telephone Encounter (Signed)
I'm calling to get a prior authorization on my oxycodone. I haven't had any since last Friday. I told the pt it can take up to 7 days for the prior authorization and she could pay out of pocket to get some medicine. Pt stated she was told by the pharmacy that she could not pay out of pocket due to the oxy epidemic. States pharmacy is [facility] on [address].

## 2018-05-19 NOTE — Progress Notes (Signed)
Subjective: Rachel CottaFrances M Hudson is a 57 y.o. is seen today in office s/p bilateral EPF and bunionectomy preformed on 05/12/2018.  She states that she is to have a refill of pain medication.  She states it was try to center before but he required her station.  She is on the surgical service.  Overall she feels well and she denies any systemic complaints such as fevers, chills, nausea, vomiting. No calf pain, chest pain, shortness of breath.   Objective: General: No acute distress, AAOx3  DP/PT pulses palpable 2/4, CRT < 3 sec to all digits.  Protective sensation intact. Motor function intact.  Bilateral feet: Incision is well coapted without any evidence of dehiscence staples intact. There is no surrounding erythema, ascending cellulitis, fluctuance, crepitus, malodor, drainage/purulence. There is mild to moderate edema around the surgical site. There is moderate pain along the surgical site.  No other areas of tenderness to bilateral lower extremities.  No other open lesions or pre-ulcerative lesions.  No pain with calf compression, swelling, warmth, erythema.   Assessment and Plan:  Status post bilateral foot surgery , doing well with no complications   -Treatment options discussed including all alternatives, risks, and complications -X-rays were obtained and reviewed.  Status post Lorenz CoasterKeller bunionectomy with implant bilaterally.  No evidence of acute fracture. -Antibiotic ointment and a bandage was applied.  Keep the dressing clean, dry, intact. -Ice/elevation -Pain medication as needed. Ordered oxycodone for postop use. Avoid tylenol use due to liver issues.  -Monitor for any clinical signs or symptoms of infection and DVT/PE and directed to call the office immediately should any occur or go to the ER. -Follow-up as scheduled with Dr. Logan BoresEvans as scheduled or sooner if any problems arise. In the meantime, encouraged to call the office with any questions, concerns, change in symptoms.   Ovid CurdMatthew  Thien Berka, DPM

## 2018-05-19 NOTE — Telephone Encounter (Signed)
I spoke with [facility] pharmacist and she states pt has to get the Oxycodone without Acetaminophen prior authorize, and since she has spoken to me and the Pre-cert is being sent, pt can get as a self-pay.

## 2018-05-19 NOTE — Telephone Encounter (Signed)
Faxed [facility] Request for Prior Approval Short-Acting Opiod Analgesic form and clinicals to [facility].

## 2018-05-22 ENCOUNTER — Encounter: Payer: Medicaid Other | Admitting: Podiatry

## 2018-05-25 ENCOUNTER — Other Ambulatory Visit: Payer: Self-pay | Admitting: Internal Medicine

## 2018-05-25 DIAGNOSIS — G6289 Other specified polyneuropathies: Secondary | ICD-10-CM

## 2018-05-25 MED FILL — AMLODIPINE BESYLATE 10 MG T: 10 | 30 days supply | Qty: 30 | Fill #1

## 2018-05-25 MED FILL — PANTOPRAZOLE SOD DR 40 MG T: 40 | 30 days supply | Qty: 30 | Fill #2

## 2018-05-29 ENCOUNTER — Ambulatory Visit (INDEPENDENT_AMBULATORY_CARE_PROVIDER_SITE_OTHER): Payer: Medicaid Other | Admitting: Podiatry

## 2018-05-29 DIAGNOSIS — Z9889 Other specified postprocedural states: Secondary | ICD-10-CM

## 2018-05-29 DIAGNOSIS — M21612 Bunion of left foot: Secondary | ICD-10-CM

## 2018-05-29 DIAGNOSIS — M722 Plantar fascial fibromatosis: Secondary | ICD-10-CM

## 2018-05-29 DIAGNOSIS — M21611 Bunion of right foot: Secondary | ICD-10-CM

## 2018-05-31 NOTE — Progress Notes (Signed)
   Subjective:  Patient presents today status post bilateral arthroplasty with implant and EPF. DOS: 05/12/18. She reports severe pain in both feet. She has been using the post op shoes as directed and denies any modifying factors. Patient is here for further evaluation and treatment.    Past Medical History:  Diagnosis Date  . Anxiety   . Arthritis   . Cirrhosis of liver [facility]) September 2015   Stage 4  . GERD (gastroesophageal reflux disease)   . Gout   . Hep C w/o coma, chronic (HCC) As of 10/22/13  . Hypertension       Objective/Physical Exam Neurovascular status intact.  Skin incisions appear to be well coapted with sutures and staples intact. No sign of infectious process noted. No dehiscence. No active bleeding noted. Moderate edema noted to the surgical extremity.  Assessment: 1. s/p bilateral arthroplasty with implant and EPF. DOS: 05/12/18   Plan of Care:  1. Patient was evaluated.  2. Sutures removed. Dry sterile dressing applied.  3. Continue weightbearing in post op shoes.  4. Return to clinic in 2 weeks.    Felecia Shelling, DPM [facility]  Dr. Felecia Shelling, DPM    [address]                                        [place], [place] [address]                Office [phone]  Fax [phone]

## 2018-06-12 ENCOUNTER — Ambulatory Visit: Payer: Medicaid Other

## 2018-06-12 ENCOUNTER — Encounter: Payer: Self-pay | Admitting: Podiatry

## 2018-06-12 ENCOUNTER — Ambulatory Visit (INDEPENDENT_AMBULATORY_CARE_PROVIDER_SITE_OTHER): Payer: Medicaid Other | Admitting: Podiatry

## 2018-06-12 DIAGNOSIS — M21612 Bunion of left foot: Secondary | ICD-10-CM

## 2018-06-12 DIAGNOSIS — M722 Plantar fascial fibromatosis: Secondary | ICD-10-CM

## 2018-06-12 DIAGNOSIS — Z9889 Other specified postprocedural states: Secondary | ICD-10-CM

## 2018-06-12 DIAGNOSIS — M21611 Bunion of right foot: Secondary | ICD-10-CM

## 2018-06-16 ENCOUNTER — Other Ambulatory Visit: Payer: Self-pay | Admitting: Internal Medicine

## 2018-06-16 DIAGNOSIS — M47816 Spondylosis without myelopathy or radiculopathy, lumbar region: Secondary | ICD-10-CM

## 2018-06-16 DIAGNOSIS — G5603 Carpal tunnel syndrome, bilateral upper limbs: Secondary | ICD-10-CM

## 2018-06-19 MED FILL — GABAPENTIN 800 MG TABLET: 800 | 30 days supply | Qty: 135 | Fill #0

## 2018-06-19 NOTE — Progress Notes (Signed)
   Subjective:  Patient presents today status post bilateral arthroplasty with implant and EPF. DOS: 05/12/18. She states she is doing well. She denies any significant pain or modifying factors. She has been using the post op shoe as directed. Patient is here for further evaluation and treatment.    Past Medical History:  Diagnosis Date  . Anxiety   . Arthritis   . Cirrhosis of liver [facility]) September 2015   Stage 4  . GERD (gastroesophageal reflux disease)   . Gout   . Hep C w/o coma, chronic (HCC) As of 10/22/13  . Hypertension       Objective/Physical Exam Neurovascular status intact.  Skin incisions appear to be well coapted. No sign of infectious process noted. No dehiscence. No active bleeding noted. Moderate edema noted to the surgical extremity.  Assessment: 1. s/p bilateral arthroplasty with implant and EPF. DOS: 05/12/18   Plan of Care:  1. Patient was evaluated.  2. Transition out of post op shoe into good sneakers.  3. Compression anklet dispensed.  4. Return to clinic in 6 weeks.     Felecia Shelling, DPM [facility]  Dr. Felecia Shelling, DPM    [address]                                        [place], [place] [address]                Office [phone]  Fax [phone]

## 2018-06-20 MED FILL — AMLODIPINE BESYLATE 10 MG T: 10 | 30 days supply | Qty: 30 | Fill #2

## 2018-06-20 MED FILL — PANTOPRAZOLE SOD DR 40 MG T: 40 | 30 days supply | Qty: 30 | Fill #3

## 2018-06-22 ENCOUNTER — Telehealth: Payer: Self-pay | Admitting: Internal Medicine

## 2018-06-22 NOTE — Telephone Encounter (Signed)
Patient dropped off handicap placard application. Application will be put in PCP box.

## 2018-06-26 NOTE — Telephone Encounter (Signed)
Will contact pt once I receive from pcp

## 2018-06-27 ENCOUNTER — Telehealth: Payer: Self-pay | Admitting: Podiatry

## 2018-06-27 NOTE — Telephone Encounter (Signed)
I need Dr. Logan Bores to prescribe me more pain medicine. He said I can walk my dogs and my feet hurt so bad after I walk them.

## 2018-06-28 ENCOUNTER — Telehealth: Payer: Self-pay | Admitting: Podiatry

## 2018-06-28 NOTE — Telephone Encounter (Signed)
I need to get some more pain medicine. Dr. Logan BoresEvans stated it was okay to walk my dogs but my feet are tender on the bottom. Can you please call me and let me know what is going on.

## 2018-06-30 ENCOUNTER — Other Ambulatory Visit: Payer: Self-pay | Admitting: Podiatry

## 2018-06-30 MED ORDER — OXYCODONE HCL 5 MG PO TABS: 5.0000 mg | ORAL_TABLET | Freq: Four times a day (QID) | ORAL | 0 refills | Status: DC | PRN

## 2018-06-30 NOTE — Progress Notes (Signed)
Post op pain 

## 2018-06-30 NOTE — Progress Notes (Signed)
Pharmacy change

## 2018-07-19 ENCOUNTER — Other Ambulatory Visit: Payer: Self-pay | Admitting: Internal Medicine

## 2018-07-19 DIAGNOSIS — I10 Essential (primary) hypertension: Secondary | ICD-10-CM

## 2018-07-19 MED FILL — GABAPENTIN 800 MG TABLET: 800 | 30 days supply | Qty: 135 | Fill #1

## 2018-07-19 MED FILL — PANTOPRAZOLE SOD DR 40 MG T: 40 | 30 days supply | Qty: 30 | Fill #4

## 2018-07-21 MED FILL — AMLODIPINE BESYLATE 10 MG T: 10 | 30 days supply | Qty: 30 | Fill #0

## 2018-07-24 ENCOUNTER — Other Ambulatory Visit (HOSPITAL_COMMUNITY)
Admission: RE | Admit: 2018-07-24 | Discharge: 2018-07-24 | Disposition: A | Discharge status: Home / Self Care | Payer: Medicaid Other | Source: Ambulatory Visit | Attending: Internal Medicine | Admitting: Internal Medicine

## 2018-07-24 ENCOUNTER — Ambulatory Visit: Payer: Medicaid Other | Attending: Internal Medicine | Admitting: Internal Medicine

## 2018-07-24 ENCOUNTER — Encounter: Payer: Self-pay | Admitting: Internal Medicine

## 2018-07-24 ENCOUNTER — Ambulatory Visit (INDEPENDENT_AMBULATORY_CARE_PROVIDER_SITE_OTHER): Payer: Medicaid Other

## 2018-07-24 ENCOUNTER — Ambulatory Visit (INDEPENDENT_AMBULATORY_CARE_PROVIDER_SITE_OTHER): Payer: Medicaid Other | Admitting: Podiatry

## 2018-07-24 ENCOUNTER — Other Ambulatory Visit: Payer: Self-pay | Admitting: Podiatry

## 2018-07-24 ENCOUNTER — Encounter: Payer: Self-pay | Admitting: Podiatry

## 2018-07-24 VITALS — BP 108/67 | HR 63 | Temp 98.2°F | Resp 16 | Wt 167.0 lb

## 2018-07-24 DIAGNOSIS — G629 Polyneuropathy, unspecified: Secondary | ICD-10-CM | POA: Insufficient documentation

## 2018-07-24 DIAGNOSIS — M19042 Primary osteoarthritis, left hand: Secondary | ICD-10-CM | POA: Diagnosis not present

## 2018-07-24 DIAGNOSIS — K7460 Unspecified cirrhosis of liver: Secondary | ICD-10-CM | POA: Diagnosis not present

## 2018-07-24 DIAGNOSIS — M79671 Pain in right foot: Secondary | ICD-10-CM

## 2018-07-24 DIAGNOSIS — Z9889 Other specified postprocedural states: Secondary | ICD-10-CM

## 2018-07-24 DIAGNOSIS — Z7901 Long term (current) use of anticoagulants: Secondary | ICD-10-CM | POA: Diagnosis not present

## 2018-07-24 DIAGNOSIS — F319 Bipolar disorder, unspecified: Secondary | ICD-10-CM | POA: Insufficient documentation

## 2018-07-24 DIAGNOSIS — F3111 Bipolar disorder, current episode manic without psychotic features, mild: Secondary | ICD-10-CM

## 2018-07-24 DIAGNOSIS — L304 Erythema intertrigo: Secondary | ICD-10-CM

## 2018-07-24 DIAGNOSIS — M19041 Primary osteoarthritis, right hand: Secondary | ICD-10-CM | POA: Insufficient documentation

## 2018-07-24 DIAGNOSIS — M722 Plantar fascial fibromatosis: Secondary | ICD-10-CM | POA: Diagnosis not present

## 2018-07-24 DIAGNOSIS — Z79899 Other long term (current) drug therapy: Secondary | ICD-10-CM | POA: Insufficient documentation

## 2018-07-24 DIAGNOSIS — G8929 Other chronic pain: Secondary | ICD-10-CM | POA: Insufficient documentation

## 2018-07-24 DIAGNOSIS — Z1331 Encounter for screening for depression: Secondary | ICD-10-CM | POA: Diagnosis not present

## 2018-07-24 DIAGNOSIS — I10 Essential (primary) hypertension: Secondary | ICD-10-CM | POA: Insufficient documentation

## 2018-07-24 DIAGNOSIS — M79672 Pain in left foot: Principal | ICD-10-CM

## 2018-07-24 DIAGNOSIS — Z113 Encounter for screening for infections with a predominantly sexual mode of transmission: Secondary | ICD-10-CM | POA: Insufficient documentation

## 2018-07-24 DIAGNOSIS — M5416 Radiculopathy, lumbar region: Secondary | ICD-10-CM

## 2018-07-24 DIAGNOSIS — K219 Gastro-esophageal reflux disease without esophagitis: Secondary | ICD-10-CM | POA: Diagnosis not present

## 2018-07-24 MED ORDER — CELECOXIB 200 MG PO CAPS: 200.0000 mg | ORAL_CAPSULE | Freq: Every day | ORAL | 2 refills | Status: DC

## 2018-07-24 MED ORDER — NYSTATIN 100000 UNIT/GM EX CREA: TOPICAL_CREAM | CUTANEOUS | 2 refills | Status: DC

## 2018-07-24 MED ORDER — TRIAMCINOLONE ACETONIDE 0.1 % EX OINT: 1.0000 "application " | TOPICAL_OINTMENT | Freq: Two times a day (BID) | CUTANEOUS | 2 refills | Status: DC

## 2018-07-24 NOTE — Patient Instructions (Signed)
Start taking Celebrex 200 mg once a day for your arthritis symptoms.  Take your stomach medication Protonix daily while on Celebrex.  We have ordered blood test to evaluated for rheumatoid arthritis also.    Please go to the radiology department at [facility] to have the x-rays of your right hand done.

## 2018-07-24 NOTE — Progress Notes (Signed)
Patient ID: NAVI EWTON, female    DOB: February 19, 1961  MRN: [id number]  CC: std check   Subjective: Rachel Hudson is a 58 y.o. female who presents for chronic ds management.  Her concerns today include:  Patient with history of HTN, hepatitis C(treated)with cirrhosis followed by GI, GERD and chronic lower back pain with hx surgery for spinal fixation of L4-L5.  Pt requesting STD check.  Had unprotected sex with new partner 1 mth ago.  Patient states that it was a one-time thing as her usual partner of 30 years was out of town. No vaginal dischg or itching.   "I need something for my arthritis in my hands." Fingers are becoming deformed.  Using CBD oil topically. -endorses stiffness and pain that is worse in the mornings and with use of hands.  Problems gripping and opening objects.  "It's really hard to wash my body." She has pain and swelling in the knuckles of the right hand particularly the second and third MCP joints.  Had x-rays of the left hand done back in November through the ER.  This revealed osteoarthritis changes and inflammatory arthritis changes involving the MCP and PIP joints of the second through the fifth fingers and also the thumb. Better relieff with Ibuprofen than Mobic.  Requesting prescription for ibuprofen 800 mg.  Request RF on Triamcinolone ointment.  Uses for intermittent itchy rash that she gets every yr around this time of yr.  Patient with positive depression screen today.  She reports that this is more reactive to the problems that she has been experiencing with her hands.  She denies any suicidal ideation.  She has history of bipolar affective disorder.  She was seeing a psychiatrist in the past but does not recall the name.  She does not feel that she needs to see anyone at this time stating that she is been doing much better because "God has been taking care of that."  She does not wish to be on any medication.  HTN: Compliant with amlodipine and  salt restrictions.  Since last visit, she has had BL arthroplasty implant and EDF by podiatrist Dr. Logan Bores.    Patient Active Problem List   Diagnosis Date Noted  . Bipolar affective disorder, currently manic, mild (HCC) 09/12/2017  . Dysphagia 08/24/2017  . Neuropathy of both feet 04/05/2017  . Status post lumbar spinal fusion 10/22/2016  . GERD (gastroesophageal reflux disease) 08/09/2016  . Spondylolisthesis of lumbar region 07/29/2016  . Sinus bradycardia 06/15/2016  . Gingivitis 06/15/2016  . ASCUS favor benign 11/13/2015  . History of hepatitis C 10/13/2015  . Bilateral carpal tunnel syndrome 09/16/2015  . Cervical spondylosis without myelopathy 09/16/2015  . Primary osteoarthritis of both hands 09/16/2015  . Diverticulosis of colon without hemorrhage   . Peptic ulcer disease   . 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 Lumbago 11/18/2014  . Carpal tunnel syndrome 11/18/2014  . Hepatic cirrhosis (HCC) 08/06/2014  . HTN (hypertension) 02/28/2014     Current Outpatient Medications on File Prior to Visit  Medication Sig Dispense Refill  . amLODipine (NORVASC) 10 MG tablet TAKE 1 TABLET (10 MG TOTAL) BY MOUTH DAILY. MUST MAKE APPT FOR FURTHER REFILLS 30 tablet 0  . gabapentin (NEURONTIN) 800 MG tablet TAKE 1 & 1/2 TABLETS BY MOUTH 3 TIMES DAILY 135 tablet 3  . Lecith-Inosi-Chol-B12-Liver (LIVERITE) 1.125 MCG TABS Take 1 tablet by mouth 2 (two) times daily.     . milk thistle  175 MG tablet Take 175 mg by mouth daily.    . Multiple Vitamin (MULTIVITAMIN WITH MINERALS) TABS tablet Take 1 tablet by mouth daily.    . Omega-3 Fatty Acids (FISH OIL) 1000 MG CAPS Take 1,000 mg by mouth daily.    Marland Kitchen oxyCODONE (ROXICODONE) 5 MG immediate release tablet Take 1 tablet (5 mg total) by mouth every 6 (six) hours as needed for severe pain. 20 tablet 0  . pantoprazole (PROTONIX) 20 MG tablet TAKE 1 TABLET BY MOUTH DAILY  5  . pantoprazole (PROTONIX) 40 MG  tablet Take 1 tablet (40 mg total) by mouth daily. 90 tablet 3  . Tetrahydrozoline HCl (VISINE OP) Place 1 drop into both eyes daily as needed (allergies).     No current facility-administered medications on file prior to visit.     Allergies  Allergen Reactions  . Penicillins     Facial swelling Has patient had a PCN reaction causing immediate rash, facial/tongue/throat swelling, SOB or lightheadedness with hypotension: Yes Has patient had a PCN reaction causing severe rash involving mucus membranes or skin necrosis: No Has patient had a PCN reaction that required hospitalization No Has patient had a PCN reaction occurring within the last 10 years: Yes If all of the above answers are "NO", then may proceed with Cephalosporin use.   . Tylenol [Acetaminophen] Other (See Comments)    HX of Hep. C    Social History   Socioeconomic History  . Marital status: Single    Spouse name: Not on file  . Number of children: Not on file  . Years of education: Not on file  . Highest education level: Not on file  Occupational History  . Not on file  Social Needs  . Financial resource strain: Not on file  . Food insecurity:    Worry: Not on file    Inability: Not on file  . Transportation needs:    Medical: Not on file    Non-medical: Not on file  Tobacco Use  . Smoking status: Never Smoker  . Smokeless tobacco: Never Used  Substance and Sexual Activity  . Alcohol use: Not Currently    Alcohol/week: 0.0 standard drinks    Comment: occasionally  . Drug use: Yes    Frequency: 7.0 times per week    Types: Marijuana    Comment: daily for arthritis  . Sexual activity: Not Currently    Birth control/protection: None  Lifestyle  . Physical activity:    Days per week: Not on file    Minutes per session: Not on file  . Stress: Not on file  Relationships  . Social connections:    Talks on phone: Not on file    Gets together: Not on file    Attends religious service: Not on file     Active member of club or organization: Not on file    Attends meetings of clubs or organizations: Not on file    Relationship status: Not on file  . Intimate partner violence:    Fear of current or ex partner: Not on file    Emotionally abused: Not on file    Physically abused: Not on file    Forced sexual activity: Not on file  Other Topics Concern  . Not on file  Social History Narrative  . Not on file    Family History  Adopted: Yes  Problem Relation Age of Onset  . Colon cancer Neg Hx     Past Surgical History:  Procedure Laterality Date  . BACK SURGERY     lumbar fusion  . BIOPSY  09/12/2017   Procedure: BIOPSY;  Surgeon: Corbin Ade, MD;  Location: [facility];  Service: Endoscopy;;  gastric  . CARPAL TUNNEL RELEASE Right 11/13/2015   Procedure: RIGHT CARPAL TUNNEL RELEASE;  Surgeon: Betha Loa, MD;  Location: [facility];  Service: Orthopedics;  Laterality: Right;  . CARPAL TUNNEL RELEASE Left 01/29/2016   Procedure: left CARPAL TUNNEL RELEASE;  Surgeon: Betha Loa, MD;  Location: [facility];  Service: Orthopedics;  Laterality: Left;  . COLONOSCOPY N/A 07/08/2015   Procedure: COLONOSCOPY;  Surgeon: Corbin Ade, MD;  Location: [facility];  Service: Endoscopy;  Laterality: N/A;  230  . ESOPHAGOGASTRODUODENOSCOPY N/A 07/08/2015   Procedure: ESOPHAGOGASTRODUODENOSCOPY (EGD);  Surgeon: Corbin Ade, MD;  Location: [facility];  Service: Endoscopy;  Laterality: N/A;  . ESOPHAGOGASTRODUODENOSCOPY (EGD) WITH PROPOFOL N/A 09/12/2017   Procedure: ESOPHAGOGASTRODUODENOSCOPY (EGD) WITH PROPOFOL;  Surgeon: Corbin Ade, MD;  Location: [facility];  Service: Endoscopy;  Laterality: N/A;  9:30am  . LIGAMENT REPAIR Right 1980   Knee  . MALONEY DILATION N/A 09/12/2017   Procedure: Elease Hashimoto DILATION;  Surgeon: Corbin Ade, MD;  Location: [facility];  Service: Endoscopy;  Laterality: N/A;    ROS: Review of Systems  Gastrointestinal:        Saw GI in the fall.  She has had SVR.  Ultrasound of the liver ordered but she has not had it done as yet.  She has remained free of alcohol.   Negative except as stated above  PHYSICAL EXAM: BP 108/67   Pulse 63   Temp 98.2 F (36.8 C) (Oral)   Resp 16   Wt 167 lb (75.8 kg)   LMP 09/29/2016 (Approximate)   SpO2 96%   BMI 31.55 kg/m   Physical Exam  General appearance - alert, well appearing, and in no distress Mental status - normal mood, behavior, speech, dress, motor activity, and thought processes Neck - supple, no significant adenopathy Chest - clear to auscultation, no wheezes, rales or rhonchi, symmetric air entry Heart - normal rate, regular rhythm, normal S1, S2, no murmurs, rubs, clicks or gallops Musculoskeletal -hands: RT:  Noticeable warmth and swelling of the MCP joints of the right second and third fingers.  Joint enlargement of PIP and DIP joints.  Left hand: Mild enlargement of MCP joints of the second and third fingers.  Mild joint enlargement of PIP and DIP joints Extremities - peripheral pulses normal, no pedal edema, no clubbing or cyanosis Skin: Hypopigmented rash noted under and between both breasts . Depression screen [facility] 2/9 07/24/2018 01/02/2018 11/19/2016  Decreased Interest Down, Depressed, Hopeless 1 0 0  PHQ - 2 Score Altered sleeping 0 3 2  Tired, decreased energy Change in appetite Feeling bad or failure about yourself  0 0 0  Trouble concentrating Moving slowly or fidgety/restless 2 0 3  Suicidal thoughts 0 0 0  PHQ-9 Score Some recent data might be hidden    CMP Latest Ref Rng & Units 03/02/2018 09/06/2017 08/11/2017  Glucose 70 - 99 mg/dL 91 161(W) 76  BUN 6 - 20 mg/dL Creatinine 0.44 - 1.00 mg/dL 9.60 4.54 0.98  Sodium 135 - 145 mmol/L 140 138 138  Potassium 3.5 -  5.1 mmol/L 3.8 3.4(L) 4.4  Chloride 98 - 111 mmol/L 101 103 98  CO2 22 - 32 mmol/L 25 25 22   Calcium 8.9 - 10.3 mg/dL  9.3 9.5 44.9  Total Protein 6.5 - 8.1 g/dL 7.5 7.8 7.7  Total Bilirubin 0.3 - 1.2 mg/dL 0.6 0.4 0.3  Alkaline Phos 38 - 126 U/L 57 61 71  AST 15 - 41 U/L 24 19 29   ALT 0 - 44 U/L 18 13(L) 18   Lipid Panel     Component Value Date/Time   CHOL 211 (H) 08/11/2017 0957   TRIG 71 08/11/2017 0957   HDL 60 08/11/2017 0957   CHOLHDL 3.5 08/11/2017 0957   CHOLHDL 2.8 09/28/2013 0904   VLDL 25 09/28/2013 0904   LDLCALC 137 (H) 08/11/2017 0957    CBC    Component Value Date/Time   WBC 5.9 03/02/2018 0904   RBC 4.51 03/02/2018 0904   HGB 13.4 03/02/2018 0904   HGB 13.8 08/11/2017 0957   HCT 41.2 03/02/2018 0904   HCT 41.7 08/11/2017 0957   PLT 151 03/02/2018 0904   PLT 171 08/11/2017 0957   MCV 91.4 03/02/2018 0904   MCV 91 08/11/2017 0957   MCH 29.7 03/02/2018 0904   MCHC 32.5 03/02/2018 0904   RDW 14.4 03/02/2018 0904   RDW 14.8 08/11/2017 0957   LYMPHSABS 2.5 03/02/2018 0904   MONOABS 0.5 03/02/2018 0904   EOSABS 0.2 03/02/2018 0904   BASOSABS 0.0 03/02/2018 0904    ASSESSMENT AND PLAN: 1. Screen for sexually transmitted diseases Safe sex practices advised including use of condoms consistently and avoid having multiple partners - Cervicovaginal ancillary only - HIV antibody (with reflex)  2. Intertrigo - nystatin cream (MYCOSTATIN); Apply to rash under breast BID during flare  Dispense: 30 g; Refill: 2 - triamcinolone ointment (KENALOG) 0.1 %; Apply 1 application topically 2 (two) times daily.  Dispense: 30 g; Refill: 2  3. Cirrhosis of liver without ascites, unspecified hepatic cirrhosis type (HCC) Stable.  Followed by GI  4. Positive depression screening 5. Bipolar affective disorder, currently manic, mild (HCC) -pt declines referral to [facility].  She does not feel that she needs to be on any meds at this time  6. Essential hypertension At goal  7. Arthritis of both hands OA on imaging with likely inflammatory arthritis also.   Given hx of GERD, I recommend trail of  Celebrex instead of Ibuprofen. Pt reports max dose of Mobic did not work well for her - celecoxib (CELEBREX) 200 MG capsule; Take 1 capsule (200 mg total) by mouth daily.  Dispense: 30 capsule; Refill: 2 - DG Hand Complete Right; Future - ANA w/Reflex if Positive - Sedimentation Rate - CYCLIC CITRUL PEPTIDE ANTIBODY, IGG/IGA - Rheumatoid factor    Patient was given the opportunity to ask questions.  Patient verbalized understanding of the plan and was able to repeat key elements of the plan.   Orders Placed This Encounter  Procedures  . DG Hand Complete Right  . ANA w/Reflex if Positive  . Sedimentation Rate  . CYCLIC CITRUL PEPTIDE ANTIBODY, IGG/IGA  . Rheumatoid factor  . HIV antibody (with reflex)     Requested Prescriptions   Signed Prescriptions Disp Refills  . celecoxib (CELEBREX) 200 MG capsule 30 capsule 2    Sig: Take 1 capsule (200 mg total) by mouth daily.  Marland Kitchen nystatin cream (MYCOSTATIN) 30 g 2    Sig: Apply to rash under breast BID during flare  . triamcinolone ointment (  KENALOG) 0.1 % 30 g 2    Sig: Apply 1 application topically 2 (two) times daily.    Return in about 3 months (around 10/24/2018).  Jonah Blue, MD, FACP

## 2018-07-25 LAB — CERVICOVAGINAL ANCILLARY ONLY
Bacterial vaginitis: POSITIVE — AB
CANDIDA VAGINITIS: NEGATIVE
CHLAMYDIA, DNA PROBE: NEGATIVE
Neisseria Gonorrhea: NEGATIVE
TRICH (WINDOWPATH): POSITIVE — AB

## 2018-07-26 ENCOUNTER — Other Ambulatory Visit: Payer: Self-pay | Admitting: Internal Medicine

## 2018-07-26 DIAGNOSIS — Z23 Encounter for immunization: Secondary | ICD-10-CM | POA: Diagnosis not present

## 2018-07-26 LAB — SEDIMENTATION RATE: Sed Rate: 35 mm/hr (ref 0–40)

## 2018-07-26 LAB — CYCLIC CITRUL PEPTIDE ANTIBODY, IGG/IGA: CYCLIC CITRULLIN PEPTIDE AB: 7 U (ref 0–19)

## 2018-07-26 LAB — RHEUMATOID FACTOR: Rheumatoid fact SerPl-aCnc: <10 IU/mL (ref 0.0–13.9)

## 2018-07-26 LAB — ANA W/REFLEX IF POSITIVE: Anti Nuclear Antibody(ANA): NEGATIVE

## 2018-07-26 LAB — HIV ANTIBODY (ROUTINE TESTING W REFLEX): HIV Screen 4th Generation wRfx: NONREACTIVE

## 2018-07-26 MED ORDER — METRONIDAZOLE 500 MG PO TABS: 500.0000 mg | ORAL_TABLET | Freq: Two times a day (BID) | ORAL | 0 refills | Status: DC

## 2018-07-26 MED FILL — metroNIDAZOLE 500 MG TABS: 500 | 7 days supply | Qty: 14 | Fill #0

## 2018-07-26 NOTE — Progress Notes (Signed)
   Subjective:  Patient presents today status post bilateral arthroplasty with implant and EPF. DOS: 05/12/18. She states she is doing well. She reports some intermittent sharp pain in the great toes. Wearing regular shoes causes this pain. She has been using the compression anklet as directed. Patient is here for further evaluation and treatment.    Past Medical History:  Diagnosis Date  . Anxiety   . Arthritis   . Cirrhosis of liver [facility]) September 2015   Stage 4  . GERD (gastroesophageal reflux disease)   . Gout   . Hep C w/o coma, chronic (HCC) As of 10/22/13  . Hypertension       Objective/Physical Exam Neurovascular status intact.  Skin incisions appear to be well coapted. No sign of infectious process noted. No dehiscence. No active bleeding noted. Moderate edema noted to the surgical extremity.  Assessment: 1. s/p bilateral arthroplasty with implant and EPF. DOS: 05/12/18 2. Lumbar radiculopathy  Plan of Care:  1. Patient was evaluated.  2. May resume full activity with no restrictions.  3. Recommended good shoe gear.  4. Continue taking Gabapentin 1200 mg daily as directed by PCP.  5. Return to clinic as needed.      Felecia Shelling, DPM [facility]  Dr. Felecia Shelling, DPM    [address]                                        [place], [place] [address]                Office [phone]  Fax [phone]

## 2018-07-31 ENCOUNTER — Telehealth: Payer: Self-pay | Admitting: Internal Medicine

## 2018-07-31 NOTE — Telephone Encounter (Signed)
Patient called to get their lab results. Please follow up.  °

## 2018-08-01 NOTE — Telephone Encounter (Signed)
Follow up ° ° °Pt returning your call °

## 2018-08-01 NOTE — Telephone Encounter (Signed)
Contacted pt to go over lab results pt is aware and doesn't have any questions or concerns 

## 2018-08-01 NOTE — Telephone Encounter (Signed)
Returned pt call to go over lab results lvm asking pt to give me a call at her earliest convenience

## 2018-08-02 ENCOUNTER — Encounter: Payer: Self-pay | Admitting: Podiatry

## 2018-08-21 ENCOUNTER — Other Ambulatory Visit: Payer: Self-pay | Admitting: Internal Medicine

## 2018-08-21 DIAGNOSIS — I10 Essential (primary) hypertension: Secondary | ICD-10-CM

## 2018-08-21 MED FILL — PANTOPRAZOLE SOD DR 40 MG T: 40 | 30 days supply | Qty: 30 | Fill #5

## 2018-08-21 MED FILL — GABAPENTIN 800 MG TABLET: 800 | 30 days supply | Qty: 135 | Fill #2

## 2018-08-21 MED FILL — AMLODIPINE BESYLATE 10 MG T: 10 | 30 days supply | Qty: 30 | Fill #0

## 2018-09-20 MED FILL — PANTOPRAZOLE SOD DR 40 MG T: 40 | 90 days supply | Qty: 90 | Fill #6

## 2018-09-20 MED FILL — AMLODIPINE BESYLATE 10 MG T: 10 | 30 days supply | Qty: 30 | Fill #1

## 2018-09-20 MED FILL — GABAPENTIN 800 MG TABLET: 800 | 30 days supply | Qty: 135 | Fill #3

## 2018-10-23 MED FILL — AMLODIPINE BESYLATE 10 MG T: 10 | 30 days supply | Qty: 30 | Fill #2

## 2018-10-24 ENCOUNTER — Other Ambulatory Visit: Payer: Self-pay | Admitting: Internal Medicine

## 2018-10-24 DIAGNOSIS — M19041 Primary osteoarthritis, right hand: Secondary | ICD-10-CM

## 2018-10-26 ENCOUNTER — Ambulatory Visit: Payer: Medicaid Other | Attending: Internal Medicine | Admitting: Internal Medicine

## 2018-10-26 ENCOUNTER — Other Ambulatory Visit: Payer: Self-pay

## 2018-10-26 DIAGNOSIS — M19041 Primary osteoarthritis, right hand: Secondary | ICD-10-CM | POA: Diagnosis not present

## 2018-10-26 DIAGNOSIS — M19042 Primary osteoarthritis, left hand: Secondary | ICD-10-CM | POA: Diagnosis not present

## 2018-10-26 DIAGNOSIS — K7460 Unspecified cirrhosis of liver: Secondary | ICD-10-CM | POA: Diagnosis not present

## 2018-10-26 DIAGNOSIS — I10 Essential (primary) hypertension: Secondary | ICD-10-CM

## 2018-10-26 MED ORDER — AMLODIPINE BESYLATE 10 MG PO TABS: 10.0000 mg | ORAL_TABLET | Freq: Every day | ORAL | 2 refills | Status: DC

## 2018-10-26 MED ORDER — DULOXETINE HCL 20 MG PO CPEP: 20.0000 mg | ORAL_CAPSULE | Freq: Every day | ORAL | 3 refills | Status: DC

## 2018-10-26 NOTE — Progress Notes (Signed)
Pt states her fingers and joints are stiff

## 2018-10-26 NOTE — Progress Notes (Signed)
Virtual Visit via Telephone Note Due to current restrictions/limitations of in-office visits due to the COVID-19 pandemic, this scheduled clinical appointment was converted to a telehealth visit  I connected with Rachel Hudson on 10/26/18 at 2:06 p.m EDT by telephone and verified that I am speaking with the correct person using two identifiers. I am in my office.  The patient is at home.  Only the patient and myself participated in this encounter.  I discussed the limitations, risks, security and privacy concerns of performing an evaluation and management service by telephone and the availability of in person appointments. I also discussed with the patient that there may be a patient responsible charge related to this service. The patient expressed understanding and agreed to proceed.  History of Present Illness: Patient with history of HTN, hepatitis C(treated)with cirrhosis followed by GI, GERD and chronic lower back pain withhxsurgery for spinal fixation of L4-L5, bipolar affective ds. Patient last seen 07/2018. Purpose of todays visit is chronic dx management   Still having swelling and stiffness in jts of hands.  Did not get to have the x-ray of RT hand as ordered Celebrex helpful but wants increase dose I was concern for inflammatory arthritis on last visit.  Of note rheumatoid factor, anti-CCP, sed rate, ANA were negative when checked on last visit 07/2018.  Had x-rays of LT hand in past showing OA changes   HTN: has a BP device but not sure how to use it Limits salt in foods Compliant with Norvasc No CP/SOB/LE edema  Observations/Objective: No direct observation done as this was a telephone encounter.   Assessment and Plan: 1. Primary osteoarthritis of both hands Patient on Celebrex 200 mg.  I recommend adding low-dose of Cymbalta.  She will go to [facility] radiology to have the x-ray done on the right hand as was ordered on last visit. -If symptoms persist despite Celebrex  and Cymbalta, we will refer to rheumatology. - DULoxetine (CYMBALTA) 20 MG capsule; Take 1 capsule (20 mg total) by mouth daily.  Dispense: 30 capsule; Refill: 3  2. Essential hypertension Level of control unknown.  We will schedule an appointment for her to see a clinical pharmacist.  She will bring her blood pressure monitoring device so that he can show her how to use it. - amLODipine (NORVASC) 10 MG tablet; Take 1 tablet (10 mg total) by mouth daily. MUST MAKE APPT FOR FURTHER REFILLS  Dispense: 30 tablet; Refill: 2  3. Cirrhosis of liver without ascites, unspecified hepatic cirrhosis type (HCC) Last saw GI 02/2018.  The plan was for her to follow-up in 6 months. - Ambulatory referral to Gastroenterology   Follow Up Instructions: F/u in 3 mths   I discussed the assessment and treatment plan with the patient. The patient was provided an opportunity to ask questions and all were answered. The patient agreed with the plan and demonstrated an understanding of the instructions.   The patient was advised to call back or seek an in-person evaluation if the symptoms worsen or if the condition fails to improve as anticipated.  I provided 10 minutes of non-face-to-face time during this encounter.   Jonah Blue, MD

## 2018-10-27 ENCOUNTER — Encounter: Payer: Medicaid Other | Admitting: Pharmacist

## 2018-10-30 ENCOUNTER — Ambulatory Visit (HOSPITAL_COMMUNITY)
Admission: RE | Admit: 2018-10-30 | Discharge: 2018-10-30 | Disposition: A | Discharge status: Home / Self Care | Payer: Medicaid Other | Source: Ambulatory Visit | Attending: Internal Medicine | Admitting: Internal Medicine

## 2018-10-30 ENCOUNTER — Encounter: Payer: Self-pay | Admitting: Internal Medicine

## 2018-10-30 ENCOUNTER — Other Ambulatory Visit: Payer: Self-pay

## 2018-10-30 DIAGNOSIS — M19041 Primary osteoarthritis, right hand: Secondary | ICD-10-CM | POA: Diagnosis not present

## 2018-10-30 DIAGNOSIS — M19042 Primary osteoarthritis, left hand: Secondary | ICD-10-CM | POA: Diagnosis not present

## 2018-11-02 ENCOUNTER — Other Ambulatory Visit: Payer: Self-pay | Admitting: Internal Medicine

## 2018-11-02 ENCOUNTER — Other Ambulatory Visit: Payer: Self-pay | Admitting: Obstetrics and Gynecology

## 2018-11-02 DIAGNOSIS — Z1231 Encounter for screening mammogram for malignant neoplasm of breast: Secondary | ICD-10-CM

## 2018-11-03 ENCOUNTER — Telehealth: Payer: Self-pay

## 2018-11-03 DIAGNOSIS — M19041 Primary osteoarthritis, right hand: Secondary | ICD-10-CM

## 2018-11-03 NOTE — Telephone Encounter (Signed)
Contacted pt and made aware of referral being placed and she will receive a call pt doesn't have any questions or concerns

## 2018-11-03 NOTE — Telephone Encounter (Signed)
Contacted pt to go over xray    Pt states she thinks she need to see someone because her hand is not getting any better and the Celebrex helps alittle

## 2018-11-16 ENCOUNTER — Other Ambulatory Visit: Payer: Self-pay | Admitting: Internal Medicine

## 2018-11-16 DIAGNOSIS — G5603 Carpal tunnel syndrome, bilateral upper limbs: Secondary | ICD-10-CM

## 2018-11-16 DIAGNOSIS — M47816 Spondylosis without myelopathy or radiculopathy, lumbar region: Secondary | ICD-10-CM

## 2018-11-16 MED FILL — GABAPENTIN 800 MG TABLET: 800 | 30 days supply | Qty: 135 | Fill #0

## 2018-11-20 ENCOUNTER — Other Ambulatory Visit: Payer: Self-pay | Admitting: Internal Medicine

## 2018-11-20 DIAGNOSIS — L304 Erythema intertrigo: Secondary | ICD-10-CM

## 2018-11-20 MED FILL — TRIAMCINOLONE 0.1% OINTMENT: 0.1 | 5 days supply | Qty: 15 | Fill #0

## 2018-12-14 DIAGNOSIS — M19042 Primary osteoarthritis, left hand: Secondary | ICD-10-CM | POA: Diagnosis not present

## 2018-12-14 DIAGNOSIS — M19041 Primary osteoarthritis, right hand: Secondary | ICD-10-CM | POA: Diagnosis not present

## 2018-12-14 DIAGNOSIS — M1990 Unspecified osteoarthritis, unspecified site: Secondary | ICD-10-CM | POA: Diagnosis not present

## 2018-12-18 ENCOUNTER — Ambulatory Visit
Admission: RE | Admit: 2018-12-18 | Discharge: 2018-12-18 | Disposition: A | Discharge status: Home / Self Care | Payer: Medicaid Other | Source: Ambulatory Visit | Attending: Internal Medicine | Admitting: Internal Medicine

## 2018-12-18 ENCOUNTER — Other Ambulatory Visit: Payer: Self-pay

## 2018-12-18 DIAGNOSIS — Z1231 Encounter for screening mammogram for malignant neoplasm of breast: Secondary | ICD-10-CM

## 2018-12-19 ENCOUNTER — Telehealth: Payer: Self-pay | Admitting: *Deleted

## 2018-12-19 NOTE — Telephone Encounter (Signed)
Opened in Error.

## 2018-12-26 ENCOUNTER — Ambulatory Visit: Payer: Medicaid Other | Admitting: Nurse Practitioner

## 2018-12-27 DIAGNOSIS — Z23 Encounter for immunization: Secondary | ICD-10-CM | POA: Diagnosis not present

## 2019-01-26 ENCOUNTER — Ambulatory Visit: Payer: Medicaid Other | Admitting: Internal Medicine

## 2019-01-31 ENCOUNTER — Other Ambulatory Visit: Payer: Self-pay | Admitting: Internal Medicine

## 2019-01-31 DIAGNOSIS — M19041 Primary osteoarthritis, right hand: Secondary | ICD-10-CM

## 2019-02-06 ENCOUNTER — Telehealth: Payer: Self-pay

## 2019-02-06 ENCOUNTER — Other Ambulatory Visit: Payer: Self-pay

## 2019-02-06 ENCOUNTER — Ambulatory Visit (INDEPENDENT_AMBULATORY_CARE_PROVIDER_SITE_OTHER): Payer: Medicaid Other | Admitting: Nurse Practitioner

## 2019-02-06 ENCOUNTER — Encounter: Payer: Self-pay | Admitting: Nurse Practitioner

## 2019-02-06 VITALS — BP 105/65 | HR 52 | Temp 97.0°F | Ht 61.0 in | Wt 167.4 lb

## 2019-02-06 DIAGNOSIS — R1310 Dysphagia, unspecified: Secondary | ICD-10-CM

## 2019-02-06 DIAGNOSIS — K219 Gastro-esophageal reflux disease without esophagitis: Secondary | ICD-10-CM

## 2019-02-06 DIAGNOSIS — K7460 Unspecified cirrhosis of liver: Secondary | ICD-10-CM

## 2019-02-06 NOTE — Progress Notes (Signed)
Referring Provider: Ladell Pier, MD Primary Care Physician:  Ladell Pier, MD Primary GI:  Dr. Gala Romney  Chief Complaint  Patient presents with  . Cirrhosis    last u/s 02/2018    HPI:   Rachel Hudson is a 58 y.o. female who presents for follow-up on cirrhosis.  The patient was last seen in our office 02/24/2018 for cirrhosis, GERD, dysphagia.  History of chronic hepatitis C status post eradication.  Colonoscopy and endoscopy up-to-date.  Next due for colonoscopy in 2027.  EGD updated 2019 as outlined below.  Generally with well compensated liver disease with a meld score of 6.  History of dysphagia with difficulty swallowing secretions or beverages and possibly some solid food likely contributed to by dry mouth.  EGD completed 09/12/2017 found normal esophagus status post dilation, portal hypertensive gastropathy, erosive gastropathy status post biopsy, normal duodenum.  Surgical pathology found the biopsies to be antral mucosa with mild hyperemia and negative for H. pylori.  Recommended Protonix 40 mg daily and repeat EGD in 2 years (2021).  At her last visit GERD symptoms have started flaring about a month prior and taking Protonix 20 mg daily.  GERD symptoms are mostly belching with "air getting stuck in there".  Swallowing is started acting up in the past month mostly with solid foods.  No pill dysphagia.  No other GI complaints.   At her last visit she noted she still drinks occasionally and admitted to doing more "partying" since May with consumption of about a fifth of liquor in total over the past 5 months.  Hepatitis C status post treatment at [facility] infectious disease with documented SVR. Recommended updated labs and imaging, increase Protonix to 40 mg daily, follow-up in 6 months.  Labs completed 03/02/2018 which found well documented disease with a meld score of 8 and child Pugh class A.  RUQ U/S completed 03/02/2018 which found known cirrhosis without  suspicious masses or lesions.  Today she states she's doing ok overall. Has occasional bloating especially when active. She is very active, mows grass a lot, and other outdoor activities. She is appropriate avoiding people. Denies abdominal pain, N/V, hematochezia, melena, fever, chills, unintentional weight loss. Denies yellowing of skin or eyes, darkened urine, acute episodic confusion, tremors/shakes, generalized pruritis. Denies URI or flu-like symptoms. Denies loss of sense of taste or smell. Does have intermittent significant UGI gas and belching. Denies chest pain, dyspnea, dizziness, lightheadedness, syncope, near syncope. Denies any other upper or lower GI symptoms.  GERD significantly improved with increased PPI dose.  Has recently developed gout and is on steroids and has 3 month follow-up scheduled.  Past Medical History:  Diagnosis Date  . Anxiety   . Arthritis   . 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 GERD (gastroesophageal reflux disease)   . Gout   . Hep C w/o coma, chronic ([place]) As of 10/22/13  . Hypertension     Past Surgical History:  Procedure Laterality Date  . BACK SURGERY     lumbar fusion  . BIOPSY  09/12/2017   Procedure: BIOPSY;  Surgeon: Daneil Dolin, MD;  [facility];  Service: Endoscopy;;  gastric  . CARPAL TUNNEL RELEASE Right 11/13/2015   Procedure: RIGHT CARPAL TUNNEL RELEASE;  Surgeon: Leanora Cover, MD;  Location: [place];  Service: Orthopedics;  Laterality: Right;  . CARPAL TUNNEL RELEASE Left 01/29/2016   Procedure: left CARPAL TUNNEL RELEASE;  Surgeon: Lennette Bihari  Merlyn LotKuzma, MD;  Location: [facility];  Service: Orthopedics;  Laterality: Left;  . COLONOSCOPY N/A 07/08/2015   Procedure: COLONOSCOPY;  Surgeon: Corbin Adeobert M Rourk, MD;  [facility];  Service: Endoscopy;  Laterality: N/A;  230  . ESOPHAGOGASTRODUODENOSCOPY N/A 07/08/2015   Procedure: ESOPHAGOGASTRODUODENOSCOPY (EGD);  Surgeon: Corbin Adeobert M  Rourk, MD;  [facility];  Service: Endoscopy;  Laterality: N/A;  . ESOPHAGOGASTRODUODENOSCOPY (EGD) WITH PROPOFOL N/A 09/12/2017   Procedure: ESOPHAGOGASTRODUODENOSCOPY (EGD) WITH PROPOFOL;  Surgeon: Corbin Adeourk, Robert M, MD;  [facility];  Service: Endoscopy;  Laterality: N/A;  9:30am  . LIGAMENT REPAIR Right 1980   Knee  . MALONEY DILATION N/A 09/12/2017   Procedure: Elease HashimotoMALONEY DILATION;  Surgeon: Corbin Adeourk, Robert M, MD;  [facility];  Service: Endoscopy;  Laterality: N/A;    Current Outpatient Medications  Medication Sig Dispense Refill  . amLODipine (NORVASC) 10 MG tablet Take 1 tablet (10 mg total) by mouth daily. MUST MAKE APPT FOR FURTHER REFILLS 30 tablet 2  . celecoxib (CELEBREX) 200 MG capsule Take 1 capsule by mouth once daily 30 capsule 0  . DULoxetine (CYMBALTA) 20 MG capsule Take 1 capsule (20 mg total) by mouth daily. 30 capsule 3  . gabapentin (NEURONTIN) 800 MG tablet TAKE 1 & 1/2 TABLETS BY MOUTH 3 TIMES DAILY 135 tablet 3  . Lecith-Inosi-Chol-B12-Liver (LIVERITE) 1.125 MCG TABS Take 1 tablet by mouth 2 (two) times daily.     . milk thistle 175 MG tablet Take 175 mg by mouth daily.    . Multiple Vitamin (MULTIVITAMIN WITH MINERALS) TABS tablet Take 1 tablet by mouth daily.    . Omega-3 Fatty Acids (FISH OIL) 1000 MG CAPS Take 1,000 mg by mouth daily.    . pantoprazole (PROTONIX) 40 MG tablet Take 1 tablet (40 mg total) by mouth daily. 90 tablet 3  . predniSONE (DELTASONE) 5 MG tablet Take 1 tablet by mouth daily.    . Tetrahydrozoline HCl (VISINE OP) Place 1 drop into both eyes daily as needed (allergies).    . triamcinolone ointment (KENALOG) 0.1 % APPLY SPARINGLY 3 TIMES DAILY AS NEEDED TO ARM LESIONS. 15 g 0   No current facility-administered medications for this visit.     Allergies as of 02/06/2019 - Review Complete 02/06/2019  Allergen Reaction Noted  . Penicillins  03/28/2011  . Tylenol [acetaminophen] Other (See Comments) 07/14/2014     Family History  Adopted: Yes  Problem Relation Age of Onset  . Colon cancer Neg Hx     Social History   Socioeconomic History  . Marital status: Single    Spouse name: Not on file  . Number of children: Not on file  . Years of education: Not on file  . Highest education level: Not on file  Occupational History  . Not on file  Social Needs  . Financial resource strain: Not on file  . Food insecurity    Worry: Not on file    Inability: Not on file  . Transportation needs    Medical: Not on file    Non-medical: Not on file  Tobacco Use  . Smoking status: Never Smoker  . Smokeless tobacco: Never Used  Substance and Sexual Activity  . Alcohol use: Yes    Alcohol/week: 0.0 standard drinks    Comment: 1 drink every other day  . Drug use: Yes    Frequency: 7.0 times per week    Types: Marijuana    Comment: daily for  arthritis  . Sexual activity: Not Currently    Birth control/protection: None  Lifestyle  . Physical activity    Days per week: Not on file    Minutes per session: Not on file  . Stress: Not on file  Relationships  . Social Musician on phone: Not on file    Gets together: Not on file    Attends religious service: Not on file    Active member of club or organization: Not on file    Attends meetings of clubs or organizations: Not on file    Relationship status: Not on file  Other Topics Concern  . Not on file  Social History Narrative  . Not on file    Review of Systems: General: Negative for anorexia, weight loss, fever, chills, fatigue, weakness. ENT: Negative for hoarseness, difficulty swallowing. CV: Negative for chest pain, angina, palpitations, peripheral edema.  Respiratory: Negative for dyspnea at rest, cough, sputum, wheezing.  GI: See history of present illness. Neuro: Negative for memory loss, confusion.  Endo: Negative for unusual weight change.  Heme: Negative for bruising or bleeding. Allergy: Negative for rash or hives.    Physical Exam: BP 105/65   Pulse (!) 52   Temp (!) 97 F (36.1 C) (Oral)   Ht 5\' 1"  (1.549 m)   Wt 167 lb 6.4 oz (75.9 kg)   LMP 09/29/2016 (Approximate)   BMI 31.63 kg/m  General:   Alert and oriented. Pleasant and cooperative. Well-nourished and well-developed.  Eyes:  Without icterus, sclera clear and conjunctiva pink.  Ears:  Normal auditory acuity. Cardiovascular:  S1, S2 present without murmurs appreciated. Extremities without clubbing or edema. Respiratory:  Clear to auscultation bilaterally. No wheezes, rales, or rhonchi. No distress.  Gastrointestinal:  +BS, soft, non-tender and non-distended. No HSM noted. No guarding or rebound. No masses appreciated.  Rectal:  Deferred  Musculoskalatal:  Symmetrical without gross deformities. Skin:  Intact without significant lesions or rashes. Neurologic:  Alert and oriented x4;  grossly normal neurologically. No flapping tremors noted. Psych:  Alert and cooperative. Normal mood and affect. Heme/Lymph/Immune: No significant cervical adenopathy. No excessive bruising noted.    02/06/2019 9:31 AM   Disclaimer: This note was dictated with voice recognition software. Similar sounding words can inadvertently be transcribed and may not be corrected upon review.

## 2019-02-06 NOTE — Assessment & Plan Note (Signed)
Known history of cirrhosis due to hepatitis C.  Currently well managed and well compensated.  No overt hepatic symptoms.  She is currently due for updated labs and imaging and we will schedule this today.  Follow-up in 6 months and call with any worsening or severe symptoms.

## 2019-02-06 NOTE — Telephone Encounter (Signed)
Called and informed pt of US appt. Letter mailed. 

## 2019-02-06 NOTE — Patient Instructions (Signed)
Your health issues we discussed today were:   Cirrhosis: 1. Have your labs completed when you are able to 2. We will help schedule your ultrasound for you 3. As we discussed, avoid high risk behaviors such as IV drug use or tattoos in order to help prevent reinfection with hepatitis C 4. Call us if you have any worsening or severe symptoms such as yellowing of your skin or eyes, sudden confusion, abdominal swelling that does not go down and becomes hard/tight, worsening swelling in her ankles, black stools.  GERD (reflux/heartburn): 1. Continue taking your acid blocker twice a day 2. Call us if you have any worsening or severe symptoms  Overall I recommend:  1. Continue your other current medications 2. Return for follow-up in 6 months 3. Call us if you have any questions or concerns.   Because of recent events of COVID-19 ("Coronavirus"), follow CDC recommendations:  1. Wash your hand frequently 2. Avoid touching your face 3. Stay away from people who are sick 4. If you have symptoms such as fever, cough, shortness of breath then call your healthcare provider for further guidance 5. If you are sick, STAY AT HOME unless otherwise directed by your healthcare provider. 6. Follow directions from state and national officials regarding staying safe   At [facility] we value your feedback. You may receive a survey about your visit today. Please share your experience as we strive to create trusting relationships with our patients to provide genuine, compassionate, quality care.  We appreciate your understanding and patience as we review any laboratory studies, imaging, and other diagnostic tests that are ordered as we care for you. Our office policy is 5 business days for review of these results, and any emergent or urgent results are addressed in a timely manner for your best interest. If you do not hear from our office in 1 week, please contact us.   We also encourage the  use of MyChart, which contains your medical information for your review as well. If you are not enrolled in this feature, an access code is on this after visit summary for your convenience. Thank you for allowing [name] to be involved in your care.  It was great to see you today!  I hope you have a great Fall!!

## 2019-02-06 NOTE — Telephone Encounter (Signed)
[name] ab[place] RUQ scheduled for 02/12/19 at 9:30am at [facility], arrive at 9:15am. NPO after midnight prior to test. Tried to call pt, no answer, LMOVM for return call.  PA for [name] submitted via [employer]. Case approved. PA# [id number], valid 02/06/19-08/05/19.

## 2019-02-06 NOTE — Assessment & Plan Note (Signed)
GERD has improved with increasing her dose of Protonix to 40 mg twice daily.  Recommend she continue her current PPI and follow-up in 6 months.

## 2019-02-06 NOTE — Assessment & Plan Note (Signed)
No further complaints of dysphagia with better control of her GERD.  Continue current medications and follow-up in 6 months.

## 2019-02-11 NOTE — Progress Notes (Signed)
CC'ED TO PCP 

## 2019-02-12 ENCOUNTER — Ambulatory Visit (HOSPITAL_COMMUNITY)
Admission: RE | Admit: 2019-02-12 | Discharge: 2019-02-12 | Disposition: A | Discharge status: Home / Self Care | Payer: Medicaid Other | Source: Ambulatory Visit | Attending: Nurse Practitioner | Admitting: Nurse Practitioner

## 2019-02-12 ENCOUNTER — Other Ambulatory Visit: Payer: Self-pay

## 2019-02-12 ENCOUNTER — Ambulatory Visit: Payer: Medicaid Other

## 2019-02-12 DIAGNOSIS — K7460 Unspecified cirrhosis of liver: Secondary | ICD-10-CM | POA: Insufficient documentation

## 2019-02-28 ENCOUNTER — Other Ambulatory Visit: Payer: Self-pay | Admitting: Internal Medicine

## 2019-02-28 DIAGNOSIS — I10 Essential (primary) hypertension: Secondary | ICD-10-CM

## 2019-02-28 DIAGNOSIS — M19041 Primary osteoarthritis, right hand: Secondary | ICD-10-CM

## 2019-03-15 ENCOUNTER — Encounter: Payer: Self-pay | Admitting: Internal Medicine

## 2019-03-15 ENCOUNTER — Ambulatory Visit: Payer: Medicaid Other | Attending: Internal Medicine | Admitting: Internal Medicine

## 2019-03-15 ENCOUNTER — Other Ambulatory Visit: Payer: Self-pay

## 2019-03-15 DIAGNOSIS — M19042 Primary osteoarthritis, left hand: Secondary | ICD-10-CM | POA: Diagnosis not present

## 2019-03-15 DIAGNOSIS — M47816 Spondylosis without myelopathy or radiculopathy, lumbar region: Secondary | ICD-10-CM

## 2019-03-15 DIAGNOSIS — M19041 Primary osteoarthritis, right hand: Secondary | ICD-10-CM | POA: Diagnosis not present

## 2019-03-15 DIAGNOSIS — G5603 Carpal tunnel syndrome, bilateral upper limbs: Secondary | ICD-10-CM | POA: Diagnosis not present

## 2019-03-15 DIAGNOSIS — I10 Essential (primary) hypertension: Secondary | ICD-10-CM

## 2019-03-15 DIAGNOSIS — K7460 Unspecified cirrhosis of liver: Secondary | ICD-10-CM

## 2019-03-15 DIAGNOSIS — K0889 Other specified disorders of teeth and supporting structures: Secondary | ICD-10-CM

## 2019-03-15 MED ORDER — GABAPENTIN 800 MG PO TABS: ORAL_TABLET | ORAL | 3 refills | Status: DC

## 2019-03-15 MED ORDER — AMLODIPINE BESYLATE 10 MG PO TABS: 10.0000 mg | ORAL_TABLET | Freq: Every day | ORAL | 4 refills | Status: DC

## 2019-03-15 MED ORDER — DULOXETINE HCL 20 MG PO CPEP: 20.0000 mg | ORAL_CAPSULE | Freq: Every day | ORAL | 4 refills | Status: DC

## 2019-03-15 MED ORDER — TRAMADOL HCL 50 MG PO TABS: 50.0000 mg | ORAL_TABLET | Freq: Three times a day (TID) | ORAL | 0 refills | Status: AC | PRN

## 2019-03-15 MED ORDER — PANTOPRAZOLE SODIUM 40 MG PO TBEC: 40.0000 mg | DELAYED_RELEASE_TABLET | Freq: Every day | ORAL | 3 refills | Status: DC

## 2019-03-15 NOTE — Progress Notes (Signed)
Virtual Visit via Telephone Note Due to current restrictions/limitations of in-office visits due to the COVID-19 pandemic, this scheduled clinical appointment was converted to a telehealth visit  I connected with Rachel Hudson on 03/15/19 at 1:35 p.m by telephone and verified that I am speaking with the correct person using two identifiers. I am in my office.  The patient is at home.  Only the patient and myself participated in this encounter.  I discussed the limitations, risks, security and privacy concerns of performing an evaluation and management service by telephone and the availability of in person appointments. I also discussed with the patient that there may be a patient responsible charge related to this service. The patient expressed understanding and agreed to proceed.  History of Present Illness: Patient with history of HTN, hepatitis C(treated)with cirrhosis followed by GI, GERD and chronic lower back pain withhxsurgery for spinal fixation of L4-L5, bipolar affective ds.   HTN: checking BP a few times a mth Doing well on Norvasc Limits salt in foods.  No CP/SOB/LE edema  Arthritis hands: "I'm not happy with the hand doctor I've been seeing in [place]."  Seeing Rheumatologist  Dr. Fredderick Phenix with [facility].  Started on Prednisone for inflammatory arthritis in the hands.  Reports it helps but by the end of the day, pain increase.  Temporality taken off Celebrex and started on Colchicine.  Last seen 12/2018.  Requesting something to use as needed for pain in her tooth.  She had a tooth that had to be cut out of the gum 2 days ago.  She tells me that her dentist said he could not prescribe anything for her for pain and recommended  she  get it from her PCP.  I did check the [place] controlled substance reporting system and do not see a recent narcotic prescription in the system.  History of cirrhosis secondary to history of hep C that is cured: Saw GI nurse practitioner last  month.  Screening ultrasound was negative for any liver lesions.  Overall she reports she is doing well.  Requests refill on gabapentin for chronic back pain.  Also requests refill on acid reflux medicine. Current Outpatient Medications on File Prior to Visit  Medication Sig Dispense Refill  . amLODipine (NORVASC) 10 MG tablet Take 1 tablet (10 mg total) by mouth daily. Must keep upcoming office visit for refills 30 tablet 0  . celecoxib (CELEBREX) 200 MG capsule Take 1 capsule by mouth once daily 30 capsule 0  . DULoxetine (CYMBALTA) 20 MG capsule Take 1 capsule (20 mg total) by mouth daily. Must keep upcoming office visit for refills 30 capsule 0  . gabapentin (NEURONTIN) 800 MG tablet TAKE 1 & 1/2 TABLETS BY MOUTH 3 TIMES DAILY 135 tablet 3  . Lecith-Inosi-Chol-B12-Liver (LIVERITE) 1.125 MCG TABS Take 1 tablet by mouth 2 (two) times daily.     . milk thistle 175 MG tablet Take 175 mg by mouth daily.    . Multiple Vitamin (MULTIVITAMIN WITH MINERALS) TABS tablet Take 1 tablet by mouth daily.    . Omega-3 Fatty Acids (FISH OIL) 1000 MG CAPS Take 1,000 mg by mouth daily.    . pantoprazole (PROTONIX) 40 MG tablet Take 1 tablet (40 mg total) by mouth daily. 90 tablet 3  . predniSONE (DELTASONE) 5 MG tablet Take 1 tablet by mouth daily.    . Tetrahydrozoline HCl (VISINE OP) Place 1 drop into both eyes daily as needed (allergies).    . triamcinolone ointment (  KENALOG) 0.1 % APPLY SPARINGLY 3 TIMES DAILY AS NEEDED TO ARM LESIONS. 15 g 0   No current facility-administered medications on file prior to visit.     Observations/Objective:  No direct observation done as this was a telephone encounter Assessment and Plan: 1. Essential hypertension At goal.  Continue Norvasc - amLODipine (NORVASC) 10 MG tablet; Take 1 tablet (10 mg total) by mouth daily. Must keep upcoming office visit for refills  Dispense: 30 tablet; Refill: 4  2. Pain, dental We will give a limited supply of tramadol to use as  needed.  [place] controlled substance reporting system reviewed - traMADol (ULTRAM) 50 MG tablet; Take 1 tablet (50 mg total) by mouth every 8 (eight) hours as needed for up to 4 days.  Dispense: 12 tablet; Refill: 0  3. Primary osteoarthritis of both hands We discussed referring her to a different rheumatologist if she is not pleased with the one she is currently seeing.  However she decided that she will stick with him. - DULoxetine (CYMBALTA) 20 MG capsule; Take 1 capsule (20 mg total) by mouth daily. 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 Bilateral carpal tunnel syndrome - gabapentin (NEURONTIN) 800 MG tablet; TAKE 1 & 1/2 TABLETS BY MOUTH 3 TIMES DAILY  Dispense: 135 tablet; Refill: 3  6. Cirrhosis of liver without ascites, unspecified hepatic cirrhosis type ([place]) Followed by GI.  Recent screening ultrasound negative for any signs of HCC   Follow Up Instructions: 4 mths in person  I discussed the assessment and treatment plan with the patient. The patient was provided an opportunity to ask questions and all were answered. The patient agreed with the plan and demonstrated an understanding of the instructions.   The patient was advised to call back or seek an in-person evaluation if the symptoms worsen or if the condition fails to improve as anticipated.  I provided 17 minutes of non-face-to-face time during this encounter.   Karle Plumber, MD

## 2019-05-30 ENCOUNTER — Telehealth: Payer: Self-pay | Admitting: Internal Medicine

## 2019-05-30 DIAGNOSIS — M19041 Primary osteoarthritis, right hand: Secondary | ICD-10-CM

## 2019-05-30 DIAGNOSIS — M19042 Primary osteoarthritis, left hand: Secondary | ICD-10-CM

## 2019-05-30 MED ORDER — CELECOXIB 200 MG PO CAPS: 200.0000 mg | ORAL_CAPSULE | Freq: Every day | ORAL | 6 refills | Status: DC

## 2019-05-30 NOTE — Telephone Encounter (Signed)
Pt came in to request a refill on -celecoxib (CELEBREX) 200 MG capsule  To -[facility] - [place], [place] - [address]    *She would also like to report that she is no longer taking steroids.

## 2019-05-30 NOTE — Telephone Encounter (Signed)
RF sent on Celebrex to [facility] pharmacy.

## 2019-06-20 ENCOUNTER — Telehealth: Payer: Self-pay | Admitting: Internal Medicine

## 2019-06-20 DIAGNOSIS — G5603 Carpal tunnel syndrome, bilateral upper limbs: Secondary | ICD-10-CM

## 2019-06-20 DIAGNOSIS — M19041 Primary osteoarthritis, right hand: Secondary | ICD-10-CM

## 2019-06-20 NOTE — Telephone Encounter (Signed)
Pt is needing a referral sent to the [facility]. Pt states she is needing the to look at her hands because the Dr. Doctor she was referred to was not doing anything. Pt states she was disappointed. Pt states she is needing the referral because he hands ar cripple  Pt states she is needing something for pain. Pt states she is taking celebrex and mitigare(this medication helps). Pt states the celebex helps for the arthritis. Pt is wanting rx sent to [facility] on [address]. Pt states when she is starting to fall asleep her hands start to hurt.

## 2019-06-20 NOTE — Telephone Encounter (Signed)
Pt stated the [facility] of [place] needs pts medical record and a referral for patient

## 2019-06-27 DIAGNOSIS — M79642 Pain in left hand: Secondary | ICD-10-CM | POA: Diagnosis not present

## 2019-06-27 DIAGNOSIS — M79641 Pain in right hand: Secondary | ICD-10-CM | POA: Diagnosis not present

## 2019-06-27 DIAGNOSIS — M19041 Primary osteoarthritis, right hand: Secondary | ICD-10-CM | POA: Diagnosis not present

## 2019-06-27 DIAGNOSIS — M19042 Primary osteoarthritis, left hand: Secondary | ICD-10-CM | POA: Diagnosis not present

## 2019-06-29 DIAGNOSIS — M79641 Pain in right hand: Secondary | ICD-10-CM | POA: Diagnosis not present

## 2019-06-29 DIAGNOSIS — M19042 Primary osteoarthritis, left hand: Secondary | ICD-10-CM | POA: Diagnosis not present

## 2019-06-29 DIAGNOSIS — M25641 Stiffness of right hand, not elsewhere classified: Secondary | ICD-10-CM | POA: Diagnosis not present

## 2019-06-29 DIAGNOSIS — M19041 Primary osteoarthritis, right hand: Secondary | ICD-10-CM | POA: Diagnosis not present

## 2019-07-27 ENCOUNTER — Telehealth: Payer: Self-pay | Admitting: Internal Medicine

## 2019-07-27 NOTE — Telephone Encounter (Signed)
Please fill if appropriate.  

## 2019-07-27 NOTE — Telephone Encounter (Signed)
Patient called and requested for her gout medication to be refilled and sent to [facility] on [address]. Please follow up at your earliest convenience.   Mitigare

## 2019-07-30 NOTE — Telephone Encounter (Signed)
Contacted pt to see what the medication is called pt states the name of the medication is Mitigare 0.6 mg take 1 capsule by mouth twice a day

## 2019-07-31 MED ORDER — COLCHICINE 0.6 MG PO CAPS: 0.6000 mg | ORAL_CAPSULE | Freq: Two times a day (BID) | ORAL | 1 refills | Status: DC

## 2019-07-31 NOTE — Telephone Encounter (Signed)
Pt states the medication did help her hands the doctor didn't do anything. Pt states she went to another doctor and was informed she has to have surgery. Pt is also needing a letter to help her go to the [facility] because she is unable to pay

## 2019-08-02 ENCOUNTER — Telehealth: Payer: Self-pay | Admitting: Internal Medicine

## 2019-08-02 NOTE — Telephone Encounter (Signed)
Will forward to pcp

## 2019-08-02 NOTE — Telephone Encounter (Signed)
Patient called to check on what was holding up her getting the medication. Patient expressed that she is and has been in pain all week and needs something to help her with it. Please follow up at your earliest convenience.   Mitigare .6mg 

## 2019-08-03 NOTE — Telephone Encounter (Signed)
  called pt/ name and DOB verified/ made aware of sent medication to Pharmacy/

## 2019-08-07 ENCOUNTER — Ambulatory Visit: Payer: Medicaid Other | Admitting: Nurse Practitioner

## 2019-08-07 ENCOUNTER — Other Ambulatory Visit: Payer: Self-pay

## 2019-08-07 ENCOUNTER — Encounter: Payer: Self-pay | Admitting: Internal Medicine

## 2019-08-07 ENCOUNTER — Telehealth: Payer: Self-pay

## 2019-08-07 ENCOUNTER — Encounter: Payer: Self-pay | Admitting: Nurse Practitioner

## 2019-08-07 VITALS — BP 134/89 | HR 70 | Temp 97.1°F | Ht 61.0 in | Wt 178.8 lb

## 2019-08-07 DIAGNOSIS — K7460 Unspecified cirrhosis of liver: Secondary | ICD-10-CM

## 2019-08-07 DIAGNOSIS — Z8619 Personal history of other infectious and parasitic diseases: Secondary | ICD-10-CM | POA: Diagnosis not present

## 2019-08-07 DIAGNOSIS — R1310 Dysphagia, unspecified: Secondary | ICD-10-CM | POA: Diagnosis not present

## 2019-08-07 DIAGNOSIS — K219 Gastro-esophageal reflux disease without esophagitis: Secondary | ICD-10-CM | POA: Diagnosis not present

## 2019-08-07 MED ORDER — PANTOPRAZOLE SODIUM 40 MG PO TBEC: 40.0000 mg | DELAYED_RELEASE_TABLET | Freq: Two times a day (BID) | ORAL | 5 refills | Status: DC

## 2019-08-07 NOTE — Patient Instructions (Signed)
Your health issues we discussed today were:   GERD (reflux/heartburn) with dysphagia (swallowing difficulties): 1. I have sent a new prescription to your pharmacy to increase Protonix to 40 mg twice a day.  Take this first thing in the morning for eating and 30 minutes for your last meal the day 2. We will plan to possibly dilate your esophagus during your EGD to help with swallowing difficulties 3. Call us if you have any worsening or severe symptoms  Cirrhosis: 1. As we discussed, you should abstain from all alcohol consumption and high risk behaviors for hepatitis C (such as IV drug use) 2. Have your labs drawn when you are able to 3. We will help schedule your ultrasound for you 4. I have sent all labs and ultrasound orders to [facility] 5. You are due for an upper endoscopy to screen for swollen blood vessels because of cirrhosis 6. Further recommendations to follow your endoscopy  Overall I recommend:  1. Continue your other current medications 2. Return for follow-up in 6 months 3. Call us if you have any questions or concerns   ---------------------------------------------------------------  COVID-19 Vaccine Information can be found at: [email or web address] For questions related to vaccine distribution or appointments, please email [email or web address] or call [phone].   ---------------------------------------------------------------   At [facility] we value your feedback. You may receive a survey about your visit today. Please share your experience as we strive to create trusting relationships with our patients to provide genuine, compassionate, quality care.  We appreciate your understanding and patience as we review any laboratory studies, imaging, and other diagnostic tests that are ordered as we care for you. Our office policy is 5 business days for review of these results, and any emergent or  urgent results are addressed in a timely manner for your best interest. If you do not hear from our office in 1 week, please contact us.   We also encourage the use of MyChart, which contains your medical information for your review as well. If you are not enrolled in this feature, an access code is on this after visit summary for your convenience. Thank you for allowing [name] to be involved in your care.  It was great to see you today!  I hope you have a great day!!

## 2019-08-07 NOTE — Telephone Encounter (Signed)
PA for [name] RUQ submitted via [employer]. Case approved. PA# [id number], valid 08/07/19-02/03/20.

## 2019-08-07 NOTE — Progress Notes (Signed)
Referring Provider: Marcine Matar, MD Primary Care Physician:  Marcine Matar, MD Primary GI:  Dr. Jena Gauss  Chief Complaint  Patient presents with  . Gastroesophageal Reflux    lots of belching  . Cirrhosis    f/u. due for EGD    HPI:   Rachel Hudson is a 59 y.o. female who presents for follow-up on cirrhosis and GERD, currently due for 2-year EGD.  The patient was last seen in our office 02/06/2019 for GERD, dysphagia, cirrhosis.  History of chronic hepatitis C status post eradication.  Colonoscopy and endoscopy up-to-date next due for colonoscopy in 2027, and endoscopy in 2019.  Well compensated liver disease with historic meld score of 6.  History of dysphagia.  Most recent EGD 09/12/2017 with a normal esophagus status post dilation, portal hypertensive gastropathy, erosive gastropathy status post biopsy, normal duodenum.  Surgical pathology found the biopsies to be antral mucosa with mild hyperemia negative for H. pylori.  Recommended continue Protonix twice daily and repeat EGD in 2 years (April 2021).  At a previous visit she noted still drinking occasionally and doing more "partying" since May 2020 with consumption of about 1/5 of liquor and a total of the past 5 months.  At her last visit she noted doing well overall, occasional bloating.  She is physically active.  Denies overt hepatic or GI symptoms.  GERD significantly improved with increased PPI dose.  Recently developed gout on steroids.  Recommended update labs, update ultrasound, avoid high risk behaviors for hepatitis C.  Labs previously ordered have not been completed.  Right upper quadrant ultrasound completed 02/12/2019 which found known cirrhosis, no evidence of hepatic mass or lesion.  Today she states she's doing ok overall. She went to [facility] to have her labs and was told that they couldn't draw her labs at [facility] due to the labs indicated for [facility]. Still with GERD symptoms, having some dysphagia as well  which is about two times a day. Has a lot of belching. She is on Protonix 40 mg daily. Denies abdominal pain, N/V, hematochezia, melena, fever, chills, unintentional weight loss. Denies yellowing of skin/eyes, darkened urine, acute episodic confusion, generalized pruritis, tremors/shakes. Denies URI or flu-like symptoms. Denies loss of sense of taste or smell. She is planning to get COVID-19 vaccinated when her "eligibility group" comes up. Denies chest pain, dyspnea, dizziness, lightheadedness, syncope, near syncope. Denies any other upper or lower GI symptoms.  Past Medical History:  Diagnosis Date  . Anxiety   . Arthritis   . 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 GERD (gastroesophageal reflux disease)   . Gout   . Hep C w/o coma, chronic (HCC) As of 10/22/13  . Hypertension     Past Surgical History:  Procedure Laterality Date  . BACK SURGERY     lumbar fusion  . BIOPSY  09/12/2017   Procedure: BIOPSY;  Surgeon: Corbin Ade, MD;  [facility];  Service: Endoscopy;;  gastric  . CARPAL TUNNEL RELEASE Right 11/13/2015   Procedure: RIGHT CARPAL TUNNEL RELEASE;  Surgeon: Betha Loa, MD;  Location: [facility];  Service: Orthopedics;  Laterality: Right;  . CARPAL TUNNEL RELEASE Left 01/29/2016   Procedure: left CARPAL TUNNEL RELEASE;  Surgeon: Betha Loa, MD;  Location: [facility];  Service: Orthopedics;  Laterality: Left;  . COLONOSCOPY N/A 07/08/2015   Procedure: COLONOSCOPY;  Surgeon: Corbin Ade, MD;  [facility];  Service: Endoscopy;  Laterality: N/A;  230  . ESOPHAGOGASTRODUODENOSCOPY N/A 07/08/2015   Procedure: ESOPHAGOGASTRODUODENOSCOPY (EGD);  Surgeon: Corbin Ade, MD;  [facility];  Service: Endoscopy;  Laterality: N/A;  . ESOPHAGOGASTRODUODENOSCOPY (EGD) WITH PROPOFOL N/A 09/12/2017   Procedure: ESOPHAGOGASTRODUODENOSCOPY (EGD) WITH PROPOFOL;  Surgeon: Corbin Ade, MD;  [facility];   Service: Endoscopy;  Laterality: N/A;  9:30am  . LIGAMENT REPAIR Right 1980   Knee  . MALONEY DILATION N/A 09/12/2017   Procedure: Elease Hashimoto DILATION;  Surgeon: Corbin Ade, MD;  [facility];  Service: Endoscopy;  Laterality: N/A;    Current Outpatient Medications  Medication Sig Dispense Refill  . amLODipine (NORVASC) 10 MG tablet Take 1 tablet (10 mg total) by mouth daily. Must keep upcoming office visit for refills 30 tablet 4  . celecoxib (CELEBREX) 200 MG capsule Take 1 capsule (200 mg total) by mouth daily. 30 capsule 6  . Colchicine 0.6 MG CAPS Take 0.6 mg by mouth 2 (two) times daily. 60 capsule 1  . gabapentin (NEURONTIN) 800 MG tablet TAKE 1 & 1/2 TABLETS BY MOUTH 3 TIMES DAILY 135 tablet 3  . Lecith-Inosi-Chol-B12-Liver (LIVERITE) 1.125 MCG TABS Take 1 tablet by mouth 2 (two) times daily.     . milk thistle 175 MG tablet Take 175 mg by mouth daily.    . Multiple Vitamin (MULTIVITAMIN WITH MINERALS) TABS tablet Take 1 tablet by mouth daily.    . Omega-3 Fatty Acids (FISH OIL) 1000 MG CAPS Take 1,000 mg by mouth daily.    . pantoprazole (PROTONIX) 40 MG tablet Take 1 tablet (40 mg total) by mouth daily. 90 tablet 3  . predniSONE (DELTASONE) 5 MG tablet Take 1 tablet by mouth daily.    . Tetrahydrozoline HCl (VISINE OP) Place 1 drop into both eyes daily as needed (allergies).    . triamcinolone ointment (KENALOG) 0.1 % APPLY SPARINGLY 3 TIMES DAILY AS NEEDED TO ARM LESIONS. 15 g 0   No current facility-administered medications for this visit.    Allergies as of 08/07/2019 - Review Complete 08/07/2019  Allergen Reaction Noted  . Penicillins  03/28/2011  . Tylenol [acetaminophen] Other (See Comments) 07/14/2014    Family History  Adopted: Yes  Problem Relation Age of Onset  . Colon cancer Neg Hx     Social History   Socioeconomic History  . Marital status: Single    Spouse name: Not on file  . Number of children: Not on file  . Years of education: Not on  file  . Highest education level: Not on file  Occupational History  . Not on file  Tobacco Use  . Smoking status: Never Smoker  . Smokeless tobacco: Never Used  Substance and Sexual Activity  . Alcohol use: Yes    Alcohol/week: 0.0 standard drinks    Comment: 1 shot three times a week  . Drug use: Yes    Frequency: 7.0 times per week    Types: Marijuana    Comment: daily for arthritis  . Sexual activity: Not Currently    Birth control/protection: None  Other Topics Concern  . Not on file  Social History Narrative  . Not on file   Social Determinants of Health   Financial Resource Strain:   . Difficulty of Paying Living Expenses:   Food Insecurity:   . Worried About Programme researcher, broadcasting/film/video in the Last Year:   . [employer] of Food in the Last Year:  Transportation Needs:   . Film/video editor (Medical):   Marland Kitchen Lack of Transportation (Non-Medical):   Physical Activity:   . Days of Exercise per Week:   . Minutes of Exercise per Session:   Stress:   . Feeling of Stress :   Social Connections:   . Frequency of Communication with Friends and Family:   . Frequency of Social Gatherings with Friends and Family:   . Attends Religious Services:   . Active Member of Clubs or Organizations:   . Attends Archivist Meetings:   Marland Kitchen Marital Status:     Review of Systems: General: Negative for anorexia, weight loss, fever, chills, fatigue, weakness. ENT: Negative for hoarseness, difficulty swallowing. CV: Negative for chest pain, angina, palpitations, peripheral edema.  Respiratory: Negative for dyspnea at rest, cough, sputum, wheezing.  GI: See history of present illness. Derm: Negative for rash or itching.  Neuro: Negative for memory loss, confusion.  Endo: Negative for unusual weight change.  Heme: Negative for bruising or bleeding. Allergy: Negative for rash or hives.   Physical Exam: BP 134/89   Pulse 70   Temp (!) 97.1 F (36.2 C) (Oral)   Ht 5\' 1"  (1.549 m)    Wt 178 lb 12.8 oz (81.1 kg)   LMP 09/29/2016 (Approximate)   BMI 33.78 kg/m  General:   Alert and oriented. Pleasant and cooperative. Well-nourished and well-developed.  Eyes:  Without icterus, sclera clear and conjunctiva pink.  Ears:  Normal auditory acuity. Cardiovascular:  S1, S2 present without murmurs appreciated. Extremities without clubbing or edema. Respiratory:  Clear to auscultation bilaterally. No wheezes, rales, or rhonchi. No distress.  Gastrointestinal:  +BS, soft, non-tender and non-distended. No HSM noted. No guarding or rebound. No masses appreciated.  Rectal:  Deferred  Musculoskalatal:  Symmetrical without gross deformities. Neurologic:  Alert and oriented x4;  grossly normal neurologically. Psych:  Alert and cooperative. Normal mood and affect. Heme/Lymph/Immune: No excessive bruising noted.    08/07/2019 9:41 AM   Disclaimer: This note was dictated with voice recognition software. Similar sounding words can inadvertently be transcribed and may not be corrected upon review.

## 2019-08-07 NOTE — Assessment & Plan Note (Signed)
The patient currently complains of some recurrent twice weekly solid food dysphagia.  She is currently due for EGD for variceal surveillance.  We will plan for possible dilation.  Likely as a result of progressive GERD symptoms and reflux.  Further reflux management as per below to include increase PPI to twice daily.  Return for follow-up in 6 months.

## 2019-08-07 NOTE — Assessment & Plan Note (Signed)
History of hepatitis C status post treatment and eradication.  Continue to reinforce avoidance of high-risk behaviors that could result in reinfection.  Follow-up in 6 months.

## 2019-08-07 NOTE — Telephone Encounter (Signed)
Pre-op and COVID test 10/23/19. Letter mailed with procedure instructions. 

## 2019-08-07 NOTE — Assessment & Plan Note (Signed)
History of hepatitis C cirrhosis.  Generally well compensated disease historically.  She is due for updated labs and imaging.  She is also due for 2-year surveillance endoscopy.  No overt hepatic symptoms.  Continue to recommend avoiding high-risk behaviors, abstain from all alcohol.  Generally well compensated disease and we will continue to monitor.  Further recommendations to follow any results.  As an aside, the patient is also having solid food dysphagia as per below.  Given this we will plan for possible dilation with her EGD.  Proceed with EGD +/- dilation with Dr. Gala Romney in near future: the risks, benefits, and alternatives have been discussed with the patient in detail. The patient states understanding and desires to proceed.  The patient is currently on Neurontin.  The patient is not on any other anticoagulants, anxiolytics, chronic pain medications, antidepressants, antidiabetics, or iron supplements.  However, she does have a history of alcoholism.  Currently drinking 2 shots of liquor a week.  Given this we will plan for the procedure on propofol/MAC as previously done to promote adequate sedation.

## 2019-08-07 NOTE — Assessment & Plan Note (Signed)
GERD symptoms currently with some progressive worsening.  I will increase her Protonix to twice daily.  Follow-up in 6 months.

## 2019-08-07 NOTE — Progress Notes (Signed)
Cc'ed to pcp °

## 2019-08-07 NOTE — Telephone Encounter (Signed)
[name] abd RUQ scheduled at [facility] 08/14/19 at 10:00am, arrive at 9:45am. NPO after midnight prior to test.  Called and informed pt of [name] appt. Letter also mailed. EGD/-/+Dil w/Prop w/RMR scheduled for 10/25/19 at 10:30am. Orders entered.

## 2019-08-08 DIAGNOSIS — M79642 Pain in left hand: Secondary | ICD-10-CM | POA: Diagnosis not present

## 2019-08-08 DIAGNOSIS — M19041 Primary osteoarthritis, right hand: Secondary | ICD-10-CM | POA: Diagnosis not present

## 2019-08-08 DIAGNOSIS — M65332 Trigger finger, left middle finger: Secondary | ICD-10-CM | POA: Diagnosis not present

## 2019-08-08 DIAGNOSIS — M79641 Pain in right hand: Secondary | ICD-10-CM | POA: Diagnosis not present

## 2019-08-08 DIAGNOSIS — M19042 Primary osteoarthritis, left hand: Secondary | ICD-10-CM | POA: Diagnosis not present

## 2019-08-09 ENCOUNTER — Other Ambulatory Visit: Payer: Self-pay | Admitting: Orthopedic Surgery

## 2019-08-09 DIAGNOSIS — M19042 Primary osteoarthritis, left hand: Secondary | ICD-10-CM | POA: Diagnosis not present

## 2019-08-09 DIAGNOSIS — M25642 Stiffness of left hand, not elsewhere classified: Secondary | ICD-10-CM | POA: Diagnosis not present

## 2019-08-09 DIAGNOSIS — M19041 Primary osteoarthritis, right hand: Secondary | ICD-10-CM | POA: Diagnosis not present

## 2019-08-09 DIAGNOSIS — M79642 Pain in left hand: Secondary | ICD-10-CM | POA: Diagnosis not present

## 2019-08-14 ENCOUNTER — Other Ambulatory Visit: Payer: Self-pay

## 2019-08-14 ENCOUNTER — Ambulatory Visit (HOSPITAL_COMMUNITY)
Admission: RE | Admit: 2019-08-14 | Discharge: 2019-08-14 | Disposition: A | Discharge status: Home / Self Care | Payer: Medicaid Other | Source: Ambulatory Visit | Attending: Nurse Practitioner | Admitting: Nurse Practitioner

## 2019-08-14 DIAGNOSIS — K7460 Unspecified cirrhosis of liver: Secondary | ICD-10-CM | POA: Diagnosis not present

## 2019-08-14 DIAGNOSIS — R1310 Dysphagia, unspecified: Secondary | ICD-10-CM

## 2019-08-14 DIAGNOSIS — K219 Gastro-esophageal reflux disease without esophagitis: Secondary | ICD-10-CM

## 2019-08-14 DIAGNOSIS — Z8619 Personal history of other infectious and parasitic diseases: Secondary | ICD-10-CM

## 2019-08-14 LAB — COMPREHENSIVE METABOLIC PANEL
ALT: 21 U/L (ref 0–44)
AST: 24 U/L (ref 15–41)
Albumin: 4.2 g/dL (ref 3.5–5.0)
Alkaline Phosphatase: 66 U/L (ref 38–126)
Anion gap: 10 (ref 5–15)
BUN: 10 mg/dL (ref 6–20)
CO2: 26 mmol/L (ref 22–32)
Calcium: 9.5 mg/dL (ref 8.9–10.3)
Chloride: 104 mmol/L (ref 98–111)
Creatinine, Ser: 0.79 mg/dL (ref 0.44–1.00)
GFR calc Af Amer: >60 mL/min (ref >60)
GFR calc non Af Amer: >60 mL/min (ref >60)
Glucose, Bld: 92 mg/dL (ref 70–99)
Potassium: 4.4 mmol/L (ref 3.5–5.1)
Sodium: 140 mmol/L (ref 135–145)
Total Bilirubin: 0.6 mg/dL (ref 0.3–1.2)
Total Protein: 7.3 g/dL (ref 6.5–8.1)

## 2019-08-14 LAB — CBC
HCT: 41.6 % (ref 36.0–46.0)
Hemoglobin: 13.6 g/dL (ref 12.0–15.0)
MCH: 29.1 pg (ref 26.0–34.0)
MCHC: 32.7 g/dL (ref 30.0–36.0)
MCV: 89.1 fL (ref 80.0–100.0)
Platelets: 190 10*3/uL (ref 150–400)
RBC: 4.67 MIL/uL (ref 3.87–5.11)
RDW: 14.3 % (ref 11.5–15.5)
WBC: 7.5 10*3/uL (ref 4.0–10.5)
nRBC: 0 % (ref 0.0–0.2)

## 2019-08-14 LAB — PROTIME-INR
INR: 1.1 (ref 0.8–1.2)
Prothrombin Time: 14 seconds (ref 11.4–15.2)

## 2019-08-15 LAB — AFP TUMOR MARKER: AFP, Serum, Tumor Marker: 2.9 ng/mL (ref 0.0–8.3)

## 2019-08-17 DIAGNOSIS — M19041 Primary osteoarthritis, right hand: Secondary | ICD-10-CM | POA: Diagnosis not present

## 2019-08-17 DIAGNOSIS — M79642 Pain in left hand: Secondary | ICD-10-CM | POA: Diagnosis not present

## 2019-08-17 DIAGNOSIS — M19042 Primary osteoarthritis, left hand: Secondary | ICD-10-CM | POA: Diagnosis not present

## 2019-08-30 ENCOUNTER — Ambulatory Visit (HOSPITAL_BASED_OUTPATIENT_CLINIC_OR_DEPARTMENT_OTHER): Admit: 2019-08-30 | Payer: Medicaid Other | Admitting: Orthopedic Surgery

## 2019-08-30 ENCOUNTER — Encounter (HOSPITAL_BASED_OUTPATIENT_CLINIC_OR_DEPARTMENT_OTHER): Payer: Self-pay

## 2019-08-30 SURGERY — FINGER ARTHROSCOPY WITH CARPOMETACARPEL (CMC) ARTHROPLASTY
Anesthesia: Choice | Site: Finger | Laterality: Right

## 2019-09-24 ENCOUNTER — Other Ambulatory Visit: Payer: Self-pay | Admitting: Internal Medicine

## 2019-09-24 DIAGNOSIS — I10 Essential (primary) hypertension: Secondary | ICD-10-CM

## 2019-09-27 ENCOUNTER — Other Ambulatory Visit: Payer: Self-pay | Admitting: Internal Medicine

## 2019-09-27 DIAGNOSIS — I10 Essential (primary) hypertension: Secondary | ICD-10-CM

## 2019-09-28 ENCOUNTER — Telehealth: Payer: Self-pay | Admitting: Internal Medicine

## 2019-09-28 DIAGNOSIS — I10 Essential (primary) hypertension: Secondary | ICD-10-CM

## 2019-09-28 MED ORDER — AMLODIPINE BESYLATE 10 MG PO TABS: 10.0000 mg | ORAL_TABLET | Freq: Every day | ORAL | 0 refills | Status: DC

## 2019-09-28 NOTE — Telephone Encounter (Signed)
Rx sent 

## 2019-09-28 NOTE — Telephone Encounter (Signed)
Patient called in and requeted for listed medication to be refilled and sent to [facility] [address] [place], [place] - [address]  [address], [place] [address]  amLODipine (NORVASC) 10 MG tablet [[id number]]

## 2019-09-28 NOTE — Telephone Encounter (Signed)
Will forward to Luke  

## 2019-10-04 ENCOUNTER — Telehealth: Payer: Self-pay | Admitting: Internal Medicine

## 2019-10-04 NOTE — Telephone Encounter (Signed)
[phone] please call patient, said she can not remember anything about her procedure, date, time etc... stated she has gone through a lot since her office visit in march and cant remember

## 2019-10-04 NOTE — Telephone Encounter (Signed)
Called pt. Unable to hear her. I have mailed new instructions with her pre-op/covid test appt.

## 2019-10-05 ENCOUNTER — Other Ambulatory Visit: Payer: Self-pay | Admitting: Internal Medicine

## 2019-10-05 DIAGNOSIS — G5603 Carpal tunnel syndrome, bilateral upper limbs: Secondary | ICD-10-CM

## 2019-10-05 DIAGNOSIS — M47816 Spondylosis without myelopathy or radiculopathy, lumbar region: Secondary | ICD-10-CM

## 2019-10-10 DIAGNOSIS — Z79899 Other long term (current) drug therapy: Secondary | ICD-10-CM | POA: Diagnosis not present

## 2019-10-10 DIAGNOSIS — M130 Polyarthritis, unspecified: Secondary | ICD-10-CM | POA: Diagnosis not present

## 2019-10-10 DIAGNOSIS — G8929 Other chronic pain: Secondary | ICD-10-CM | POA: Diagnosis not present

## 2019-10-10 DIAGNOSIS — M542 Cervicalgia: Secondary | ICD-10-CM | POA: Diagnosis not present

## 2019-10-10 DIAGNOSIS — M545 Low back pain: Secondary | ICD-10-CM | POA: Diagnosis not present

## 2019-10-17 NOTE — Patient Instructions (Signed)
RISSA TURLEY  10/17/2019     @PREFPERIOPPHARMACY @   Your procedure is scheduled on  10/25/2019.  Report to 12/25/2019 at  0915  A.M.  Call this number if you have problems the morning of surgery:  [phone]   Remember:  Follow the diet instructions given to you by Dr [phone] office.                         Take these medicines the morning of surgery with A SIP OF WATER amlodipine,celebrex, gabapentin, protonix.    Do not wear jewelry, make-up or nail polish.  Do not wear lotions, powders, or perfume. Please wear deodorant and brush your teeth.  Do not shave 48 hours prior to surgery.  Men may shave face and neck.  Do not bring valuables to the hospital.  [facility] is not responsible for any belongings or valuables.  Contacts, dentures or bridgework may not be worn into surgery.  Leave your suitcase in the car.  After surgery it may be brought to your room.  For patients admitted to the hospital, discharge time will be determined by your treatment team.  Patients discharged the day of surgery will not be allowed to drive home.   Name and phone number of your driver:   family Special instructions:  DO NOT smoke the morning of your procedure.  Please read over the following fact sheets that you were given. Anesthesia Post-op Instructions and Care and Recovery After Surgery       Upper Endoscopy, Adult, Care After This sheet gives you information about how to care for yourself after your procedure. Your health care provider may also give you more specific instructions. If you have problems or questions, contact your health care provider. What can I expect after the procedure? After the procedure, it is common to have:  A sore throat.  Mild stomach pain or discomfort.  Bloating.  Nausea. Follow these instructions at home:   Follow instructions from your health care provider about what to eat or drink after your procedure.  Return to your normal  activities as told by your health care provider. Ask your health care provider what activities are safe for you.  Take over-the-counter and prescription medicines only as told by your health care provider.  Do not drive for 24 hours if you were given a sedative during your procedure.  Keep all follow-up visits as told by your health care provider. This is important. Contact a health care provider if you have:  A sore throat that lasts longer than one day.  Trouble swallowing. Get help right away if:  You vomit blood or your vomit looks like coffee grounds.  You have: ? A fever. ? Bloody, black, or tarry stools. ? A severe sore throat or you cannot swallow. ? Difficulty breathing. ? Severe pain in your chest or abdomen. Summary  After the procedure, it is common to have a sore throat, mild stomach discomfort, bloating, and nausea.  Do not drive for 24 hours if you were given a sedative during the procedure.  Follow instructions from your health care provider about what to eat or drink after your procedure.  Return to your normal activities as told by your health care provider. This information is not intended to replace advice given to you by your health care provider. Make sure you discuss any questions you have with your health care provider.  Document Revised: 11/01/2017 Document Reviewed: 10/10/2017 Elsevier Patient Education  [place].  Esophageal Dilatation Esophageal dilatation, also called esophageal dilation, is a procedure to widen or open (dilate) a blocked or narrowed part of the esophagus. The esophagus is the part of the body that moves food and liquid from the mouth to the stomach. You may need this procedure if:  You have a buildup of scar tissue in your esophagus that makes it difficult, painful, or impossible to swallow. This can be caused by gastroesophageal reflux disease (GERD).  You have cancer of the esophagus.  There is a problem with how food  moves through your esophagus. In some cases, you may need this procedure repeated at a later time to dilate the esophagus gradually. Tell a health care provider about:  Any allergies you have.  All medicines you are taking, including vitamins, herbs, eye drops, creams, and over-the-counter medicines.  Any problems you or family members have had with anesthetic medicines.  Any blood disorders you have.  Any surgeries you have had.  Any medical conditions you have.  Any antibiotic medicines you are required to take before dental procedures.  Whether you are pregnant or may be pregnant. What are the risks? Generally, this is a safe procedure. However, problems may occur, including:  Bleeding due to a tear in the lining of the esophagus.  A hole (perforation) in the esophagus. What happens before the procedure?  Follow instructions from your health care provider about eating or drinking restrictions.  Ask your health care provider about changing or stopping your regular medicines. This is especially important if you are taking diabetes medicines or blood thinners.  Plan to have someone take you home from the hospital or clinic.  Plan to have a responsible adult care for you for at least 24 hours after you leave the hospital or clinic. This is important. What happens during the procedure?  You may be given a medicine to help you relax (sedative).  A numbing medicine may be sprayed into the back of your throat, or you may gargle the medicine.  Your health care provider may perform the dilatation using various surgical instruments, such as: ? Simple dilators. This instrument is carefully placed in the esophagus to stretch it. ? Guided wire bougies. This involves using an endoscope to insert a wire into the esophagus. A dilator is passed over this wire to enlarge the esophagus. Then the wire is removed. ? Balloon dilators. An endoscope with a small balloon at the end is inserted  into the esophagus. The balloon is inflated to stretch the esophagus and open it up. The procedure may vary among health care providers and hospitals. What happens after the procedure?  Your blood pressure, heart rate, breathing rate, and blood oxygen level will be monitored until the medicines you were given have worn off.  Your throat may feel slightly sore and numb. This will improve slowly over time.  You will not be allowed to eat or drink until your throat is no longer numb.  When you are able to drink, urinate, and sit on the edge of the bed without nausea or dizziness, you may be able to return home. Follow these instructions at home:  Take over-the-counter and prescription medicines only as told by your health care provider.  Do not drive for 24 hours if you were given a sedative during your procedure.  You should have a responsible adult with you for 24 hours after the procedure.  Follow  instructions from your health care provider about any eating or drinking restrictions.  Do not use any products that contain nicotine or tobacco, such as cigarettes and e-cigarettes. If you need help quitting, ask your health care provider.  Keep all follow-up visits as told by your health care provider. This is important. Get help right away if you:  Have a fever.  Have chest pain.  Have pain that is not relieved by medication.  Have trouble breathing.  Have trouble swallowing.  Vomit blood. Summary  Esophageal dilatation, also called esophageal dilation, is a procedure to widen or open (dilate) a blocked or narrowed part of the esophagus.  Plan to have someone take you home from the hospital or clinic.  For this procedure, a numbing medicine may be sprayed into the back of your throat, or you may gargle the medicine.  Do not drive for 24 hours if you were given a sedative during your procedure. This information is not intended to replace advice given to you by your health  care provider. Make sure you discuss any questions you have with your health care provider. Document Revised: 03/07/2019 Document Reviewed: 03/15/2017 Elsevier Patient Education  2020 [place] After These instructions provide you with information about caring for yourself after your procedure. Your health care provider may also give you more specific instructions. Your treatment has been planned according to current medical practices, but problems sometimes occur. Call your health care provider if you have any problems or questions after your procedure. What can I expect after the procedure? After your procedure, you may:  Feel sleepy for several hours.  Feel clumsy and have poor balance for several hours.  Feel forgetful about what happened after the procedure.  Have poor judgment for several hours.  Feel nauseous or vomit.  Have a sore throat if you had a breathing tube during the procedure. Follow these instructions at home: For at least 24 hours after the procedure:      Have a responsible adult stay with you. It is important to have someone help care for you until you are awake and alert.  Rest as needed.  Do not: ? Participate in activities in which you could fall or become injured. ? Drive. ? Use heavy machinery. ? Drink alcohol. ? Take sleeping pills or medicines that cause drowsiness. ? Make important decisions or sign legal documents. ? Take care of children on your own. Eating and drinking  Follow the diet that is recommended by your health care provider.  If you vomit, drink water, juice, or soup when you can drink without vomiting.  Make sure you have little or no nausea before eating solid foods. General instructions  Take over-the-counter and prescription medicines only as told by your health care provider.  If you have sleep apnea, surgery and certain medicines can increase your risk for breathing problems. Follow  instructions from your health care provider about wearing your sleep device: ? Anytime you are sleeping, including during daytime naps. ? While taking prescription pain medicines, sleeping medicines, or medicines that make you drowsy.  If you smoke, do not smoke without supervision.  Keep all follow-up visits as told by your health care provider. This is important. Contact a health care provider if:  You keep feeling nauseous or you keep vomiting.  You feel light-headed.  You develop a rash.  You have a fever. Get help right away if:  You have trouble breathing. Summary  For several hours  after your procedure, you may feel sleepy and have poor judgment.  Have a responsible adult stay with you for at least 24 hours or until you are awake and alert. This information is not intended to replace advice given to you by your health care provider. Make sure you discuss any questions you have with your health care provider. Document Revised: 08/08/2017 Document Reviewed: 08/31/2015 Elsevier Patient Education  [place].

## 2019-10-23 ENCOUNTER — Encounter (HOSPITAL_COMMUNITY)
Admission: RE | Admit: 2019-10-23 | Discharge: 2019-10-23 | Disposition: A | Discharge status: Home / Self Care | Payer: Medicaid Other | Source: Ambulatory Visit | Attending: Internal Medicine | Admitting: Internal Medicine

## 2019-10-23 ENCOUNTER — Other Ambulatory Visit (HOSPITAL_COMMUNITY)
Admission: RE | Admit: 2019-10-23 | Discharge: 2019-10-23 | Disposition: A | Discharge status: Home / Self Care | Payer: Medicaid Other | Source: Ambulatory Visit | Attending: Internal Medicine | Admitting: Internal Medicine

## 2019-10-23 ENCOUNTER — Other Ambulatory Visit: Payer: Self-pay

## 2019-10-23 DIAGNOSIS — Z01812 Encounter for preprocedural laboratory examination: Secondary | ICD-10-CM | POA: Diagnosis not present

## 2019-10-23 DIAGNOSIS — Z20822 Contact with and (suspected) exposure to covid-19: Secondary | ICD-10-CM | POA: Diagnosis not present

## 2019-10-23 LAB — SARS CORONAVIRUS 2 (TAT 6-24 HRS): SARS Coronavirus 2: NEGATIVE

## 2019-10-24 ENCOUNTER — Other Ambulatory Visit: Payer: Self-pay | Admitting: Internal Medicine

## 2019-10-24 DIAGNOSIS — I10 Essential (primary) hypertension: Secondary | ICD-10-CM

## 2019-10-25 ENCOUNTER — Ambulatory Visit (HOSPITAL_COMMUNITY)
Admission: RE | Admit: 2019-10-25 | Discharge: 2019-10-25 | Disposition: A | Discharge status: Home / Self Care | Payer: Medicaid Other | Source: Ambulatory Visit | Attending: Internal Medicine | Admitting: Internal Medicine

## 2019-10-25 ENCOUNTER — Encounter (HOSPITAL_COMMUNITY): Payer: Self-pay | Admitting: Internal Medicine

## 2019-10-25 ENCOUNTER — Encounter (HOSPITAL_COMMUNITY)
Admission: RE | Disposition: A | Discharge status: Home / Self Care | Payer: Self-pay | Source: Ambulatory Visit | Attending: Internal Medicine

## 2019-10-25 ENCOUNTER — Ambulatory Visit (HOSPITAL_COMMUNITY): Payer: Medicaid Other | Admitting: Anesthesiology

## 2019-10-25 ENCOUNTER — Other Ambulatory Visit: Payer: Self-pay

## 2019-10-25 DIAGNOSIS — K766 Portal hypertension: Secondary | ICD-10-CM | POA: Insufficient documentation

## 2019-10-25 DIAGNOSIS — I10 Essential (primary) hypertension: Secondary | ICD-10-CM | POA: Diagnosis not present

## 2019-10-25 DIAGNOSIS — R1310 Dysphagia, unspecified: Secondary | ICD-10-CM

## 2019-10-25 DIAGNOSIS — B182 Chronic viral hepatitis C: Secondary | ICD-10-CM | POA: Diagnosis not present

## 2019-10-25 DIAGNOSIS — M1990 Unspecified osteoarthritis, unspecified site: Secondary | ICD-10-CM | POA: Diagnosis not present

## 2019-10-25 DIAGNOSIS — Z79899 Other long term (current) drug therapy: Secondary | ICD-10-CM | POA: Diagnosis not present

## 2019-10-25 DIAGNOSIS — K7460 Unspecified cirrhosis of liver: Secondary | ICD-10-CM | POA: Diagnosis not present

## 2019-10-25 DIAGNOSIS — F419 Anxiety disorder, unspecified: Secondary | ICD-10-CM | POA: Insufficient documentation

## 2019-10-25 DIAGNOSIS — Z880 Allergy status to penicillin: Secondary | ICD-10-CM | POA: Diagnosis not present

## 2019-10-25 DIAGNOSIS — R1314 Dysphagia, pharyngoesophageal phase: Secondary | ICD-10-CM | POA: Diagnosis not present

## 2019-10-25 DIAGNOSIS — K3189 Other diseases of stomach and duodenum: Secondary | ICD-10-CM | POA: Insufficient documentation

## 2019-10-25 DIAGNOSIS — Z886 Allergy status to analgesic agent status: Secondary | ICD-10-CM | POA: Diagnosis not present

## 2019-10-25 DIAGNOSIS — M109 Gout, unspecified: Secondary | ICD-10-CM | POA: Diagnosis not present

## 2019-10-25 DIAGNOSIS — K219 Gastro-esophageal reflux disease without esophagitis: Secondary | ICD-10-CM | POA: Diagnosis not present

## 2019-10-25 DIAGNOSIS — K2100 Gastro-esophageal reflux disease with esophagitis, without bleeding: Secondary | ICD-10-CM | POA: Diagnosis not present

## 2019-10-25 HISTORY — PX: ESOPHAGOGASTRODUODENOSCOPY (EGD) WITH PROPOFOL: SHX5813

## 2019-10-25 HISTORY — PX: MALONEY DILATION: SHX5535

## 2019-10-25 SURGERY — ESOPHAGOGASTRODUODENOSCOPY (EGD) WITH PROPOFOL
Anesthesia: General

## 2019-10-25 MED ORDER — LIDOCAINE VISCOUS HCL 2 % MT SOLN
15.0000 mL | Freq: Once | OROMUCOSAL | Status: AC
  Administered 2019-10-25: 15 mL via OROMUCOSAL

## 2019-10-25 MED ORDER — LACTATED RINGERS IV SOLN
Freq: Once | INTRAVENOUS | Status: AC
  Administered 2019-10-25: 1000 mL via INTRAVENOUS

## 2019-10-25 MED ORDER — GLYCOPYRROLATE 0.2 MG/ML IJ SOLN
INTRAMUSCULAR | Status: AC
  Filled 2019-10-25: qty 1

## 2019-10-25 MED ORDER — LIDOCAINE VISCOUS HCL 2 % MT SOLN
OROMUCOSAL | Status: AC
  Filled 2019-10-25: qty 15

## 2019-10-25 MED ORDER — CHLORHEXIDINE GLUCONATE CLOTH 2 % EX PADS: 6.0000 | MEDICATED_PAD | Freq: Once | CUTANEOUS | Status: DC

## 2019-10-25 MED ORDER — GLYCOPYRROLATE 0.2 MG/ML IJ SOLN
0.2000 mg | Freq: Once | INTRAMUSCULAR | Status: AC
  Administered 2019-10-25: 0.2 mg via INTRAVENOUS

## 2019-10-25 MED ORDER — PROPOFOL 500 MG/50ML IV EMUL
INTRAVENOUS | Status: DC | PRN
  Administered 2019-10-25: 40 mg via INTRAVENOUS
  Administered 2019-10-25: 70 mg via INTRAVENOUS
  Administered 2019-10-25: 150 mg via INTRAVENOUS
  Administered 2019-10-25: 60 mg via INTRAVENOUS
  Administered 2019-10-25: 40 mg via INTRAVENOUS

## 2019-10-25 MED ORDER — LACTATED RINGERS IV SOLN: INTRAVENOUS | Status: DC | PRN

## 2019-10-25 NOTE — H&P (Signed)
@LOGO @   Primary Care Physician:  , MD Primary Gastroenterologist:  Dr. Marcine Matar  Pre-Procedure History & Physical: HPI:  Rachel Hudson is a 59 y.o. female here for evaluation of further evaluation of esophageal dysphagia.  Also history of cirrhosis; due for variceal screening.  Underwent EGD with empiric dilation (normal esophagus) 2 years ago.  Prior dilation helped for a while.  States Protonix 40 mg once daily does not help reflux symptoms much at all.  Past Medical History:  Diagnosis Date  . Anxiety   . Arthritis   . 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 GERD (gastroesophageal reflux disease)   . Gout   . Hep C w/o coma, chronic (HCC) As of 10/22/13  . Hypertension     Past Surgical History:  Procedure Laterality Date  . BACK SURGERY     lumbar fusion  . BIOPSY  09/12/2017   Procedure: BIOPSY;  Surgeon: 09/14/2017, MD;  [facility];  Service: Endoscopy;;  gastric  . CARPAL TUNNEL RELEASE Right 11/13/2015   Procedure: RIGHT CARPAL TUNNEL RELEASE;  Surgeon: 11/15/2015, MD;  Location: [facility];  Service: Orthopedics;  Laterality: Right;  . CARPAL TUNNEL RELEASE Left 01/29/2016   Procedure: left CARPAL TUNNEL RELEASE;  Surgeon: 03/30/2016, MD;  Location: [facility];  Service: Orthopedics;  Laterality: Left;  . COLONOSCOPY N/A 07/08/2015   Procedure: COLONOSCOPY;  Surgeon: 07/10/2015, MD;  [facility];  Service: Endoscopy;  Laterality: N/A;  230  . ESOPHAGOGASTRODUODENOSCOPY N/A 07/08/2015   Procedure: ESOPHAGOGASTRODUODENOSCOPY (EGD);  Surgeon: 07/10/2015, MD;  [facility];  Service: Endoscopy;  Laterality: N/A;  . ESOPHAGOGASTRODUODENOSCOPY (EGD) WITH PROPOFOL N/A 09/12/2017   Procedure: ESOPHAGOGASTRODUODENOSCOPY (EGD) WITH PROPOFOL;  Surgeon: 09/14/2017, MD;  [facility];  Service: Endoscopy;  Laterality: N/A;  9:30am  . LIGAMENT REPAIR Right 1980   Knee  . MALONEY DILATION N/A 09/12/2017   Procedure: 09/14/2017 DILATION;  Surgeon: Elease Hashimoto, MD;  [facility];  Service: Endoscopy;  Laterality: N/A;    Prior to Admission medications   Medication Sig Start Date End Date Taking? Authorizing Provider  amLODipine (NORVASC) 10 MG tablet Take 1 tablet (10 mg total) by mouth daily. Must keep upcoming office visit for refills 09/28/19  Yes 11/28/19, MD  celecoxib (CELEBREX) 200 MG capsule Take 1 capsule (200 mg total) by mouth daily. 05/30/19  Yes 07/28/19, MD  COLLAGEN PO Take 3 g by mouth daily.   Yes [provider]  Flax Oil-Fish Oil-Borage Oil (FISH-FLAX-BORAGE PO) Take 1 capsule by mouth daily.   Yes [provider]  gabapentin (NEURONTIN) 800 MG tablet TAKE 1 & 1/2 (ONE & ONE-HALF) TABLETS BY MOUTH THREE TIMES DAILY Patient taking differently: Take 1,200 mg by mouth 3 (three) times daily.  10/05/19  Yes 10/07/19, MD  Lecith-Inosi-Chol-B12-Liver (LIVERITE) 1.125 MCG TABS Take 1 tablet by mouth 2 (two) times daily. LIVER AID   Yes [provider]  milk thistle 175 MG tablet Take 175 mg by mouth daily.   Yes [provider]  MITIGARE 0.6 MG CAPS Take 1 capsule by mouth twice daily 10/05/19  Yes 10/07/19, MD  Multiple Vitamin (MULTIVITAMIN WITH MINERALS) TABS tablet Take 1 tablet by mouth daily.   Yes [provider]  oxyCODONE-acetaminophen (PERCOCET) 10-325 MG tablet Take 1 tablet by  mouth 2 (two) times daily as needed for pain.   Yes [provider]  pantoprazole (PROTONIX) 40 MG tablet Take 1 tablet (40 mg total) by mouth 2 (two) times daily before a meal. Patient taking differently: Take 40 mg by mouth daily.  08/07/19  Yes Carlis Stable, NP  Tetrahydrozoline HCl (VISINE OP) Place 1 drop into both eyes daily as needed (allergies).   Yes [provider]    Allergies as of 08/07/2019 - Review Complete 08/07/2019  Allergen Reaction  Noted  . Penicillins  03/28/2011  . Tylenol [acetaminophen] Other (See Comments) 07/14/2014    Family History  Adopted: Yes  Problem Relation Age of Onset  . Colon cancer Neg Hx     Social History   Socioeconomic History  . Marital status: Single    Spouse name: Not on file  . Number of children: Not on file  . Years of education: Not on file  . Highest education level: Not on file  Occupational History  . Not on file  Tobacco Use  . Smoking status: Never Smoker  . Smokeless tobacco: Never Used  Substance and Sexual Activity  . Alcohol use: Yes    Alcohol/week: 0.0 standard drinks    Comment: 1 shot three times a week  . Drug use: Yes    Frequency: 7.0 times per week    Types: Marijuana    Comment: daily for arthritis  . Sexual activity: Not Currently    Birth control/protection: None  Other Topics Concern  . Not on file  Social History Narrative  . Not on file   Social Determinants of Health   Financial Resource Strain:   . Difficulty of Paying Living Expenses:   Food Insecurity:   . Worried About Charity fundraiser in the Last Year:   . Arboriculturist in the Last Year:   Transportation Needs:   . Film/video editor (Medical):   Marland Kitchen Lack of Transportation (Non-Medical):   Physical Activity:   . Days of Exercise per Week:   . Minutes of Exercise per Session:   Stress:   . Feeling of Stress :   Social Connections:   . Frequency of Communication with Friends and Family:   . Frequency of Social Gatherings with Friends and Family:   . Attends Religious Services:   . Active Member of Clubs or Organizations:   . Attends Archivist Meetings:   Marland Kitchen Marital Status:   Intimate Partner Violence:   . Fear of Current or Ex-Partner:   . Emotionally Abused:   Marland Kitchen Physically Abused:   . Sexually Abused:     Review of Systems: See HPI, otherwise negative ROS  Physical Exam: BP 119/71   Pulse (!) 55   Temp 98.3 F (36.8 C) (Oral)   Resp 15   LMP  09/29/2016 (Approximate)   SpO2 97%  General:   Alert,  Well-developed, well-nourished, pleasant and cooperative in NAD Neck:  Supple; no masses or thyromegaly. No significant cervical adenopathy. Lungs:  Clear throughout to auscultation.   No wheezes, crackles, or rhonchi. No acute distress. Heart:  Regular rate and rhythm; no murmurs, clicks, rubs,  or gallops. Abdomen: Non-distended, normal bowel sounds.  Soft and nontender without appreciable mass or hepatosplenomegaly.  Pulses:  Normal pulses noted. Extremities:  Without clubbing or edema.  Impression/Plan: Poorly controlled GERD.  Recurrent esophageal dysphagia.  Here for EGD with possible esophageal dilation and screening for varices. The risks, benefits, limitations, alternatives  and imponderables have been reviewed with the patient. Potential for esophageal dilation, biopsy, etc. have also been reviewed.  Questions have been answered. All parties agreeable.     Notice: This dictation was prepared with Dragon dictation along with smaller phrase technology. Any transcriptional errors that result from this process are unintentional and may not be corrected upon review.

## 2019-10-25 NOTE — Discharge Instructions (Signed)
EGD Discharge instructions Please read the instructions outlined below and refer to this sheet in the next few weeks. These discharge instructions provide you with general information on caring for yourself after you leave the hospital. Your doctor may also give you specific instructions. While your treatment has been planned according to the most current medical practices available, unavoidable complications occasionally occur. If you have any problems or questions after discharge, please call your doctor. ACTIVITY  You may resume your regular activity but move at a slower pace for the next 24 hours.   Take frequent rest periods for the next 24 hours.   Walking will help expel (get rid of) the air and reduce the bloated feeling in your abdomen.   No driving for 24 hours (because of the anesthesia (medicine) used during the test).   You may shower.   Do not sign any important legal documents or operate any machinery for 24 hours (because of the anesthesia used during the test).  NUTRITION  Drink plenty of fluids.   You may resume your normal diet.   Begin with a light meal and progress to your normal diet.   Avoid alcoholic beverages for 24 hours or as instructed by your caregiver.  MEDICATIONS  You may resume your normal medications unless your caregiver tells you otherwise.  WHAT YOU CAN EXPECT TODAY  You may experience abdominal discomfort such as a feeling of fullness or "gas" pains.  FOLLOW-UP  Your doctor will discuss the results of your test with you.  SEEK IMMEDIATE MEDICAL ATTENTION IF ANY OF THE FOLLOWING OCCUR:  Excessive nausea (feeling sick to your stomach) and/or vomiting.   Severe abdominal pain and distention (swelling).   Trouble swallowing.   Temperature over 101 F (37.8 C).   Rectal bleeding or vomiting of blood.    GERD information provided.  No esophageal varices found today which is good news.  I gave your esophagus a larger stretch than last  time (if you experience a sore throat over the next couple of days use Chloraseptic spray as needed) Stop Protonix; begin Dexilant 60 mg daily-go by my office for free samples  When you complete the free samples call my office and let me know how it is working for you and we will go from there with a prescription  Office visit with [name] in 6 months  Repeat EGD in 2 years  At patient request, I called Mauri Reading at [phone] and reviewed results  PATIENT INSTRUCTIONS POST-ANESTHESIA  IMMEDIATELY FOLLOWING SURGERY:  Do not drive or operate machinery for the first twenty four hours after surgery.  Do not make any important decisions for twenty four hours after surgery or while taking narcotic pain medications or sedatives.  If you develop intractable nausea and vomiting or a severe headache please notify your doctor immediately.  FOLLOW-UP:  Please make an appointment with your surgeon as instructed. You do not need to follow up with anesthesia unless specifically instructed to do so.  WOUND CARE INSTRUCTIONS (if applicable):  Keep a dry clean dressing on the anesthesia/puncture wound site if there is drainage.  Once the wound has quit draining you may leave it open to air.  Generally you should leave the bandage intact for twenty four hours unless there is drainage.  If the epidural site drains for more than 36-48 hours please call the anesthesia department.  QUESTIONS?:  Please feel free to call your physician or the hospital operator if you have any questions, and they  will be happy to assist you.      Gastroesophageal Reflux Disease, Adult Gastroesophageal reflux (GER) happens when acid from the stomach flows up into the tube that connects the mouth and the stomach (esophagus). Normally, food travels down the esophagus and stays in the stomach to be digested. With GER, food and stomach acid sometimes move back up into the esophagus. You may have a disease called gastroesophageal reflux  disease (GERD) if the reflux:  Happens often.  Causes frequent or very bad symptoms.  Causes problems such as damage to the esophagus. When this happens, the esophagus becomes sore and swollen (inflamed). Over time, GERD can make small holes (ulcers) in the lining of the esophagus. What are the causes? This condition is caused by a problem with the muscle between the esophagus and the stomach. When this muscle is weak or not normal, it does not close properly to keep food and acid from coming back up from the stomach. The muscle can be weak because of:  Tobacco use.  Pregnancy.  Having a certain type of hernia (hiatal hernia).  Alcohol use.  Certain foods and drinks, such as coffee, chocolate, onions, and peppermint. What increases the risk? You are more likely to develop this condition if you:  Are overweight.  Have a disease that affects your connective tissue.  Use NSAID medicines. What are the signs or symptoms? Symptoms of this condition include:  Heartburn.  Difficult or painful swallowing.  The feeling of having a lump in the throat.  A bitter taste in the mouth.  Bad breath.  Having a lot of saliva.  Having an upset or bloated stomach.  Belching.  Chest pain. Different conditions can cause chest pain. Make sure you see your doctor if you have chest pain.  Shortness of breath or noisy breathing (wheezing).  Ongoing (chronic) cough or a cough at night.  Wearing away of the surface of teeth (tooth enamel).  Weight loss. How is this treated? Treatment will depend on how bad your symptoms are. Your doctor may suggest:  Changes to your diet.  Medicine.  Surgery. Follow these instructions at home: Eating and drinking   Follow a diet as told by your doctor. You may need to avoid foods and drinks such as: ? Coffee and tea (with or without caffeine). ? Drinks that contain alcohol. ? Energy drinks and sports drinks. ? Bubbly (carbonated) drinks or  sodas. ? Chocolate and cocoa. ? Peppermint and mint flavorings. ? Garlic and onions. ? Horseradish. ? Spicy and acidic foods. These include peppers, chili powder, curry powder, vinegar, hot sauces, and BBQ sauce. ? Citrus fruit juices and citrus fruits, such as oranges, lemons, and limes. ? Tomato-based foods. These include red sauce, chili, salsa, and pizza with red sauce. ? Fried and fatty foods. These include donuts, french fries, potato chips, and high-fat dressings. ? High-fat meats. These include hot dogs, rib eye steak, sausage, ham, and bacon. ? High-fat dairy items, such as whole milk, butter, and cream cheese.  Eat small meals often. Avoid eating large meals.  Avoid drinking large amounts of liquid with your meals.  Avoid eating meals during the 2-3 hours before bedtime.  Avoid lying down right after you eat.  Do not exercise right after you eat. Lifestyle   Do not use any products that contain nicotine or tobacco. These include cigarettes, e-cigarettes, and chewing tobacco. If you need help quitting, ask your doctor.  Try to lower your stress. If you need help doing  this, ask your doctor.  If you are overweight, lose an amount of weight that is healthy for you. Ask your doctor about a safe weight loss goal. General instructions  Pay attention to any changes in your symptoms.  Take over-the-counter and prescription medicines only as told by your doctor. Do not take aspirin, ibuprofen, or other NSAIDs unless your doctor says it is okay.  Wear loose clothes. Do not wear anything tight around your waist.  Raise (elevate) the head of your bed about 6 inches (15 cm).  Avoid bending over if this makes your symptoms worse.  Keep all follow-up visits as told by your doctor. This is important. Contact a doctor if:  You have new symptoms.  You lose weight and you do not know why.  You have trouble swallowing or it hurts to swallow.  You have wheezing or a cough that  keeps happening.  Your symptoms do not get better with treatment.  You have a hoarse voice. Get help right away if:  You have pain in your arms, neck, jaw, teeth, or back.  You feel sweaty, dizzy, or light-headed.  You have chest pain or shortness of breath.  You throw up (vomit) and your throw-up looks like blood or coffee grounds.  You pass out (faint).  Your poop (stool) is bloody or black.  You cannot swallow, drink, or eat. Summary  If a person has gastroesophageal reflux disease (GERD), food and stomach acid move back up into the esophagus and cause symptoms or problems such as damage to the esophagus.  Treatment will depend on how bad your symptoms are.  Follow a diet as told by your doctor.  Take all medicines only as told by your doctor. This information is not intended to replace advice given to you by your health care provider. Make sure you discuss any questions you have with your health care provider. Document Revised: 11/16/2017 Document Reviewed: 11/16/2017 Elsevier Patient Education  2020 [employer].

## 2019-10-25 NOTE — Transfer of Care (Signed)
Immediate Anesthesia Transfer of Care Note  Patient: Rachel Hudson  Procedure(s) Performed: ESOPHAGOGASTRODUODENOSCOPY (EGD) WITH PROPOFOL (N/A ) MALONEY DILATION (N/A )  Patient Location: PACU  Anesthesia Type:MAC  Level of Consciousness: awake, alert , oriented and patient cooperative  Airway & Oxygen Therapy: Patient Spontanous Breathing and Patient connected to nasal cannula oxygen  Post-op Assessment: Report given to RN, Post -op Vital signs reviewed and stable and Patient moving all extremities  Post vital signs: Reviewed and stable  Last Vitals:  Vitals Value Taken Time  BP    Temp    Pulse 70 10/25/19 1054  Resp 19 10/25/19 1054  SpO2 93 % 10/25/19 1054  Vitals shown include unvalidated device data.  Last Pain:  Vitals:   10/25/19 1041  TempSrc:   PainSc: 0-No pain      Patients Stated Pain Goal: 10 (17/91/50 5697)  Complications: No apparent anesthesia complications

## 2019-10-25 NOTE — Anesthesia Preprocedure Evaluation (Addendum)
Anesthesia Evaluation  Patient identified by MRN, date of birth, ID band Patient awake    Reviewed: Allergy & Precautions, NPO status , Patient's Chart, lab work & pertinent test results  Airway Mallampati: II  TM Distance: >3 FB Neck ROM: Full    Dental  (+) Teeth Intact, Dental Advisory Given   Pulmonary Patient abstained from smoking.,    Pulmonary exam normal breath sounds clear to auscultation       Cardiovascular hypertension, Normal cardiovascular exam Rhythm:Regular Rate:Normal  06-Sep-2017 14:00:21 [facility] ROUTINE RECORD Sinus bradycardia Otherwise normal ECG Confirmed by Kari Baars ([id number]) on 09/12/2017 7:21:29 PM 31mm/s 64mm/mV 100Hz  9.0.4 12SL 241 HD CID: [id number] Referred by: Confirmed By   Neuro/Psych PSYCHIATRIC DISORDERS Anxiety Bipolar Disorder  Neuromuscular disease    GI/Hepatic PUD, GERD  Medicated,(+) Cirrhosis     substance abuse  marijuana use, Hepatitis -, C  Endo/Other  negative endocrine ROS  Renal/GU negative Renal ROS     Musculoskeletal  (+) Arthritis  (gout), Osteoarthritis,  Cervical spondylosis, back pain   Abdominal   Peds  Hematology negative hematology ROS (+)   Anesthesia Other Findings   Reproductive/Obstetrics                           Anesthesia Physical Anesthesia Plan  ASA: III  Anesthesia Plan: General   Post-op Pain Management:    Induction: Intravenous  PONV Risk Score and Plan: 2 and TIVA  Airway Management Planned: Nasal Cannula, Natural Airway and Simple Face Mask  Additional Equipment:   Intra-op Plan:   Post-operative Plan: Extubation in OR  Informed Consent: I have reviewed the patients History and Physical, chart, labs and discussed the procedure including the risks, benefits and alternatives for the proposed anesthesia with the patient or authorized representative who has indicated  his/her understanding and acceptance.     Dental advisory given  Plan Discussed with: CRNA and Surgeon  Anesthesia Plan Comments:         Anesthesia Quick Evaluation

## 2019-10-25 NOTE — Anesthesia Postprocedure Evaluation (Signed)
Anesthesia Post Note  Patient: ELSEY HOLTS  Procedure(s) Performed: ESOPHAGOGASTRODUODENOSCOPY (EGD) WITH PROPOFOL (N/A ) MALONEY DILATION (N/A )  Patient location during evaluation: PACU Anesthesia Type: General Level of consciousness: awake, oriented, awake and alert and patient cooperative Pain management: pain level controlled Vital Signs Assessment: post-procedure vital signs reviewed and stable Respiratory status: spontaneous breathing, respiratory function stable and nonlabored ventilation Cardiovascular status: blood pressure returned to baseline and stable Postop Assessment: no headache and no backache Anesthetic complications: no     Last Vitals:  Vitals:   10/25/19 0919  BP: 119/71  Pulse: (!) 55  Resp: 15  Temp: 36.8 C  SpO2: 97%    Last Pain:  Vitals:   10/25/19 1041  TempSrc:   PainSc: 0-No pain                 Brynda Peon

## 2019-10-25 NOTE — Op Note (Signed)
[facility] Patient Name: Rachel Hudson Procedure Date: 10/25/2019 10:24 AM MRN: [id number] Date of Birth: 03-27-1961 Attending MD: Gennette Pac , MD CSN: [id number] Age: 59 Admit Type: Outpatient Procedure:                Upper GI endoscopy Indications:              Dysphagia Providers:                Gennette Pac, MD, Criselda Peaches. Patsy Lager, RN,                            Burke Keels, Technician Referring MD:              Medicines:                Propofol per Anesthesia Complications:            No immediate complications. Estimated Blood Loss:     Estimated blood loss was minimal. Procedure:                After obtaining informed consent, the endoscope was                            passed under direct vision. Throughout the                            procedure, the patient's blood pressure, pulse, and                            oxygen saturations were monitored continuously. The                            GIF-[id number] ([id number]) scope was introduced through the                            mouth, and advanced to the second part of duodenum. Scope In: 10:43:30 AM Scope Out: 10:49:49 AM Total Procedure Duration: 0 hours 6 minutes 19 seconds  Findings:      The examined esophagus was normal aside from a couple of distal       esophageal erosions. Esophageal varices.      Mild portal hypertensive gastropathy was found in the gastric body.      The exam was otherwise normal without abnormality.      The duodenal bulb and second portion of the duodenum were normal. The       scope was withdrawn. Dilation was performed with a Maloney dilator with       mild resistance at 56 Fr. The scope was withdrawn. Dilation was       performed with a Maloney dilator with mild resistance at 58 Fr. The       dilation site was examined following endoscope reinsertion and showed no       change aside from a short superficial tear though the EUS mucosa.       Estimated blood loss was  minimal. Impression:               - Normal esophagus. Dilated. Mild erosive reflux  esophagitis                           - Portal hypertensive gastropathy.                           - The examination was otherwise normal.                           - Normal duodenal bulb and second portion of the                            duodenum.                           - No specimens collected. Moderate Sedation:      Moderate (conscious) sedation was personally administered by an       anesthesia professional. The following parameters were monitored: oxygen       saturation, heart rate, blood pressure, respiratory rate, EKG, adequacy       of pulmonary ventilation, and response to care. Recommendation:           - Patient has a contact number available for                            emergencies. The signs and symptoms of potential                            delayed complications were discussed with the                            patient. Return to normal activities tomorrow.                            Written discharge instructions were provided to the                            patient.                           - Resume previous diet.                           - Continue present medications.                           - Repeat upper endoscopy in 2 years for screening                            purposes.                           - Return to GI clinic in 6 weeks. Procedure Code(s):        --- Professional ---                           [phone], Esophagogastroduodenoscopy, flexible,  transoral; diagnostic, including collection of                            specimen(s) by brushing or washing, when performed                            (separate procedure)                           43450, Dilation of esophagus, by unguided sound or                            bougie, single or multiple passes Diagnosis Code(s):        --- Professional ---                            K76.6, Portal hypertension                           K31.89, Other diseases of stomach and duodenum                           R13.10, Dysphagia, unspecified CPT copyright 2019 American Medical Association. All rights reserved. The codes documented in this report are preliminary and upon coder review may  be revised to meet current compliance requirements. Gerrit Friends. Sherald Balbuena, MD Gennette Pac, MD 10/25/2019 11:21:46 AM This report has been signed electronically. Number of Addenda: 0

## 2019-10-26 ENCOUNTER — Encounter: Payer: Self-pay | Admitting: Internal Medicine

## 2019-10-26 ENCOUNTER — Other Ambulatory Visit (HOSPITAL_COMMUNITY)
Admission: RE | Admit: 2019-10-26 | Discharge: 2019-10-26 | Disposition: A | Discharge status: Home / Self Care | Payer: Medicaid Other | Source: Ambulatory Visit | Attending: Internal Medicine | Admitting: Internal Medicine

## 2019-10-26 ENCOUNTER — Ambulatory Visit: Payer: Medicaid Other | Attending: Internal Medicine | Admitting: Internal Medicine

## 2019-10-26 VITALS — BP 121/80 | HR 61 | Temp 97.3°F | Resp 16 | Ht 61.5 in | Wt 172.6 lb

## 2019-10-26 DIAGNOSIS — F319 Bipolar disorder, unspecified: Secondary | ICD-10-CM | POA: Diagnosis not present

## 2019-10-26 DIAGNOSIS — Z981 Arthrodesis status: Secondary | ICD-10-CM | POA: Insufficient documentation

## 2019-10-26 DIAGNOSIS — Z0000 Encounter for general adult medical examination without abnormal findings: Secondary | ICD-10-CM | POA: Diagnosis not present

## 2019-10-26 DIAGNOSIS — K7460 Unspecified cirrhosis of liver: Secondary | ICD-10-CM | POA: Insufficient documentation

## 2019-10-26 DIAGNOSIS — L304 Erythema intertrigo: Secondary | ICD-10-CM | POA: Diagnosis not present

## 2019-10-26 DIAGNOSIS — Z791 Long term (current) use of non-steroidal anti-inflammatories (NSAID): Secondary | ICD-10-CM | POA: Insufficient documentation

## 2019-10-26 DIAGNOSIS — Z79899 Other long term (current) drug therapy: Secondary | ICD-10-CM | POA: Insufficient documentation

## 2019-10-26 DIAGNOSIS — Z1231 Encounter for screening mammogram for malignant neoplasm of breast: Secondary | ICD-10-CM | POA: Diagnosis not present

## 2019-10-26 DIAGNOSIS — E669 Obesity, unspecified: Secondary | ICD-10-CM | POA: Insufficient documentation

## 2019-10-26 DIAGNOSIS — M19041 Primary osteoarthritis, right hand: Secondary | ICD-10-CM | POA: Diagnosis not present

## 2019-10-26 DIAGNOSIS — K766 Portal hypertension: Secondary | ICD-10-CM | POA: Insufficient documentation

## 2019-10-26 DIAGNOSIS — M19042 Primary osteoarthritis, left hand: Secondary | ICD-10-CM | POA: Diagnosis not present

## 2019-10-26 DIAGNOSIS — H5203 Hypermetropia, bilateral: Secondary | ICD-10-CM | POA: Diagnosis not present

## 2019-10-26 DIAGNOSIS — Z113 Encounter for screening for infections with a predominantly sexual mode of transmission: Secondary | ICD-10-CM | POA: Insufficient documentation

## 2019-10-26 DIAGNOSIS — M109 Gout, unspecified: Secondary | ICD-10-CM | POA: Insufficient documentation

## 2019-10-26 DIAGNOSIS — Z6832 Body mass index (BMI) 32.0-32.9, adult: Secondary | ICD-10-CM | POA: Diagnosis not present

## 2019-10-26 DIAGNOSIS — M47816 Spondylosis without myelopathy or radiculopathy, lumbar region: Secondary | ICD-10-CM | POA: Diagnosis not present

## 2019-10-26 DIAGNOSIS — I10 Essential (primary) hypertension: Secondary | ICD-10-CM | POA: Diagnosis not present

## 2019-10-26 MED ORDER — NYSTATIN 100000 UNIT/GM EX CREA: 1.0000 "application " | TOPICAL_CREAM | Freq: Two times a day (BID) | CUTANEOUS | 0 refills | Status: DC

## 2019-10-26 MED ORDER — AMLODIPINE BESYLATE 10 MG PO TABS: 10.0000 mg | ORAL_TABLET | Freq: Every day | ORAL | 6 refills | Status: DC

## 2019-10-26 MED ORDER — CELECOXIB 200 MG PO CAPS: 200.0000 mg | ORAL_CAPSULE | Freq: Every day | ORAL | 6 refills | Status: DC

## 2019-10-26 MED ORDER — MITIGARE 0.6 MG PO CAPS: 1.0000 | ORAL_CAPSULE | Freq: Two times a day (BID) | ORAL | 6 refills | Status: DC

## 2019-10-26 MED ORDER — MUPIROCIN CALCIUM 2 % EX CREA: 1.0000 "application " | TOPICAL_CREAM | Freq: Two times a day (BID) | CUTANEOUS | 0 refills | Status: DC

## 2019-10-26 NOTE — Progress Notes (Signed)
Patient ID: Rachel Hudson, female    DOB: [birth year]  MRN: [id number]  CC: Annual Exam   Subjective: Rachel Hudson is a 59 y.o. female who presents for physical exam Her concerns today include:  Patient with history of HTN, hepatitis C(treated)with cirrhosis followed by GI, GERD and chronic lower back pain withhxsurgery for spinal fixation of L4-L5, OA hands, bipolar affective ds, CTS hands.   Had EGD yesterday for persistent GERD and dysphagia.  This revealed mild erosive reflux esophagitis and some portal hypertension in the stomach..   HTN: Reports compliance with amlodipine and low-salt diet.  No chest pains or shortness of breath.  No lower extremity swelling.  Obesity: Walk 3 miles every morning.  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  She eats lean cuisine for dinner when she does not cook.  Overall she feels fairly healthy  OA hands: saw hand specialist.  scheduled for surgery RT index finger hand 04/2019 Mitigare works well for gout and eating cherries.  No recent flare of gout.   Patient Active Problem List   Diagnosis Date Noted  . Bipolar affective disorder, currently manic, mild (HCC) 09/12/2017  . Dysphagia 08/24/2017  . Neuropathy of both feet 04/05/2017  . Status post lumbar spinal fusion 10/22/2016  . GERD (gastroesophageal reflux disease) 08/09/2016  . Spondylolisthesis of lumbar region 07/29/2016  . Sinus bradycardia 06/15/2016  . Gingivitis 06/15/2016  . ASCUS favor benign 11/13/2015  . History of hepatitis C 10/13/2015  . Bilateral carpal tunnel syndrome 09/16/2015  . Cervical spondylosis without myelopathy 09/16/2015  . Primary osteoarthritis of both hands 09/16/2015  . Diverticulosis of colon without hemorrhage   . Peptic ulcer disease   . 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 Lumbago 11/18/2014  . Carpal tunnel syndrome 11/18/2014  .  Hepatic cirrhosis (HCC) 08/06/2014  . HTN (hypertension) 02/28/2014     Current Outpatient Medications on File Prior to Visit  Medication Sig Dispense Refill  . amLODipine (NORVASC) 10 MG tablet Take 1 tablet (10 mg total) by mouth daily. Must keep upcoming office visit for refills 30 tablet 0  . celecoxib (CELEBREX) 200 MG capsule Take 1 capsule (200 mg total) by mouth daily. 30 capsule 6  . COLLAGEN PO Take 3 g by mouth daily.    . Flax Oil-Fish Oil-Borage Oil (FISH-FLAX-BORAGE PO) Take 1 capsule by mouth daily.    Marland Kitchen gabapentin (NEURONTIN) 800 MG tablet TAKE 1 & 1/2 (ONE & ONE-HALF) TABLETS BY MOUTH THREE TIMES DAILY (Patient taking differently: Take 1,200 mg by mouth 3 (three) times daily. ) 135 tablet 2  . Lecith-Inosi-Chol-B12-Liver (LIVERITE) 1.125 MCG TABS Take 1 tablet by mouth 2 (two) times daily. LIVER AID    . milk thistle 175 MG tablet Take 175 mg by mouth daily.    Marland Kitchen MITIGARE 0.6 MG CAPS Take 1 capsule by mouth twice daily 60 capsule 0  . Multiple Vitamin (MULTIVITAMIN WITH MINERALS) TABS tablet Take 1 tablet by mouth daily.    Marland Kitchen oxyCODONE-acetaminophen (PERCOCET) 10-325 MG tablet Take 1 tablet by mouth 2 (two) times daily as needed for pain.    . Tetrahydrozoline HCl (VISINE OP) Place 1 drop into both eyes daily as needed (allergies).     No current facility-administered medications on file prior to visit.    Allergies  Allergen Reactions  . Penicillins     Facial swelling Has patient had a  PCN reaction causing immediate rash, facial/tongue/throat swelling, SOB or lightheadedness with hypotension: Yes Has patient had a PCN reaction causing severe rash involving mucus membranes or skin necrosis: No Has patient had a PCN reaction that required hospitalization No Has patient had a PCN reaction occurring within the last 10 years: Yes If all of the above answers are "NO", then may proceed with Cephalosporin use.   . Tylenol [Acetaminophen] Other (See Comments)    HX of Hep. C     Social History   Socioeconomic History  . Marital status: Single    Spouse name: Not on file  . Number of children: Not on file  . Years of education: Not on file  . Highest education level: Not on file  Occupational History  . Not on file  Tobacco Use  . Smoking status: Never Smoker  . Smokeless tobacco: Never Used  Substance and Sexual Activity  . Alcohol use: Yes    Alcohol/week: 0.0 standard drinks    Comment: 1 shot three times a week  . Drug use: Yes    Frequency: 7.0 times per week    Types: Marijuana    Comment: daily for arthritis  . Sexual activity: Not Currently    Birth control/protection: None  Other Topics Concern  . Not on file  Social History Narrative  . Not on file   Social Determinants of Health   Financial Resource Strain:   . Difficulty of Paying Living Expenses:   Food Insecurity:   . Worried About Programme researcher, broadcasting/film/video in the Last Year:   . Barista in the Last Year:   Transportation Needs:   . Freight forwarder (Medical):   Marland Kitchen Lack of Transportation (Non-Medical):   Physical Activity:   . Days of Exercise per Week:   . Minutes of Exercise per Session:   Stress:   . Feeling of Stress :   Social Connections:   . Frequency of Communication with Friends and Family:   . Frequency of Social Gatherings with Friends and Family:   . Attends Religious Services:   . Active Member of Clubs or Organizations:   . Attends Banker Meetings:   Marland Kitchen Marital Status:   Intimate Partner Violence:   . Fear of Current or Ex-Partner:   . Emotionally Abused:   Marland Kitchen Physically Abused:   . Sexually Abused:     Family History  Adopted: Yes  Problem Relation Age of Onset  . Colon cancer Neg Hx     Past Surgical History:  Procedure Laterality Date  . BACK SURGERY     lumbar fusion  . BIOPSY  09/12/2017   Procedure: BIOPSY;  Surgeon: Corbin Ade, MD;  Location: [facility];  Service: Endoscopy;;  gastric  . CARPAL TUNNEL RELEASE  Right 11/13/2015   Procedure: RIGHT CARPAL TUNNEL RELEASE;  Surgeon: Betha Loa, MD;  Location: [facility];  Service: Orthopedics;  Laterality: Right;  . CARPAL TUNNEL RELEASE Left 01/29/2016   Procedure: left CARPAL TUNNEL RELEASE;  Surgeon: Betha Loa, MD;  Location: [facility];  Service: Orthopedics;  Laterality: Left;  . COLONOSCOPY N/A 07/08/2015   Procedure: COLONOSCOPY;  Surgeon: Corbin Ade, MD;  Location: [facility];  Service: Endoscopy;  Laterality: N/A;  230  . ESOPHAGOGASTRODUODENOSCOPY N/A 07/08/2015   Procedure: ESOPHAGOGASTRODUODENOSCOPY (EGD);  Surgeon: Corbin Ade, MD;  Location: [facility];  Service: Endoscopy;  Laterality: N/A;  . ESOPHAGOGASTRODUODENOSCOPY (EGD) WITH PROPOFOL  N/A 09/12/2017   Procedure: ESOPHAGOGASTRODUODENOSCOPY (EGD) WITH PROPOFOL;  Surgeon: Corbin Ade, MD;  Location: [facility];  Service: Endoscopy;  Laterality: N/A;  9:30am  . LIGAMENT REPAIR Right 1980   Knee  . MALONEY DILATION N/A 09/12/2017   Procedure: Elease Hashimoto DILATION;  Surgeon: Corbin Ade, MD;  Location: [facility];  Service: Endoscopy;  Laterality: N/A;    ROS: Review of Systems  Constitutional: Negative for activity change, appetite change and fatigue.  Eyes:       Has problems seeing things close up.  She has never had a formal eye exam.  Respiratory: Negative for shortness of breath.   Cardiovascular: Negative for chest pain and palpitations.  Gastrointestinal:       She was having some issues swallowing but reports having EGD yesterday with some dilation.  She continues to follow-up with GI for persistent GERD symptoms  Genitourinary: Negative for difficulty urinating.       Requests STD screen.  She currently has a little bit of white discharge.  Treated for trichomonas last year.  However she states that her then boyfriend she thinks did not get treated.  She also requests HIV test.  Skin:       Requesting a refill on an antifungal  cream to keep on hand to use as needed for intertrigo.  She gets intertrigo under the breasts especially in the summertime. Also reports getting small boils at times on the legs and requesting a cream for that.  She thinks the cream that I have given her in the past is Bactroban.   PHYSICAL EXAM: BP 121/80   Pulse 61   Temp (!) 97.3 F (36.3 C)   Resp 16   Ht 5' 1.5" (1.562 m)   Wt 172 lb 9.6 oz (78.3 kg)   LMP 09/29/2016 (Approximate)   SpO2 97%   BMI 32.08 kg/m   Physical Exam  General appearance - alert, well appearing, and in no distress Mental status - normal mood, behavior, speech, dress, motor activity, and thought processes Eyes - pupils equal and reactive, extraocular eye movements intact Ears - bilateral TM's and external ear canals normal Nose - normal and patent, no erythema, discharge or polyps Mouth - mucous membranes moist, pharynx normal without lesions Neck - supple, no significant adenopathy Lymphatics - no palpable lymphadenopathy, no hepatosplenomegaly Chest - clear to auscultation, no wheezes, rales or rhonchi, symmetric air entry Heart - normal rate, regular rhythm, normal S1, S2, no murmurs, rubs, clicks or gallops Abdomen - soft, nontender, nondistended, no masses or organomegaly Breasts -nipples are inverted.  No abnormal mass palpated.   Musculoskeletal -enlargement of the MCP joint of the right index finger.  Enlargement of the PIP and DIP joints on both hands. Extremities -no lower extremity edema  skin -mild hyperpigmented waxy appearing rash under the left breast   CMP Latest Ref Rng & Units 08/14/2019 03/02/2018 09/06/2017  Glucose 70 - 99 mg/dL 92 91 573(U)  BUN 6 - 20 mg/dL 10 10 11   Creatinine 0.44 - 1.00 mg/dL 2.02 5.42  Sodium 135 - 145 mmol/L 140 140 138  Potassium 3.5 - 5.1 mmol/L 4.4 3.8 3.4(L)  Chloride 98 - 111 mmol/L 104 101 103  CO2 22 - 32 mmol/L 26 25 25   Calcium 8.9 - 10.3 mg/dL 9.5 9.3 9.5  Total Protein 6.5 - 8.1 g/dL 7.3  7.5 7.8  Total Bilirubin 0.3 - 1.2 mg/dL 0.6 0.6 0.4  Alkaline Phos 38 - 126  U/L 66 57 61  AST 15 - 41 U/L 24 24 19   ALT 0 - 44 U/L 21 18 13(L)   Lipid Panel     Component Value Date/Time   CHOL 211 (H) 08/11/2017 0957   TRIG 71 08/11/2017 0957   HDL 60 08/11/2017 0957   CHOLHDL 3.5 08/11/2017 0957   CHOLHDL 2.8 09/28/2013 0904   VLDL 25 09/28/2013 0904   LDLCALC 137 (H) 08/11/2017 0957    CBC    Component Value Date/Time   WBC 7.5 08/14/2019 1105   RBC 4.67 08/14/2019 1105   HGB 13.6 08/14/2019 1105   HGB 13.8 08/11/2017 0957   HCT 41.6 08/14/2019 1105   HCT 41.7 08/11/2017 0957   PLT 190 08/14/2019 1105   PLT 171 08/11/2017 0957   MCV 89.1 08/14/2019 1105   MCV 91 08/11/2017 0957   MCH 29.1 08/14/2019 1105   MCHC 32.7 08/14/2019 1105   RDW 14.3 08/14/2019 1105   RDW 14.8 08/11/2017 0957   LYMPHSABS 2.5 03/02/2018 0904   MONOABS 0.5 03/02/2018 0904   EOSABS 0.2 03/02/2018 0904   BASOSABS 0.0 03/02/2018 0904    ASSESSMENT AND PLAN:  1. Annual physical exam Patient is up-to-date for age-appropriate health maintenance.  She will be due for mammogram again the end of next month.  Discussed and encouraged her to continue healthy eating habits and regular exercise.  2. Essential hypertension At goal.  Continue amlodipine - Lipid panel - amLODipine (NORVASC) 10 MG tablet; Take 1 tablet (10 mg total) by mouth daily. Must keep upcoming office visit for refills  Dispense: 30 tablet; Refill: 6  3. Arthritis of both hands - celecoxib (CELEBREX) 200 MG capsule; Take 1 capsule (200 mg total) by mouth daily.  Dispense: 30 capsule; Refill: 6  4. Intertrigo Advised patient to try to keep the skin clean and dry under both breasts - nystatin cream (MYCOSTATIN); Apply 1 application topically 2 (two) times daily.  Dispense: 30 g; Refill: 0  5. Encounter for screening mammogram for malignant neoplasm of breast - MM Digital Screening; Future  6. Obesity (BMI 30-39.9) See #1  above - Lipid panel  7. Screen for STD (sexually transmitted disease) Safe sex practices discussed and encouraged. - Cervicovaginal ancillary only - HIV Antibody (routine testing w rflx)  8. Hyperopia of both eyes - Ambulatory referral to Ophthalmology  Patient was given the opportunity to ask questions.  Patient verbalized understanding of the plan and was able to repeat key elements of the plan.   No orders of the defined types were placed in this encounter.    Requested Prescriptions    No prescriptions requested or ordered in this encounter    No follow-ups on file.  Karle Plumber, MD, FACP

## 2019-10-26 NOTE — Patient Instructions (Signed)

## 2019-10-27 LAB — LIPID PANEL
Chol/HDL Ratio: 5.1 ratio — ABNORMAL HIGH (ref 0.0–4.4)
Cholesterol, Total: 215 mg/dL — ABNORMAL HIGH (ref 100–199)
HDL: 42 mg/dL (ref >39)
LDL Chol Calc (NIH): 145 mg/dL — ABNORMAL HIGH (ref 0–99)
Triglycerides: 152 mg/dL — ABNORMAL HIGH (ref 0–149)
VLDL Cholesterol Cal: 28 mg/dL (ref 5–40)

## 2019-10-27 LAB — HIV ANTIBODY (ROUTINE TESTING W REFLEX): HIV Screen 4th Generation wRfx: NONREACTIVE

## 2019-10-29 ENCOUNTER — Other Ambulatory Visit: Payer: Self-pay | Admitting: Family Medicine

## 2019-10-29 LAB — CERVICOVAGINAL ANCILLARY ONLY
Bacterial Vaginitis (gardnerella): POSITIVE — AB
Candida Glabrata: NEGATIVE
Candida Vaginitis: POSITIVE — AB
Chlamydia: NEGATIVE
Comment: NEGATIVE
Comment: NEGATIVE
Comment: NEGATIVE
Comment: NEGATIVE
Comment: NEGATIVE
Comment: NORMAL
Neisseria Gonorrhea: NEGATIVE
Trichomonas: NEGATIVE

## 2019-10-29 MED ORDER — FLUCONAZOLE 150 MG PO TABS
150.0000 mg | ORAL_TABLET | Freq: Once | ORAL | 0 refills | Status: AC
Start: 2019-10-29 — End: 2019-10-29

## 2019-10-29 MED ORDER — METRONIDAZOLE 0.75 % VA GEL: 1.0000 | Freq: Every day | VAGINAL | 0 refills | Status: DC

## 2019-10-30 ENCOUNTER — Telehealth: Payer: Self-pay

## 2019-10-30 NOTE — Telephone Encounter (Signed)
Contacted pt to go over swab results pt is aware and doesn't have any questions or concerns  

## 2019-10-31 ENCOUNTER — Telehealth: Payer: Self-pay | Admitting: Internal Medicine

## 2019-10-31 DIAGNOSIS — Z79899 Other long term (current) drug therapy: Secondary | ICD-10-CM | POA: Diagnosis not present

## 2019-10-31 DIAGNOSIS — M542 Cervicalgia: Secondary | ICD-10-CM | POA: Diagnosis not present

## 2019-10-31 DIAGNOSIS — M546 Pain in thoracic spine: Secondary | ICD-10-CM | POA: Diagnosis not present

## 2019-10-31 DIAGNOSIS — M545 Low back pain: Secondary | ICD-10-CM | POA: Diagnosis not present

## 2019-10-31 DIAGNOSIS — M129 Arthropathy, unspecified: Secondary | ICD-10-CM | POA: Diagnosis not present

## 2019-10-31 DIAGNOSIS — G8929 Other chronic pain: Secondary | ICD-10-CM | POA: Diagnosis not present

## 2019-10-31 DIAGNOSIS — E559 Vitamin D deficiency, unspecified: Secondary | ICD-10-CM | POA: Diagnosis not present

## 2019-10-31 NOTE — Telephone Encounter (Signed)
Patient called and said the dexilant samples worked and would like a prescription sent to [facility] on [address]

## 2019-10-31 NOTE — Telephone Encounter (Signed)
Pt started Dxilant s/p her procedure on 10/25/19. Dexilant is working well. Is it ok to send RX in? Please advise.

## 2019-11-01 ENCOUNTER — Other Ambulatory Visit: Payer: Self-pay

## 2019-11-01 MED ORDER — DEXILANT 60 MG PO CPDR
60.0000 mg | DELAYED_RELEASE_CAPSULE | Freq: Every day | ORAL | 11 refills | Status: DC
Start: 2019-11-01 — End: 2020-11-13

## 2019-11-01 NOTE — Telephone Encounter (Signed)
Okay.  Dexilant 60 mg capsule.  Dispense 30.  1 daily.  11 refills.

## 2019-11-01 NOTE — Telephone Encounter (Signed)
Noted. RX Dexilant 60 mg daily #30 with 11 rfs sent to pts pharmacy. Pt is aware.

## 2019-11-15 ENCOUNTER — Telehealth: Payer: Self-pay | Admitting: Internal Medicine

## 2019-11-15 NOTE — Telephone Encounter (Signed)
Patient called saying she would like her PCP to prescribe her Triamcinolone acetonide ointment because she states that the other ointment that was prescribed is not being covered by her insurance at this time. Please f/u

## 2019-11-16 NOTE — Telephone Encounter (Signed)
Will forward to pcp

## 2019-11-17 MED ORDER — TRIAMCINOLONE ACETONIDE 0.1 % EX CREA
1.0000 | TOPICAL_CREAM | Freq: Two times a day (BID) | CUTANEOUS | 0 refills | Status: DC
Start: 2019-11-17 — End: 2020-04-15

## 2019-11-30 DIAGNOSIS — Z79899 Other long term (current) drug therapy: Secondary | ICD-10-CM | POA: Diagnosis not present

## 2019-12-19 ENCOUNTER — Ambulatory Visit
Admission: RE | Admit: 2019-12-19 | Discharge: 2019-12-19 | Disposition: A | Discharge status: Home / Self Care | Payer: Medicaid Other | Source: Ambulatory Visit | Attending: Internal Medicine | Admitting: Internal Medicine

## 2019-12-19 ENCOUNTER — Other Ambulatory Visit: Payer: Self-pay

## 2019-12-19 DIAGNOSIS — Z1231 Encounter for screening mammogram for malignant neoplasm of breast: Secondary | ICD-10-CM

## 2019-12-24 ENCOUNTER — Telehealth: Payer: Self-pay

## 2019-12-24 NOTE — Telephone Encounter (Signed)
Contacted pt to go over MM results pt didn't answer left a detailed vm informing pt of results and if she has any questions or concerns to give a call  

## 2019-12-31 ENCOUNTER — Telehealth: Payer: Self-pay | Admitting: Internal Medicine

## 2019-12-31 NOTE — Telephone Encounter (Signed)
Will forward to pcp

## 2019-12-31 NOTE — Telephone Encounter (Signed)
Patient came in saying that her PCP needs to authorize her dexlansoprazole (DEXILANT) 60 MG capsule Patient states her insurance is asking her so they can cover her medication.

## 2020-01-08 NOTE — Telephone Encounter (Signed)
A PA was approved thru 01/01/21

## 2020-01-28 ENCOUNTER — Other Ambulatory Visit: Payer: Self-pay | Admitting: Internal Medicine

## 2020-01-28 DIAGNOSIS — G5603 Carpal tunnel syndrome, bilateral upper limbs: Secondary | ICD-10-CM

## 2020-01-28 DIAGNOSIS — M47816 Spondylosis without myelopathy or radiculopathy, lumbar region: Secondary | ICD-10-CM

## 2020-01-31 DIAGNOSIS — Z79899 Other long term (current) drug therapy: Secondary | ICD-10-CM | POA: Diagnosis not present

## 2020-02-07 ENCOUNTER — Ambulatory Visit: Payer: Medicaid Other | Admitting: Nurse Practitioner

## 2020-02-07 ENCOUNTER — Telehealth: Payer: Self-pay

## 2020-02-07 NOTE — Telephone Encounter (Signed)
Patient came into office wanting to let Dr know she would like a referral or to start the process with getting a breast reduction     Please call and advise

## 2020-02-07 NOTE — Telephone Encounter (Signed)
Will forward to pcp

## 2020-02-08 NOTE — Telephone Encounter (Signed)
Please contact pt and schedule and virtual visit

## 2020-02-18 NOTE — Telephone Encounter (Signed)
Patient has an appointment scheduled for 10/5.

## 2020-02-20 ENCOUNTER — Other Ambulatory Visit: Payer: Self-pay | Admitting: Orthopedic Surgery

## 2020-02-26 ENCOUNTER — Ambulatory Visit: Payer: Medicaid Other | Attending: Internal Medicine | Admitting: Internal Medicine

## 2020-02-26 ENCOUNTER — Ambulatory Visit (HOSPITAL_BASED_OUTPATIENT_CLINIC_OR_DEPARTMENT_OTHER): Payer: Medicaid Other | Admitting: Pharmacist

## 2020-02-26 ENCOUNTER — Other Ambulatory Visit: Payer: Self-pay

## 2020-02-26 ENCOUNTER — Encounter: Payer: Self-pay | Admitting: Internal Medicine

## 2020-02-26 VITALS — BP 134/76 | HR 73 | Resp 16 | Wt 173.6 lb

## 2020-02-26 DIAGNOSIS — N760 Acute vaginitis: Secondary | ICD-10-CM

## 2020-02-26 DIAGNOSIS — E669 Obesity, unspecified: Secondary | ICD-10-CM

## 2020-02-26 DIAGNOSIS — I10 Essential (primary) hypertension: Secondary | ICD-10-CM | POA: Diagnosis not present

## 2020-02-26 DIAGNOSIS — Z23 Encounter for immunization: Secondary | ICD-10-CM | POA: Diagnosis not present

## 2020-02-26 MED ORDER — FLUCONAZOLE 150 MG PO TABS: 150.0000 mg | ORAL_TABLET | Freq: Once | ORAL | 0 refills | Status: AC

## 2020-02-26 NOTE — Progress Notes (Signed)
Patient presents for vaccination against influenza per orders of Dr. Johnson. Consent given. Counseling provided. No contraindications exists. Vaccine administered without incident.  ° °Luke Van Ausdall, PharmD, CPP °Clinical Pharmacist °[facility] °[phone] ° °

## 2020-02-26 NOTE — Progress Notes (Signed)
Rachel Hudson ID: Rachel Hudson, female    DOB: Mar 29, 1961  MRN: [id number]  CC: Hypertension   Subjective: Rachel Hudson is a 59 y.o. female who presents for chronic ds management Rachel Hudson concerns today include:  Rachel Hudson with history of HTN, HL, hepatitis C(treated)with cirrhosis followed by GI, GERD and chronic lower back pain withhxsurgery for spinal fixation of L4-L5, OA hands, bipolar affective ds, CTS hands.  Wants to have breast reduction because she has problems finding correct bra sizes but then she decided against doing so.  Now feels breast are too flat when she is laying down.   Feels she has vaginal yeast infection again.  Uses Summer Eva and Vagagel Body wash. No dischg or irritation at this time. "Just feels sticky."  HTN:  Compliant with Norvasc and salt restriction but has been eating country ham daily for the past several days. BP was elev at Rachel Hudson visit with the pain specialist 1 mth ago.  Obesity:  Still very active mowing yards. She mows 6-9 yrs a wk.  She also walks a mile with Rachel Hudson dogs/day. -eat BF and dinner.  Dinner is usually a sandwich.  She snacks on rice pudding and Reece Thins.  She is discouraged that she is not losing weight.  HM: due for flu shot.  Agreeable to having it done today.  Rachel Hudson Active Problem List   Diagnosis Date Noted  . Influenza vaccine needed 02/26/2020  . Bipolar affective disorder, currently manic, mild (HCC) 09/12/2017  . Dysphagia 08/24/2017  . Neuropathy of both feet 04/05/2017  . Status post lumbar spinal fusion 10/22/2016  . GERD (gastroesophageal reflux disease) 08/09/2016  . Spondylolisthesis of lumbar region 07/29/2016  . Sinus bradycardia 06/15/2016  . Gingivitis 06/15/2016  . ASCUS favor benign 11/13/2015  . History of hepatitis C 10/13/2015  . Bilateral carpal tunnel syndrome 09/16/2015  . Cervical spondylosis without myelopathy 09/16/2015  . Primary osteoarthritis of both hands 09/16/2015  . Diverticulosis of  colon without hemorrhage   . Peptic ulcer disease   . 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 Lumbago 11/18/2014  . Carpal tunnel syndrome 11/18/2014  . Hepatic cirrhosis (HCC) 08/06/2014  . HTN (hypertension) 02/28/2014     Current Outpatient Medications on File Prior to Visit  Medication Sig Dispense Refill  . amLODipine (NORVASC) 10 MG tablet Take 1 tablet (10 mg total) by mouth daily. Must keep upcoming office visit for refills 30 tablet 6  . celecoxib (CELEBREX) 200 MG capsule Take 1 capsule (200 mg total) by mouth daily. 30 capsule 6  . COLLAGEN PO Take 3 g by mouth daily.    Marland Kitchen dexlansoprazole (DEXILANT) 60 MG capsule Take 1 capsule (60 mg total) by mouth daily. 30 capsule 11  . Flax Oil-Fish Oil-Borage Oil (FISH-FLAX-BORAGE PO) Take 1 capsule by mouth daily.    Marland Kitchen gabapentin (NEURONTIN) 800 MG tablet TAKE 1 AND 1/2 (ONE AND ONE-HALF) TABLETS BY MOUTH THREE TIMES DAILY 135 tablet 0  . Lecith-Inosi-Chol-B12-Liver (LIVERITE) 1.125 MCG TABS Take 1 tablet by mouth 2 (two) times daily. LIVER AID    . milk thistle 175 MG tablet Take 175 mg by mouth daily.    Marland Kitchen MITIGARE 0.6 MG CAPS Take 1 capsule by mouth 2 (two) times daily. 60 capsule 6  . Multiple Vitamin (MULTIVITAMIN WITH MINERALS) TABS tablet Take 1 tablet by mouth daily.    Marland Kitchen oxyCODONE-acetaminophen (PERCOCET) 10-325 MG tablet Take 1 tablet by mouth 2 (two) times daily as needed  for pain.    . Tetrahydrozoline HCl (VISINE OP) Place 1 drop into both eyes daily as needed (allergies).    . triamcinolone cream (KENALOG) 0.1 % Apply 1 application topically 2 (two) times daily. (Rachel Hudson not taking: Reported on 02/26/2020) 30 g 0   No current facility-administered medications on file prior to visit.    Allergies  Allergen Reactions  . Penicillins     Facial swelling Has Rachel Hudson had a PCN reaction causing immediate rash, facial/tongue/throat swelling, SOB or lightheadedness with hypotension: Yes Has  Rachel Hudson had a PCN reaction causing severe rash involving mucus membranes or skin necrosis: No Has Rachel Hudson had a PCN reaction that required hospitalization No Has Rachel Hudson had a PCN reaction occurring within the last 10 years: Yes If all of the above answers are "NO", then may proceed with Cephalosporin use.   . Tylenol [Acetaminophen] Other (See Comments)    HX of Hep. C    Social History   Socioeconomic History  . Marital status: Single    Spouse name: Not on file  . Number of children: Not on file  . Years of education: Not on file  . Highest education level: Not on file  Occupational History  . Not on file  Tobacco Use  . Smoking status: Never Smoker  . Smokeless tobacco: Never Used  Vaping Use  . Vaping Use: Never used  Substance and Sexual Activity  . Alcohol use: Yes    Alcohol/week: 0.0 standard drinks    Comment: 1 shot three times a week  . Drug use: Yes    Frequency: 7.0 times per week    Types: Marijuana    Comment: daily for arthritis  . Sexual activity: Not Currently    Birth control/protection: None  Other Topics Concern  . Not on file  Social History Narrative  . Not on file   Social Determinants of Health   Financial Resource Strain:   . Difficulty of Paying Living Expenses: Not on file  Food Insecurity:   . Worried About Programme researcher, broadcasting/film/video in the Last Year: Not on file  . Ran Out of Food in the Last Year: Not on file  Transportation Needs:   . Lack of Transportation (Medical): Not on file  . Lack of Transportation (Non-Medical): Not on file  Physical Activity:   . Days of Exercise per Week: Not on file  . Minutes of Exercise per Session: Not on file  Stress:   . Feeling of Stress : Not on file  Social Connections:   . Frequency of Communication with Friends and Family: Not on file  . Frequency of Social Gatherings with Friends and Family: Not on file  . Attends Religious Services: Not on file  . Active Member of Clubs or Organizations: Not on  file  . Attends Banker Meetings: Not on file  . Marital Status: Not on file  Intimate Partner Violence:   . Fear of Current or Ex-Partner: Not on file  . Emotionally Abused: Not on file  . Physically Abused: Not on file  . Sexually Abused: Not on file    Family History  Adopted: Yes  Problem Relation Age of Onset  . Colon cancer Neg Hx     Past Surgical History:  Procedure Laterality Date  . BACK SURGERY     lumbar fusion  . BIOPSY  09/12/2017   Procedure: BIOPSY;  Surgeon: Corbin Ade, MD;  Location: [facility];  Service: Endoscopy;;  gastric  .  CARPAL TUNNEL RELEASE Right 11/13/2015   Procedure: RIGHT CARPAL TUNNEL RELEASE;  Surgeon: Betha Loa, MD;  Location: [facility];  Service: Orthopedics;  Laterality: Right;  . CARPAL TUNNEL RELEASE Left 01/29/2016   Procedure: left CARPAL TUNNEL RELEASE;  Surgeon: Betha Loa, MD;  Location: [facility];  Service: Orthopedics;  Laterality: Left;  . COLONOSCOPY N/A 07/08/2015   Procedure: COLONOSCOPY;  Surgeon: Corbin Ade, MD;  Location: [facility];  Service: Endoscopy;  Laterality: N/A;  230  . ESOPHAGOGASTRODUODENOSCOPY N/A 07/08/2015   Procedure: ESOPHAGOGASTRODUODENOSCOPY (EGD);  Surgeon: Corbin Ade, MD;  Location: [facility];  Service: Endoscopy;  Laterality: N/A;  . ESOPHAGOGASTRODUODENOSCOPY (EGD) WITH PROPOFOL N/A 09/12/2017   Procedure: ESOPHAGOGASTRODUODENOSCOPY (EGD) WITH PROPOFOL;  Surgeon: Corbin Ade, MD;  Location: [facility];  Service: Endoscopy;  Laterality: N/A;  9:30am  . ESOPHAGOGASTRODUODENOSCOPY (EGD) WITH PROPOFOL N/A 10/25/2019   Procedure: ESOPHAGOGASTRODUODENOSCOPY (EGD) WITH PROPOFOL;  Surgeon: Corbin Ade, MD;  Location: [facility];  Service: Endoscopy;  Laterality: N/A;  10:30am  . LIGAMENT REPAIR Right 1980   Knee  . MALONEY DILATION N/A 09/12/2017   Procedure: Elease Hashimoto DILATION;  Surgeon: Corbin Ade, MD;  Location: [facility];   Service: Endoscopy;  Laterality: N/A;  . Elease Hashimoto DILATION N/A 10/25/2019   Procedure: Elease Hashimoto DILATION;  Surgeon: Corbin Ade, MD;  Location: [facility];  Service: Endoscopy;  Laterality: N/A;    ROS: Review of Systems Negative except as stated above  PHYSICAL EXAM: BP 134/76   Pulse 73   Resp 16   Wt 173 lb 9.6 oz (78.7 kg)   LMP 09/29/2016 (Approximate)   SpO2 95%   BMI 32.27 kg/m   Wt Readings from Last 3 Encounters:  02/26/20 173 lb 9.6 oz (78.7 kg)  10/26/19 172 lb 9.6 oz (78.3 kg)  08/07/19 178 lb 12.8 oz (81.1 kg)   Physical Exam  General appearance - alert, well appearing, and in no distress Mental status - normal mood, behavior, speech, dress, motor activity, and thought processes Neck - supple, no significant adenopathy Chest - clear to auscultation, no wheezes, rales or rhonchi, symmetric air entry Heart - normal rate, regular rhythm, normal S1, S2, no murmurs, rubs, clicks or gallops Extremities - peripheral pulses normal, no pedal edema, no clubbing or cyanosis   CMP Latest Ref Rng & Units 08/14/2019 03/02/2018 09/06/2017  Glucose 70 - 99 mg/dL 92 91 992(E)  BUN 6 - 20 mg/dL 10 10 11   Creatinine 0.44 - 1.00 mg/dL 2.68 3.41  Sodium 135 - 145 mmol/L 140 140 138  Potassium 3.5 - 5.1 mmol/L 4.4 3.8 3.4(L)  Chloride 98 - 111 mmol/L 104 101 103  CO2 22 - 32 mmol/L 26 25 25   Calcium 8.9 - 10.3 mg/dL 9.5 9.3 9.5  Total Protein 6.5 - 8.1 g/dL 7.3 7.5 7.8  Total Bilirubin 0.3 - 1.2 mg/dL 0.6 0.6 0.4  Alkaline Phos 38 - 126 U/L 66 57 61  AST 15 - 41 U/L 24 24 19   ALT 0 - 44 U/L 21 18 13(L)   Lipid Panel     Component Value Date/Time   CHOL 215 (H) 10/26/2019 1022   TRIG 152 (H) 10/26/2019 1022   HDL 42 10/26/2019 1022   CHOLHDL 5.1 (H) 10/26/2019 1022   CHOLHDL 2.8 09/28/2013 0904   VLDL 25 09/28/2013 0904   LDLCALC 145 (H) 10/26/2019 1022    CBC    Component Value  Date/Time   WBC 7.5 08/14/2019 1105   RBC 4.67 08/14/2019 1105   HGB 13.6  08/14/2019 1105   HGB 13.8 08/11/2017 0957   HCT 41.6 08/14/2019 1105   HCT 41.7 08/11/2017 0957   PLT 190 08/14/2019 1105   PLT 171 08/11/2017 0957   MCV 89.1 08/14/2019 1105   MCV 91 08/11/2017 0957   MCH 29.1 08/14/2019 1105   MCHC 32.7 08/14/2019 1105   RDW 14.3 08/14/2019 1105   RDW 14.8 08/11/2017 0957   LYMPHSABS 2.5 03/02/2018 0904   MONOABS 0.5 03/02/2018 0904   EOSABS 0.2 03/02/2018 0904   BASOSABS 0.0 03/02/2018 0904    ASSESSMENT AND PLAN: 1. Essential hypertension Close to but not at goal.  Continue current dose of Norvasc. Discourage eating country ham as it is very salted  2. Obesity (BMI 30.0-34.9) Encouraged Rachel Hudson to continue regular exercise that she has been doing. Went over and discussed healthy eating habits including healthy snacks.  Rachel Hudson declines referral to nutritionist.  3. Acute vaginitis Advised to discontinue using feminine body wash in the vaginal area as it can cause bacterial vaginosis.  Advised not to douche for the same reason. - fluconazole (DIFLUCAN) 150 MG tablet; Take 1 tablet (150 mg total) by mouth once for 1 dose.  Dispense: 1 tablet; Refill: 0  4. Influenza vaccine needed Given today     Rachel Hudson was given the opportunity to ask questions.  Rachel Hudson verbalized understanding of the plan and was able to repeat key elements of the plan.   No orders of the defined types were placed in this encounter.    Requested Prescriptions   Signed Prescriptions Disp Refills  . fluconazole (DIFLUCAN) 150 MG tablet 1 tablet 0    Sig: Take 1 tablet (150 mg total) by mouth once for 1 dose.    Return in about 4 months (around 06/28/2020).  Jonah Blueeborah Elfrida Pixley, MD, FACP

## 2020-02-26 NOTE — Patient Instructions (Addendum)
Obesity, Adult Obesity is having too much body fat. Being obese means that your weight is more than what is healthy for you. BMI is a number that explains how much body fat you have. If you have a BMI of 30 or more, you are obese. Obesity is often caused by eating or drinking more calories than your body uses. Changing your lifestyle can help you lose weight. Obesity can cause serious health problems, such as:  Stroke.  Coronary artery disease (CAD).  Type 2 diabetes.  Some types of cancer, including cancers of the colon, breast, uterus, and gallbladder.  Osteoarthritis.  High blood pressure (hypertension).  High cholesterol.  Sleep apnea.  Gallbladder stones.  Infertility problems. What are the causes?  Eating meals each day that are high in calories, sugar, and fat.  Being born with genes that may make you more likely to become obese.  Having a medical condition that causes obesity.  Taking certain medicines.  Sitting a lot (having a sedentary lifestyle).  Not getting enough sleep.  Drinking a lot of drinks that have sugar in them. What increases the risk?  Having a family history of obesity.  Being an African American woman.  Being a Hispanic man.  Living in an area with limited access to: ? Parks, recreation centers, or sidewalks. ? Healthy food choices, such as grocery stores and farmers' markets. What are the signs or symptoms? The main sign is having too much body fat. How is this treated?  Treatment for this condition often includes changing your lifestyle. Treatment may include: ? Changing your diet. This may include making a healthy meal plan. ? Exercise. This may include activity that causes your heart to beat faster (aerobic exercise) and strength training. Work with your doctor to design a program that works for you. ? Medicine to help you lose weight. This may be used if you are not able to lose 1 pound a week after 6 weeks of healthy eating and  more exercise. ? Treating conditions that cause the obesity. ? Surgery. Options may include gastric banding and gastric bypass. This may be done if:  Other treatments have not helped to improve your condition.  You have a BMI of 40 or higher.  You have life-threatening health problems related to obesity. Follow these instructions at home: Eating and drinking   Follow advice from your doctor about what to eat and drink. Your doctor may tell you to: ? Limit fast food, sweets, and processed snack foods. ? Choose low-fat options. For example, choose low-fat milk instead of whole milk. ? Eat 5 or more servings of fruits or vegetables each day. ? Eat at home more often. This gives you more control over what you eat. ? Choose healthy foods when you eat out. ? Learn to read food labels. This will help you learn how much food is in 1 serving. ? Keep low-fat snacks available. ? Avoid drinks that have a lot of sugar in them. These include soda, fruit juice, iced tea with sugar, and flavored milk.  Drink enough water to keep your pee (urine) pale yellow.  Do not go on fad diets. Physical activity  Exercise often, as told by your doctor. Most adults should get up to 150 minutes of moderate-intensity exercise every week.Ask your doctor: ? What types of exercise are safe for you. ? How often you should exercise.  Warm up and stretch before being active.  Do slow stretching after being active (cool down).  Rest between   times of being active. Lifestyle  Work with your doctor and a food expert (dietitian) to set a weight-loss goal that is best for you.  Limit your screen time.  Find ways to reward yourself that do not involve food.  Do not drink alcohol if: ? Your doctor tells you not to drink. ? You are pregnant, may be pregnant, or are planning to become pregnant.  If you drink alcohol: ? Limit how much you use to:  0-1 drink a day for women.  0-2 drinks a day for men. ? Be  aware of how much alcohol is in your drink. In the [place] one drink equals one 12 oz bottle of beer (355 mL), one 5 oz glass of wine (148 mL), or one 1 oz glass of hard liquor (44 mL). General instructions  Keep a weight-loss journal. This can help you keep track of: ? The food that you eat. ? How much exercise you get.  Take over-the-counter and prescription medicines only as told by your doctor.  Take vitamins and supplements only as told by your doctor.  Think about joining a support group.  Keep all follow-up visits as told by your doctor. This is important. Contact a doctor if:  You cannot meet your weight loss goal after you have changed your diet and lifestyle for 6 weeks. Get help right away if you:  Are having trouble breathing.  Are having thoughts of harming yourself. Summary  Obesity is having too much body fat.  Being obese means that your weight is more than what is healthy for you.  Work with your doctor to set a weight-loss goal.  Get regular exercise as told by your doctor. This information is not intended to replace advice given to you by your health care provider. Make sure you discuss any questions you have with your health care provider. Document Revised: 01/12/2018 Document Reviewed: 01/12/2018 Elsevier Patient Education  2020 Elsevier Inc.   Influenza Virus Vaccine injection (Fluarix) What is this medicine? INFLUENZA VIRUS VACCINE (in floo EN zuh VAHY ruhs vak SEEN) helps to reduce the risk of getting influenza also known as the flu. This medicine may be used for other purposes; ask your health care provider or pharmacist if you have questions. COMMON BRAND NAME(S): Fluarix, Fluzone What should I tell my health care provider before I take this medicine? They need to know if you have any of these conditions:  bleeding disorder like hemophilia  fever or infection  Guillain-Barre syndrome or other neurological problems  immune system  problems  infection with the human immunodeficiency virus (HIV) or AIDS  low blood platelet counts  multiple sclerosis  an unusual or allergic reaction to influenza virus vaccine, eggs, chicken proteins, latex, gentamicin, other medicines, foods, dyes or preservatives  pregnant or trying to get pregnant  breast-feeding How should I use this medicine? This vaccine is for injection into a muscle. It is given by a health care professional. A copy of Vaccine Information Statements will be given before each vaccination. Read this sheet carefully each time. The sheet may change frequently. Talk to your pediatrician regarding the use of this medicine in children. Special care may be needed. Overdosage: If you think you have taken too much of this medicine contact a poison control center or emergency room at once. NOTE: This medicine is only for you. Do not share this medicine with others. What if I miss a dose? This does not apply. What may interact with this medicine?  or radiation therapy  medicines that lower your immune system like etanercept, anakinra, infliximab, and adalimumab  medicines that treat or prevent blood clots like warfarin  phenytoin  steroid medicines like prednisone or cortisone  theophylline  vaccines This list may not describe all possible interactions. Give your health care provider a list of all the medicines, herbs, non-prescription drugs, or dietary supplements you use. Also tell them if you smoke, drink alcohol, or use illegal drugs. Some items may interact with your medicine. What should I watch for while using this medicine? Report any side effects that do not go away within 3 days to your doctor or health care professional. Call your health care provider if any unusual symptoms occur within 6 weeks of receiving this vaccine. You may still catch the flu, but the illness is not usually as bad. You cannot get the flu from the vaccine. The  vaccine will not protect against colds or other illnesses that may cause fever. The vaccine is needed every year. What side effects may I notice from receiving this medicine? Side effects that you should report to your doctor or health care professional as soon as possible:  allergic reactions like skin rash, itching or hives, swelling of the face, lips, or tongue Side effects that usually do not require medical attention (report to your doctor or health care professional if they continue or are bothersome):  fever  headache  muscle aches and pains  pain, tenderness, redness, or swelling at site where injected  weak or tired This list may not describe all possible side effects. Call your doctor for medical advice about side effects. You may report side effects to FDA at 1-800-FDA-1088. Where should I keep my medicine? This vaccine is only given in a clinic, pharmacy, doctor's office, or other health care setting and will not be stored at home. NOTE: This sheet is a summary. It may not cover all possible information. If you have questions about this medicine, talk to your doctor, pharmacist, or health care provider.  2020 Elsevier/Gold Standard (2007-12-06 09:30:40)  

## 2020-03-03 DIAGNOSIS — Z79899 Other long term (current) drug therapy: Secondary | ICD-10-CM | POA: Diagnosis not present

## 2020-03-10 ENCOUNTER — Other Ambulatory Visit: Payer: Self-pay | Admitting: Internal Medicine

## 2020-03-10 DIAGNOSIS — M47816 Spondylosis without myelopathy or radiculopathy, lumbar region: Secondary | ICD-10-CM

## 2020-03-10 DIAGNOSIS — G5603 Carpal tunnel syndrome, bilateral upper limbs: Secondary | ICD-10-CM

## 2020-04-02 DIAGNOSIS — Z79899 Other long term (current) drug therapy: Secondary | ICD-10-CM | POA: Diagnosis not present

## 2020-04-11 DIAGNOSIS — M19042 Primary osteoarthritis, left hand: Secondary | ICD-10-CM | POA: Diagnosis not present

## 2020-04-11 DIAGNOSIS — M19041 Primary osteoarthritis, right hand: Secondary | ICD-10-CM | POA: Diagnosis not present

## 2020-04-15 ENCOUNTER — Other Ambulatory Visit: Payer: Self-pay

## 2020-04-15 ENCOUNTER — Encounter (HOSPITAL_BASED_OUTPATIENT_CLINIC_OR_DEPARTMENT_OTHER): Payer: Self-pay | Admitting: Orthopedic Surgery

## 2020-04-21 ENCOUNTER — Other Ambulatory Visit (HOSPITAL_COMMUNITY)
Admission: RE | Admit: 2020-04-21 | Discharge: 2020-04-21 | Disposition: A | Discharge status: Home / Self Care | Payer: Medicaid Other | Source: Ambulatory Visit | Attending: Orthopedic Surgery | Admitting: Orthopedic Surgery

## 2020-04-21 DIAGNOSIS — Z20822 Contact with and (suspected) exposure to covid-19: Secondary | ICD-10-CM | POA: Diagnosis not present

## 2020-04-21 DIAGNOSIS — Z01812 Encounter for preprocedural laboratory examination: Secondary | ICD-10-CM | POA: Diagnosis not present

## 2020-04-21 LAB — SARS CORONAVIRUS 2 (TAT 6-24 HRS): SARS Coronavirus 2: NEGATIVE

## 2020-04-23 NOTE — Progress Notes (Signed)
Spoke with patient about coming in today for preop EKG and presurgery drink as ordered. Patient states she is waiting on Dr Merrilee Seashore office to notify her of authorization for surgery. Instructed patient to drink measured 10 oz water at 6a as substitute for presurgery drink if she is planning to proceed with surgery tomorrow, and to arrive at [facility] at 7:45a to allow time to complete EKG. Patient verbalized understanding.

## 2020-04-24 ENCOUNTER — Ambulatory Visit (HOSPITAL_BASED_OUTPATIENT_CLINIC_OR_DEPARTMENT_OTHER): Payer: Medicaid Other | Admitting: Anesthesiology

## 2020-04-24 ENCOUNTER — Encounter (HOSPITAL_BASED_OUTPATIENT_CLINIC_OR_DEPARTMENT_OTHER): Payer: Self-pay | Admitting: Orthopedic Surgery

## 2020-04-24 ENCOUNTER — Other Ambulatory Visit: Payer: Self-pay

## 2020-04-24 ENCOUNTER — Ambulatory Visit (HOSPITAL_BASED_OUTPATIENT_CLINIC_OR_DEPARTMENT_OTHER)
Admission: RE | Admit: 2020-04-24 | Discharge: 2020-04-24 | Disposition: A | Discharge status: Home / Self Care | Payer: Medicaid Other | Attending: Orthopedic Surgery | Admitting: Orthopedic Surgery

## 2020-04-24 ENCOUNTER — Encounter (HOSPITAL_BASED_OUTPATIENT_CLINIC_OR_DEPARTMENT_OTHER)
Admission: RE | Disposition: A | Discharge status: Home / Self Care | Payer: Self-pay | Source: Home / Self Care | Attending: Orthopedic Surgery

## 2020-04-24 DIAGNOSIS — I10 Essential (primary) hypertension: Secondary | ICD-10-CM | POA: Diagnosis not present

## 2020-04-24 DIAGNOSIS — M65331 Trigger finger, right middle finger: Secondary | ICD-10-CM | POA: Insufficient documentation

## 2020-04-24 DIAGNOSIS — Z79899 Other long term (current) drug therapy: Secondary | ICD-10-CM | POA: Diagnosis not present

## 2020-04-24 DIAGNOSIS — Z880 Allergy status to penicillin: Secondary | ICD-10-CM | POA: Diagnosis not present

## 2020-04-24 DIAGNOSIS — Z791 Long term (current) use of non-steroidal anti-inflammatories (NSAID): Secondary | ICD-10-CM | POA: Insufficient documentation

## 2020-04-24 DIAGNOSIS — M19041 Primary osteoarthritis, right hand: Secondary | ICD-10-CM | POA: Diagnosis not present

## 2020-04-24 DIAGNOSIS — M65321 Trigger finger, right index finger: Secondary | ICD-10-CM | POA: Insufficient documentation

## 2020-04-24 DIAGNOSIS — Z886 Allergy status to analgesic agent status: Secondary | ICD-10-CM | POA: Insufficient documentation

## 2020-04-24 DIAGNOSIS — K219 Gastro-esophageal reflux disease without esophagitis: Secondary | ICD-10-CM | POA: Diagnosis not present

## 2020-04-24 HISTORY — PX: FINGER ARTHROSCOPY WITH CARPOMETACARPEL (CMC) ARTHROPLASTY: SHX5629

## 2020-04-24 HISTORY — PX: TRIGGER FINGER RELEASE: SHX641

## 2020-04-24 SURGERY — FINGER ARTHROSCOPY WITH CARPOMETACARPEL (CMC) ARTHROPLASTY
Anesthesia: Regional | Site: Finger | Laterality: Right

## 2020-04-24 MED ORDER — PROPOFOL 500 MG/50ML IV EMUL
INTRAVENOUS | Status: AC
  Filled 2020-04-24: qty 50

## 2020-04-24 MED ORDER — BUPIVACAINE-EPINEPHRINE (PF) 0.5% -1:200000 IJ SOLN
INTRAMUSCULAR | Status: DC | PRN
  Administered 2020-04-24: 30 mL via PERINEURAL

## 2020-04-24 MED ORDER — FENTANYL CITRATE (PF) 100 MCG/2ML IJ SOLN: 100.0000 ug | Freq: Once | INTRAMUSCULAR | Status: DC

## 2020-04-24 MED ORDER — PHENYLEPHRINE 40 MCG/ML (10ML) SYRINGE FOR IV PUSH (FOR BLOOD PRESSURE SUPPORT)
PREFILLED_SYRINGE | INTRAVENOUS | Status: AC
  Filled 2020-04-24: qty 10

## 2020-04-24 MED ORDER — MEPERIDINE HCL 25 MG/ML IJ SOLN: 6.2500 mg | INTRAMUSCULAR | Status: DC | PRN

## 2020-04-24 MED ORDER — FENTANYL CITRATE (PF) 100 MCG/2ML IJ SOLN
INTRAMUSCULAR | Status: AC
  Filled 2020-04-24: qty 2

## 2020-04-24 MED ORDER — VANCOMYCIN HCL IN DEXTROSE 1-5 GM/200ML-% IV SOLN
1000.0000 mg | INTRAVENOUS | Status: AC
  Administered 2020-04-24: 1000 mg via INTRAVENOUS

## 2020-04-24 MED ORDER — ONDANSETRON HCL 4 MG/2ML IJ SOLN: 4.0000 mg | Freq: Once | INTRAMUSCULAR | Status: DC | PRN

## 2020-04-24 MED ORDER — LIDOCAINE HCL (CARDIAC) PF 100 MG/5ML IV SOSY
PREFILLED_SYRINGE | INTRAVENOUS | Status: DC | PRN
  Administered 2020-04-24: 40 mg via INTRAVENOUS

## 2020-04-24 MED ORDER — ONDANSETRON HCL 4 MG/2ML IJ SOLN
INTRAMUSCULAR | Status: DC | PRN
  Administered 2020-04-24: 4 mg via INTRAVENOUS

## 2020-04-24 MED ORDER — OXYCODONE HCL 5 MG PO TABS
ORAL_TABLET | ORAL | 0 refills | Status: DC
Start: 2020-04-24 — End: 2020-10-15

## 2020-04-24 MED ORDER — LACTATED RINGERS IV SOLN: INTRAVENOUS | Status: DC

## 2020-04-24 MED ORDER — MIDAZOLAM HCL 2 MG/2ML IJ SOLN
INTRAMUSCULAR | Status: AC
  Filled 2020-04-24: qty 2

## 2020-04-24 MED ORDER — SUCCINYLCHOLINE CHLORIDE 200 MG/10ML IV SOSY
PREFILLED_SYRINGE | INTRAVENOUS | Status: AC
  Filled 2020-04-24: qty 10

## 2020-04-24 MED ORDER — HYDROMORPHONE HCL 1 MG/ML IJ SOLN: 0.2500 mg | INTRAMUSCULAR | Status: DC | PRN

## 2020-04-24 MED ORDER — EPHEDRINE SULFATE 50 MG/ML IJ SOLN
INTRAMUSCULAR | Status: DC | PRN
  Administered 2020-04-24: 10 mg via INTRAVENOUS

## 2020-04-24 MED ORDER — MIDAZOLAM HCL 2 MG/2ML IJ SOLN
2.0000 mg | Freq: Once | INTRAMUSCULAR | Status: AC
  Administered 2020-04-24: 1 mg via INTRAVENOUS

## 2020-04-24 MED ORDER — ONDANSETRON HCL 4 MG/2ML IJ SOLN
INTRAMUSCULAR | Status: AC
  Filled 2020-04-24: qty 2

## 2020-04-24 MED ORDER — PROPOFOL 500 MG/50ML IV EMUL
INTRAVENOUS | Status: DC | PRN
  Administered 2020-04-24: 100 ug/kg/min via INTRAVENOUS

## 2020-04-24 MED ORDER — LIDOCAINE 2% (20 MG/ML) 5 ML SYRINGE
INTRAMUSCULAR | Status: AC
  Filled 2020-04-24: qty 5

## 2020-04-24 MED ORDER — VANCOMYCIN HCL IN DEXTROSE 1-5 GM/200ML-% IV SOLN
INTRAVENOUS | Status: AC
  Filled 2020-04-24: qty 200

## 2020-04-24 MED ORDER — EPHEDRINE 5 MG/ML INJ
INTRAVENOUS | Status: AC
  Filled 2020-04-24: qty 10

## 2020-04-24 SURGICAL SUPPLY — 55 items
APL PRP STRL LF DISP 70% ISPRP (MISCELLANEOUS) ×1
BLADE MINI RND TIP GREEN BEAV (BLADE) IMPLANT
BLADE OSC/SAG .038X5.5 CUT EDG (BLADE) ×3 IMPLANT
BLADE SURG 15 STRL LF DISP TIS (BLADE) ×2 IMPLANT
BLADE SURG 15 STRL SS (BLADE) ×6
BNDG CMPR 9X4 STRL LF SNTH (GAUZE/BANDAGES/DRESSINGS) ×1
BNDG COHESIVE 2X5 TAN STRL LF (GAUZE/BANDAGES/DRESSINGS) ×3 IMPLANT
BNDG ELASTIC 3X5.8 VLCR STR LF (GAUZE/BANDAGES/DRESSINGS) ×3 IMPLANT
BNDG ESMARK 4X9 LF (GAUZE/BANDAGES/DRESSINGS) ×3 IMPLANT
BNDG GAUZE ELAST 4 BULKY (GAUZE/BANDAGES/DRESSINGS) ×3 IMPLANT
CHLORAPREP W/TINT 26 (MISCELLANEOUS) ×3 IMPLANT
CORD BIPOLAR FORCEPS 12FT (ELECTRODE) ×3 IMPLANT
COVER BACK TABLE 60X90IN (DRAPES) ×3 IMPLANT
COVER MAYO STAND STRL (DRAPES) ×3 IMPLANT
CUFF TOURN SGL QUICK 18X4 (TOURNIQUET CUFF) ×3 IMPLANT
DRAPE EXTREMITY T 121X128X90 (DISPOSABLE) ×3 IMPLANT
DRAPE OEC MINIVIEW 54X84 (DRAPES) ×3 IMPLANT
DRAPE SURG 17X23 STRL (DRAPES) ×3 IMPLANT
GAUZE SPONGE 4X4 12PLY STRL (GAUZE/BANDAGES/DRESSINGS) ×3 IMPLANT
GAUZE XEROFORM 1X8 LF (GAUZE/BANDAGES/DRESSINGS) ×3 IMPLANT
GLOVE BIO SURGEON STRL SZ7.5 (GLOVE) ×3 IMPLANT
GLOVE BIOGEL PI IND STRL 8 (GLOVE) ×1 IMPLANT
GLOVE BIOGEL PI IND STRL 8.5 (GLOVE) ×1 IMPLANT
GLOVE BIOGEL PI INDICATOR 8 (GLOVE) ×2
GLOVE BIOGEL PI INDICATOR 8.5 (GLOVE) ×2
GLOVE SURG ORTHO 8.0 STRL STRW (GLOVE) ×3 IMPLANT
GOWN STRL REUS W/ TWL LRG LVL3 (GOWN DISPOSABLE) ×1 IMPLANT
GOWN STRL REUS W/TWL LRG LVL3 (GOWN DISPOSABLE) ×3
GOWN STRL REUS W/TWL XL LVL3 (GOWN DISPOSABLE) ×6 IMPLANT
IMPL SILICONE MCP SZ20 (Joint) ×1 IMPLANT
IMPLANT SILICONE MCP SZ20 (Joint) ×3 IMPLANT
K-WIRE .035X4 (WIRE) ×3 IMPLANT
K-WIRE .062X4 (WIRE) ×3 IMPLANT
NS IRRIG 1000ML POUR BTL (IV SOLUTION) ×3 IMPLANT
PACK BASIN DAY SURGERY FS (CUSTOM PROCEDURE TRAY) ×3 IMPLANT
PACK DISP ASCENSION ×3 IMPLANT
PAD CAST 3X4 CTTN HI CHSV (CAST SUPPLIES) ×1 IMPLANT
PADDING CAST ABS 4INX4YD NS (CAST SUPPLIES) ×2
PADDING CAST ABS COTTON 4X4 ST (CAST SUPPLIES) ×1 IMPLANT
PADDING CAST COTTON 3X4 STRL (CAST SUPPLIES) ×3
SLEEVE SCD COMPRESS KNEE MED (MISCELLANEOUS) ×3 IMPLANT
SLING ARM FOAM STRAP LRG (SOFTGOODS) ×3 IMPLANT
SPLINT PLASTER CAST XFAST 3X15 (CAST SUPPLIES) IMPLANT
SPLINT PLASTER XTRA FASTSET 3X (CAST SUPPLIES)
STOCKINETTE 4X48 STRL (DRAPES) ×3 IMPLANT
SUT CHROMIC 4 0 P 3 18 (SUTURE) ×3 IMPLANT
SUT ETHILON 4 0 PS 2 18 (SUTURE) ×3 IMPLANT
SUT FIBERWIRE 4-0 18 TAPR NDL (SUTURE) ×3
SUT MERSILENE 4 0 P 3 (SUTURE) IMPLANT
SUT SILK 4 0 PS 2 (SUTURE) IMPLANT
SUT VICRYL 4-0 PS2 18IN ABS (SUTURE) IMPLANT
SUTURE FIBERWR 4-0 18 TAPR NDL (SUTURE) ×1 IMPLANT
SYR BULB EAR ULCER 3OZ GRN STR (SYRINGE) ×3 IMPLANT
TOWEL GREEN STERILE FF (TOWEL DISPOSABLE) ×6 IMPLANT
UNDERPAD 30X36 HEAVY ABSORB (UNDERPADS AND DIAPERS) ×3 IMPLANT

## 2020-04-24 NOTE — Anesthesia Postprocedure Evaluation (Signed)
Anesthesia Post Note  Patient: Rachel Hudson  Procedure(s) Performed: RIGHT INDEX FINGER METACARPAL PHALANGEAL ARTHROPLASTY (Right Finger) RIGHT LONG TRIGGER FINGER RELEASE TRIGGER FINGER/A-1 PULLEY (Right Finger)     Patient location during evaluation: PACU Anesthesia Type: Regional Level of consciousness: awake and alert and patient cooperative Pain management: pain level controlled Vital Signs Assessment: post-procedure vital signs reviewed and stable Respiratory status: spontaneous breathing and respiratory function stable Cardiovascular status: stable Anesthetic complications: no   No complications documented.  Last Vitals:  Vitals:   04/24/20 1200 04/24/20 1210  BP: 134/76 (!) 155/83  Pulse: (!) 59 63  Resp: 17 16  Temp:  36.5 C  SpO2: 96% 100%    Last Pain:  Vitals:   04/24/20 1210  TempSrc:   PainSc: 0-No pain                 Adeline Petitfrere DAVID

## 2020-04-24 NOTE — Anesthesia Procedure Notes (Signed)
Anesthesia Regional Block: Supraclavicular block   Pre-Anesthetic Checklist: ,, timeout performed, Correct Patient, Correct Site, Correct Laterality, Correct Procedure, Correct Position, site marked, Risks and benefits discussed,  Surgical consent,  Pre-op evaluation,  At surgeon's request and post-op pain management  Laterality: Right  Prep: chloraprep       Needles:   Needle Type: Echogenic Stimulator Needle     Needle Length: 9cm  Needle Gauge: 21     Additional Needles:   Procedures:, nerve stimulator,,,,,,,   Nerve Stimulator or Paresthesia:  Response: 0.4 mA,   Additional Responses:   Narrative:  Start time: 04/24/2020 9:22 AM End time: 04/24/2020 9:32 AM Injection made incrementally with aspirations every 5 mL.  Performed by: Personally  Anesthesiologist: Arta Bruce, MD  Additional Notes: Monitors applied. Patient sedated. Sterile prep and drape,hand hygiene and sterile gloves were used. Relevant anatomy identified.Needle position confirmed.Local anesthetic injected incrementally after negative aspiration. Local anesthetic spread visualized around nerve(s). Vascular puncture avoided. No complications. Image printed for medical record.The patient tolerated the procedure well.

## 2020-04-24 NOTE — Discharge Instructions (Addendum)
° °  ° ° ° °[facility] Instructions °Hand Surgery ° °Wound Care: °Keep your hand elevated above the level of your heart.  Do not allow it to dangle by your side.  Keep the dressing dry and do not remove it unless your doctor advises you to do so.  He will usually change it at the time of your post-op visit.  Moving your fingers is advised to stimulate circulation but will depend on the site of your surgery.  If you have a splint applied, your doctor will advise you regarding movement. ° °Activity: °Do not drive or operate machinery today.  Rest today and then you may return to your normal activity and work as indicated by your physician. ° °Diet:  °Drink liquids today or eat a light diet.  You may resume a regular diet tomorrow.   ° °General expectations: °Pain for two to three days. °Fingers may become slightly swollen. ° °Call your doctor if any of the following occur: °Severe pain not relieved by pain medication. °Elevated temperature. °Dressing soaked with blood. °Inability to move fingers. °White or bluish color to fingers. ° ° °Regional Anesthesia Blocks ° °1. Numbness or the inability to move the "blocked" extremity may last from 3-48 hours after placement. The length of time depends on the medication injected and your individual response to the medication. If the numbness is not going away after 48 hours, call your surgeon. ° °2. The extremity that is blocked will need to be protected until the numbness is gone and the  Strength has returned. Because you cannot feel it, you will need to take extra care to avoid injury. Because it may be weak, you may have difficulty moving it or using it. You may not know what position it is in without looking at it while the block is in effect. ° °3. For blocks in the legs and feet, returning to weight bearing and walking needs to be done carefully. You will need to wait until the numbness is entirely gone and the strength has returned. You should be able to move your leg  and foot normally before you try and bear weight or walk. You will need someone to be with you when you first try to ensure you do not fall and possibly risk injury. ° °4. Bruising and tenderness at the needle site are common side effects and will resolve in a few days. ° °5. Persistent numbness or new problems with movement should be communicated to the surgeon or the [facility] ([phone])/ [facility] ([phone]). ° ° ° °Post Anesthesia Home Care Instructions ° °Activity: °Get plenty of rest for the remainder of the day. A responsible individual must stay with you for 24 hours following the procedure.  °For the next 24 hours, DO NOT: °-Drive a car °-Operate machinery °-Drink alcoholic beverages °-Take any medication unless instructed by your physician °-Make any legal decisions or sign important papers. ° °Meals: °Start with liquid foods such as gelatin or soup. Progress to regular foods as tolerated. Avoid greasy, spicy, heavy foods. If nausea and/or vomiting occur, drink only clear liquids until the nausea and/or vomiting subsides. Call your physician if vomiting continues. ° °Special Instructions/Symptoms: °Your throat may feel dry or sore from the anesthesia or the breathing tube placed in your throat during surgery. If this causes discomfort, gargle with warm salt water. The discomfort should disappear within 24 hours. ° °If you had a scopolamine patch placed behind your ear for   the management of post- operative nausea and/or vomiting: ° °1. The medication in the patch is effective for 72 hours, after which it should be removed.  Wrap patch in a tissue and discard in the trash. Wash hands thoroughly with soap and water. °2. You may remove the patch earlier than 72 hours if you experience unpleasant side effects which may include dry mouth, dizziness or visual disturbances. °3. Avoid touching the patch. Wash your hands with soap and water after contact with the patch. °  ° ° °

## 2020-04-24 NOTE — H&P (Signed)
Rachel Hudson is an 59 y.o. female.   Chief Complaint: right index mp arhritis HPI: 59 yo female with pain in right index finger mp joint and triggering right long finger.  She wishes to proceed with right index finger mp joint arthroplasty and right long finger trigger release.  Allergies:  Allergies  Allergen Reactions   Penicillins     Facial swelling Has patient had a PCN reaction causing immediate rash, facial/tongue/throat swelling, SOB or lightheadedness with hypotension: Yes Has patient had a PCN reaction causing severe rash involving mucus membranes or skin necrosis: No Has patient had a PCN reaction that required hospitalization No Has patient had a PCN reaction occurring within the last 10 years: Yes If all of the above answers are "NO", then may proceed with Cephalosporin use.    Tylenol [Acetaminophen] Other (See Comments)    HX of Hep. 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 4   GERD (gastroesophageal reflux disease)    Gout    Hep C w/o coma, chronic (HCC) As of 10/22/13   Hypertension     Past Surgical History:  Procedure Laterality Date   BACK SURGERY     lumbar fusion   BIOPSY  09/12/2017   Procedure: BIOPSY;  Surgeon: Corbin Ade, MD;  [facility];  Service: Endoscopy;;  gastric   CARPAL TUNNEL RELEASE Right 11/13/2015   Procedure: RIGHT CARPAL TUNNEL RELEASE;  Surgeon: Betha Loa, MD;  Location: [facility];  Service: Orthopedics;  Laterality: Right;   CARPAL TUNNEL RELEASE Left 01/29/2016   Procedure: left CARPAL TUNNEL RELEASE;  Surgeon: Betha Loa, MD;  Location: [facility];  Service: Orthopedics;  Laterality: Left;   COLONOSCOPY N/A 07/08/2015   Procedure: COLONOSCOPY;  Surgeon: Corbin Ade, MD;  [facility];  Service: Endoscopy;  Laterality: N/A;  230   ESOPHAGOGASTRODUODENOSCOPY N/A 07/08/2015    Procedure: ESOPHAGOGASTRODUODENOSCOPY (EGD);  Surgeon: Corbin Ade, MD;  [facility];  Service: Endoscopy;  Laterality: N/A;   ESOPHAGOGASTRODUODENOSCOPY (EGD) WITH PROPOFOL N/A 09/12/2017   Procedure: ESOPHAGOGASTRODUODENOSCOPY (EGD) WITH PROPOFOL;  Surgeon: Corbin Ade, MD;  [facility];  Service: Endoscopy;  Laterality: N/A;  9:30am   ESOPHAGOGASTRODUODENOSCOPY (EGD) WITH PROPOFOL N/A 10/25/2019   Procedure: ESOPHAGOGASTRODUODENOSCOPY (EGD) WITH PROPOFOL;  Surgeon: Corbin Ade, MD;  [facility];  Service: Endoscopy;  Laterality: N/A;  10:30am   LIGAMENT REPAIR Right 1980   Knee   MALONEY DILATION N/A 09/12/2017   Procedure: Elease Hashimoto DILATION;  Surgeon: Corbin Ade, MD;  [facility];  Service: Endoscopy;  Laterality: N/A;   MALONEY DILATION N/A 10/25/2019   Procedure: Elease Hashimoto DILATION;  Surgeon: Corbin Ade, MD;  [facility];  Service: Endoscopy;  Laterality: N/A;    Family History: Family History  Adopted: Yes  Problem Relation Age of Onset   Colon cancer Neg Hx     Social History:   reports that she has never smoked. She has never used smokeless tobacco. She reports current alcohol use. She reports current drug use. Frequency: 7.00 times per week. Drug: Marijuana.  Medications: Medications Prior to Admission  Medication Sig Dispense Refill   amLODipine (NORVASC) 10 MG tablet Take 1 tablet (10 mg total) by mouth daily. Must keep upcoming office visit for refills 30 tablet 6  celecoxib (CELEBREX) 200 MG capsule Take 1 capsule (200 mg total) by mouth daily. 30 capsule 6   COLLAGEN PO Take 3 g by mouth daily.     dexlansoprazole (DEXILANT) 60 MG capsule Take 1 capsule (60 mg total) by mouth daily. 30 capsule 11   Flax Oil-Fish Oil-Borage Oil (FISH-FLAX-BORAGE PO) Take 1 capsule by mouth daily.     gabapentin (NEURONTIN) 800 MG tablet TAKE 1 & 1/2 (ONE & ONE-HALF) TABLETS BY MOUTH THREE TIMES DAILY 135  tablet 2   Lecith-Inosi-Chol-B12-Liver (LIVERITE) 1.125 MCG TABS Take 1 tablet by mouth 2 (two) times daily. LIVER AID     milk thistle 175 MG tablet Take 175 mg by mouth daily.     MITIGARE 0.6 MG CAPS Take 1 capsule by mouth 2 (two) times daily. 60 capsule 6   Multiple Vitamin (MULTIVITAMIN WITH MINERALS) TABS tablet Take 1 tablet by mouth daily.     oxyCODONE-acetaminophen (PERCOCET) 10-325 MG tablet Take 1 tablet by mouth 2 (two) times daily as needed for pain.     Tetrahydrozoline HCl (VISINE OP) Place 1 drop into both eyes daily as needed (allergies).      No results found for this or any previous visit (from the past 48 hour(s)).  No results found.   A comprehensive review of systems was negative.  Blood pressure 127/66, pulse (!) 52, temperature 98.1 F (36.7 C), temperature source Oral, resp. rate 17, height 5\' 1"  (1.549 m), weight 79.5 kg, last menstrual period 09/29/2016, SpO2 100 %.  General appearance: alert, cooperative and appears stated age Head: Normocephalic, without obvious abnormality, atraumatic Neck: supple, symmetrical, trachea midline Cardio: regular rate and rhythm Resp: clear to auscultation bilaterally Extremities: Intact sensation and capillary refill all digits.  +epl/fpl/io.  No wounds.  Pulses: 2+ and symmetric Skin: Skin color, texture, turgor normal. 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  She wishes to proceed with right index finger mp joint arthroplasty and right long finger trigger release.  Non operative and operative treatment options have been discussed with the patient and patient wishes to proceed with operative treatment. Risks, benefits, and alternatives of surgery have been discussed and the patient agrees with the plan of care.   11/29/2016 04/24/2020, 9:45 AM

## 2020-04-24 NOTE — Transfer of Care (Signed)
Immediate Anesthesia Transfer of Care Note  Patient: Rachel Hudson  Procedure(s) Performed: RIGHT INDEX FINGER METACARPAL PHALANGEAL ARTHROPLASTY (Right Finger) RIGHT LONG TRIGGER FINGER RELEASE TRIGGER FINGER/A-1 PULLEY (Right Finger)  Patient Location: PACU  Anesthesia Type:MAC combined with regional for post-op pain  Level of Consciousness: awake, alert , oriented and drowsy  Airway & Oxygen Therapy: Patient Spontanous Breathing and Patient connected to face mask oxygen  Post-op Assessment: Report given to RN and Post -op Vital signs reviewed and stable  Post vital signs: Reviewed and stable  Last Vitals:  Vitals Value Taken Time  BP    Temp    Pulse    Resp    SpO2      Last Pain:  Vitals:   04/24/20 0827  TempSrc: Oral  PainSc: 7       Patients Stated Pain Goal: 3 (02/01/67 1661)  Complications: No complications documented.

## 2020-04-24 NOTE — Op Note (Signed)
I assisted Surgeon(s) and Role:    * Betha Loa, MD - Primary    * Cindee Salt, MD on the Procedure(s): RIGHT INDEX FINGER METACARPAL PHALANGEAL ARTHROPLASTY RIGHT LONG TRIGGER FINGER RELEASE TRIGGER FINGER/A-1 PULLEY on 04/24/2020.  I provided assistance on this case as follows: Set up, approach, release of the A1 pulley of the middle finger followed by approach to the metacarpal phalangeal joint arthritis of the index finger identification of the arthritis with removal of osteophytes preparation of the proximal phalanx and the metacarpal for application of prosthetic replacement using silicone prosthesis, placement of the prosthesis, repair of radial collateral ligament, closure of the capsule, repair the extensor tendon hood, closure of the wound application of dressing and splint  Electronically signed by: Cindee Salt, MD Date: 04/24/2020 Time: 11:39 AM

## 2020-04-24 NOTE — Progress Notes (Signed)
Assisted Dr. Ossey with left, ultrasound guided, supraclavicular block. Side rails up, monitors on throughout procedure. See vital signs in flow sheet. Tolerated Procedure well. 

## 2020-04-24 NOTE — Anesthesia Preprocedure Evaluation (Signed)
Anesthesia Evaluation  Patient identified by MRN, date of birth, ID band Patient awake    Reviewed: Allergy & Precautions, NPO status , Patient's Chart, lab work & pertinent test results  Airway Mallampati: I  TM Distance: >3 FB Neck ROM: Full    Dental   Pulmonary    Pulmonary exam normal        Cardiovascular hypertension, Pt. on medications Normal cardiovascular exam     Neuro/Psych Anxiety Bipolar Disorder    GI/Hepatic GERD  Medicated and Controlled,(+) Hepatitis -, C  Endo/Other    Renal/GU      Musculoskeletal   Abdominal   Peds  Hematology   Anesthesia Other Findings   Reproductive/Obstetrics                             Anesthesia Physical Anesthesia Plan  ASA: II  Anesthesia Plan: Regional   Post-op Pain Management:    Induction: Intravenous  PONV Risk Score and Plan: 2  Airway Management Planned: Nasal Cannula  Additional Equipment:   Intra-op Plan:   Post-operative Plan:   Informed Consent: I have reviewed the patients History and Physical, chart, labs and discussed the procedure including the risks, benefits and alternatives for the proposed anesthesia with the patient or authorized representative who has indicated his/her understanding and acceptance.       Plan Discussed with: CRNA and Surgeon  Anesthesia Plan Comments:         Anesthesia Quick Evaluation

## 2020-04-24 NOTE — Op Note (Addendum)
NAME: Rachel Hudson MEDICAL RECORD NO: [id number] DATE OF BIRTH: [birth year] FACILITY: [facility] LOCATION: [facility] PHYSICIAN: Tami Ribas, MD   OPERATIVE REPORT   DATE OF PROCEDURE: 04/24/20    PREOPERATIVE DIAGNOSIS:   1.  Right index finger MP joint arthritis 2.  Right long finger trigger digit   POSTOPERATIVE DIAGNOSIS:   1.  Right index finger MP joint arthritis 2.  Right long finger trigger digit   PROCEDURE:   1.  Right index finger MP joint silicone arthroplasty 2.  Right long finger trigger release   SURGEON:  Betha Loa, M.D.   ASSISTANT: Cindee Salt, MD   ANESTHESIA:  Regional with sedation   INTRAVENOUS FLUIDS:  Per anesthesia flow sheet.   ESTIMATED BLOOD LOSS:  Minimal.   COMPLICATIONS:  None.   SPECIMENS:  none   TOURNIQUET TIME:    Total Tourniquet Time Documented: Upper Arm (Right) - 80 minutes Total: Upper Arm (Right) - 80 minutes    DISPOSITION:  Stable to PACU.   INDICATIONS: 59 year old female with pain of right index finger MP joint.  Degenerative arthritic changes of the MP joint by radiographs.  She wishes to have right index finger MP joint arthroplasty and release of right long finger trigger digit. Risks, benefits and alternatives of surgery were discussed including the risks of blood loss, infection, damage to nerves, vessels, tendons, ligaments, bone for surgery, need for additional surgery, complications with wound healing, continued pain, stiffness.  She voiced understanding of these risks and elected to proceed.  OPERATIVE COURSE:  After being identified preoperatively by myself,  the patient and I agreed on the procedure and site of the procedure.  The surgical site was marked.  Surgical consent had been signed. She was given IV antibiotics as preoperative antibiotic prophylaxis. She was transferred to the operating room and placed on the operating table in supine position with the Right Right upper extremity on an  arm board.  Sedation was induced by the anesthesiologist. A regional block had been performed by anesthesia in preoperative holding.   Right upper extremity was prepped and draped in normal sterile orthopedic fashion.  A surgical pause was performed between the surgeons, anesthesia, and operating room staff and all were in agreement as to the patient, procedure, and site of procedure.  Tourniquet at the proximal aspect of the extremity was inflated to 250 mmHg after exsanguination of the arm with an Esmarch bandage.    Incision was made at the volar aspect the MP joint of the right long finger.  This is carried into subcutaneous tissues by spreading technique.  The A1 pulley was identified and sharply incised.  The proximal 1 to 2 mm of the A2 pulley were incised as well to provide better excursion of the tendons.  The finger was placed range of motion there was no triggering.  The tendons were brought through the wound separated.  The wound was copiously irrigated with sterile saline and closed with 4-0 nylon in a horizontal mattress fashion.  Incision was then made on the dorsum of the index finger over the MP joint.  This is carried in subcutaneous tissues by spreading technique.  Bipolar electrocautery was used to obtain hemostasis.  The ulnar sagittal bands were released the extensor tendon.  The tendon was retracted radially.  The capsule was sharply incised and elevated with a knife.  The collateral ligaments were released from the metacarpal head on both the radial and ulnar sides.  There was  a large osteophyte ulnarly which was removed with rongeurs.  There was eburnation of the metacarpal head as well as the articular surface at the base of the proximal phalanx.  The guide from the Integra silicone arthroplasty set was placed on the metacarpal.  C-arm was used in AP and objections to ensure appropriate positioning.  The cut of the metacarpal head was made.  A 0.062 K wire was then placed into the index  finger proximal phalanx through the joint.  C-arm was used in AP and lateral projections to ensure appropriate position which was the case.  The awl was then used to open up the subchondral bone which was very thickened.  A bur was used to open up the subchondral bone as the broach could not be placed down on its own.  The proximal phalanx was then broached sequentially from size 5 to 10 then to 20.  This appeared to be a good-sized broach.  The C-arm was used in AP and lateral projections to ensure appropriate position which was the case.  The metacarpal was then broached from 5 to 10 then to 20.  The joint was copiously irrigated with sterile saline.  The trial prosthesis was placed.  There was good positioning of the finger.  Wrist was placed through tenodesis.  The index finger carotid toward the long finger slightly but overall was in good position.  There was prominence of the bone at the radial side at the joint which pushed the finger ulnarly.  It was felt that removal of any of this bone would compromise the collateral ligament.  The trial prosthesis was removed.  The joint was copiously irrigated with sterile saline.  The drill holes for the radial collateral ligament repair were made with a 0.035 inch K wire.  4-0 FiberWire suture was then used to pass through these holes and and through the radial collateral ligament to be later tied down for the repair.  The size 20 silicone arthroplasty was then placed into the metacarpal and proximal phalanx using a no touch technique.  C-arm was used in AP and lateral projections to ensure appropriate position of the arthroplasty which was the case.  The suture from the collateral ligament repair was then tied.  The capsule was repaired over top of the joint with 4-0 chromic suture in a running fashion.  The ulnar sagittal bands that have been recently started the case were repaired with a 4-0 M[place]ilene in a running fashion.  The skin was closed with 4-0 nylon in a  horizontal mattress fashion.  There was good position of the finger.  The wounds were dressed with sterile Xeroform 4 x 4's and wrapped with a Kerlix bandage.  Volar splint was placed including the index long ring and small fingers.  This was wrapped with Kerlix and Ace bandage.  The tourniquet was deflated at 80 minutes.  Fingertips were pink with brisk capillary refill after deflation of tourniquet.  The operative  drapes were broken down.  The patient was awoken from anesthesia safely.  She was transferred back to the stretcher and taken to PACU in stable condition.  I will see her back in the office in 1 week for postoperative followup.  I will give her a prescription for Oxycodone 5 mg 1-2 p.o. every 6 hours as needed pain dispense #20.Marland Kitchen   Betha Loa, MD Electronically signed, 04/24/20

## 2020-04-28 ENCOUNTER — Encounter (HOSPITAL_BASED_OUTPATIENT_CLINIC_OR_DEPARTMENT_OTHER): Payer: Self-pay | Admitting: Orthopedic Surgery

## 2020-04-30 DIAGNOSIS — Z471 Aftercare following joint replacement surgery: Secondary | ICD-10-CM | POA: Diagnosis not present

## 2020-04-30 DIAGNOSIS — Z96691 Finger-joint replacement of right hand: Secondary | ICD-10-CM | POA: Diagnosis not present

## 2020-04-30 DIAGNOSIS — M19041 Primary osteoarthritis, right hand: Secondary | ICD-10-CM | POA: Diagnosis not present

## 2020-04-30 DIAGNOSIS — M79644 Pain in right finger(s): Secondary | ICD-10-CM | POA: Diagnosis not present

## 2020-04-30 DIAGNOSIS — M25641 Stiffness of right hand, not elsewhere classified: Secondary | ICD-10-CM | POA: Diagnosis not present

## 2020-04-30 DIAGNOSIS — M19042 Primary osteoarthritis, left hand: Secondary | ICD-10-CM | POA: Diagnosis not present

## 2020-04-30 DIAGNOSIS — Z4789 Encounter for other orthopedic aftercare: Secondary | ICD-10-CM | POA: Diagnosis not present

## 2020-05-02 DIAGNOSIS — M130 Polyarthritis, unspecified: Secondary | ICD-10-CM | POA: Diagnosis not present

## 2020-05-02 DIAGNOSIS — G8918 Other acute postprocedural pain: Secondary | ICD-10-CM | POA: Diagnosis not present

## 2020-05-02 DIAGNOSIS — M542 Cervicalgia: Secondary | ICD-10-CM | POA: Diagnosis not present

## 2020-05-02 DIAGNOSIS — Z79899 Other long term (current) drug therapy: Secondary | ICD-10-CM | POA: Diagnosis not present

## 2020-05-02 DIAGNOSIS — Z9889 Other specified postprocedural states: Secondary | ICD-10-CM | POA: Diagnosis not present

## 2020-05-07 DIAGNOSIS — M65332 Trigger finger, left middle finger: Secondary | ICD-10-CM | POA: Diagnosis not present

## 2020-05-08 ENCOUNTER — Ambulatory Visit: Payer: Medicaid Other | Admitting: Nurse Practitioner

## 2020-05-08 ENCOUNTER — Other Ambulatory Visit: Payer: Self-pay

## 2020-05-08 ENCOUNTER — Encounter: Payer: Self-pay | Admitting: Nurse Practitioner

## 2020-05-08 ENCOUNTER — Encounter: Payer: Self-pay | Admitting: Internal Medicine

## 2020-05-08 VITALS — BP 121/71 | HR 59 | Temp 97.3°F | Ht 62.0 in | Wt 174.4 lb

## 2020-05-08 DIAGNOSIS — K219 Gastro-esophageal reflux disease without esophagitis: Secondary | ICD-10-CM | POA: Diagnosis not present

## 2020-05-08 DIAGNOSIS — K7460 Unspecified cirrhosis of liver: Secondary | ICD-10-CM

## 2020-05-08 DIAGNOSIS — Z8619 Personal history of other infectious and parasitic diseases: Secondary | ICD-10-CM

## 2020-05-08 NOTE — Progress Notes (Signed)
Referring Provider: Marcine Matar, MD Primary Care Physician:  Marcine Matar, MD Primary GI:  Dr. Jena Gauss  Chief Complaint  Patient presents with  . belching  . Abdominal Pain    Right side and into back    HPI:   Rachel Hudson is a 59 y.o. female who presents for follow-up on GERD and hepatitis C.  The patient was last seen in our office 08/07/2019 for GERD, history of hepatitis C, cirrhosis, dysphagia.  History of chronic hepatitis C status post eradication.  Colonoscopy up-to-date next due in 2027.  Most recent EGD in 2019 with hypertensive gastropathy, erosive gastropathy found to be antral mucosa with mild hyperemia negative for H. pylori on biopsy.  Recommended Protonix twice daily and repeat EGD in April 2019.  At her last visit she was still having GERD symptoms and some dysphagia about twice a day on Protonix 40 mg daily.  No other overt GI or hepatic symptoms.  She tried to have her labs completed at [facility] but they could not draw them because the labs were marked for "[facility]".  Recommended increase Protonix to 40 mg twice daily, EGD with possible dilation, abstain from all alcohol and high risk behaviors for hepatitis C, have labs updated, updated right upper quadrant ultrasound (all lab and imaging order sent to [facility]), follow-up in 6 months.  Labs are completed 08/14/2019 which found normal CBC, CMP, INR, AFP.  Meld score calculated at 7, child Pugh class A.  Right upper quadrant ultrasound completed 08/14/2019 which found consistent with hepatic cirrhosis without definite focal lesion.  EGD completed 10/25/2019 which found normal esophagus status post dilation, mild erosive reflux esophagitis, portal hypertensive gastropathy, otherwise normal.  Recommended continue current medications and repeat EGD in 2 years (2023) for screening purposes.  Today she states doing okay overall. She recently ha d significant surgery to her right had., shots in her  left hand related to pre-existing musculoskeletal issues. Has been having right-sided pain into her back. Thinks it is stress related. Is due for labs and imaging. Denies N/V, hematochezia, melena, fever, chills, unintentional weight loss. Bowel movements doing great. Denies any yellowing of the skin/eyes, darkened urine, acute episodic confusion, generalized tremors, generalized pruritis. Denies URI or flu-like symptoms. Denies loss of sense of taste or smell. The patient has received COVID-19 vaccination(s). Denies chest pain, dyspnea, dizziness, lightheadedness, syncope, near syncope. Denies any other upper or lower GI symptoms.  Drinks socially, one drink per 1-2 weeks. Has a lot of belching, but feels "definitely not as bad" and GERD otherwise doing well.  Past Medical History:  Diagnosis Date  . Anxiety   . Arthritis   . 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 GERD (gastroesophageal reflux disease)   . Gout   . Hep C w/o coma, chronic (HCC) As of 10/22/13  . Hypertension     Past Surgical History:  Procedure Laterality Date  . BACK SURGERY     lumbar fusion  . BIOPSY  09/12/2017   Procedure: BIOPSY;  Surgeon: Corbin Ade, MD;  [facility];  Service: Endoscopy;;  gastric  . CARPAL TUNNEL RELEASE Right 11/13/2015   Procedure: RIGHT CARPAL TUNNEL RELEASE;  Surgeon: Betha Loa, MD;  Location: [facility];  Service: Orthopedics;  Laterality: Right;  . CARPAL TUNNEL RELEASE Left 01/29/2016   Procedure: left CARPAL TUNNEL RELEASE;  Surgeon: Betha Loa, MD;  Location:  SURGERY  CENTER;  Service: Orthopedics;  Laterality: Left;  . COLONOSCOPY N/A 07/08/2015   Procedure: COLONOSCOPY;  Surgeon: Corbin Ade, MD;  [facility];  Service: Endoscopy;  Laterality: N/A;  230  . ESOPHAGOGASTRODUODENOSCOPY N/A 07/08/2015   Procedure: ESOPHAGOGASTRODUODENOSCOPY (EGD);  Surgeon: Corbin Ade, MD;  [facility];  Service:  Endoscopy;  Laterality: N/A;  . ESOPHAGOGASTRODUODENOSCOPY (EGD) WITH PROPOFOL N/A 09/12/2017   Procedure: ESOPHAGOGASTRODUODENOSCOPY (EGD) WITH PROPOFOL;  Surgeon: Corbin Ade, MD;  [facility];  Service: Endoscopy;  Laterality: N/A;  9:30am  . ESOPHAGOGASTRODUODENOSCOPY (EGD) WITH PROPOFOL N/A 10/25/2019   Procedure: ESOPHAGOGASTRODUODENOSCOPY (EGD) WITH PROPOFOL;  Surgeon: Corbin Ade, MD;  [facility];  Service: Endoscopy;  Laterality: N/A;  10:30am  . FINGER ARTHROSCOPY WITH CARPOMETACARPEL (CMC) ARTHROPLASTY Right 04/24/2020   Procedure: RIGHT INDEX FINGER METACARPAL PHALANGEAL ARTHROPLASTY;  Surgeon: Betha Loa, MD;  Location: [facility];  Service: Orthopedics;  Laterality: Right;  . LIGAMENT REPAIR Right 1980   Knee  . MALONEY DILATION N/A 09/12/2017   Procedure: Elease Hashimoto DILATION;  Surgeon: Corbin Ade, MD;  [facility];  Service: Endoscopy;  Laterality: N/A;  . Elease Hashimoto DILATION N/A 10/25/2019   Procedure: Elease Hashimoto DILATION;  Surgeon: Corbin Ade, MD;  [facility];  Service: Endoscopy;  Laterality: N/A;  . TRIGGER FINGER RELEASE Right 04/24/2020   Procedure: RIGHT LONG TRIGGER FINGER RELEASE TRIGGER FINGER/A-1 PULLEY;  Surgeon: Betha Loa, MD;  Location: [facility];  Service: Orthopedics;  Laterality: Right;    Current Outpatient Medications  Medication Sig Dispense Refill  . amLODipine (NORVASC) 10 MG tablet Take 1 tablet (10 mg total) by mouth daily. Must keep upcoming office visit for refills 30 tablet 6  . celecoxib (CELEBREX) 200 MG capsule Take 1 capsule (200 mg total) by mouth daily. 30 capsule 6  . COLLAGEN PO Take 3 g by mouth daily.    Marland Kitchen dexlansoprazole (DEXILANT) 60 MG capsule Take 1 capsule (60 mg total) by mouth daily. 30 capsule 11  . Flax Oil-Fish Oil-Borage Oil (FISH-FLAX-BORAGE PO) Take 1 capsule by mouth daily.    Marland Kitchen gabapentin (NEURONTIN) 800 MG tablet TAKE 1 & 1/2 (ONE &  ONE-HALF) TABLETS BY MOUTH THREE TIMES DAILY 135 tablet 2  . Lecith-Inosi-Chol-B12-Liver (LIVERITE) 1.125 MCG TABS Take 1 tablet by mouth 2 (two) times daily. LIVER AID    . milk thistle 175 MG tablet Take 175 mg by mouth daily.    Marland Kitchen MITIGARE 0.6 MG CAPS Take 1 capsule by mouth 2 (two) times daily. 60 capsule 6  . Multiple Vitamin (MULTIVITAMIN WITH MINERALS) TABS tablet Take 1 tablet by mouth daily.    . Oxycodone HCl 10 MG TABS Take 1-2 tablets by mouth every 4 (four) hours.    . Tetrahydrozoline HCl (VISINE OP) Place 1 drop into both eyes daily as needed (allergies).    Marland Kitchen oxyCODONE (ROXICODONE) 5 MG immediate release tablet 1-2 tabs PO q6 hours prn pain (Patient not taking: Reported on 05/08/2020) 20 tablet 0   No current facility-administered medications for this visit.    Allergies as of 05/08/2020 - Review Complete 05/08/2020  Allergen Reaction Noted  . Penicillins  03/28/2011  . Tylenol [acetaminophen] Other (See Comments) 07/14/2014    Family History  Adopted: Yes  Problem Relation Age of Onset  . Colon cancer Neg Hx     Social History   Socioeconomic History  . Marital status: Single  Spouse name: Not on file  . Number of children: Not on file  . Years of education: Not on file  . Highest education level: Not on file  Occupational History  . Not on file  Tobacco Use  . Smoking status: Never Smoker  . Smokeless tobacco: Never Used  Vaping Use  . Vaping Use: Never used  Substance and Sexual Activity  . Alcohol use: Yes    Alcohol/week: 0.0 standard drinks    Comment: has been drinking once/week since October  . Drug use: Yes    Frequency: 7.0 times per week    Types: Marijuana    Comment: daily for arthritis  . Sexual activity: Not Currently    Birth control/protection: None  Other Topics Concern  . Not on file  Social History Narrative  . Not on file   Social Determinants of Health   Financial Resource Strain: Not on file  Food Insecurity: Not on  file  Transportation Needs: Not on file  Physical Activity: Not on file  Stress: Not on file  Social Connections: Not on file    Subjective: Review of Systems  Constitutional: Negative for chills, fever, malaise/fatigue and weight loss.  HENT: Negative for congestion and sore throat.   Respiratory: Negative for cough and shortness of breath.   Cardiovascular: Negative for chest pain and palpitations.  Gastrointestinal: Positive for abdominal pain. Negative for blood in stool, constipation, diarrhea, heartburn, melena, nausea and vomiting.  Musculoskeletal: Positive for joint pain. Negative for myalgias.  Skin: Negative for rash.  Neurological: Negative for dizziness and weakness.  Endo/Heme/Allergies: Does not bruise/bleed easily.  Psychiatric/Behavioral: Negative for depression. The patient is not nervous/anxious.   All other systems reviewed and are negative.    Objective: BP 121/71   Pulse (!) 59   Temp (!) 97.3 F (36.3 C) (Temporal)   Ht 5\' 2"  (1.575 m)   Wt 174 lb 6.4 oz (79.1 kg)   LMP 09/29/2016 (Approximate)   BMI 31.90 kg/m  Physical Exam Vitals and nursing note reviewed.  Constitutional:      General: She is not in acute distress.    Appearance: Normal appearance. She is well-developed. She is obese. She is not ill-appearing, toxic-appearing or diaphoretic.  HENT:     Head: Normocephalic and atraumatic.     Nose: No congestion or rhinorrhea.  Eyes:     General: No scleral icterus. Cardiovascular:     Rate and Rhythm: Normal rate and regular rhythm.     Heart sounds: Normal heart sounds.  Pulmonary:     Effort: Pulmonary effort is normal. No respiratory distress.     Breath sounds: Normal breath sounds.  Abdominal:     General: Bowel sounds are normal.     Palpations: Abdomen is soft. There is no hepatomegaly, splenomegaly or mass.     Tenderness: There is no abdominal tenderness. There is no guarding or rebound.     Hernia: No hernia is present.   Musculoskeletal:     Right hand: Tenderness present.     Left hand: Tenderness present.     Comments: Surgical brace and traction in place right hand/forearm  Skin:    General: Skin is warm and dry.     Coloration: Skin is not jaundiced.     Findings: No rash.  Neurological:     General: No focal deficit present.     Mental Status: She is alert and oriented to person, place, and time.  Psychiatric:  Attention and Perception: Attention normal.        Mood and Affect: Mood normal.        Speech: Speech normal.        Behavior: Behavior normal.        Thought Content: Thought content normal.        Cognition and Memory: Cognition and memory normal.      Assessment:  Very pleasant 59 year old female presents for follow-up on GERD, history of hepatitis C status post treatment and eradication, cirrhosis.  Overall the patient is doing well from a GI perspective.  She has had some musculoskeletal issues and recent surgery and is continuing to be in pain from this.  She has a rather significant brace with apparent surgical traction in place to her right hand/wrist.  No red flag/warning signs or symptoms.  Historically well compensated disease.  She does complain of some right-sided abdominal pain today that is intermittent, not very severe, and thinks it is related to stress.  History of hepatitis C status post treatment and cirrhosis: Hepatitis C status post treatment and eradication.  She does have cirrhosis and continues on routine cirrhosis care.  She does have historically well compensated disease.  She is currently due for labs and imaging.  Further recommendations to follow.  GERD: Currently doing well on PPI.  She does have some belching but states it is significantly better than it was.  No other overt GERD symptoms.  Recommend she continue her PPI.  Abdominal pain: Potentially stress related or musculoskeletal in nature.  I was unable to reproduce pain on exam.  She states is  intermittent and not very severe.  She is glad she is having an ultrasound done for her liver which will help evaluate as well.  Further recommendations to follow.  She is notify us of any worsening.   Plan: 1. Continue current medications 2. Update liver labs including CBC, CMP, AFP, INR 3. Right upper quadrant ultrasound 4. Call for any worsening or severe symptoms 5. Follow-up in 6 months    Thank you for allowing [name] to participate in the care of Rachel Lis, DNP, AGNP-C Adult & Gerontological Nurse Practitioner [facility]   05/08/2020 10:40 AM   Disclaimer: This note was dictated with voice recognition software. Similar sounding words can inadvertently be transcribed and may not be corrected upon review.

## 2020-05-08 NOTE — Progress Notes (Signed)
CC'ED TO PCP 

## 2020-05-08 NOTE — Patient Instructions (Signed)
Your health issues we discussed today were:   Cirrhosis from hepatitis C: 1. Have your labs completed as soon as you are able to.  I have marked these to be completed at [facility] 2. Somebody will call to help schedule your ultrasound.  I also marked this to be completed at [facility] 3. Call us if you have any worsening or recurrent symptoms 4. Call us if you have any worrisome symptoms such as yellowing of your skin or eyes, brown urine, sudden confusion, etc.  Abdominal pain: 1. I hope this gets better soon! 2. We will call you with the results of your ultrasound and let you know if there is anything found 3. Call us if you have any worsening or severe symptoms  GERD (reflux/heartburn): 1. I am glad your reflux is doing well 2. Call us if you have any worsening or severe symptoms 3. Continue taking Dexilant 60 mg daily  Overall I recommend:  1. Continue other current medications 2. Return for follow-up in 6 months 3. Call us for any questions or concerns   ---------------------------------------------------------------  I am glad you have gotten your COVID-19 vaccination!  Even though you are fully vaccinated you should continue to follow CDC and state/local guidelines.  ---------------------------------------------------------------   At [facility] we value your feedback. You may receive a survey about your visit today. Please share your experience as we strive to create trusting relationships with our patients to provide genuine, compassionate, quality care.  We appreciate your understanding and patience as we review any laboratory studies, imaging, and other diagnostic tests that are ordered as we care for you. Our office policy is 5 business days for review of these results, and any emergent or urgent results are addressed in a timely manner for your best interest. If you do not hear from our office in 1 week, please contact us.   We also  encourage the use of MyChart, which contains your medical information for your review as well. If you are not enrolled in this feature, an access code is on this after visit summary for your convenience. Thank you for allowing [name] to be involved in your care.  It was great to see you today!  I hope you have a Merry Christmas and [address]!!

## 2020-05-13 ENCOUNTER — Telehealth: Payer: Self-pay

## 2020-05-13 NOTE — Telephone Encounter (Signed)
[name] abd RUQ scheduled for 05/26/20 at 8:00am, arrive at 7:45am. NPO after midnight before test. [name] at [facility] per pt request.  Called and informed pt of [name] appt. Letter mailed.

## 2020-05-26 ENCOUNTER — Other Ambulatory Visit: Payer: Self-pay

## 2020-05-26 ENCOUNTER — Ambulatory Visit (HOSPITAL_COMMUNITY)
Admission: RE | Admit: 2020-05-26 | Discharge: 2020-05-26 | Disposition: A | Discharge status: Home / Self Care | Payer: Medicaid Other | Source: Ambulatory Visit | Attending: Nurse Practitioner | Admitting: Nurse Practitioner

## 2020-05-26 DIAGNOSIS — K219 Gastro-esophageal reflux disease without esophagitis: Secondary | ICD-10-CM | POA: Diagnosis not present

## 2020-05-26 DIAGNOSIS — Z8619 Personal history of other infectious and parasitic diseases: Secondary | ICD-10-CM | POA: Insufficient documentation

## 2020-05-26 DIAGNOSIS — K7460 Unspecified cirrhosis of liver: Secondary | ICD-10-CM | POA: Insufficient documentation

## 2020-05-26 DIAGNOSIS — K7689 Other specified diseases of liver: Secondary | ICD-10-CM | POA: Diagnosis not present

## 2020-05-30 ENCOUNTER — Other Ambulatory Visit: Payer: Self-pay | Admitting: Internal Medicine

## 2020-05-30 DIAGNOSIS — I10 Essential (primary) hypertension: Secondary | ICD-10-CM

## 2020-06-04 DIAGNOSIS — M65321 Trigger finger, right index finger: Secondary | ICD-10-CM | POA: Diagnosis not present

## 2020-06-05 DIAGNOSIS — M25641 Stiffness of right hand, not elsewhere classified: Secondary | ICD-10-CM | POA: Diagnosis not present

## 2020-06-06 ENCOUNTER — Telehealth: Payer: Self-pay | Admitting: Internal Medicine

## 2020-06-06 NOTE — Telephone Encounter (Signed)
Returning call about her results. [phone]

## 2020-06-06 NOTE — Telephone Encounter (Signed)
Lmom for pt to call us back. 

## 2020-06-11 NOTE — Telephone Encounter (Signed)
Called pt and made her aware of results.  Pt voiced understanding.

## 2020-06-12 DIAGNOSIS — Z79899 Other long term (current) drug therapy: Secondary | ICD-10-CM | POA: Diagnosis not present

## 2020-06-12 DIAGNOSIS — E559 Vitamin D deficiency, unspecified: Secondary | ICD-10-CM | POA: Diagnosis not present

## 2020-06-19 DIAGNOSIS — M25642 Stiffness of left hand, not elsewhere classified: Secondary | ICD-10-CM | POA: Diagnosis not present

## 2020-06-19 DIAGNOSIS — M79642 Pain in left hand: Secondary | ICD-10-CM | POA: Diagnosis not present

## 2020-06-19 DIAGNOSIS — M19042 Primary osteoarthritis, left hand: Secondary | ICD-10-CM | POA: Diagnosis not present

## 2020-06-19 DIAGNOSIS — M79644 Pain in right finger(s): Secondary | ICD-10-CM | POA: Diagnosis not present

## 2020-06-19 DIAGNOSIS — M65332 Trigger finger, left middle finger: Secondary | ICD-10-CM | POA: Diagnosis not present

## 2020-06-19 DIAGNOSIS — M19041 Primary osteoarthritis, right hand: Secondary | ICD-10-CM | POA: Diagnosis not present

## 2020-06-19 DIAGNOSIS — M25641 Stiffness of right hand, not elsewhere classified: Secondary | ICD-10-CM | POA: Diagnosis not present

## 2020-06-24 DIAGNOSIS — M79644 Pain in right finger(s): Secondary | ICD-10-CM | POA: Diagnosis not present

## 2020-06-24 DIAGNOSIS — M19042 Primary osteoarthritis, left hand: Secondary | ICD-10-CM | POA: Diagnosis not present

## 2020-06-24 DIAGNOSIS — M25641 Stiffness of right hand, not elsewhere classified: Secondary | ICD-10-CM | POA: Diagnosis not present

## 2020-06-24 DIAGNOSIS — M19041 Primary osteoarthritis, right hand: Secondary | ICD-10-CM | POA: Diagnosis not present

## 2020-06-24 DIAGNOSIS — M65332 Trigger finger, left middle finger: Secondary | ICD-10-CM | POA: Diagnosis not present

## 2020-06-24 DIAGNOSIS — M25642 Stiffness of left hand, not elsewhere classified: Secondary | ICD-10-CM | POA: Diagnosis not present

## 2020-06-26 ENCOUNTER — Other Ambulatory Visit: Payer: Self-pay | Admitting: Internal Medicine

## 2020-06-26 DIAGNOSIS — I10 Essential (primary) hypertension: Secondary | ICD-10-CM

## 2020-07-02 DIAGNOSIS — M19042 Primary osteoarthritis, left hand: Secondary | ICD-10-CM | POA: Diagnosis not present

## 2020-07-02 DIAGNOSIS — M25641 Stiffness of right hand, not elsewhere classified: Secondary | ICD-10-CM | POA: Diagnosis not present

## 2020-07-02 DIAGNOSIS — M79642 Pain in left hand: Secondary | ICD-10-CM | POA: Diagnosis not present

## 2020-07-02 DIAGNOSIS — M79644 Pain in right finger(s): Secondary | ICD-10-CM | POA: Diagnosis not present

## 2020-07-02 DIAGNOSIS — M65332 Trigger finger, left middle finger: Secondary | ICD-10-CM | POA: Diagnosis not present

## 2020-07-02 DIAGNOSIS — M19041 Primary osteoarthritis, right hand: Secondary | ICD-10-CM | POA: Diagnosis not present

## 2020-07-04 ENCOUNTER — Other Ambulatory Visit: Payer: Self-pay | Admitting: Internal Medicine

## 2020-07-04 DIAGNOSIS — G5603 Carpal tunnel syndrome, bilateral upper limbs: Secondary | ICD-10-CM

## 2020-07-04 DIAGNOSIS — M47816 Spondylosis without myelopathy or radiculopathy, lumbar region: Secondary | ICD-10-CM

## 2020-07-04 DIAGNOSIS — M19041 Primary osteoarthritis, right hand: Secondary | ICD-10-CM

## 2020-07-11 ENCOUNTER — Other Ambulatory Visit: Payer: Self-pay | Admitting: *Deleted

## 2020-07-11 DIAGNOSIS — M19042 Primary osteoarthritis, left hand: Secondary | ICD-10-CM | POA: Diagnosis not present

## 2020-07-11 DIAGNOSIS — M25641 Stiffness of right hand, not elsewhere classified: Secondary | ICD-10-CM | POA: Diagnosis not present

## 2020-07-11 DIAGNOSIS — M79644 Pain in right finger(s): Secondary | ICD-10-CM | POA: Diagnosis not present

## 2020-07-11 DIAGNOSIS — M79642 Pain in left hand: Secondary | ICD-10-CM | POA: Diagnosis not present

## 2020-07-11 DIAGNOSIS — Z471 Aftercare following joint replacement surgery: Secondary | ICD-10-CM | POA: Diagnosis not present

## 2020-07-11 DIAGNOSIS — Z96691 Finger-joint replacement of right hand: Secondary | ICD-10-CM | POA: Diagnosis not present

## 2020-07-11 DIAGNOSIS — M19041 Primary osteoarthritis, right hand: Secondary | ICD-10-CM | POA: Diagnosis not present

## 2020-07-11 DIAGNOSIS — M25642 Stiffness of left hand, not elsewhere classified: Secondary | ICD-10-CM | POA: Diagnosis not present

## 2020-07-11 NOTE — Patient Outreach (Signed)
Care Coordination  07/11/2020  AVON MOLOCK 1961/04/06 [id number]    Medicaid Managed Care   Unsuccessful Outreach Note  07/11/2020 Name: Rachel Hudson MRN: [id number] DOB: May 21, 1961  Referred by: Marcine Matar, MD Reason for referral : High Risk Managed Medicaid (Unsuccessful RNCM initial outreach)   An unsuccessful telephone outreach was attempted today. The patient was referred to the case management team for assistance with care management and care coordination.   Follow Up Plan: A HIPAA compliant phone message was left for the patient providing contact information and requesting a return call.  The care management team will reach out to the patient again over the next 7-14 days.   Estanislado Emms RN, BSN [place]  [employer]

## 2020-07-11 NOTE — Patient Instructions (Signed)
Visit Information  Ms. Rachel Hudson  - as a part of your Medicaid benefit, you are eligible for care management and care coordination services at no cost or copay. I was unable to reach you by phone today but would be happy to help you with your health related needs. Please feel free to call me @ [phone].   A member of the Managed Medicaid care management team will reach out to you again over the next 7-14 days.   Estanislado Emms RN, BSN [place]  [employer]

## 2020-07-14 DIAGNOSIS — Z79899 Other long term (current) drug therapy: Secondary | ICD-10-CM | POA: Diagnosis not present

## 2020-07-17 DIAGNOSIS — M25641 Stiffness of right hand, not elsewhere classified: Secondary | ICD-10-CM | POA: Diagnosis not present

## 2020-07-17 DIAGNOSIS — M79642 Pain in left hand: Secondary | ICD-10-CM | POA: Diagnosis not present

## 2020-07-17 DIAGNOSIS — M79644 Pain in right finger(s): Secondary | ICD-10-CM | POA: Diagnosis not present

## 2020-07-17 DIAGNOSIS — M19041 Primary osteoarthritis, right hand: Secondary | ICD-10-CM | POA: Diagnosis not present

## 2020-07-17 DIAGNOSIS — M19042 Primary osteoarthritis, left hand: Secondary | ICD-10-CM | POA: Diagnosis not present

## 2020-07-18 ENCOUNTER — Telehealth: Payer: Self-pay | Admitting: Internal Medicine

## 2020-07-18 NOTE — Telephone Encounter (Signed)
Copied from CRM [id number]. Topic: General - Other >> Jul 18, 2020  8:39 AM Tamela Oddi wrote: Reason for CRM: Patient called to request that her medical records be sent to [name].  Please call patient to confirm at [phone]   Called patient and advised her to call [phone] regarding her medical records request.   If patient calls back please advise her that she needs to come into the office to feel out a medical records authorization form authorizing [name] to send the medical records to [facility]. Patient will need to bring ID day she comes to fill out form. Patient can come to front desk to request release.

## 2020-07-30 ENCOUNTER — Other Ambulatory Visit: Payer: Self-pay | Admitting: Internal Medicine

## 2020-07-30 DIAGNOSIS — I10 Essential (primary) hypertension: Secondary | ICD-10-CM

## 2020-07-30 NOTE — Telephone Encounter (Signed)
Pt is aware must come in to sign medical release form and bring ID

## 2020-07-31 ENCOUNTER — Telehealth: Payer: Self-pay

## 2020-07-31 DIAGNOSIS — E669 Obesity, unspecified: Secondary | ICD-10-CM

## 2020-07-31 NOTE — Addendum Note (Signed)
Addended by: Jonah Blue B on: 07/31/2020 10:27 PM   Modules accepted: Orders

## 2020-07-31 NOTE — Telephone Encounter (Signed)
Patient came into the clinic to sign a medical release form. Patient also stated she would like for PCP to sign her up for weight loss program.  Please contact the patient for further information. [phone]

## 2020-07-31 NOTE — Telephone Encounter (Signed)
Will forward to provider  Pt is requesting referral for weight loss

## 2020-08-01 NOTE — Telephone Encounter (Signed)
Contacted pt to make aware referral has been placed pt didn't answer left a detailed vm

## 2020-08-04 ENCOUNTER — Other Ambulatory Visit: Payer: Self-pay

## 2020-08-04 ENCOUNTER — Ambulatory Visit: Payer: Self-pay

## 2020-08-04 ENCOUNTER — Encounter (HOSPITAL_COMMUNITY): Payer: Self-pay

## 2020-08-04 ENCOUNTER — Ambulatory Visit (HOSPITAL_COMMUNITY)
Admission: EM | Admit: 2020-08-04 | Discharge: 2020-08-04 | Disposition: A | Discharge status: Home / Self Care | Payer: Medicaid Other | Attending: Urgent Care | Admitting: Urgent Care

## 2020-08-04 DIAGNOSIS — M25511 Pain in right shoulder: Secondary | ICD-10-CM

## 2020-08-04 DIAGNOSIS — M47812 Spondylosis without myelopathy or radiculopathy, cervical region: Secondary | ICD-10-CM

## 2020-08-04 DIAGNOSIS — M25512 Pain in left shoulder: Secondary | ICD-10-CM

## 2020-08-04 MED ORDER — KETOROLAC TROMETHAMINE 60 MG/2ML IM SOLN
60.0000 mg | Freq: Once | INTRAMUSCULAR | Status: AC
  Administered 2020-08-04: 60 mg via INTRAMUSCULAR

## 2020-08-04 MED ORDER — TIZANIDINE HCL 4 MG PO TABS: 4.0000 mg | ORAL_TABLET | Freq: Every day | ORAL | 0 refills | Status: DC

## 2020-08-04 MED ORDER — KETOROLAC TROMETHAMINE 60 MG/2ML IM SOLN
INTRAMUSCULAR | Status: AC
  Filled 2020-08-04: qty 2

## 2020-08-04 NOTE — ED Triage Notes (Signed)
Pt presents with complaints of right shoulder pain x 1 week. Reports the pain got worse this morning when she tried to get out of the bed. Pt denies any injury. Reports she is waiting for an MRI to get authorized for her neck.

## 2020-08-04 NOTE — ED Provider Notes (Signed)
[facility]   MRN: [id number] DOB: 1960-08-04  Subjective:   Rachel Hudson is a 60 y.o. female presenting for 1 week history of intermittent bilateral shoulder pain right worse than left.  Patient also has a history of spondylolisthesis of the lumbar region, is status post lumbar spinal fusion, spondylosis of the cervical region.  Denies any new fall or trauma.  She is working with her regular care doctors through [facility]. Takes oxycodone daily for chronic pain. She is supposed to get an MRI in the near future but has not done this.  Patient has gotten multiple rounds of steroids this year already.  She does not want to do more steroids.  Responds well to NSAID injections per patient.  Denies new weakness, numbness, tingling, loss of range of motion.  Sometimes she gets relief from raising either arm above her head.  No current facility-administered medications for this encounter.  Current Outpatient Medications:  .  amLODipine (NORVASC) 10 MG tablet, Take 1 tablet by mouth once daily, Disp: 30 tablet, Rfl: 0 .  celecoxib (CELEBREX) 200 MG capsule, Take 1 capsule by mouth once daily, Disp: 30 capsule, Rfl: 0 .  COLLAGEN PO, Take 3 g by mouth daily., Disp: , Rfl:  .  dexlansoprazole (DEXILANT) 60 MG capsule, Take 1 capsule (60 mg total) by mouth daily., Disp: 30 capsule, Rfl: 11 .  Flax Oil-Fish Oil-Borage Oil (FISH-FLAX-BORAGE PO), Take 1 capsule by mouth daily., Disp: , Rfl:  .  gabapentin (NEURONTIN) 800 MG tablet, TAKE 1 & 1/2 (ONE & ONE-HALF) TABLETS BY MOUTH THREE TIMES DAILY, Disp: 135 tablet, Rfl: 0 .  Lecith-Inosi-Chol-B12-Liver (LIVERITE) 1.125 MCG TABS, Take 1 tablet by mouth 2 (two) times daily. LIVER AID, Disp: , Rfl:  .  milk thistle 175 MG tablet, Take 175 mg by mouth daily., Disp: , Rfl:  .  MITIGARE 0.6 MG CAPS, Take 1 capsule by mouth twice daily, Disp: 60 capsule, Rfl: 0 .  Multiple Vitamin (MULTIVITAMIN WITH MINERALS) TABS tablet, Take 1  tablet by mouth daily., Disp: , Rfl:  .  oxyCODONE (ROXICODONE) 5 MG immediate release tablet, 1-2 tabs PO q6 hours prn pain (Patient not taking: No sig reported), Disp: 20 tablet, Rfl: 0 .  Oxycodone HCl 10 MG TABS, Take 1-2 tablets by mouth every 4 (four) hours., Disp: , Rfl:  .  Tetrahydrozoline HCl (VISINE OP), Place 1 drop into both eyes daily as needed (allergies)., Disp: , Rfl:    Allergies  Allergen Reactions  . Penicillins     Facial swelling Has patient had a PCN reaction causing immediate rash, facial/tongue/throat swelling, SOB or lightheadedness with hypotension: Yes Has patient had a PCN reaction causing severe rash involving mucus membranes or skin necrosis: No Has patient had a PCN reaction that required hospitalization No Has patient had a PCN reaction occurring within the last 10 years: Yes If all of the above answers are "NO", then may proceed with Cephalosporin use.   . Tylenol [Acetaminophen] Other (See Comments)    HX of Hep. C    Past Medical History:  Diagnosis Date  . Anxiety   . Arthritis   . Cirrhosis of liver [facility]) September 2015   Stage 4  . GERD (gastroesophageal reflux disease)   . Gout   . Hep C w/o coma, chronic (HCC) As of 10/22/13  . Hypertension      Past Surgical History:  Procedure Laterality Date  . BACK SURGERY  lumbar fusion  . BIOPSY  09/12/2017   Procedure: BIOPSY;  Surgeon: Corbin Ade, MD;  [facility];  Service: Endoscopy;;  gastric  . CARPAL TUNNEL RELEASE Right 11/13/2015   Procedure: RIGHT CARPAL TUNNEL RELEASE;  Surgeon: Betha Loa, MD;  Location: [facility];  Service: Orthopedics;  Laterality: Right;  . CARPAL TUNNEL RELEASE Left 01/29/2016   Procedure: left CARPAL TUNNEL RELEASE;  Surgeon: Betha Loa, MD;  Location: [facility];  Service: Orthopedics;  Laterality: Left;  . COLONOSCOPY N/A 07/08/2015   Procedure: COLONOSCOPY;  Surgeon: Corbin Ade, MD;  [facility];   Service: Endoscopy;  Laterality: N/A;  230  . ESOPHAGOGASTRODUODENOSCOPY N/A 07/08/2015   Procedure: ESOPHAGOGASTRODUODENOSCOPY (EGD);  Surgeon: Corbin Ade, MD;  [facility];  Service: Endoscopy;  Laterality: N/A;  . ESOPHAGOGASTRODUODENOSCOPY (EGD) WITH PROPOFOL N/A 09/12/2017   Procedure: ESOPHAGOGASTRODUODENOSCOPY (EGD) WITH PROPOFOL;  Surgeon: Corbin Ade, MD;  [facility];  Service: Endoscopy;  Laterality: N/A;  9:30am  . ESOPHAGOGASTRODUODENOSCOPY (EGD) WITH PROPOFOL N/A 10/25/2019   Procedure: ESOPHAGOGASTRODUODENOSCOPY (EGD) WITH PROPOFOL;  Surgeon: Corbin Ade, MD;  [facility];  Service: Endoscopy;  Laterality: N/A;  10:30am  . FINGER ARTHROSCOPY WITH CARPOMETACARPEL (CMC) ARTHROPLASTY Right 04/24/2020   Procedure: RIGHT INDEX FINGER METACARPAL PHALANGEAL ARTHROPLASTY;  Surgeon: Betha Loa, MD;  Location: [facility];  Service: Orthopedics;  Laterality: Right;  . LIGAMENT REPAIR Right 1980   Knee  . MALONEY DILATION N/A 09/12/2017   Procedure: Elease Hashimoto DILATION;  Surgeon: Corbin Ade, MD;  [facility];  Service: Endoscopy;  Laterality: N/A;  . Elease Hashimoto DILATION N/A 10/25/2019   Procedure: Elease Hashimoto DILATION;  Surgeon: Corbin Ade, MD;  [facility];  Service: Endoscopy;  Laterality: N/A;  . TRIGGER FINGER RELEASE Right 04/24/2020   Procedure: RIGHT LONG TRIGGER FINGER RELEASE TRIGGER FINGER/A-1 PULLEY;  Surgeon: Betha Loa, MD;  Location: [facility];  Service: Orthopedics;  Laterality: Right;    Family History  Adopted: Yes  Problem Relation Age of Onset  . Colon cancer Neg Hx     Social History   Tobacco Use  . Smoking status: Never Smoker  . Smokeless tobacco: Never Used  Vaping Use  . Vaping Use: Never used  Substance Use Topics  . Alcohol use: Yes    Alcohol/week: 0.0 standard drinks    Comment: has been drinking once/week since October  . Drug use: Yes    Frequency:  7.0 times per week    Types: Marijuana    Comment: daily for arthritis    ROS   Objective:   Vitals: BP 124/74   Pulse 64   Temp 98 F (36.7 C)   Resp 19   LMP 09/29/2016 (Approximate)   SpO2 94%   Physical Exam Constitutional:      General: She is not in acute distress.    Appearance: Normal appearance. She is well-developed. She is not ill-appearing, toxic-appearing or diaphoretic.  HENT:     Head: Normocephalic and atraumatic.     Nose: Nose normal.     Mouth/Throat:     Mouth: Mucous membranes are moist.     Pharynx: Oropharynx is clear.  Eyes:     General: No scleral icterus.    Extraocular Movements: Extraocular movements intact.     Pupils: Pupils are equal, round, and reactive to light.  Cardiovascular:     Rate and Rhythm:  Normal rate.  Pulmonary:     Effort: Pulmonary effort is normal.  Musculoskeletal:     Right shoulder: Tenderness (lateral and posterior) present. No swelling, deformity, effusion, laceration, bony tenderness or crepitus. Normal range of motion. Normal strength.     Left shoulder: No swelling, deformity, effusion, laceration, tenderness, bony tenderness or crepitus. Normal range of motion. Normal strength.     Cervical back: Spasms and tenderness present. No swelling, edema, deformity, erythema, signs of trauma, lacerations, rigidity, torticollis, bony tenderness or crepitus. Pain with movement present. Decreased range of motion.  Skin:    General: Skin is warm and dry.  Neurological:     General: No focal deficit present.     Mental Status: She is alert and oriented to person, place, and time.     Cranial Nerves: No cranial nerve deficit.     Motor: No weakness.     Coordination: Coordination normal.     Gait: Gait normal.     Deep Tendon Reflexes: Reflexes normal.  Psychiatric:        Mood and Affect: Mood normal.        Behavior: Behavior normal.        Thought Content: Thought content normal.        Judgment: Judgment normal.      Assessment and Plan :   PDMP not reviewed this encounter.  1. Acute pain of both shoulders   2. Cervical spondylosis without myelopathy     Patient refused steroids and I am in agreement as she has had multiple rounds this year. IM Toradol in clinic. Tizanidine as an outpatient.  Recommended follow up with her PCP. Maintain regular pain medications as prescribed by PCP. Counseled patient on potential for adverse effects with medications prescribed/recommended today, ER and return-to-clinic precautions discussed, patient verbalized understanding.    Wallis Bamberg, PA-C 08/04/20 1629

## 2020-08-11 ENCOUNTER — Other Ambulatory Visit: Payer: Self-pay | Admitting: Internal Medicine

## 2020-08-11 DIAGNOSIS — M47816 Spondylosis without myelopathy or radiculopathy, lumbar region: Secondary | ICD-10-CM

## 2020-08-11 DIAGNOSIS — Z79899 Other long term (current) drug therapy: Secondary | ICD-10-CM | POA: Diagnosis not present

## 2020-08-11 DIAGNOSIS — M19042 Primary osteoarthritis, left hand: Secondary | ICD-10-CM

## 2020-08-11 DIAGNOSIS — G5603 Carpal tunnel syndrome, bilateral upper limbs: Secondary | ICD-10-CM

## 2020-08-11 DIAGNOSIS — M19041 Primary osteoarthritis, right hand: Secondary | ICD-10-CM

## 2020-08-14 ENCOUNTER — Other Ambulatory Visit: Payer: Self-pay | Admitting: *Deleted

## 2020-08-14 ENCOUNTER — Other Ambulatory Visit: Payer: Self-pay

## 2020-08-14 ENCOUNTER — Telehealth: Payer: Self-pay | Admitting: Internal Medicine

## 2020-08-14 DIAGNOSIS — M25511 Pain in right shoulder: Secondary | ICD-10-CM

## 2020-08-14 NOTE — Telephone Encounter (Signed)
Will forward to provider to place referral  

## 2020-08-14 NOTE — Patient Outreach (Signed)
Medicaid Managed Care   Nurse Care Manager Note  08/14/2020 Name:  Rachel Hudson MRN:  [id number] DOB:  [birth year]  Rachel Hudson is an 60 y.o. year old female who is a primary patient of Marcine Matar, MD.  The [facility] Managed Care Coordination team was consulted for assistance with:    chronic pain  Ms. Lengacher was given information about Medicaid Managed Care Coordination team services today. [name] agreed to services and verbal consent obtained.  Engaged with patient by telephone for initial visit in response to provider referral for case management and/or care coordination services.   Assessments/Interventions:  Review of past medical history, allergies, medications, health status, including review of consultants reports, laboratory and other test data, was performed as part of comprehensive evaluation and provision of chronic care management services.  SDOH (Social Determinants of Health) assessments and interventions performed:   Care Plan  Allergies  Allergen Reactions  . Penicillins     Facial swelling Has patient had a PCN reaction causing immediate rash, facial/tongue/throat swelling, SOB or lightheadedness with hypotension: Yes Has patient had a PCN reaction causing severe rash involving mucus membranes or skin necrosis: No Has patient had a PCN reaction that required hospitalization No Has patient had a PCN reaction occurring within the last 10 years: Yes If all of the above answers are "NO", then may proceed with Cephalosporin use.   . Tylenol [Acetaminophen] Other (See Comments)    HX of Hep. C    Medications Reviewed Today    Reviewed by Heidi Dach, RN (Registered Nurse) on 08/14/20 at 1321  Med List Status: <None>  Medication Order Taking? Sig Documenting Provider Last Dose Status Informant  amLODipine (NORVASC) 10 MG tablet [id number] Yes Take 1 tablet by mouth once daily Marcine Matar, MD Taking Active   celecoxib (CELEBREX) 200 MG  capsule [id number] Yes Take 1 capsule by mouth once daily Marcine Matar, MD Taking Active   COLLAGEN PO [id number] Yes Take 3 g by mouth daily. [provider] Taking Active Self  dexlansoprazole (DEXILANT) 60 MG capsule [id number] Yes Take 1 capsule (60 mg total) by mouth daily. Corbin Ade, MD Taking Active   Flax Oil-Fish Oil-Borage Oil (FISH-FLAX-BORAGE PO) [id number] Yes Take 1 capsule by mouth daily. [provider] Taking Active Self  gabapentin (NEURONTIN) 800 MG tablet [id number] Yes TAKE 1 & 1/2 (ONE & ONE-HALF) TABLETS BY MOUTH THREE TIMES DAILY Marcine Matar, MD Taking Active   Lecith-Inosi-Chol-B12-Liver (LIVERITE) 1.125 MCG TABS [id number] Yes Take 1 tablet by mouth 2 (two) times daily. LIVER AID [provider] Taking Active Self  milk thistle 175 MG tablet [id number] Yes Take 175 mg by mouth daily. [provider] Taking Active Self  MITIGARE 0.6 MG CAPS [id number] Yes Take 1 capsule by mouth twice daily Marcine Matar, MD Taking Active   Multiple Vitamin (MULTIVITAMIN WITH MINERALS) TABS tablet [id number] Yes Take 1 tablet by mouth daily. [provider] Taking Active Self  OVER THE COUNTER MEDICATION [id number] Yes salompad [provider] Taking Active   oxyCODONE (ROXICODONE) 5 MG immediate release tablet [id number] No 1-2 tabs PO q6 hours prn pain  Patient not taking: No sig reported   Betha Loa, MD Not Taking Active   Oxycodone HCl 10 MG TABS [id number] Yes Take 1-2 tablets by mouth every 4 (four) hours. [provider] Taking Active   Tetrahydrozoline HCl (VISINE OP) [id number] No Place 1 drop into both eyes daily as  needed (allergies).  Patient not taking: Reported on 08/14/2020   [provider] Not Taking Active Self  tiZANidine (ZANAFLEX) 4 MG tablet [id number] No Take 1 tablet (4 mg total) by mouth at bedtime.  Patient not taking: Reported on 08/14/2020   Wallis Bamberg, PA-C Not Taking Active             Med Note (Chelsa Stout A   Thu Aug 14, 2020  1:18 PM) Patient does not like the way it makes her feel.          Patient Active Problem List   Diagnosis Date Noted  . Influenza vaccine needed 02/26/2020  . Bipolar affective disorder, currently manic, mild (HCC) 09/12/2017  . Dysphagia 08/24/2017  . Neuropathy of both feet 04/05/2017  . Status post lumbar spinal fusion 10/22/2016  . GERD (gastroesophageal reflux disease) 08/09/2016  . Spondylolisthesis of lumbar region 07/29/2016  . Sinus bradycardia 06/15/2016  . Gingivitis 06/15/2016  . ASCUS favor benign 11/13/2015  . History of hepatitis C 10/13/2015  . Bilateral carpal tunnel syndrome 09/16/2015  . Cervical spondylosis without myelopathy 09/16/2015  . Primary osteoarthritis of both hands 09/16/2015  . Diverticulosis of colon without hemorrhage   . Peptic ulcer disease   . 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 Lumbago 11/18/2014  . Carpal tunnel syndrome 11/18/2014  . Hepatic cirrhosis (HCC) 08/06/2014  . HTN (hypertension) 02/28/2014    Conditions to be addressed/monitored per PCP order:  chronic pain  Care Plan : General Plan of Care (Adult)  Updates made by Heidi Dach, RN since 08/14/2020 12:00 AM    Problem: Health Promotion or Disease Self-Management (General Plan of Care)     Long-Range Goal: Self-Management Plan Developed   Start Date: 08/14/2020  Expected End Date: 10/15/2020  This Visit's Progress: On track  Note:   Current Barriers:   Ineffective Self Health Maintenance-Ms. Modesitt has been managing her health until recently with worsening right shoulder pain. The pain has made it difficult to perform ADL and IADL. She has a friend who comes over everyday to assist her. She att[facility] the [facility] clinic at [facility] and has an MRI referral from [facility]. She would like to work on weight loss-PCP has referred to [facility].  Unable to independently  procure eye exam and transportation for medical appointments  Does not contact provider office for questions/concerns  Currently UNABLE TO independently self manage needs related to chronic health conditions.   Knowledge Deficits related to short term plan for care coordination needs and long term plans for chronic disease management needs Nurse Case Manager Clinical Goal(s):   patient will work with care management team to address care coordination and chronic disease management needs related to Care Coordination   Interventions:   Evaluation of current treatment plan related to shoulder pain and patient's adherence to plan as established by provider.  Advised patient to call [facility] [phone] for an eye exam  Reviewed medications with patient and discussed taking as directed  Discussed plans with patient for ongoing care management follow up and provided patient with direct contact information for care management team  Pharmacy referral for medication management  Provided the contact information to medical transportation provided by [facility] [phone]  Provided education on nutrition Self Care Activities:  . Patient will self administer medications as prescribed . Patient will attend all scheduled provider appointments . Patient will call pharmacy for medication refills . Patient will attend church  or other social activities . Patient will call provider office for new concerns or questions Patient Goals: - call [facility] [phone], to follow up on referral for weight management  - work with [facility] Pharmacist, Harrold Donath, for medication management - begin a notebook of services in my neighborhood or community - follow-up on any referrals for help I am given - make a list of family or friends that I can call  - call [facility] [phone] to schedule an eye exam - call [phone], 2-3 days before your appointment to arrange medical transportation provided  by [facility] - arrange a ride through an agency 1 week before appointment - ask family or friend for a ride - call to cancel if needed - keep a calendar with prescription refill dates - keep a calendar with appointment dates  Follow Up Plan: Telephone follow up appointment with care management team member scheduled for:09/15/20 @ 9am      Follow Up:  Patient agrees to Care Plan and Follow-up.  Plan: The Managed Medicaid care management team will reach out to the patient again over the next 30 days.  Date/time of next scheduled RN care management/care coordination outreach: 09/15/20 @ 9am  Estanislado Emms RN, BSN [place]  [employer]

## 2020-08-14 NOTE — Patient Instructions (Signed)
Visit Information  Ms. Shiroma was given information about Medicaid Managed Care team care coordination services as a part of their [facility] Community Plan Medicaid benefit. [name] verbally consented to engagement with the [facility] Managed Care team.   For questions related to your [facility], please call: [phone] or visit the homepage here: [email or web address]  If you would like to schedule transportation through your [facility], please call the following number at least 2 days in advance of your appointment: [phone].   Call the Behavioral Health Crisis Line at [phone], at any time, 24 hours a day, 7 days a week. If you are in danger or need immediate medical attention call 911.  Ms. Lecker - following are the goals we discussed in your visit today:  Goals Addressed            This Visit's Progress   . Find Help in My Community       Timeframe:  Long-Range Goal Priority:  Medium Start Date:    08/14/20                         Expected End Date:   10/15/20                    Follow Up Date 09/15/20   - call [facility] [phone], to follow up on referral for weight management  - work with [facility] Pharmacist, Harrold Donath, for medication management - begin a notebook of services in my neighborhood or community - follow-up on any referrals for help I am given - make a list of family or friends that I can call    Why is this important?    Knowing how and where to find help for yourself or family in your neighborhood and community is an important skill.   You will want to take some steps to learn how.    Notes:     Marland Kitchen Make and Keep All Appointments       Timeframe:  Long-Range Goal Priority:  Medium Start Date:  08/14/20                           Expected End Date:   10/15/20                    Follow Up Date 09/15/20  - call [facility]  [phone] to schedule an eye exam - call [phone], 2-3 days before your appointment to arrange medical transportation provided by [facility] - arrange a ride through an agency 1 week before appointment - ask family or friend for a ride - call to cancel if needed - keep a calendar with prescription refill dates - keep a calendar with appointment dates    Why is this important?    Part of staying healthy is seeing the doctor for follow-up care.   If you forget your appointments, there are some things you can do to stay on track.    Notes:        Please see education materials related to diet provided as print materials.       Calorie Counting for Weight Loss Calories are units of energy. Your body needs a certain number of calories from food to keep going throughout the day. When you eat or drink more calories than your body needs, your body stores the  extra calories mostly as fat. When you eat or drink fewer calories than your body needs, your body burns fat to get the energy it needs. Calorie counting means keeping track of how many calories you eat and drink each day. Calorie counting can be helpful if you need to lose weight. If you eat fewer calories than your body needs, you should lose weight. Ask your health care provider what a healthy weight is for you. For calorie counting to work, you will need to eat the right number of calories each day to lose a healthy amount of weight per week. A dietitian can help you figure out how many calories you need in a day and will suggest ways to reach your calorie goal.  A healthy amount of weight to lose each week is usually 1-2 lb (0.5-0.9 kg). This usually means that your daily calorie intake should be reduced by 500-750 calories.  Eating 1,200-1,500 calories a day can help most women lose weight.  Eating 1,500-1,800 calories a day can help most men lose weight. What do I need to know about calorie counting? Work with your health care  provider or dietitian to determine how many calories you should get each day. To meet your daily calorie goal, you will need to:  Find out how many calories are in each food that you would like to eat. Try to do this before you eat.  Decide how much of the food you plan to eat.  Keep a food log. Do this by writing down what you ate and how many calories it had. To successfully lose weight, it is important to balance calorie counting with a healthy lifestyle that includes regular activity. Where do I find calorie information? The number of calories in a food can be found on a Nutrition Facts label. If a food does not have a Nutrition Facts label, try to look up the calories online or ask your dietitian for help. Remember that calories are listed per serving. If you choose to have more than one serving of a food, you will have to multiply the calories per serving by the number of servings you plan to eat. For example, the label on a package of bread might say that a serving size is 1 slice and that there are 90 calories in a serving. If you eat 1 slice, you will have eaten 90 calories. If you eat 2 slices, you will have eaten 180 calories.   How do I keep a food log? After each time that you eat, record the following in your food log as soon as possible:  What you ate. Be sure to include toppings, sauces, and other extras on the food.  How much you ate. This can be measured in cups, ounces, or number of items.  How many calories were in each food and drink.  The total number of calories in the food you ate. Keep your food log near you, such as in a pocket-sized notebook or on an app or website on your mobile phone. Some programs will calculate calories for you and show you how many calories you have left to meet your daily goal. What are some portion-control tips?  Know how many calories are in a serving. This will help you know how many servings you can have of a certain food.  Use a  measuring cup to measure serving sizes. You could also try weighing out portions on a kitchen scale. With time, you will be able to  estimate serving sizes for some foods.  Take time to put servings of different foods on your favorite plates or in your favorite bowls and cups so you know what a serving looks like.  Try not to eat straight from a food's packaging, such as from a bag or box. Eating straight from the package makes it hard to see how much you are eating and can lead to overeating. Put the amount you would like to eat in a cup or on a plate to make sure you are eating the right portion.  Use smaller plates, glasses, and bowls for smaller portions and to prevent overeating.  Try not to multitask. For example, avoid watching TV or using your computer while eating. If it is time to eat, sit down at a table and enjoy your food. This will help you recognize when you are full. It will also help you be more mindful of what and how much you are eating. What are tips for following this plan? Reading food labels  Check the calorie count compared with the serving size. The serving size may be smaller than what you are used to eating.  Check the source of the calories. Try to choose foods that are high in protein, fiber, and vitamins, and low in saturated fat, trans fat, and sodium. Shopping  Read nutrition labels while you shop. This will help you make healthy decisions about which foods to buy.  Pay attention to nutrition labels for low-fat or fat-free foods. These foods sometimes have the same number of calories or more calories than the full-fat versions. They also often have added sugar, starch, or salt to make up for flavor that was removed with the fat.  Make a grocery list of lower-calorie foods and stick to it. Cooking  Try to cook your favorite foods in a healthier way. For example, try baking instead of frying.  Use low-fat dairy products. Meal planning  Use more fruits and  vegetables. One-half of your plate should be fruits and vegetables.  Include lean proteins, such as chicken, Mal[place]urkey, and fish. Lifestyle Each week, aim to do one of the following:  150 minutes of moderate exercise, such as walking.  75 minutes of vigorous exercise, such as running. General information  Know how many calories are in the foods you eat most often. This will help you calculate calorie counts faster.  Find a way of tracking calories that works for you. Get creative. Try different apps or programs if writing down calories does not work for you. What foods should I eat?  Eat nutritious foods. It is better to have a nutritious, high-calorie food, such as an avocado, than a food with few nutrients, such as a bag of potato chips.  Use your calories on foods and drinks that will fill you up and will not leave you hungry soon after eating. ? Examples of foods that fill you up are nuts and nut butters, vegetables, lean proteins, and high-fiber foods such as whole grains. High-fiber foods are foods with more than 5 g of fiber per serving.  Pay attention to calories in drinks. Low-calorie drinks include water and unsweetened drinks. The items listed above may not be a complete list of foods and beverages you can eat. Contact a dietitian for more information.   What foods should I limit? Limit foods or drinks that are not good sources of vitamins, minerals, or protein or that are high in unhealthy fats. These include:  Candy.  Other sweets.  Sodas, specialty coffee drinks, alcohol, and juice. The items listed above may not be a complete list of foods and beverages you should avoid. Contact a dietitian for more information. How do I count calories when eating out?  Pay attention to portions. Often, portions are much larger when eating out. Try these tips to keep portions smaller: ? Consider sharing a meal instead of getting your own. ? If you get your own meal, eat only half of  it. Before you start eating, ask for a container and put half of your meal into it. ? When available, consider ordering smaller portions from the menu instead of full portions.  Pay attention to your food and drink choices. Knowing the way food is cooked and what is included with the meal can help you eat fewer calories. ? If calories are listed on the menu, choose the lower-calorie options. ? Choose dishes that include vegetables, fruits, whole grains, low-fat dairy products, and lean proteins. ? Choose items that are boiled, broiled, grilled, or steamed. Avoid items that are buttered, battered, fried, or served with cream sauce. Items labeled as crispy are usually fried, unless stated otherwise. ? Choose water, low-fat milk, unsweetened iced tea, or other drinks without added sugar. If you want an alcoholic beverage, choose a lower-calorie option, such as a glass of wine or light beer. ? Ask for dressings, sauces, and syrups on the side. These are usually high in calories, so you should limit the amount you eat. ? If you want a salad, choose a garden salad and ask for grilled meats. Avoid extra toppings such as bacon, cheese, or fried items. Ask for the dressing on the side, or ask for olive oil and vinegar or lemon to use as dressing.  Estimate how many servings of a food you are given. Knowing serving sizes will help you be aware of how much food you are eating at restaurants. Where to find more information  Centers for Disease Control and Prevention: FootballExhibition.com.br  U.S. Department of Agriculture: WrestlingReporter.dk Summary  Calorie counting means keeping track of how many calories you eat and drink each day. If you eat fewer calories than your body needs, you should lose weight.  A healthy amount of weight to lose per week is usually 1-2 lb (0.5-0.9 kg). This usually means reducing your daily calorie intake by 500-750 calories.  The number of calories in a food can be found on a Nutrition Facts  label. If a food does not have a Nutrition Facts label, try to look up the calories online or ask your dietitian for help.  Use smaller plates, glasses, and bowls for smaller portions and to prevent overeating.  Use your calories on foods and drinks that will fill you up and not leave you hungry shortly after a meal. This information is not intended to replace advice given to you by your health care provider. Make sure you discuss any questions you have with your health care provider. Document Revised: 06/21/2019 Document Reviewed: 06/21/2019 Elsevier Patient Education  2021 [employer].   The patient verbalized understanding of instructions provided today and agreed to receive a mailed copy of patient instruction and/or educational materials.  Telephone follow up appointment with Managed Medicaid care management team member scheduled for: 09/15/20 @ 9am  Estanislado Emms RN, BSN [place]  [facility] RN Care Coordinator   Following is a copy of your plan of care:  Patient Care Plan: General Plan of Care (Adult)    Problem  Identified: Health Promotion or Disease Self-Management (General Plan of Care)     Long-Range Goal: Self-Management Plan Developed   Start Date: 08/14/2020  Expected End Date: 10/15/2020  This Visit's Progress: On track  Note:   Current Barriers:   Ineffective Self Health Maintenance-Ms. Behanna has been managing her health until recently with worsening right shoulder pain. The pain has made it difficult to perform ADL and IADL. She has a friend who comes over everyday to assist her. She attends the pain clinic at [facility] and has an MRI referral from [facility]. She would like to work on weight loss-PCP has referred to [facility].  Unable to independently procure eye exam and transportation for medical appointments  Does not contact provider office for questions/concerns  Currently UNABLE TO independently self manage needs related to  chronic health conditions.   Knowledge Deficits related to short term plan for care coordination needs and long term plans for chronic disease management needs Nurse Case Manager Clinical Goal(s):   patient will work with care management team to address care coordination and chronic disease management needs related to Care Coordination   Interventions:   Evaluation of current treatment plan related to shoulder pain and patient's adherence to plan as established by provider.  Advised patient to call [facility] [phone] for an eye exam  Reviewed medications with patient and discussed taking as directed  Discussed plans with patient for ongoing care management follow up and provided patient with direct contact information for care management team  Pharmacy referral for medication management  Provided the contact information to medical transportation provided by [facility] [phone]  Provided education on nutrition Self Care Activities:  . Patient will self administer medications as prescribed . Patient will attend all scheduled provider appointments . Patient will call pharmacy for medication refills . Patient will attend church or other social activities . Patient will call provider office for new concerns or questions Patient Goals: - call [facility] [phone], to follow up on referral for weight management  - work with [facility] Pharmacist, Harrold Donath, for medication management - begin a notebook of services in my neighborhood or community - follow-up on any referrals for help I am given - make a list of family or friends that I can call  - call [facility] [phone] to schedule an eye exam - call [phone], 2-3 days before your appointment to arrange medical transportation provided by [facility] - arrange a ride through an agency 1 week before appointment - ask family or friend for a ride - call to cancel if needed - keep a calendar with prescription refill dates -  keep a calendar with appointment dates  Follow Up Plan: Telephone follow up appointment with care management team member scheduled for:09/15/20 @ 9am

## 2020-08-14 NOTE — Telephone Encounter (Signed)
Copied from CRM [id number]. Topic: Referral - Request for Referral >> Aug 13, 2020 12:07 PM Crist Infante wrote: Has patient seen PCP for this complaint? No Pt states she saw another dr at [facility] clinic that advised her she needs to see an orthopedic.  Pt states she is having right shoulder pain.she would like referral to an orthopedic.    I asked pt several times if she had seen the dr for this issue.  Pt declined appt. Pt states Dr Laural Benes is aware she has a lot of issues. Pt says she cannot raise her right arm and waiting on appt for a mri.

## 2020-08-18 ENCOUNTER — Other Ambulatory Visit: Payer: Self-pay

## 2020-08-21 ENCOUNTER — Encounter: Payer: Self-pay | Admitting: Orthopedic Surgery

## 2020-08-21 ENCOUNTER — Ambulatory Visit (INDEPENDENT_AMBULATORY_CARE_PROVIDER_SITE_OTHER): Payer: Medicaid Other

## 2020-08-21 ENCOUNTER — Ambulatory Visit (INDEPENDENT_AMBULATORY_CARE_PROVIDER_SITE_OTHER): Payer: Medicaid Other | Admitting: Orthopedic Surgery

## 2020-08-21 ENCOUNTER — Ambulatory Visit: Payer: Self-pay

## 2020-08-21 DIAGNOSIS — M25512 Pain in left shoulder: Secondary | ICD-10-CM | POA: Diagnosis not present

## 2020-08-21 DIAGNOSIS — M25511 Pain in right shoulder: Secondary | ICD-10-CM | POA: Diagnosis not present

## 2020-08-21 NOTE — Progress Notes (Signed)
Office Visit Note   Patient: Rachel Hudson           Date of Birth: 01/25/1961           MRN: [id number] Visit Date: 08/21/2020              Requested by: Marcine Matar, MD [address] [place],  [place] [address] PCP: Marcine Matar, MD  Chief Complaint  Patient presents with  . Right Shoulder - Pain  . Left Shoulder - Pain      HPI: Patient is a 60 year old woman who presents complaining of bilateral shoulder pain right worse than left.  Patient states that she currently has undergone hand surgery with Dr. Merlyn Lot and is following up with neurosurgery with an MRI scan of her cervical spine.  Assessment & Plan: Visit Diagnoses:  1. Right shoulder pain, unspecified chronicity   2. Left shoulder pain, unspecified chronicity     Plan: Will set up appointment physical therapy for scapular stabilization and internal/external rotation strengthening for her shoulders.  Reevaluate in 4 weeks.  Discussed the possibility of a subacromial injection.  Patient states she may also try acupuncture.  Follow-Up Instructions: No follow-ups on file.   Ortho Exam  Patient is alert, oriented, no adenopathy, well-dressed, normal affect, normal respiratory effort. Examination patient has full range of motion of both shoulders she has increased pain with range of motion of the right shoulder.  She has pain to palpation of the biceps tendon as well as pain with Neer and Hawkins impingement test on the right.  The [facility] joint is not tender to palpation.  Imaging: XR Shoulder Left  Result Date: 08/21/2020 2 view radiographs of the left shoulder shows normal glenohumeral alignment with no bony spurs no subcondylar sclerosis or cysts.  XR Shoulder Right  Result Date: 08/21/2020 2 view radiographs of the right shoulder shows normal glenohumeral alignment no bony spurs no subcondylar cysts or sclerosis.  No images are attached to the encounter.  Labs: Lab Results  Component Value Date    HGBA1C 5.4 04/06/2017   HGBA1C 5.7 (H) 09/28/2013   ESRSEDRATE 35 07/24/2018   LABURIC 4.1 04/13/2016   REPTSTATUS 05/03/2015 FINAL 04/30/2015   GRAMSTAIN  04/30/2015    ABUNDANT WBC PRESENT,BOTH PMN AND MONONUCLEAR NO ORGANISMS SEEN Performed at [facility]    CULT  04/30/2015    ABUNDANT ENTEROBACTER AEROGENES Performed at [facility]    LABORGA ENTEROBACTER AEROGENES 04/30/2015     Lab Results  Component Value Date   ALBUMIN 4.2 08/14/2019   ALBUMIN 4.5 03/02/2018   ALBUMIN 4.1 09/06/2017    No results found for: MG No results found for: VD25OH  No results found for: PREALBUMIN CBC EXTENDED Latest Ref Rng & Units 08/14/2019 03/02/2018 09/06/2017  WBC 4.0 - 10.5 K/uL 7.5 5.9 11.7(H)  RBC 3.87 - 5.11 MIL/uL 4.67 4.51 4.38  HGB 12.0 - 15.0 g/dL 49.6 75.9 16.3  HCT 84.6 - 46.0 % 41.6 41.2 39.2  PLT 150 - 400 K/uL 190 151 176  NEUTROABS 1.7 - 7.7 K/uL - 2.7 7.1  LYMPHSABS 0.7 - 4.0 K/uL - 2.5 3.7     There is no height or weight on file to calculate BMI.  Orders:  Orders Placed This Encounter  Procedures  . XR Shoulder Right  . XR Shoulder Left   No orders of the defined types were placed in this encounter.    Procedures: No procedures performed  Clinical Data:  No additional findings.  ROS:  All other systems negative, except as noted in the HPI. Review of Systems  Objective: Vital Signs: LMP 09/29/2016 (Approximate)   Specialty Comments:  No specialty comments available.  PMFS History: Patient Active Problem List   Diagnosis Date Noted  . Influenza vaccine needed 02/26/2020  . Bipolar affective disorder, currently manic, mild (HCC) 09/12/2017  . Dysphagia 08/24/2017  . Neuropathy of both feet 04/05/2017  . Status post lumbar spinal fusion 10/22/2016  . GERD (gastroesophageal reflux disease) 08/09/2016  . Spondylolisthesis of lumbar region 07/29/2016  . Sinus bradycardia 06/15/2016  . Gingivitis 06/15/2016  . ASCUS favor  benign 11/13/2015  . History of hepatitis C 10/13/2015  . Bilateral carpal tunnel syndrome 09/16/2015  . Cervical spondylosis without myelopathy 09/16/2015  . Primary osteoarthritis of both hands 09/16/2015  . Diverticulosis of colon without hemorrhage   . Peptic ulcer disease   . 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 Lumbago 11/18/2014  . Carpal tunnel syndrome 11/18/2014  . Hepatic cirrhosis (HCC) 08/06/2014  . HTN (hypertension) 02/28/2014   Past Medical History:  Diagnosis Date  . Anxiety   . Arthritis   . Cirrhosis of liver [facility]) September 2015   Stage 4  . GERD (gastroesophageal reflux disease)   . Gout   . Hep C w/o coma, chronic (HCC) As of 10/22/13  . Hypertension     Family History  Adopted: Yes  Problem Relation Age of Onset  . Colon cancer Neg Hx     Past Surgical History:  Procedure Laterality Date  . BACK SURGERY     lumbar fusion  . BIOPSY  09/12/2017   Procedure: BIOPSY;  Surgeon: Corbin Ade, MD;  [facility];  Service: Endoscopy;;  gastric  . CARPAL TUNNEL RELEASE Right 11/13/2015   Procedure: RIGHT CARPAL TUNNEL RELEASE;  Surgeon: Betha Loa, MD;  Location: [facility];  Service: Orthopedics;  Laterality: Right;  . CARPAL TUNNEL RELEASE Left 01/29/2016   Procedure: left CARPAL TUNNEL RELEASE;  Surgeon: Betha Loa, MD;  Location: [facility];  Service: Orthopedics;  Laterality: Left;  . COLONOSCOPY N/A 07/08/2015   Procedure: COLONOSCOPY;  Surgeon: Corbin Ade, MD;  [facility];  Service: Endoscopy;  Laterality: N/A;  230  . ESOPHAGOGASTRODUODENOSCOPY N/A 07/08/2015   Procedure: ESOPHAGOGASTRODUODENOSCOPY (EGD);  Surgeon: Corbin Ade, MD;  [facility];  Service: Endoscopy;  Laterality: N/A;  . ESOPHAGOGASTRODUODENOSCOPY (EGD) WITH PROPOFOL N/A 09/12/2017   Procedure: ESOPHAGOGASTRODUODENOSCOPY (EGD) WITH PROPOFOL;  Surgeon: Corbin Ade, MD;   [facility];  Service: Endoscopy;  Laterality: N/A;  9:30am  . ESOPHAGOGASTRODUODENOSCOPY (EGD) WITH PROPOFOL N/A 10/25/2019   Procedure: ESOPHAGOGASTRODUODENOSCOPY (EGD) WITH PROPOFOL;  Surgeon: Corbin Ade, MD;  [facility];  Service: Endoscopy;  Laterality: N/A;  10:30am  . FINGER ARTHROSCOPY WITH CARPOMETACARPEL (CMC) ARTHROPLASTY Right 04/24/2020   Procedure: RIGHT INDEX FINGER METACARPAL PHALANGEAL ARTHROPLASTY;  Surgeon: Betha Loa, MD;  Location: [facility];  Service: Orthopedics;  Laterality: Right;  . LIGAMENT REPAIR Right 1980   Knee  . MALONEY DILATION N/A 09/12/2017   Procedure: Elease Hashimoto DILATION;  Surgeon: Corbin Ade, MD;  [facility];  Service: Endoscopy;  Laterality: N/A;  . Elease Hashimoto DILATION N/A 10/25/2019   Procedure: Elease Hashimoto DILATION;  Surgeon: Corbin Ade, MD;  [facility];  Service: Endoscopy;  Laterality: N/A;  . TRIGGER FINGER RELEASE Right  04/24/2020   Procedure: RIGHT LONG TRIGGER FINGER RELEASE TRIGGER FINGER/A-1 PULLEY;  Surgeon: Betha Loa, MD;  Location: [facility];  Service: Orthopedics;  Laterality: Right;   Social History   Occupational History  . Not on file  Tobacco Use  . Smoking status: Never Smoker  . Smokeless tobacco: Never Used  Vaping Use  . Vaping Use: Never used  Substance and Sexual Activity  . Alcohol use: Yes    Alcohol/week: 0.0 standard drinks    Comment: has been drinking once/week since October  . Drug use: Yes    Frequency: 7.0 times per week    Types: Marijuana    Comment: daily for arthritis  . Sexual activity: Not Currently    Birth control/protection: None

## 2020-08-28 ENCOUNTER — Other Ambulatory Visit: Payer: Self-pay | Admitting: Internal Medicine

## 2020-08-28 ENCOUNTER — Ambulatory Visit: Payer: Self-pay | Admitting: *Deleted

## 2020-08-28 DIAGNOSIS — I10 Essential (primary) hypertension: Secondary | ICD-10-CM

## 2020-08-28 MED ORDER — AMLODIPINE BESYLATE 10 MG PO TABS: 1.0000 | ORAL_TABLET | Freq: Every day | ORAL | 0 refills | Status: DC

## 2020-08-28 MED ORDER — MITIGARE 0.6 MG PO CAPS: 1.0000 | ORAL_CAPSULE | Freq: Two times a day (BID) | ORAL | 0 refills | Status: DC

## 2020-08-28 NOTE — Telephone Encounter (Signed)
Requested medication (s) are due for refill today: yes  Requested medication (s) are on the active medication list: yes  Future visit scheduled: no  Notes to clinic: courtesy refill has been given and no appt has been scheduled   Requested Prescriptions  Pending Prescriptions Disp Refills   MITIGARE 0.6 MG CAPS [Pharmacy Med Name: Mitigare 0.6 MG Oral Capsule] 60 capsule 0    Sig: Take 1 capsule by mouth twice daily      Endocrinology:  Gout Agents Failed - 08/28/2020  3:01 PM      Failed - Uric Acid in normal range and within 360 days    Uric Acid, Serum  Date Value Ref Range Status  04/13/2016 4.1 2.5 - 7.0 mg/dL Final          Failed - Cr in normal range and within 360 days    Creat  Date Value Ref Range Status  06/15/2016 0.76 0.50 - 1.05 mg/dL Final    Comment:      For patients > or = 59 years of age: The upper reference limit for Creatinine is approximately 13% higher for people identified as African-American.      Creatinine, Ser  Date Value Ref Range Status  08/14/2019 0.79 0.44 - 1.00 mg/dL Final          Passed - Valid encounter within last 12 months    Recent Outpatient Visits           6 months ago Need for influenza vaccination   [facility] Lois Huxley, Cornelius Moras, RPH-CPP   6 months ago Essential hypertension   [facility] Marcine Matar, MD   10 months ago Annual physical exam   [facility] Marcine Matar, MD   1 year ago Essential hypertension   [facility] Marcine Matar, MD   1 year ago Primary osteoarthritis of both hands   [facility] Marcine Matar, MD       Future Appointments             In 3 weeks Nadara Mustard, MD [facility] [place]               amLODipine (NORVASC) 10 MG tablet [Pharmacy Med Name: amLODIPine Besylate 10 MG Oral Tablet] 30 tablet 0     Sig: TAKE 1 TABLET BY MOUTH ONCE DAILY . APPOINTMENT REQUIRED FOR FUTURE REFILLS      Cardiovascular:  Calcium Channel Blockers Failed - 08/28/2020  3:01 PM      Failed - Valid encounter within last 6 months    Recent Outpatient Visits           6 months ago Need for influenza vaccination   [facility] Lois Huxley, Cornelius Moras, RPH-CPP   6 months ago Essential hypertension   [facility] Marcine Matar, MD   10 months ago Annual physical exam   [facility] Marcine Matar, MD   1 year ago Essential hypertension   [facility] Marcine Matar, MD   1 year ago Primary osteoarthritis of both hands   [facility] Marcine Matar, MD       Future Appointments             In 3 weeks  Nadara Mustard, MD [facility] - Last BP in normal range    BP Readings from Last 1 Encounters:  08/04/20 124/74

## 2020-08-28 NOTE — Telephone Encounter (Signed)
Patient requesting refill for norvasc and mitgare. No valid encounter with in 6 months. Called patient to schedule appt for 10/30/20 at 3:50 pm. Patient requesting letter to be sent as a reminder for appt in June. Will follow protocol and give enough meds until next appt. Courtesy refill given. Patient verbalized understanding why she was refused refills , due to not scheduling future appt . Reminded patient not to miss appt in June.  Reason for Disposition . [1] Caller requesting a prescription renewal (no refills left), no triage required, AND [2] triager able to renew prescription per department policy  Answer Assessment - Initial Assessment Questions 1. DRUG NAME: "What medicine do you need to have refilled?"     norvasc and mitgare 2. REFILLS REMAINING: "How many refills are remaining?" (Note: The label on the medicine or pill bottle will show how many refills are remaining. If there are no refills remaining, then a renewal may be needed.)     na 3. EXPIRATION DATE: "What is the expiration date?" (Note: The label states when the prescription will expire, and thus can no longer be refilled.)     na 4. PRESCRIBING HCP: "Who prescribed it?" Reason: If prescribed by specialist, call should be referred to that group.     PCP 5. SYMPTOMS: "Do you have any symptoms?"     gout 6. PREGNANCY: "Is there any chance that you are pregnant?" "When was your last menstrual period?"     na  Protocols used: MEDICATION REFILL AND RENEWAL CALL-A-AH

## 2020-08-28 NOTE — Telephone Encounter (Signed)
Pls forward to Dr. Laural Benes if needed.

## 2020-08-29 NOTE — Telephone Encounter (Signed)
Will forward to provider  

## 2020-09-01 ENCOUNTER — Encounter: Payer: Self-pay | Admitting: Internal Medicine

## 2020-09-11 DIAGNOSIS — Z79899 Other long term (current) drug therapy: Secondary | ICD-10-CM | POA: Diagnosis not present

## 2020-09-15 ENCOUNTER — Other Ambulatory Visit: Payer: Self-pay | Admitting: *Deleted

## 2020-09-15 NOTE — Patient Instructions (Signed)
Visit Information  Ms. Rachel Hudson  - as a part of your Medicaid benefit, you are eligible for care management and care coordination services at no cost or copay. I was unable to reach you by phone today but would be happy to help you with your health related needs. Please feel free to call me @ [phone].   A member of the Managed Medicaid care management team will reach out to you again over the next 7-14 days.   Estanislado Emms RN, BSN   [employer] [employer]

## 2020-09-15 NOTE — Patient Outreach (Signed)
Care Coordination  09/15/2020  Rachel Hudson 08-20-1960 [id number]    Medicaid Managed Care   Unsuccessful Outreach Note  09/15/2020 Name: Rachel Hudson MRN: [id number] DOB: 09-Jul-1960  Referred by: Marcine Matar, MD Reason for referral : High Risk Managed Medicaid (Unsuccessful RNCM follow up outreach)   An unsuccessful telephone outreach was attempted today. The patient was referred to the case management team for assistance with care management and care coordination.   Follow Up Plan: A HIPAA compliant phone message was left for the patient providing contact information and requesting a return call.  The care management team will reach out to the patient again over the next 7-14 days.   Estanislado Emms RN, BSN [place]  [employer] [employer]

## 2020-09-16 ENCOUNTER — Ambulatory Visit: Payer: Medicaid Other

## 2020-09-18 ENCOUNTER — Ambulatory Visit: Payer: Medicaid Other | Admitting: Orthopedic Surgery

## 2020-10-09 DIAGNOSIS — Z79899 Other long term (current) drug therapy: Secondary | ICD-10-CM | POA: Diagnosis not present

## 2020-10-09 DIAGNOSIS — Z9889 Other specified postprocedural states: Secondary | ICD-10-CM | POA: Diagnosis not present

## 2020-10-09 DIAGNOSIS — Z96691 Finger-joint replacement of right hand: Secondary | ICD-10-CM | POA: Diagnosis not present

## 2020-10-09 DIAGNOSIS — Z471 Aftercare following joint replacement surgery: Secondary | ICD-10-CM | POA: Diagnosis not present

## 2020-10-09 DIAGNOSIS — R202 Paresthesia of skin: Secondary | ICD-10-CM | POA: Diagnosis not present

## 2020-10-15 ENCOUNTER — Encounter (INDEPENDENT_AMBULATORY_CARE_PROVIDER_SITE_OTHER): Payer: Self-pay | Admitting: Family Medicine

## 2020-10-15 ENCOUNTER — Ambulatory Visit (INDEPENDENT_AMBULATORY_CARE_PROVIDER_SITE_OTHER): Payer: Medicaid Other | Admitting: Family Medicine

## 2020-10-15 ENCOUNTER — Other Ambulatory Visit: Payer: Self-pay

## 2020-10-15 VITALS — BP 104/62 | HR 60 | Temp 98.1°F | Ht 60.0 in | Wt 166.0 lb

## 2020-10-15 DIAGNOSIS — Z6832 Body mass index (BMI) 32.0-32.9, adult: Secondary | ICD-10-CM | POA: Diagnosis not present

## 2020-10-15 DIAGNOSIS — E7849 Other hyperlipidemia: Secondary | ICD-10-CM | POA: Diagnosis not present

## 2020-10-15 DIAGNOSIS — F419 Anxiety disorder, unspecified: Secondary | ICD-10-CM | POA: Diagnosis not present

## 2020-10-15 DIAGNOSIS — R5383 Other fatigue: Secondary | ICD-10-CM

## 2020-10-15 DIAGNOSIS — K7460 Unspecified cirrhosis of liver: Secondary | ICD-10-CM

## 2020-10-15 DIAGNOSIS — I10 Essential (primary) hypertension: Secondary | ICD-10-CM | POA: Diagnosis not present

## 2020-10-15 DIAGNOSIS — E669 Obesity, unspecified: Secondary | ICD-10-CM

## 2020-10-15 DIAGNOSIS — R0602 Shortness of breath: Secondary | ICD-10-CM

## 2020-10-15 DIAGNOSIS — Z0289 Encounter for other administrative examinations: Secondary | ICD-10-CM

## 2020-10-15 DIAGNOSIS — E559 Vitamin D deficiency, unspecified: Secondary | ICD-10-CM

## 2020-10-15 DIAGNOSIS — R739 Hyperglycemia, unspecified: Secondary | ICD-10-CM

## 2020-10-15 DIAGNOSIS — Z1331 Encounter for screening for depression: Secondary | ICD-10-CM

## 2020-10-16 LAB — LIPID PANEL WITH LDL/HDL RATIO
Cholesterol, Total: 216 mg/dL — ABNORMAL HIGH (ref 100–199)
HDL: 51 mg/dL (ref >39)
LDL Chol Calc (NIH): 151 mg/dL — ABNORMAL HIGH (ref 0–99)
LDL/HDL Ratio: 3 ratio (ref 0.0–3.2)
Triglycerides: 78 mg/dL (ref 0–149)
VLDL Cholesterol Cal: 14 mg/dL (ref 5–40)

## 2020-10-16 LAB — COMPREHENSIVE METABOLIC PANEL
ALT: 18 IU/L (ref 0–32)
AST: 18 IU/L (ref 0–40)
Albumin/Globulin Ratio: 1.7 (ref 1.2–2.2)
Albumin: 4.8 g/dL (ref 3.8–4.9)
Alkaline Phosphatase: 88 IU/L (ref 44–121)
BUN/Creatinine Ratio: 21 (ref 9–23)
BUN: 15 mg/dL (ref 6–24)
Bilirubin Total: 0.4 mg/dL (ref 0.0–1.2)
CO2: 23 mmol/L (ref 20–29)
Calcium: 9.6 mg/dL (ref 8.7–10.2)
Chloride: 101 mmol/L (ref 96–106)
Creatinine, Ser: 0.7 mg/dL (ref 0.57–1.00)
Globulin, Total: 2.8 g/dL (ref 1.5–4.5)
Glucose: 86 mg/dL (ref 65–99)
Potassium: 4.4 mmol/L (ref 3.5–5.2)
Sodium: 139 mmol/L (ref 134–144)
Total Protein: 7.6 g/dL (ref 6.0–8.5)
eGFR: 100 mL/min/{1.73_m2} (ref >59)

## 2020-10-16 LAB — CBC WITH DIFFERENTIAL/PLATELET
Basophils Absolute: 0 10*3/uL (ref 0.0–0.2)
Basos: 1 %
EOS (ABSOLUTE): 0.2 10*3/uL (ref 0.0–0.4)
Eos: 2 %
Hematocrit: 43.4 % (ref 34.0–46.6)
Hemoglobin: 14.3 g/dL (ref 11.1–15.9)
Immature Grans (Abs): 0 10*3/uL (ref 0.0–0.1)
Immature Granulocytes: 0 %
Lymphocytes Absolute: 2.6 10*3/uL (ref 0.7–3.1)
Lymphs: 36 %
MCH: 29.9 pg (ref 26.6–33.0)
MCHC: 32.9 g/dL (ref 31.5–35.7)
MCV: 91 fL (ref 79–97)
Monocytes Absolute: 0.7 10*3/uL (ref 0.1–0.9)
Monocytes: 9 %
Neutrophils Absolute: 3.7 10*3/uL (ref 1.4–7.0)
Neutrophils: 52 %
Platelets: 205 10*3/uL (ref 150–450)
RBC: 4.79 x10E6/uL (ref 3.77–5.28)
RDW: 13.4 % (ref 11.7–15.4)
WBC: 7.2 10*3/uL (ref 3.4–10.8)

## 2020-10-16 LAB — TSH: TSH: 1.64 u[IU]/mL (ref 0.450–4.500)

## 2020-10-16 LAB — HEMOGLOBIN A1C
Est. average glucose Bld gHb Est-mCnc: 111 mg/dL
Hgb A1c MFr Bld: 5.5 % (ref 4.8–5.6)

## 2020-10-16 LAB — INSULIN, RANDOM: INSULIN: 7.1 u[IU]/mL (ref 2.6–24.9)

## 2020-10-16 LAB — VITAMIN D 25 HYDROXY (VIT D DEFICIENCY, FRACTURES): Vit D, 25-Hydroxy: 60.8 ng/mL (ref 30.0–100.0)

## 2020-10-16 LAB — T4: T4, Total: 7.6 ug/dL (ref 4.5–12.0)

## 2020-10-16 LAB — T3: T3, Total: 123 ng/dL (ref 71–180)

## 2020-10-23 ENCOUNTER — Other Ambulatory Visit: Payer: Self-pay | Admitting: Internal Medicine

## 2020-10-23 DIAGNOSIS — M19042 Primary osteoarthritis, left hand: Secondary | ICD-10-CM

## 2020-10-23 DIAGNOSIS — I10 Essential (primary) hypertension: Secondary | ICD-10-CM

## 2020-10-23 DIAGNOSIS — M19041 Primary osteoarthritis, right hand: Secondary | ICD-10-CM

## 2020-10-23 NOTE — Telephone Encounter (Signed)
Courtesy refill. Future visit in 1 week. 

## 2020-10-23 NOTE — Progress Notes (Signed)
Dear Dr. Laural Benes,   Thank you for referring Rachel Hudson to our clinic. The following note includes my evaluation and treatment recommendations.  Chief Complaint:   OBESITY Rachel Hudson (MR# [id number]) is a 60 y.o. female who presents for evaluation and treatment of obesity and related comorbidities. Current BMI is Body mass index is 32.42 kg/m. Rachel Hudson has been struggling with her weight for many years and has been unsuccessful in either losing weight, maintaining weight loss, or reaching her healthy weight goal.  Rachel Hudson is currently in the action stage of change and ready to dedicate time achieving and maintaining a healthier weight. Rachel Hudson is interested in becoming our patient and working on intensive lifestyle modifications including (but not limited to) diet and exercise for weight loss.  Rachel Hudson was referred by Dr. Laural Benes. She eats out occasionally. Skips breakfast frequently or may drink Atkins shake; Lunch- burger or [employer] chicken (eats 1/2) salad sandwich or wings (3 wings); ~10-11 PM chicken noodle soup; Drinks water throughout the day; sugar free Gatorade.  Susi's habits were reviewed today and are as follows: her desired weight loss is 18 lbs and she is frequently drinking liquids with calories.  Depression Screen Rachel Hudson Food and Mood (modified PHQ-9) score was 0.  Depression screen Rachel Hudson 2/9 10/15/2020  Decreased Interest 0  Down, Depressed, Hopeless 0  PHQ - 2 Score 0  Altered sleeping 0  Tired, decreased energy 0  Change in appetite 0  Feeling bad or failure about yourself  0  Trouble concentrating 0  Moving slowly or fidgety/restless 0  Suicidal thoughts 0  PHQ-9 Score 0  Some recent data might be hidden   Subjective:   1. Other fatigue Rachel Hudson admits to daytime somnolence and denies waking up still tired. Patent has a history of symptoms of daytime fatigue. Rachel Hudson generally gets 6 or 7 hours of sleep per night, and states that she has poor  sleep quality. Snoring is present. Apneic episodes are not present. Epworth Sleepiness Score is 7. EKG normal sinus rhythm at 55 bpm.  2. Shortness of breath on exertion Rachel Hudson notes increasing shortness of breath with exercising and seems to be worsening over time with weight gain. She notes getting out of breath sooner with activity than she used to. This has gotten worse recently. Brittannie denies shortness of breath at rest or orthopnea. EKG normal sinus rhythm at 55 bpm.  3. Hyperglycemia Rachel Hudson has had elevated BS in the past. She has no history of insulin resistance, pre-diabetes.  4. Primary hypertension BP controlled today. Rachel Hudson is on amlodipine 10 mg daily.  Rachel Hudson denies chest pain/chest pressure/headache/dizziness/lightheadedness. She was diagnosed ~20 years ago.  5. Vitamin D deficiency Rachel Hudson is on once a week supplement. Her last Vit D level is unknown.  6. Cirrhosis of liver without ascites, unspecified hepatic cirrhosis type (HCC) History of Hep C and alcoholism. Rachel Hudson was treated for Hep C with clearance.  7. Other hyperlipidemia Diagnosed ~6 months ago. Rachel Hudson has an LDL of 145, HDL 42, triglycerides 152.  She is not on statin therapy.  8. Anxiety Rachel Hudson stopped seeing psychiatrist. Pt denies suicidal or homicidal ideations.   Assessment/Plan:   1. Other fatigue Shavontae does feel that her weight is causing her energy to be lower than it should be. Fatigue may be related to obesity, depression or many other causes. Labs will be ordered, and in the meanwhile, Tanaiya will focus on self care including making healthy food choices, increasing physical activity and  focusing on stress reduction. Check labs today.  - EKG 12-Lead - T3 - T4 - TSH  2. Shortness of breath on exertion Rachel Hudson does feel that she gets out of breath more easily that she used to when she exercises. Onyinyechi's shortness of breath appears to be obesity related and exercise induced. She  has agreed to work on weight loss and gradually increase exercise to treat her exercise induced shortness of breath. Will continue to monitor closely. Check labs today.  - CBC with Differential/Platelet  3. Hyperglycemia Fasting labs will be obtained and results with be discussed with Rachel Hudson in 2 weeks at her follow up visit. In the meanwhile Rachel Hudson was started on a lower simple carbohydrate diet and will work on weight loss efforts.  - Hemoglobin A1c - Insulin, random  4. Primary hypertension Rachel Hudson is working on healthy weight loss and exercise to improve blood pressure control. We will watch for signs of hypotension as she continues her lifestyle modifications. -EKG, CMP, FLP  5. Vitamin D deficiency Low Vitamin D level contributes to fatigue and are associated with obesity, breast, and colon cancer. She agrees to continue to take prescription Vitamin D @50 ,000 IU every week and will follow-up for routine testing of Vitamin D, at least 2-3 times per year to avoid over-replacement. - VITAMIN D 25 Hydroxy (Vit-D Deficiency, Fractures)  6. Cirrhosis of liver without ascites, unspecified hepatic cirrhosis type (HCC) CMP today - Comprehensive metabolic panel  7. Other hyperlipidemia Cardiovascular risk and specific lipid/LDL goals reviewed.  We discussed several lifestyle modifications today and Rachel Hudson will continue to work on diet, exercise and weight loss efforts. Orders and follow up as documented in patient record.   Counseling Intensive lifestyle modifications are the first line treatment for this issue. . Dietary changes: Increase soluble fiber. Decrease simple carbohydrates. . Exercise changes: Moderate to vigorous-intensity aerobic activity 150 minutes per week if tolerated. . Lipid-lowering medications: see documented in medical record.  - Lipid Panel With LDL/HDL Ratio  8. Anxiety Behavior modification techniques were discussed today to help Rachel Hudson deal with her  anxiety.  Orders and follow up as documented in patient record.  -Follow up with pt at next appt.  9. Screening for depression Rachel Hudson had a negative depression screening. Depression is commonly associated with obesity and often results in emotional eating behaviors. We will monitor this closely and work on CBT to help improve the non-hunger eating patterns. Referral to Psychology may be required if no improvement is seen as she continues in our clinic.  10. Class 1 obesity with serious comorbidity and body mass index (BMI) of 32.0 to 32.9 in adult, unspecified obesity type Rachel Hudson is currently in the action stage of change and her goal is to continue with weight loss efforts. I recommend Rachel Hudson begin the structured treatment plan as follows:  She has agreed to the Category 2 Plan + 100 calories.  Exercise goals: No exercise has been prescribed at this time.   Behavioral modification strategies: increasing lean protein intake, meal planning and cooking strategies, keeping healthy foods in the home and planning for success.  She was informed of the importance of frequent follow-up visits to maximize her success with intensive lifestyle modifications for her multiple health conditions. She was informed we would discuss her lab results at her next visit unless there is a critical issue that needs to be addressed sooner. Rachel Hudson agreed to keep her next visit at the agreed upon time to discuss these results.  Objective:  Blood pressure 104/62, pulse 60, temperature 98.1 F (36.7 C), height 5' (1.524 m), weight 166 lb (75.3 kg), last menstrual period 09/29/2016, SpO2 98 %. Body mass index is 32.42 kg/m.  EKG: Normal sinus rhythm, rate 55.  Indirect Calorimeter completed today shows a VO2 of 253 and a REE of 1762.  Her calculated basal metabolic rate is 6546 thus her basal metabolic rate is better than expected.  General: Cooperative, alert, well developed, in no acute distress. HEENT:  Conjunctivae and lids unremarkable. Cardiovascular: Regular rhythm.  Lungs: Normal work of breathing. Neurologic: No focal deficits.   Lab Results  Component Value Date   CREATININE 0.70 10/15/2020   BUN 15 10/15/2020   NA 139 10/15/2020   K 4.4 10/15/2020   CL 101 10/15/2020   CO2 23 10/15/2020   Lab Results  Component Value Date   ALT 18 10/15/2020   AST 18 10/15/2020   ALKPHOS 88 10/15/2020   BILITOT 0.4 10/15/2020   Lab Results  Component Value Date   HGBA1C 5.5 10/15/2020   HGBA1C 5.4 04/06/2017   HGBA1C 5.7 (H) 09/28/2013   Lab Results  Component Value Date   INSULIN 7.1 10/15/2020   Lab Results  Component Value Date   TSH 1.640 10/15/2020   Lab Results  Component Value Date   CHOL 216 (H) 10/15/2020   HDL 51 10/15/2020   LDLCALC 151 (H) 10/15/2020   TRIG 78 10/15/2020   CHOLHDL 5.1 (H) 10/26/2019   Lab Results  Component Value Date   WBC 7.2 10/15/2020   HGB 14.3 10/15/2020   HCT 43.4 10/15/2020   MCV 91 10/15/2020   PLT 205 10/15/2020   Lab Results  Component Value Date   IRON 356 (H) 12/31/2013    Attestation Statements:   Reviewed by clinician on day of visit: allergies, medications, problem list, medical history, surgical history, family history, social history, and previous encounter notes.  Edmund Hilda, CMA, am acting as transcriptionist for Reuben Likes, MD.  This is the patient's first visit at [facility]. The patient's NEW PATIENT PACKET was reviewed at length. Included in the packet: current and past health history, medications, allergies, ROS, gynecologic history (women only), surgical history, family history, social history, weight history, weight loss surgery history (for those that have had weight loss surgery), nutritional evaluation, mood and food questionnaire, PHQ9, Epworth questionnaire, sleep habits questionnaire, patient life and health improvement goals questionnaire. These will all be scanned into  the patient's chart under media.   During the visit, I independently reviewed the patient's EKG, bioimpedance scale results, and indirect calorimeter results. I used this information to tailor a meal plan for the patient that will help her to lose weight and will improve her obesity-related conditions going forward. I performed a medically necessary appropriate examination and/or evaluation. I discussed the assessment and treatment plan with the patient. The patient was provided an opportunity to ask questions and all were answered. The patient agreed with the plan and demonstrated an understanding of the instructions. Labs were ordered at this visit and will be reviewed at the next visit unless more critical results need to be addressed immediately. Clinical information was updated and documented in the EMR.   Time spent on visit including pre-visit chart review and post-visit care was 60 minutes.   A separate 15 minutes was spent on risk counseling (see above).  I have reviewed the above documentation for accuracy and completeness, and I agree with the above. Mackie Pai  Clovis Cao, MD

## 2020-10-29 ENCOUNTER — Ambulatory Visit (INDEPENDENT_AMBULATORY_CARE_PROVIDER_SITE_OTHER): Payer: Medicaid Other | Admitting: Family Medicine

## 2020-10-30 ENCOUNTER — Other Ambulatory Visit: Payer: Self-pay

## 2020-10-30 ENCOUNTER — Ambulatory Visit (HOSPITAL_BASED_OUTPATIENT_CLINIC_OR_DEPARTMENT_OTHER): Payer: Medicaid Other | Admitting: Internal Medicine

## 2020-10-30 ENCOUNTER — Encounter: Payer: Self-pay | Admitting: Internal Medicine

## 2020-10-30 VITALS — BP 119/72 | HR 64 | Resp 16 | Wt 168.4 lb

## 2020-10-30 DIAGNOSIS — Z5321 Procedure and treatment not carried out due to patient leaving prior to being seen by health care provider: Secondary | ICD-10-CM

## 2020-11-03 ENCOUNTER — Ambulatory Visit (INDEPENDENT_AMBULATORY_CARE_PROVIDER_SITE_OTHER): Payer: Medicaid Other | Admitting: Family Medicine

## 2020-11-03 ENCOUNTER — Ambulatory Visit: Payer: Medicaid Other | Attending: Internal Medicine | Admitting: Internal Medicine

## 2020-11-03 ENCOUNTER — Other Ambulatory Visit: Payer: Self-pay

## 2020-11-03 ENCOUNTER — Encounter: Payer: Self-pay | Admitting: Internal Medicine

## 2020-11-03 DIAGNOSIS — G5603 Carpal tunnel syndrome, bilateral upper limbs: Secondary | ICD-10-CM | POA: Diagnosis not present

## 2020-11-03 DIAGNOSIS — E669 Obesity, unspecified: Secondary | ICD-10-CM | POA: Diagnosis not present

## 2020-11-03 DIAGNOSIS — M47816 Spondylosis without myelopathy or radiculopathy, lumbar region: Secondary | ICD-10-CM | POA: Diagnosis not present

## 2020-11-03 DIAGNOSIS — E66811 Obesity, class 1: Secondary | ICD-10-CM

## 2020-11-03 DIAGNOSIS — E782 Mixed hyperlipidemia: Secondary | ICD-10-CM | POA: Diagnosis not present

## 2020-11-03 DIAGNOSIS — I10 Essential (primary) hypertension: Secondary | ICD-10-CM

## 2020-11-03 MED ORDER — GABAPENTIN 800 MG PO TABS: 800.0000 mg | ORAL_TABLET | Freq: Four times a day (QID) | ORAL | 3 refills | Status: DC

## 2020-11-03 NOTE — Progress Notes (Signed)
Virtual Visit via Telephone Note  I connected with Rachel Hudson on 11/03/2020 at 8:13 a.m by telephone and verified that I am speaking with the correct person using two identifiers  Location: Patient: home Provider: office  Participants: Myself Patient   I discussed the limitations, risks, security and privacy concerns of performing an evaluation and management service by telephone and the availability of in person appointments. I also discussed with the patient that there may be a patient responsible charge related to this service. The patient expressed understanding and agreed to proceed.   History of Present Illness: Patient with history of HTN, HL, hepatitis C (treated) with cirrhosis followed by GI, GERD and chronic lower back pain with hx surgery for spinal fixation of L4-L5, OA hands, bipolar affective ds, CTS hands.  Last evaluation 02/2020.  Purpose of today's visit is chronic disease management.   Patient had came in person last week but had to leave before being seen because she was on her way out of town.  Obesity: Since last visit with me she has established with medical weight management program.  Patient states that since she saw me her weight had increased to 182 but now down to 164 pounds.  Doing good with eating habits.  Does water aerobics in her pool every day for 15 to 20 minutes and also does some walking.  On recent labs, LDL noted to be 151.  HTN: I note that her blood pressure reading was 119/72 when she had came in on the ninth of this month.  Blood pressure was also at goal when she was seen at medical weight management on 10/15/2020.  She limits salt in the foods.  No chest pains or shortness of breath.  Taking Norvasc as prescribed.  Chronic pain with history of lumbar spondylosis: Receives pain management through [facility] pain clinic.  She is on oxycodone.  Confirms that she has a naloxone nasal kit at home.  Supposed to be on gabapentin 800 mg 1-1/2 tablets 3  times a day but she tells me she is actually taking 800 mg 2 tablets twice a day meaning she is taking 400 mg last a day than prescribed.  Outpatient Encounter Medications as of 11/03/2020  Medication Sig   amLODipine (NORVASC) 10 MG tablet Take 1 tablet by mouth once daily   celecoxib (CELEBREX) 200 MG capsule Take 1 capsule by mouth once daily   COLLAGEN PO Take 3 g by mouth daily.   dexlansoprazole (DEXILANT) 60 MG capsule Take 1 capsule (60 mg total) by mouth daily.   ergocalciferol (VITAMIN D2) 1.25 MG (50000 UT) capsule Take 1 capsule by mouth once a week.   Flax Oil-Fish Oil-Borage Oil (FISH-FLAX-BORAGE PO) Take 1 capsule by mouth daily.   gabapentin (NEURONTIN) 800 MG tablet TAKE 1 & 1/2 (ONE & ONE-HALF) TABLETS BY MOUTH THREE TIMES DAILY   Lecith-Inosi-Chol-B12-Liver (LIVERITE) 1.125 MCG TABS Take 1 tablet by mouth 2 (two) times daily. LIVER AID   milk thistle 175 MG tablet Take 175 mg by mouth daily.   MITIGARE 0.6 MG CAPS Take 1 capsule by mouth twice daily   Multiple Vitamin (MULTIVITAMIN WITH MINERALS) TABS tablet Take 1 tablet by mouth daily.   OVER THE COUNTER MEDICATION salompad   Oxycodone HCl 10 MG TABS Take 10 mg by mouth 5 (five) times daily.   Tetrahydrozoline HCl (VISINE OP) Place 1 drop into both eyes daily as needed (allergies).   No facility-administered encounter medications on file as of 11/03/2020.  Observations/Objective: Results for orders placed or performed in visit on 10/15/20  CBC with Differential/Platelet  Result Value Ref Range   WBC 7.2 3.4 - 10.8 x10E3/uL   RBC 4.79 3.77 - 5.28 x10E6/uL   Hemoglobin 14.3 11.1 - 15.9 g/dL   Hematocrit 43.4 34.0 - 46.6 %   MCV 91 79 - 97 fL   MCH 29.9 26.6 - 33.0 pg   MCHC 32.9 31.5 - 35.7 g/dL   RDW 13.4 11.7 - 15.4 %   Platelets 205 150 - 450 x10E3/uL   Neutrophils 52 Not Estab. %   Lymphs 36 Not Estab. %   Monocytes 9 Not Estab. %   Eos 2 Not Estab. %   Basos 1 Not Estab. %   Neutrophils Absolute 3.7  1.4 - 7.0 x10E3/uL   Lymphocytes Absolute 2.6 0.7 - 3.1 x10E3/uL   Monocytes Absolute 0.7 0.1 - 0.9 x10E3/uL   EOS (ABSOLUTE) 0.2 0.0 - 0.4 x10E3/uL   Basophils Absolute 0.0 0.0 - 0.2 x10E3/uL   Immature Granulocytes 0 Not Estab. %   Immature Grans (Abs) 0.0 0.0 - 0.1 x10E3/uL  Comprehensive metabolic panel  Result Value Ref Range   Glucose 86 65 - 99 mg/dL   BUN 15 6 - 24 mg/dL   Creatinine, Ser 0.70 0.57 - 1.00 mg/dL   eGFR 100 >59 mL/min/1.73   BUN/Creatinine Ratio 21 9 - 23   Sodium 139 134 - 144 mmol/L   Potassium 4.4 3.5 - 5.2 mmol/L   Chloride 101 96 - 106 mmol/L   CO2 23 20 - 29 mmol/L   Calcium 9.6 8.7 - 10.2 mg/dL   Total Protein 7.6 6.0 - 8.5 g/dL   Albumin 4.8 3.8 - 4.9 g/dL   Globulin, Total 2.8 1.5 - 4.5 g/dL   Albumin/Globulin Ratio 1.7 1.2 - 2.2   Bilirubin Total 0.4 0.0 - 1.2 mg/dL   Alkaline Phosphatase 88 44 - 121 IU/L   AST 18 0 - 40 IU/L   ALT 18 0 - 32 IU/L  Hemoglobin A1c  Result Value Ref Range   Hgb A1c MFr Bld 5.5 4.8 - 5.6 %   Est. average glucose Bld gHb Est-mCnc 111 mg/dL  Insulin, random  Result Value Ref Range   INSULIN 7.1 2.6 - 24.9 uIU/mL  Lipid Panel With LDL/HDL Ratio  Result Value Ref Range   Cholesterol, Total 216 (H) 100 - 199 mg/dL   Triglycerides 78 0 - 149 mg/dL   HDL 51 >39 mg/dL   VLDL Cholesterol Cal 14 5 - 40 mg/dL   LDL Chol Calc (NIH) 151 (H) 0 - 99 mg/dL   LDL/HDL Ratio 3.0 0.0 - 3.2 ratio  T3  Result Value Ref Range   T3, Total 123 71 - 180 ng/dL  T4  Result Value Ref Range   T4, Total 7.6 4.5 - 12.0 ug/dL  TSH  Result Value Ref Range   TSH 1.640 0.450 - 4.500 uIU/mL  VITAMIN D 25 Hydroxy (Vit-D Deficiency, Fractures)  Result Value Ref Range   Vit D, 25-Hydroxy 60.8 30.0 - 100.0 ng/mL   The 10-year ASCVD risk score Mikey Bussing DC Jr., et al., 2013) is: 5.8%   Values used to calculate the score:     Age: 53 years     Sex: Female     Is Non-Hispanic African American: Yes     Diabetic: No     Tobacco smoker: No      Systolic Blood Pressure: 664 mmHg  Is BP treated: Yes     HDL Cholesterol: 51 mg/dL     Total Cholesterol: 216 mg/dL   Assessment and Plan: 1. Essential hypertension At goal.  Continue amlodipine and low-salt diet.  2. Mixed hyperlipidemia Her 10-year ASCVD score is 5.8.  We will hold off on prescribing medication and recheck her in several months to see if LDL improves with continued weight loss.  3. Spondylosis of lumbar region without myelopathy or radiculopathy Patient on oxycodone through [facility] pain clinic.  Discussed with her the fact that oxycodone in combination with high-dose gabapentin can be lethal should incidental overdose occur.  I recommend that we cut back on the gabapentin.  Patient states she feels she is stable on the current dose which is a total of 3200 mg/day of gabapentin.  She also feels it helps with her nerves.  She states that she already self decreased by 400 mg a day.  In this case I recommend that she takes the gabapentin as 800 mg 4 times a day rather than 1600 mg twice a day.  We agreed to reassess on her next visit and at that time try to cut back on the gabapentin some more.  4. Obesity (BMI 30.0-34.9) Commended her on weight loss so far.  Plugged into medical weight management and is doing well   Follow Up Instructions: 4 mths   I discussed the assessment and treatment plan with the patient. The patient was provided an opportunity to ask questions and all were answered. The patient agreed with the plan and demonstrated an understanding of the instructions.   The patient was advised to call back or seek an in-person evaluation if the symptoms worsen or if the condition fails to improve as anticipated.  I  Spent 11 minutes on this telephone encounter  Karle Plumber, MD

## 2020-11-04 ENCOUNTER — Telehealth: Payer: Self-pay | Admitting: Internal Medicine

## 2020-11-04 NOTE — Telephone Encounter (Signed)
Called patient and no answer. A voicemail was left for patient to call [phone] to get scheduled for a 4 month f/u with Dr. Laural Benes. If Patient returns call please get her scheduled.

## 2020-11-06 ENCOUNTER — Ambulatory Visit: Payer: Medicaid Other | Admitting: Nurse Practitioner

## 2020-11-08 ENCOUNTER — Encounter: Payer: Self-pay | Admitting: Gastroenterology

## 2020-11-08 NOTE — Progress Notes (Deleted)
Referring Provider: Marcine Matar, MD Primary Care Physician:  Marcine Matar, MD Primary GI Physician: Dr. Bonnetta Barry chief complaint on file.   HPI:   Rachel Hudson is a 60 y.o. female presenting today for follow-up.  History of GERD, dysphagia, cirrhosis, hepatitis C s/p treatment with Harvoni and achieved SVR.  Colonoscopy up-to-date, due for repeat in 2027.  EGD June 2021 with normal esophagus s/p dilation, mild erosive reflux esophagitis, portal hypertensive gastropathy, due for repeat in 2023.  She is due for RUQ ultrasound in July and routine cirrhosis labs in September 2022.    Last seen in our office 05/08/2020.  No significant GI symptoms.  GERD doing well.  No symptoms of decompensated liver disease.  She did report drinking socially, 1 drink per 1 to 2 weeks.  Plan to update labs and ultrasound.  Follow-up in 6 months.  RUQ ultrasound with cirrhosis and hepatic cyst. Labs in March stable.  MELD 7.   Today:  GERD:  Cirrhosis:   Past Medical History:  Diagnosis Date   ADD (attention deficit disorder)    ADHD    Anxiety    Arthritis    Arthritis    Back pain    Bipolar 1 disorder (HCC)    Cirrhosis of liver (HCC) 01/2014   Stage 4   Foot pain, bilateral    GERD (gastroesophageal reflux disease)    Gout    Hand pain    Hep C w/o coma, chronic (HCC) As of 10/22/13   S/p treatment with Harvoni, achieved SVR   Hyperlipidemia    Hypertension    Joint pain    Liver problem    Osteoarthritis    Other fatigue    Shortness of breath    Shortness of breath on exertion    Vitamin D deficiency     Past Surgical History:  Procedure Laterality Date   BACK SURGERY     lumbar fusion   BIOPSY  09/12/2017   Procedure: BIOPSY;  Surgeon: Corbin Ade, MD;  [facility]: [facility];  Service: Endoscopy;;  gastric   CARPAL TUNNEL RELEASE Right 11/13/2015   Procedure: RIGHT CARPAL TUNNEL RELEASE;  Surgeon: Betha Loa, MD;  Location: [facility];  Service: Orthopedics;  Laterality: Right;   CARPAL TUNNEL RELEASE Left 01/29/2016   Procedure: left CARPAL TUNNEL RELEASE;  Surgeon: Betha Loa, MD;  Location: [facility];  Service: Orthopedics;  Laterality: Left;   COLONOSCOPY N/A 07/08/2015   Surgeon: Corbin Ade, MD; internal hemorrhoids, colonic diverticulosis.  Repeat in 10 years.   ESOPHAGOGASTRODUODENOSCOPY N/A 07/08/2015   Surgeon: Corbin Ade, MD; erosive reflux esophagitis, hiatal hernia, gastric erosion and ulcer s/p biopsy (reactive gastropathy, negative H. pylori), no esophageal varices or portal hypertension.   ESOPHAGOGASTRODUODENOSCOPY (EGD) WITH PROPOFOL N/A 09/12/2017   Surgeon: Corbin Ade, MD; normal esophagus s/p dilation, portal hypertensive gastropathy, erosive gastropathy biopsied (negative for H. pylori), normal examined duodenum.   ESOPHAGOGASTRODUODENOSCOPY (EGD) WITH PROPOFOL N/A 10/25/2019   Surgeon: Corbin Ade, MD;  normal esophagus s/p dilation, mild erosive reflux esophagitis, portal hypertensive gastropathy, due for repeat in 2023.   FINGER ARTHROSCOPY WITH CARPOMETACARPEL [facility]) ARTHROPLASTY Right 04/24/2020   Procedure: RIGHT INDEX FINGER METACARPAL PHALANGEAL ARTHROPLASTY;  Surgeon: Betha Loa, MD;  Location: [facility];  Service: Orthopedics;  Laterality: Right;   LIGAMENT REPAIR Right 1980   Knee   MALONEY DILATION N/A 09/12/2017   Procedure: Elease Hashimoto  DILATION;  Surgeon: Corbin Ade, MD;  Location: [facility];  Service: Endoscopy;  Laterality: N/A;   MALONEY DILATION N/A 10/25/2019   Procedure: Elease Hashimoto DILATION;  Surgeon: Corbin Ade, MD;  Location: [facility];  Service: Endoscopy;  Laterality: N/A;   TRIGGER FINGER RELEASE Right 04/24/2020   Procedure: RIGHT LONG TRIGGER FINGER RELEASE TRIGGER FINGER/A-1 PULLEY;  Surgeon: Betha Loa, MD;  Location: [facility];  Service: Orthopedics;  Laterality: Right;    Current  Outpatient Medications  Medication Sig Dispense Refill   amLODipine (NORVASC) 10 MG tablet Take 1 tablet by mouth once daily 30 tablet 0   celecoxib (CELEBREX) 200 MG capsule Take 1 capsule by mouth once daily 30 capsule 0   COLLAGEN PO Take 3 g by mouth daily.     dexlansoprazole (DEXILANT) 60 MG capsule Take 1 capsule (60 mg total) by mouth daily. 30 capsule 11   ergocalciferol (VITAMIN D2) 1.25 MG (50000 UT) capsule Take 1 capsule by mouth once a week.     Flax Oil-Fish Oil-Borage Oil (FISH-FLAX-BORAGE PO) Take 1 capsule by mouth daily.     gabapentin (NEURONTIN) 800 MG tablet Take 1 tablet (800 mg total) by mouth every 6 (six) hours. 120 tablet 3   Lecith-Inosi-Chol-B12-Liver (LIVERITE) 1.125 MCG TABS Take 1 tablet by mouth 2 (two) times daily. LIVER AID     milk thistle 175 MG tablet Take 175 mg by mouth daily.     MITIGARE 0.6 MG CAPS Take 1 capsule by mouth twice daily 60 capsule 0   Multiple Vitamin (MULTIVITAMIN WITH MINERALS) TABS tablet Take 1 tablet by mouth daily.     OVER THE COUNTER MEDICATION salompad     Oxycodone HCl 10 MG TABS Take 10 mg by mouth 5 (five) times daily.     Tetrahydrozoline HCl (VISINE OP) Place 1 drop into both eyes daily as needed (allergies).     No current facility-administered medications for this visit.    Allergies as of 11/10/2020 - Review Complete 11/03/2020  Allergen Reaction Noted   Penicillins  03/28/2011   Tylenol [acetaminophen] Other (See Comments) 07/14/2014    Family History  Adopted: Yes  Problem Relation Age of Onset   Colon cancer Neg Hx     Social History   Socioeconomic History   Marital status: Single    Spouse name: Not on file   Number of children: Not on file   Years of education: Not on file   Highest education level: Not on file  Occupational History   Occupation: disabled  Tobacco Use   Smoking status: Never   Smokeless tobacco: Never  Vaping Use   Vaping Use: Never used  Substance and Sexual Activity    Alcohol use: Yes    Alcohol/week: 0.0 standard drinks    Comment: has been drinking once/week since October   Drug use: Yes    Frequency: 7.0 times per week    Types: Marijuana    Comment: daily for arthritis   Sexual activity: Not Currently    Partners: Male    Birth control/protection: None, Condom  Other Topics Concern   Not on file  Social History Narrative   Not on file   Social Determinants of Health   Financial Resource Strain: Not on file  Food Insecurity: Not on file  Transportation Needs: Not on file  Physical Activity: Not on file  Stress: Not on file  Social Connections: Not on file    Review of Systems:  Gen: Denies fever, chills, anorexia. Denies fatigue, weakness, weight loss.  CV: Denies chest pain, palpitations, syncope, peripheral edema, and claudication. Resp: Denies dyspnea at rest, cough, wheezing, coughing up blood, and pleurisy. GI: Denies vomiting blood, jaundice, and fecal incontinence.   Denies dysphagia or odynophagia. Derm: Denies rash, itching, dry skin Psych: Denies depression, anxiety, memory loss, confusion. No homicidal or suicidal ideation.  Heme: Denies bruising, bleeding, and enlarged lymph nodes.  Physical Exam: LMP 09/29/2016 (Approximate)  General:   Alert and oriented. No distress noted. Pleasant and cooperative.  Head:  Normocephalic and atraumatic. Eyes:  Conjuctiva clear without scleral icterus. Mouth:  Oral mucosa pink and moist. Good dentition. No lesions. Heart:  S1, S2 present without murmurs appreciated. Lungs:  Clear to auscultation bilaterally. No wheezes, rales, or rhonchi. No distress.  Abdomen:  +BS, soft, non-tender and non-distended. No rebound or guarding. No HSM or masses noted. Msk:  Symmetrical without gross deformities. Normal posture. Extremities:  Without edema. Neurologic:  Alert and  oriented x4 Psych:  Alert and cooperative. Normal mood and affect.

## 2020-11-10 ENCOUNTER — Encounter: Payer: Self-pay | Admitting: Internal Medicine

## 2020-11-10 ENCOUNTER — Ambulatory Visit: Payer: Medicaid Other | Admitting: Gastroenterology

## 2020-11-11 DIAGNOSIS — Z79899 Other long term (current) drug therapy: Secondary | ICD-10-CM | POA: Diagnosis not present

## 2020-11-12 ENCOUNTER — Other Ambulatory Visit: Payer: Self-pay | Admitting: Internal Medicine

## 2020-11-13 DIAGNOSIS — Z79899 Other long term (current) drug therapy: Secondary | ICD-10-CM | POA: Diagnosis not present

## 2020-11-17 ENCOUNTER — Other Ambulatory Visit: Payer: Self-pay | Admitting: Internal Medicine

## 2020-11-17 DIAGNOSIS — Z1231 Encounter for screening mammogram for malignant neoplasm of breast: Secondary | ICD-10-CM

## 2020-11-19 ENCOUNTER — Encounter (INDEPENDENT_AMBULATORY_CARE_PROVIDER_SITE_OTHER): Payer: Self-pay | Admitting: Family Medicine

## 2020-11-19 ENCOUNTER — Other Ambulatory Visit: Payer: Self-pay

## 2020-11-19 ENCOUNTER — Ambulatory Visit (INDEPENDENT_AMBULATORY_CARE_PROVIDER_SITE_OTHER): Payer: Medicaid Other | Admitting: Family Medicine

## 2020-11-19 VITALS — BP 128/73 | HR 62 | Temp 98.0°F | Ht 60.0 in | Wt 163.0 lb

## 2020-11-19 DIAGNOSIS — E559 Vitamin D deficiency, unspecified: Secondary | ICD-10-CM

## 2020-11-19 DIAGNOSIS — E7849 Other hyperlipidemia: Secondary | ICD-10-CM | POA: Diagnosis not present

## 2020-11-19 DIAGNOSIS — E669 Obesity, unspecified: Secondary | ICD-10-CM

## 2020-11-19 DIAGNOSIS — Z6832 Body mass index (BMI) 32.0-32.9, adult: Secondary | ICD-10-CM | POA: Diagnosis not present

## 2020-11-19 DIAGNOSIS — I10 Essential (primary) hypertension: Secondary | ICD-10-CM | POA: Diagnosis not present

## 2020-11-19 DIAGNOSIS — E66811 Obesity, class 1: Secondary | ICD-10-CM

## 2020-11-24 ENCOUNTER — Other Ambulatory Visit: Payer: Self-pay | Admitting: Internal Medicine

## 2020-11-24 DIAGNOSIS — I10 Essential (primary) hypertension: Secondary | ICD-10-CM

## 2020-11-26 NOTE — Progress Notes (Signed)
Chief Complaint:   OBESITY Rachel Hudson is here to discuss her progress with her obesity treatment plan along with follow-up of her obesity related diagnoses. Rachel Hudson is on the Category 2 Plan + 100 calories and states she is following her eating plan approximately 50% of the time. Rachel Hudson states she is swimming, mowing, and cycling 20 minutes 1 times per week.  Today's visit was #: 2 Starting weight: 166 lbs Starting date: 10/15/2020 Today's weight: 163 lbs Today's date: 11/19/2020 Total lbs lost to date: 3 Total lbs lost since last in-office visit: 3  Interim History: Rachel Hudson feels breakfast and lunch to be easier to adhere to than dinner as she is often going out. She may skip meals as well. Food quantity and options are enough. For breakfast, she is doing 2 eggs and a piece of toast. She is eating a good  amount of fruit. Lunch is often lunch and dinner. She is doing protein shakes.   Subjective:   1. Other hyperlipidemia Rachel Hudson has a total cholesterol of 216, LDL of 151, HDL 51, and triglycerides 78. She is not on statin therapy. 10 year ASCVD risk score 7.2% (moderate int. statin recommended)  2. Vitamin D deficiency Rachel Hudson denies nausea, vomiting, and muscle weakness but notes fatigue. Pt is on prescription Vit D. Her Vit D level is 60.8.  3. Primary hypertension BP is controlled today. Pt denies chest pain/chest pressure/headache.  Assessment/Plan:   1. Other hyperlipidemia Cardiovascular risk and specific lipid/LDL goals reviewed.  We discussed several lifestyle modifications today and Rachel Hudson will continue to work on diet, exercise and weight loss efforts. Orders and follow up as documented in patient record. Repeat labs in 3 months, if cholesterol is still elevated, we will discuss meds.  Counseling Intensive lifestyle modifications are the first line treatment for this issue. Dietary changes: Increase soluble fiber. Decrease simple carbohydrates. Exercise changes:  Moderate to vigorous-intensity aerobic activity 150 minutes per week if tolerated. Lipid-lowering medications: see documented in medical record.  2. Vitamin D deficiency Low Vitamin D level contributes to fatigue and are associated with obesity, breast, and colon cancer. She agrees to change to OTC Vitamin D3 daily and will follow-up for routine testing of Vitamin D, at least 2-3 times per year to avoid over-replacement.  3. Primary hypertension Rachel Hudson is working on healthy weight loss and exercise to improve blood pressure control. We will watch for signs of hypotension as she continues her lifestyle modifications. Continue current treatment plan.  4. Class 1 obesity with serious comorbidity and body mass index (BMI) of 32.0 to 32.9 in adult, unspecified obesity type  Rachel Hudson is currently in the action stage of change. As such, her goal is to continue with weight loss efforts. She has agreed to the Category 2 Plan.   Exercise goals:  As is  Behavioral modification strategies: increasing lean protein intake, decreasing eating out, no skipping meals, meal planning and cooking strategies, and keeping healthy foods in the home.  Rachel Hudson has agreed to follow-up with our clinic in 2 weeks. She was informed of the importance of frequent follow-up visits to maximize her success with intensive lifestyle modifications for her multiple health conditions.   Objective:   Blood pressure 128/73, pulse 62, temperature 98 F (36.7 C), height 5' (1.524 m), weight 163 lb (73.9 kg), last menstrual period 09/29/2016, SpO2 96 %. Body mass index is 31.83 kg/m.  General: Cooperative, alert, well developed, in no acute distress. HEENT: Conjunctivae and lids unremarkable. Cardiovascular: Regular rhythm.  Lungs: Normal work of breathing. Neurologic: No focal deficits.   Lab Results  Component Value Date   CREATININE 0.70 10/15/2020   BUN 15 10/15/2020   NA 139 10/15/2020   K 4.4 10/15/2020   CL 101  10/15/2020   CO2 23 10/15/2020   Lab Results  Component Value Date   ALT 18 10/15/2020   AST 18 10/15/2020   ALKPHOS 88 10/15/2020   BILITOT 0.4 10/15/2020   Lab Results  Component Value Date   HGBA1C 5.5 10/15/2020   HGBA1C 5.4 04/06/2017   HGBA1C 5.7 (H) 09/28/2013   Lab Results  Component Value Date   INSULIN 7.1 10/15/2020   Lab Results  Component Value Date   TSH 1.640 10/15/2020   Lab Results  Component Value Date   CHOL 216 (H) 10/15/2020   HDL 51 10/15/2020   LDLCALC 151 (H) 10/15/2020   TRIG 78 10/15/2020   CHOLHDL 5.1 (H) 10/26/2019   Lab Results  Component Value Date   VD25OH 60.8 10/15/2020   Lab Results  Component Value Date   WBC 7.2 10/15/2020   HGB 14.3 10/15/2020   HCT 43.4 10/15/2020   MCV 91 10/15/2020   PLT 205 10/15/2020   Lab Results  Component Value Date   IRON 356 (H) 12/31/2013   Attestation Statements:   Reviewed by clinician on day of visit: allergies, medications, problem list, medical history, surgical history, family history, social history, and previous encounter notes.  Time spent on visit including pre-visit chart review and post-visit care and charting was 25 minutes.   Edmund Hilda, CMA, am acting as transcriptionist for Reuben Likes, MD.  I have reviewed the above documentation for accuracy and completeness, and I agree with the above. - Katherina Mires, MD

## 2020-12-03 ENCOUNTER — Other Ambulatory Visit: Payer: Self-pay

## 2020-12-03 ENCOUNTER — Ambulatory Visit (INDEPENDENT_AMBULATORY_CARE_PROVIDER_SITE_OTHER): Payer: Medicaid Other | Admitting: Family Medicine

## 2020-12-03 ENCOUNTER — Encounter (INDEPENDENT_AMBULATORY_CARE_PROVIDER_SITE_OTHER): Payer: Self-pay | Admitting: Family Medicine

## 2020-12-03 VITALS — BP 111/56 | HR 62 | Temp 98.6°F | Ht 60.0 in | Wt 165.0 lb

## 2020-12-03 DIAGNOSIS — E66811 Obesity, class 1: Secondary | ICD-10-CM | POA: Insufficient documentation

## 2020-12-03 DIAGNOSIS — E7849 Other hyperlipidemia: Secondary | ICD-10-CM

## 2020-12-03 DIAGNOSIS — Z6832 Body mass index (BMI) 32.0-32.9, adult: Secondary | ICD-10-CM

## 2020-12-03 DIAGNOSIS — E669 Obesity, unspecified: Secondary | ICD-10-CM | POA: Insufficient documentation

## 2020-12-03 NOTE — Progress Notes (Signed)
The 10-year ASCVD risk score Denman George DC Montez Hageman., et al., 2013) is: 4.7%   Values used to calculate the score:     Age: 60 years     Sex: Female     Is Non-Hispanic African American: Yes     Diabetic: No     Tobacco smoker: No     Systolic Blood Pressure: 111 mmHg     Is BP treated: Yes     HDL Cholesterol: 51 mg/dL     Total Cholesterol: 216 mg/dL

## 2020-12-09 NOTE — Progress Notes (Signed)
Chief Complaint:   OBESITY Rachel Hudson is here to discuss her progress with her obesity treatment plan along with follow-up of her obesity related diagnoses. [name] is on the Category 2 Plan and states she is following her eating plan approximately 10% of the time. [name] states she is  swimming and walking for 60 minutes 6-7 times per week.  Today's visit was #: 3 Starting weight: 166 lbs Starting date: 10/15/2020 Today's weight: 165 lbs Today's date:12/03/2020 Total lbs lost to date: 1 lb Total lbs lost since last in-office visit: 0  Interim History: Rachel Hudson has been off plan since the 4th of July. She likes the plan food and denies excessive hunger. She says she is unable to get time off work and wants to wait a while before she comes back. She plans to adhere to the eating plan however.  Subjective:   1. Other hyperlipidemia Rachel Hudson's last LDL was elevated at 151. Her Triglycerides and HDL was within normal limits. She is not on statin. Her ASCVD risk score was 4.7%. Patient denies myalgias.   Lab Results  Component Value Date   CHOL 216 (H) 10/15/2020   HDL 51 10/15/2020   LDLCALC 151 (H) 10/15/2020   TRIG 78 10/15/2020   CHOLHDL 5.1 (H) 10/26/2019   Lab Results  Component Value Date   ALT 18 10/15/2020   AST 18 10/15/2020   ALKPHOS 88 10/15/2020   BILITOT 0.4 10/15/2020   The 10-year ASCVD risk score Denman George DC Jr., et al., 2013) is: 4.7%   Values used to calculate the score:     Age: 68 years     Sex: Female     Is Non-Hispanic African American: Yes     Diabetic: No     Tobacco smoker: No     Systolic Blood Pressure: 111 mmHg     Is BP treated: Yes     HDL Cholesterol: 51 mg/dL     Total Cholesterol: 216 mg/dL   Assessment/Plan:   1. Other hyperlipidemia  No statin indicated.  2. Obesity: Current BMI 32.22 Rachel Hudson is currently in the action stage of change. As such, her goal is to continue with weight loss efforts. She has agreed to the Category 2 Plan.    Exercise goals:  As is.  Behavioral modification strategies: increasing lean protein intake and decreasing simple carbohydrates.  Romaine has agreed to follow-up with our clinic in 8 weeks (per patient request).  Objective:   Blood pressure (!) 111/56, pulse 62, temperature 98.6 F (37 C), height 5' (1.524 m), weight 165 lb (74.8 kg), last menstrual period 09/29/2016, SpO2 97 %. Body mass index is 32.22 kg/m.  General: Cooperative, alert, well developed, in no acute distress. HEENT: Conjunctivae and lids unremarkable. Cardiovascular: Regular rhythm.  Lungs: Normal work of breathing. Neurologic: No focal deficits.   Lab Results  Component Value Date   CREATININE 0.70 10/15/2020   BUN 15 10/15/2020   NA 139 10/15/2020   K 4.4 10/15/2020   CL 101 10/15/2020   CO2 23 10/15/2020   Lab Results  Component Value Date   ALT 18 10/15/2020   AST 18 10/15/2020   ALKPHOS 88 10/15/2020   BILITOT 0.4 10/15/2020   Lab Results  Component Value Date   HGBA1C 5.5 10/15/2020   HGBA1C 5.4 04/06/2017   HGBA1C 5.7 (H) 09/28/2013   Lab Results  Component Value Date   INSULIN 7.1 10/15/2020   Lab Results  Component Value Date   TSH 1.640  10/15/2020   Lab Results  Component Value Date   CHOL 216 (H) 10/15/2020   HDL 51 10/15/2020   LDLCALC 151 (H) 10/15/2020   TRIG 78 10/15/2020   CHOLHDL 5.1 (H) 10/26/2019   Lab Results  Component Value Date   VD25OH 60.8 10/15/2020   Lab Results  Component Value Date   WBC 7.2 10/15/2020   HGB 14.3 10/15/2020   HCT 43.4 10/15/2020   MCV 91 10/15/2020   PLT 205 10/15/2020   Lab Results  Component Value Date   IRON 356 (H) 12/31/2013   Attestation Statements:   Reviewed by clinician on day of visit: allergies, medications, problem list, medical history, surgical history, family history, social history, and previous encounter notes.  I, Jackson Latino, RMA, am acting as Energy manager for [place], FNP.  I have reviewed the  above documentation for accuracy and completeness, and I agree with the above. -  Jesse Sans, FNP

## 2020-12-11 ENCOUNTER — Encounter (INDEPENDENT_AMBULATORY_CARE_PROVIDER_SITE_OTHER): Payer: Self-pay | Admitting: Family Medicine

## 2020-12-11 DIAGNOSIS — E7849 Other hyperlipidemia: Secondary | ICD-10-CM | POA: Insufficient documentation

## 2020-12-11 DIAGNOSIS — Z79899 Other long term (current) drug therapy: Secondary | ICD-10-CM | POA: Diagnosis not present

## 2020-12-12 ENCOUNTER — Other Ambulatory Visit: Payer: Self-pay | Admitting: Internal Medicine

## 2020-12-12 DIAGNOSIS — M19041 Primary osteoarthritis, right hand: Secondary | ICD-10-CM

## 2020-12-12 DIAGNOSIS — M19042 Primary osteoarthritis, left hand: Secondary | ICD-10-CM

## 2020-12-15 DIAGNOSIS — Z79899 Other long term (current) drug therapy: Secondary | ICD-10-CM | POA: Diagnosis not present

## 2020-12-17 ENCOUNTER — Ambulatory Visit (INDEPENDENT_AMBULATORY_CARE_PROVIDER_SITE_OTHER): Payer: Medicaid Other | Admitting: Family Medicine

## 2020-12-25 ENCOUNTER — Other Ambulatory Visit: Payer: Self-pay | Admitting: Internal Medicine

## 2020-12-25 DIAGNOSIS — I10 Essential (primary) hypertension: Secondary | ICD-10-CM

## 2020-12-25 NOTE — Telephone Encounter (Signed)
Requested medication (s) are due for refill today:   Yes  Requested medication (s) are on the active medication list:   Yes  Future visit scheduled:   No  Had appt with Dr. Laural Benes but left before being seen per the chart on 10/30/2020   Last ordered: 11/25/2020 #30, 0 refills  Returned for provider to review for further refills since she left prior to being seen and 30 day courtesy refill given in July.  Requested Prescriptions  Pending Prescriptions Disp Refills   amLODipine (NORVASC) 10 MG tablet [Pharmacy Med Name: amLODIPine Besylate 10 MG Oral Tablet] 30 tablet 0    Sig: Take 1 tablet by mouth once daily      Cardiovascular:  Calcium Channel Blockers Passed - 12/25/2020  9:00 AM      Passed - Last BP in normal range    BP Readings from Last 1 Encounters:  12/03/20 (!) 111/56          Passed - Valid encounter within last 6 months    Recent Outpatient Visits           1 month ago Essential hypertension   [facility] Marcine Matar, MD   1 month ago Patient left before evaluation by physician   [facility] Marcine Matar, MD   10 months ago Need for influenza vaccination   [facility] Lois Huxley, Cornelius Moras, RPH-CPP   10 months ago Essential hypertension   [facility] Marcine Matar, MD   1 year ago Annual physical exam   [facility] Marcine Matar, MD

## 2020-12-29 ENCOUNTER — Other Ambulatory Visit: Payer: Self-pay | Admitting: Internal Medicine

## 2021-01-02 ENCOUNTER — Other Ambulatory Visit: Payer: Self-pay

## 2021-01-02 ENCOUNTER — Ambulatory Visit
Admission: RE | Admit: 2021-01-02 | Discharge: 2021-01-02 | Disposition: A | Discharge status: Home / Self Care | Payer: Medicaid Other | Source: Ambulatory Visit | Attending: Internal Medicine | Admitting: Internal Medicine

## 2021-01-02 DIAGNOSIS — Z1231 Encounter for screening mammogram for malignant neoplasm of breast: Secondary | ICD-10-CM

## 2021-01-06 ENCOUNTER — Ambulatory Visit: Payer: Medicaid Other | Admitting: Internal Medicine

## 2021-01-12 ENCOUNTER — Telehealth: Payer: Self-pay | Admitting: Internal Medicine

## 2021-01-12 DIAGNOSIS — Z79899 Other long term (current) drug therapy: Secondary | ICD-10-CM | POA: Diagnosis not present

## 2021-01-12 NOTE — Telephone Encounter (Signed)
Received a PA on pt's dexilant 60 mg. PA was denied even though pt has tried and failed two other formulary medications. [employer] and they stated that the only other option was for the provider to do a peer to peer review. A peer to peer review has to be scheduled at [phone] within 7 days from today. Ref # [id number]. Please advise.

## 2021-01-14 ENCOUNTER — Other Ambulatory Visit: Payer: Self-pay | Admitting: Internal Medicine

## 2021-01-14 DIAGNOSIS — Z79899 Other long term (current) drug therapy: Secondary | ICD-10-CM | POA: Diagnosis not present

## 2021-01-14 DIAGNOSIS — M19042 Primary osteoarthritis, left hand: Secondary | ICD-10-CM

## 2021-01-15 ENCOUNTER — Encounter: Payer: Self-pay | Admitting: *Deleted

## 2021-01-16 ENCOUNTER — Other Ambulatory Visit: Payer: Self-pay | Admitting: Internal Medicine

## 2021-01-16 DIAGNOSIS — I10 Essential (primary) hypertension: Secondary | ICD-10-CM

## 2021-01-19 MED ORDER — PANTOPRAZOLE SODIUM 40 MG PO TBEC: 40.0000 mg | DELAYED_RELEASE_TABLET | Freq: Two times a day (BID) | ORAL | 5 refills | Status: DC

## 2021-01-19 NOTE — Telephone Encounter (Signed)
Received a request for an alternate medication to be sent in due to Dexilant 60 mg cap  PA being denied.

## 2021-01-19 NOTE — Telephone Encounter (Signed)
I have sent in Protonix 40 mg po BID before meals. From Eric's last note, she was doing well on this in Dec 2021.

## 2021-01-19 NOTE — Telephone Encounter (Signed)
Lmom for pt to return my call.  

## 2021-01-19 NOTE — Addendum Note (Signed)
Addended by: Gelene Mink on: 01/19/2021 09:39 AM   Modules accepted: Orders

## 2021-01-21 ENCOUNTER — Telehealth: Payer: Self-pay | Admitting: Internal Medicine

## 2021-01-21 NOTE — Telephone Encounter (Signed)
..  Patient declines further follow up and engagement by the Managed Medicaid Team. Appropriate care team members and provider have been notified via electronic communication. The Managed Medicaid Team is available to follow up with the patient after provider conversation with the patient regarding recommendation for engagement and subsequent re-referral to the Managed Medicaid Team.    Jennifer Alley Care Guide, High Risk Medicaid Managed Care Embedded Care Coordination [place]  [facility]   

## 2021-01-22 ENCOUNTER — Other Ambulatory Visit: Payer: Self-pay

## 2021-01-22 ENCOUNTER — Other Ambulatory Visit: Payer: Self-pay | Admitting: *Deleted

## 2021-01-22 NOTE — Patient Instructions (Signed)
Thank you for taking time to speak with me today about care coordination and care management services available to you at no cost as part of your Medicaid benefit. These services are voluntary. Our team is available to provide assistance regarding your health care needs at any time. Please do not hesitate to reach out to me if we can be of service to you at any time in the future.   Shuntia Exton RN, BSN [place]  [facility] RN Care Coordinator  

## 2021-01-22 NOTE — Patient Outreach (Signed)
Care Coordination  01/22/2021  LAETITIA SCHNEPF [id number] [phone]  RNCM will complete care plans and close this case. RNCM has been informed that this Ms. Vandezande no longer wishes to have CM services provided by HR Managed Medicaid Care Team. This team is available at any time should Ms. Mallari change her mind.  Estanislado Emms RN, BSN [place]  [facility]

## 2021-01-28 ENCOUNTER — Ambulatory Visit (INDEPENDENT_AMBULATORY_CARE_PROVIDER_SITE_OTHER): Payer: Medicaid Other | Admitting: Family Medicine

## 2021-01-28 ENCOUNTER — Telehealth (INDEPENDENT_AMBULATORY_CARE_PROVIDER_SITE_OTHER): Payer: Self-pay

## 2021-01-28 ENCOUNTER — Encounter (INDEPENDENT_AMBULATORY_CARE_PROVIDER_SITE_OTHER): Payer: Self-pay | Admitting: Family Medicine

## 2021-01-28 ENCOUNTER — Other Ambulatory Visit: Payer: Self-pay

## 2021-01-28 VITALS — BP 116/64 | HR 56 | Temp 97.9°F | Ht 60.0 in | Wt 160.0 lb

## 2021-01-28 DIAGNOSIS — F319 Bipolar disorder, unspecified: Secondary | ICD-10-CM | POA: Insufficient documentation

## 2021-01-28 DIAGNOSIS — E88819 Insulin resistance, unspecified: Secondary | ICD-10-CM | POA: Insufficient documentation

## 2021-01-28 DIAGNOSIS — E8881 Metabolic syndrome: Secondary | ICD-10-CM | POA: Diagnosis not present

## 2021-01-28 DIAGNOSIS — E669 Obesity, unspecified: Secondary | ICD-10-CM

## 2021-01-28 DIAGNOSIS — Z6832 Body mass index (BMI) 32.0-32.9, adult: Secondary | ICD-10-CM

## 2021-01-28 MED ORDER — TRULICITY 0.75 MG/0.5ML ~~LOC~~ SOAJ: 0.7500 mg | SUBCUTANEOUS | 0 refills | Status: DC

## 2021-01-28 NOTE — Telephone Encounter (Signed)
Prior authorization has been started for Trulicity. Will notify patient and provider once response is received.  

## 2021-01-28 NOTE — Telephone Encounter (Signed)
Pt need a PA for Trulicity.  Please advise.

## 2021-01-28 NOTE — Progress Notes (Signed)
Chief Complaint:   OBESITY Rachel Hudson is here to discuss her progress with her obesity treatment plan along with follow-up of her obesity related diagnoses. [name] is on the Category 2 Plan and states she is following her eating plan approximately 10% of the time. [name] states she is mowing lawns and walking for 60 minutes 7 times per week.  Today's visit was #: 4 Starting weight: 166 lbs Starting date: 10/15/2020 Today's weight: 160 lbs Today's date: 01/28/2021 Total lbs lost to date: 6 lbs Total lbs lost since last in-office visit: 5 lbs  Interim History: [name] has a hard time eating all of the food. She is on plan at breakfast. She has been eating out at lunch and having half of the serving at lunch and half at dinner. She is deviating from plan quite a bit.  Subjective:   1. Insulin resistance [name] notes some polyphagia. She would like to start a medication for appetite.  Lab Results  Component Value Date   INSULIN 7.1 10/15/2020   Lab Results  Component Value Date   HGBA1C 5.5 10/15/2020    2. Bipolar 1 disorder (HCC) [name] says she no longer see psychiatry or takes medication. She self medicates with marijuana and says this is all she needs.   Assessment/Plan:   1. Insulin resistance [name] agrees to start Trulicity 0.75 weekly. She has no history cholelithiasis, pancreatitis or thyroid cancer.  [name] agreed to follow-up with [name] as directed to closely monitor her progress.  - Dulaglutide (TRULICITY) 0.75 MG/0.5ML SOPN; Inject 0.75 mg into the skin once a week.  Dispense: 2 mL; Refill: 0  2. Bipolar 1 disorder (HCC) We will continue to monitor [name].  3. Obesity: Current BMI 31.25 [name] is currently in the action stage of change. As such, her goal is to continue with weight loss efforts. She has agreed to practicing portion control and making smarter food choices, such as increasing vegetables and decreasing simple carbohydrates 80 grams of protein  per day.  Handouts: Protein Content of Food.  Exercise goals:  As is.  Behavioral modification strategies: increasing lean protein intake and decreasing simple carbohydrates.  [name] has agreed to follow-up with our clinic in 3 weeks(fasting). ensive lifestyle modifications for her multiple health conditions.   Objective:   Blood pressure 116/64, pulse (!) 56, temperature 97.9 F (36.6 C), height 5' (1.524 m), weight 160 lb (72.6 kg), last menstrual period 09/29/2016, SpO2 98 %. Body mass index is 31.25 kg/m.  General: Cooperative, alert, well developed, in no acute distress. HEENT: Conjunctivae and lids unremarkable. Cardiovascular: Regular rhythm.  Lungs: Normal work of breathing. Neurologic: No focal deficits.   Lab Results  Component Value Date   CREATININE 0.70 10/15/2020   BUN 15 10/15/2020   NA 139 10/15/2020   K 4.4 10/15/2020   CL 101 10/15/2020   CO2 23 10/15/2020   Lab Results  Component Value Date   ALT 18 10/15/2020   AST 18 10/15/2020   ALKPHOS 88 10/15/2020   BILITOT 0.4 10/15/2020   Lab Results  Component Value Date   HGBA1C 5.5 10/15/2020   HGBA1C 5.4 04/06/2017   HGBA1C 5.7 (H) 09/28/2013   Lab Results  Component Value Date   INSULIN 7.1 10/15/2020   Lab Results  Component Value Date   TSH 1.640 10/15/2020   Lab Results  Component Value Date   CHOL 216 (H) 10/15/2020   HDL 51 10/15/2020   LDLCALC 151 (H) 10/15/2020   TRIG 78  10/15/2020   CHOLHDL 5.1 (H) 10/26/2019   Lab Results  Component Value Date   VD25OH 60.8 10/15/2020   Lab Results  Component Value Date   WBC 7.2 10/15/2020   HGB 14.3 10/15/2020   HCT 43.4 10/15/2020   MCV 91 10/15/2020   PLT 205 10/15/2020   Lab Results  Component Value Date   IRON 356 (H) 12/31/2013   Attestation Statements:   Reviewed by clinician on day of visit: allergies, medications, problem list, medical history, surgical history, family history, social history, and previous encounter  notes.  I, Jackson Latino, RMA, am acting as Energy manager for [place], FNP.   I have reviewed the above documentation for accuracy and completeness, and I agree with the above. -  Jesse Sans, FNP

## 2021-01-28 NOTE — Telephone Encounter (Signed)
Prior authorization denied for Trulicity by [employer], listed below.   (1) You have a disease with high blood sugars (diabetes) and one of the following: (A) A type of heart disease (atherosclerotic cardiovascular disease or heart failure). (B) A type of kidney disease (chronic kidney disease). (2) You have tried or cannot use one metformin containing drug If you are going to change the patient medication to another, please notify patient.

## 2021-01-29 ENCOUNTER — Encounter (INDEPENDENT_AMBULATORY_CARE_PROVIDER_SITE_OTHER): Payer: Self-pay | Admitting: Family Medicine

## 2021-02-03 ENCOUNTER — Other Ambulatory Visit: Payer: Self-pay | Admitting: Internal Medicine

## 2021-02-03 NOTE — Telephone Encounter (Signed)
Requested medication (s) are due for refill today - yes  Requested medication (s) are on the active medication list -yes  Future visit scheduled -yes  Last refill: 12/29/20  Notes to clinic: Request RF: fails lab protocol- uric acid -last check in chart-2017  Requested Prescriptions  Pending Prescriptions Disp Refills   MITIGARE 0.6 MG CAPS [Pharmacy Med Name: Mitigare 0.6 MG Oral Capsule] 60 capsule 0    Sig: Take 1 capsule by mouth twice daily     Endocrinology:  Gout Agents Failed - 02/03/2021 11:17 AM      Failed - Uric Acid in normal range and within 360 days    Uric Acid, Serum  Date Value Ref Range Status  04/13/2016 4.1 2.5 - 7.0 mg/dL Final          Passed - Cr in normal range and within 360 days    Creat  Date Value Ref Range Status  06/15/2016 0.76 0.50 - 1.05 mg/dL Final    Comment:      For patients > or = 59 years of age: The upper reference limit for Creatinine is approximately 13% higher for people identified as African-American.      Creatinine, Ser  Date Value Ref Range Status  10/15/2020 0.70 0.57 - 1.00 mg/dL Final          Passed - Valid encounter within last 12 months    Recent Outpatient Visits           3 months ago Essential hypertension   [facility] Marcine Matar, MD   3 months ago Patient left before evaluation by physician   [facility] Marcine Matar, MD   11 months ago Need for influenza vaccination   [facility] Lois Huxley, Cornelius Moras, RPH-CPP   11 months ago Essential hypertension   [facility] Marcine Matar, MD   1 year ago Annual physical exam   [facility] Marcine Matar, MD                 Requested Prescriptions  Pending Prescriptions Disp Refills   MITIGARE 0.6 MG CAPS [Pharmacy Med Name: Mitigare 0.6 MG Oral Capsule] 60 capsule 0    Sig: Take 1  capsule by mouth twice daily     Endocrinology:  Gout Agents Failed - 02/03/2021 11:17 AM      Failed - Uric Acid in normal range and within 360 days    Uric Acid, Serum  Date Value Ref Range Status  04/13/2016 4.1 2.5 - 7.0 mg/dL Final          Passed - Cr in normal range and within 360 days    Creat  Date Value Ref Range Status  06/15/2016 0.76 0.50 - 1.05 mg/dL Final    Comment:      For patients > or = 59 years of age: The upper reference limit for Creatinine is approximately 13% higher for people identified as African-American.      Creatinine, Ser  Date Value Ref Range Status  10/15/2020 0.70 0.57 - 1.00 mg/dL Final          Passed - Valid encounter within last 12 months    Recent Outpatient Visits           3 months ago Essential hypertension   [facility] Marcine Matar, MD   3  months ago Patient left before evaluation by physician   [facility] Marcine Matar, MD   11 months ago Need for influenza vaccination   [facility] Lois Huxley, Cornelius Moras, RPH-CPP   11 months ago Essential hypertension   [facility] Marcine Matar, MD   1 year ago Annual physical exam   [facility] Marcine Matar, MD

## 2021-02-12 DIAGNOSIS — Z79899 Other long term (current) drug therapy: Secondary | ICD-10-CM | POA: Diagnosis not present

## 2021-02-16 DIAGNOSIS — Z79899 Other long term (current) drug therapy: Secondary | ICD-10-CM | POA: Diagnosis not present

## 2021-02-18 ENCOUNTER — Encounter (INDEPENDENT_AMBULATORY_CARE_PROVIDER_SITE_OTHER): Payer: Self-pay | Admitting: Family Medicine

## 2021-02-18 ENCOUNTER — Other Ambulatory Visit: Payer: Self-pay

## 2021-02-18 ENCOUNTER — Ambulatory Visit (INDEPENDENT_AMBULATORY_CARE_PROVIDER_SITE_OTHER): Payer: Medicaid Other | Admitting: Family Medicine

## 2021-02-18 VITALS — BP 131/65 | HR 65 | Temp 97.8°F | Ht 60.0 in | Wt 163.0 lb

## 2021-02-18 DIAGNOSIS — E669 Obesity, unspecified: Secondary | ICD-10-CM

## 2021-02-18 DIAGNOSIS — E8881 Metabolic syndrome: Secondary | ICD-10-CM | POA: Diagnosis not present

## 2021-02-18 DIAGNOSIS — Z6832 Body mass index (BMI) 32.0-32.9, adult: Secondary | ICD-10-CM

## 2021-02-18 DIAGNOSIS — E88819 Insulin resistance, unspecified: Secondary | ICD-10-CM

## 2021-02-18 NOTE — Progress Notes (Signed)
Chief Complaint:   OBESITY Rachel Hudson is here to discuss her progress with her obesity treatment plan along with follow-up of her obesity related diagnoses. Alpha is on practicing portion control and making smarter food choices, such as increasing vegetables and decreasing simple carbohydrates plus 80 grams of protein and states she is following her eating plan approximately 89% of the time. [name] states she is walking for 30-60 minutes 7 times per week.  Today's visit was #: 5 Starting weight: 166 lbs Starting date: 10/15/2020 Today's weight: 163 lbs Today's date: 02/18/2021 Total lbs lost to date: 3 lbs Total lbs lost since last in-office visit: 0  Interim History: [name] says she has no appetite and has to make her self eat. She eats 2 meals per day. She usually has 2 eggs and a piece of toast for breakfast. She is eating out 1 meal per day (usually fast food). She denies intake of sugar sweetened beverages. She has a hard time adhering to a structured plan.  Subjective:   1. Insulin resistance Treazure's last fasting Insulin is mildly elevated (7.6). Trulicity as prescribed last OV but was not covered by insurance.   Lab Results  Component Value Date   INSULIN 7.1 10/15/2020   Lab Results  Component Value Date   HGBA1C 5.5 10/15/2020    Assessment/Plan:   1. Insulin resistance [name] will continue to work on reducing weight loss, and decreasing simple carbohydrates to help decrease the risk of diabetes. She will increase protein. [name] agreed to follow-up with [name] as directed to closely monitor her progress.  2. Obesity: Current BMI 31.25 [name] is currently in the action stage of change. As such, her goal is to continue with weight loss efforts. She has agreed to practicing portion control and making smarter food choices, such as increasing vegetables and decreasing simple carbohydrates  (80 grams of protein).  Handouts: Eating out  Exercise goals:  As  is.  Behavioral modification strategies: decreasing eating out.  [name] has agreed to follow-up with our clinic in 3 weeks (fasting).  Objective:   Blood pressure 131/65, pulse 65, temperature 97.8 F (36.6 C), height 5' (1.524 m), weight 163 lb (73.9 kg), last menstrual period 09/29/2016, SpO2 96 %. Body mass index is 31.83 kg/m.  General: Cooperative, alert, well developed, in no acute distress. HEENT: Conjunctivae and lids unremarkable. Cardiovascular: Regular rhythm.  Lungs: Normal work of breathing. Neurologic: No focal deficits.   Lab Results  Component Value Date   CREATININE 0.70 10/15/2020   BUN 15 10/15/2020   NA 139 10/15/2020   K 4.4 10/15/2020   CL 101 10/15/2020   CO2 23 10/15/2020   Lab Results  Component Value Date   ALT 18 10/15/2020   AST 18 10/15/2020   ALKPHOS 88 10/15/2020   BILITOT 0.4 10/15/2020   Lab Results  Component Value Date   HGBA1C 5.5 10/15/2020   HGBA1C 5.4 04/06/2017   HGBA1C 5.7 (H) 09/28/2013   Lab Results  Component Value Date   INSULIN 7.1 10/15/2020   Lab Results  Component Value Date   TSH 1.640 10/15/2020   Lab Results  Component Value Date   CHOL 216 (H) 10/15/2020   HDL 51 10/15/2020   LDLCALC 151 (H) 10/15/2020   TRIG 78 10/15/2020   CHOLHDL 5.1 (H) 10/26/2019   Lab Results  Component Value Date   VD25OH 60.8 10/15/2020   Lab Results  Component Value Date   WBC 7.2 10/15/2020   HGB 14.3  10/15/2020   HCT 43.4 10/15/2020   MCV 91 10/15/2020   PLT 205 10/15/2020   Lab Results  Component Value Date   IRON 356 (H) 12/31/2013   Attestation Statements:   Reviewed by clinician on day of visit: allergies, medications, problem list, medical history, surgical history, family history, social history, and previous encounter notes.   I, Jackson Latino, RMA, am acting as Energy manager for [place], FNP.   I have reviewed the above documentation for accuracy and completeness, and I agree with the above.  -  Jesse Sans, FNP

## 2021-02-19 ENCOUNTER — Encounter (INDEPENDENT_AMBULATORY_CARE_PROVIDER_SITE_OTHER): Payer: Self-pay | Admitting: Family Medicine

## 2021-02-24 ENCOUNTER — Other Ambulatory Visit: Payer: Self-pay | Admitting: Internal Medicine

## 2021-02-24 DIAGNOSIS — M19042 Primary osteoarthritis, left hand: Secondary | ICD-10-CM

## 2021-02-24 DIAGNOSIS — M19041 Primary osteoarthritis, right hand: Secondary | ICD-10-CM

## 2021-03-09 ENCOUNTER — Ambulatory Visit (INDEPENDENT_AMBULATORY_CARE_PROVIDER_SITE_OTHER): Payer: Medicaid Other | Admitting: Family Medicine

## 2021-03-13 DIAGNOSIS — Z79899 Other long term (current) drug therapy: Secondary | ICD-10-CM | POA: Diagnosis not present

## 2021-03-17 ENCOUNTER — Encounter: Payer: Self-pay | Admitting: Internal Medicine

## 2021-03-17 ENCOUNTER — Telehealth: Payer: Self-pay

## 2021-03-17 ENCOUNTER — Other Ambulatory Visit: Payer: Self-pay

## 2021-03-17 ENCOUNTER — Ambulatory Visit: Payer: Medicaid Other | Admitting: Internal Medicine

## 2021-03-17 VITALS — BP 122/75 | HR 63 | Temp 97.3°F | Ht 60.0 in | Wt 166.6 lb

## 2021-03-17 DIAGNOSIS — R1319 Other dysphagia: Secondary | ICD-10-CM

## 2021-03-17 DIAGNOSIS — K7469 Other cirrhosis of liver: Secondary | ICD-10-CM | POA: Diagnosis not present

## 2021-03-17 DIAGNOSIS — K219 Gastro-esophageal reflux disease without esophagitis: Secondary | ICD-10-CM

## 2021-03-17 DIAGNOSIS — Z8619 Personal history of other infectious and parasitic diseases: Secondary | ICD-10-CM

## 2021-03-17 DIAGNOSIS — K7460 Unspecified cirrhosis of liver: Secondary | ICD-10-CM

## 2021-03-17 DIAGNOSIS — Z79899 Other long term (current) drug therapy: Secondary | ICD-10-CM | POA: Diagnosis not present

## 2021-03-17 DIAGNOSIS — R1310 Dysphagia, unspecified: Secondary | ICD-10-CM

## 2021-03-17 NOTE — Progress Notes (Signed)
Primary Care Physician:  Marcine Matar, MD Primary Gastroenterologist:  Dr. Jena Gauss  Pre-Procedure History & Physical: HPI:  Rachel Hudson is a 60 y.o. female here for follow-up of #/hep C related cirrhosis.  Status post eradication of HCV previously.  Unfortunately, continues to drink alcohol in the form of wine.  Vague dysphagia to liquids but not solids.  Previous esophageal dysphagia; normal esophagus on prior EGD  -symptoms responded nicely to a large bore Maloney dilation.  GERD well-controlled on pantoprazole 40 mg daily No esophageal varices seen on prior EGD.  Portal gastropathy present. Colonoscopy for screening 2017-diverticulosis; due for screening 2027. She denies illicit drug use. Historically low MELD.  Past Medical History:  Diagnosis Date   ADD (attention deficit disorder)    ADHD    Anxiety    Arthritis    Arthritis    Back pain    Bipolar 1 disorder (HCC)    Cirrhosis of liver (HCC) 01/2014   Stage 4   Foot pain, bilateral    GERD (gastroesophageal reflux disease)    Gout    Hand pain    Hep C w/o coma, chronic (HCC) As of 10/22/13   S/p treatment with Harvoni, achieved SVR   Hyperlipidemia    Hypertension    Joint pain    Liver problem    Osteoarthritis    Other fatigue    Shortness of breath    Shortness of breath on exertion    Vitamin D deficiency     Past Surgical History:  Procedure Laterality Date   BACK SURGERY     lumbar fusion   BIOPSY  09/12/2017   Procedure: BIOPSY;  Surgeon: Corbin Ade, MD;  [facility];  Service: Endoscopy;;  gastric   CARPAL TUNNEL RELEASE Right 11/13/2015   Procedure: RIGHT CARPAL TUNNEL RELEASE;  Surgeon: Betha Loa, MD;  Location: [facility];  Service: Orthopedics;  Laterality: Right;   CARPAL TUNNEL RELEASE Left 01/29/2016   Procedure: left CARPAL TUNNEL RELEASE;  Surgeon: Betha Loa, MD;  Location: [facility];  Service: Orthopedics;  Laterality: Left;    COLONOSCOPY N/A 07/08/2015   Surgeon: Corbin Ade, MD; internal hemorrhoids, colonic diverticulosis.  Repeat in 10 years.   ESOPHAGOGASTRODUODENOSCOPY N/A 07/08/2015   Surgeon: Corbin Ade, MD; erosive reflux esophagitis, hiatal hernia, gastric erosion and ulcer s/p biopsy (reactive gastropathy, negative H. pylori), no esophageal varices or portal hypertension.   ESOPHAGOGASTRODUODENOSCOPY (EGD) WITH PROPOFOL N/A 09/12/2017   Surgeon: Corbin Ade, MD; normal esophagus s/p dilation, portal hypertensive gastropathy, erosive gastropathy biopsied (negative for H. pylori), normal examined duodenum.   ESOPHAGOGASTRODUODENOSCOPY (EGD) WITH PROPOFOL N/A 10/25/2019   Surgeon: Corbin Ade, MD;  normal esophagus s/p dilation, mild erosive reflux esophagitis, portal hypertensive gastropathy, due for repeat in 2023.   FINGER ARTHROSCOPY WITH CARPOMETACARPEL [facility]) ARTHROPLASTY Right 04/24/2020   Procedure: RIGHT INDEX FINGER METACARPAL PHALANGEAL ARTHROPLASTY;  Surgeon: Betha Loa, MD;  Location: [facility];  Service: Orthopedics;  Laterality: Right;   LIGAMENT REPAIR Right 1980   Knee   MALONEY DILATION N/A 09/12/2017   Procedure: Elease Hashimoto DILATION;  Surgeon: Corbin Ade, MD;  [facility];  Service: Endoscopy;  Laterality: N/A;   MALONEY DILATION N/A 10/25/2019   Procedure: Elease Hashimoto DILATION;  Surgeon: Corbin Ade, MD;  [facility];  Service: Endoscopy;  Laterality: N/A;   TRIGGER FINGER RELEASE Right 04/24/2020   Procedure: RIGHT LONG TRIGGER  FINGER RELEASE TRIGGER FINGER/A-1 PULLEY;  Surgeon: Betha Loa, MD;  Location: [facility];  Service: Orthopedics;  Laterality: Right;    Prior to Admission medications   Medication Sig Start Date End Date Taking? Authorizing Provider  amLODipine (NORVASC) 10 MG tablet Take 1 tablet by mouth once daily 01/16/21  Yes Marcine Matar, MD  celecoxib (CELEBREX) 200 MG capsule Take 1 capsule by  mouth once daily 02/24/21  Yes Marcine Matar, MD  COLLAGEN PO Take 3 g by mouth daily.   Yes [provider]  ergocalciferol (VITAMIN D2) 1.25 MG (50000 UT) capsule Take 1 capsule by mouth once a week. 03/26/20  Yes [provider]  Flax Oil-Fish Oil-Borage Oil (FISH-FLAX-BORAGE PO) Take 1 capsule by mouth daily.   Yes [provider]  gabapentin (NEURONTIN) 800 MG tablet Take 1 tablet (800 mg total) by mouth every 6 (six) hours. 11/03/20  Yes Marcine Matar, MD  Lecith-Inosi-Chol-B12-Liver (LIVERITE) 1.125 MCG TABS Take 1 tablet by mouth 2 (two) times daily. LIVER AID   Yes [provider]  milk thistle 175 MG tablet Take 175 mg by mouth daily.   Yes [provider]  MITIGARE 0.6 MG CAPS Take 1 capsule by mouth twice daily 02/05/21  Yes Marcine Matar, MD  Multiple Vitamin (MULTIVITAMIN WITH MINERALS) TABS tablet Take 1 tablet by mouth daily.   Yes [provider]  OVER THE COUNTER MEDICATION salompad   Yes [provider]  Oxycodone HCl 10 MG TABS Take 10 mg by mouth 5 (five) times daily.   Yes [provider]  pantoprazole (PROTONIX) 40 MG tablet Take 1 tablet (40 mg total) by mouth 2 (two) times daily before a meal. 01/19/21  Yes Gelene Mink, NP  Tetrahydrozoline HCl (VISINE OP) Place 1 drop into both eyes daily as needed (allergies).   Yes [provider]    Allergies as of 03/17/2021 - Review Complete 03/17/2021  Allergen Reaction Noted   Penicillins  03/28/2011   Tylenol [acetaminophen] Other (See Comments) 07/14/2014    Family History  Adopted: Yes  Problem Relation Age of Onset   Colon cancer Neg Hx    Breast cancer Neg Hx     Social History   Socioeconomic History   Marital status: Single    Spouse name: Not on file   Number of children: Not on file   Years of education: Not on file   Highest education level: Not on file  Occupational History   Occupation: disabled  Tobacco Use    Smoking status: Never   Smokeless tobacco: Never  Vaping Use   Vaping Use: Never used  Substance and Sexual Activity   Alcohol use: Yes    Comment: 2 glasses of wine every other day   Drug use: Yes    Frequency: 7.0 times per week    Types: Marijuana    Comment: daily for arthritis   Sexual activity: Not Currently    Partners: Male    Birth control/protection: None, Condom  Other Topics Concern   Not on file  Social History Narrative   Not on file   Social Determinants of Health   Financial Resource Strain: Not on file  Food Insecurity: Not on file  Transportation Needs: Not on file  Physical Activity: Not on file  Stress: Not on file  Social Connections: Not on file  Intimate Partner Violence: Not on file    Review of Systems: See HPI, otherwise negative ROS  Physical Exam: BP 122/75   Pulse 63   Temp (!) 97.3 F (36.3 C)   Ht 5' (1.524 m)   Wt 166 lb 9.6 oz (75.6 kg)   LMP 09/29/2016 (Approximate)   BMI 32.54 kg/m  General:   Alert,  Well-developed, well-nourished, pleasant and cooperative in NAD Neck:  Supple; no masses or thyromegaly. No significant cervical adenopathy. Lungs:  Clear throughout to auscultation.   No wheezes, crackles, or rhonchi. No acute distress. Heart:  Regular rate and rhythm; no murmurs, clicks, rubs,  or gallops. Abdomen: Non-distended, normal bowel sounds.  Soft and nontender without appreciable mass or hepatosplenomegaly.  Pulses:  Normal pulses noted. Extremities:  Without clubbing or edema.  Impression/Plan: 60 year old lady with morphological and endoscopic changes to go along chronic liver disease/cirrhosis secondary to history of alcohol and HCV-the latter eradicated.  Continues to drink some alcohol in the form of wine.  She remains very well compensated.  We need to continue with hepatoma screening and should obtain updated lab work at this time.  Vague esophageal dysphagia to primary liquids which is interesting.   Normal-appearing esophagus on prior EGD.  She did respond esophageal dilation.  Normal colonoscopy (diverticulosis) 2017; due for screening 2027.  Long discussion with patient about the various forms of alcohol available to her in the environment.  It is important to avoid all forms including remedies.    Recommendations:  As discussed, no more alcohol.  Please!  Updated lab work needed:  CBC, Chem-12, INR and AFP  Screening hepatic ultrasound  Barium pill esophagram to further evaluate dysphagia to primarily liquids  Continue Protonix 40 mg daily  Screening colonoscopy 2027  Office visit with [name] in 6 months       Notice: This dictation was prepared with Dragon dictation along with smaller phrase technology. Any transcriptional errors that result from this process are unintentional and may not be corrected upon review.

## 2021-03-17 NOTE — Telephone Encounter (Signed)
[name] abd RUQ scheduled for 03/20/21 at 9:30am, arrive at 9:15am. NPO after midnight prior to test. BPE scheduled for 03/23/21 at 9:00am, arrive at 8:45am. NPO after midnight prior to test.   Pt requested for tests to be done at [facility].   Tried to call pt to inform her of [name] and BPE appts, left detailed message on VM to inform her of appts. Letters mailed.

## 2021-03-17 NOTE — Patient Instructions (Addendum)
Good to see you again today!  As discussed, no more alcohol.  Please!  Updated lab work needed:  CBC, Chem-12, INR and AFP  Screening hepatic ultrasound  Barium pill esophagram to further evaluate dysphagia to primarily liquids.  Continue Protonix 40 mg daily  Screening colonoscopy 2027  Office visit with [name] in 6 months

## 2021-03-18 ENCOUNTER — Other Ambulatory Visit: Payer: Self-pay | Admitting: Internal Medicine

## 2021-03-19 NOTE — Telephone Encounter (Signed)
Requested medications are due for refill today.  yes  Requested medications are on the active medications list.  yes  Last refill. 02/05/2021  Future visit scheduled.   yes  Notes to clinic.  Labs are overdue.

## 2021-03-20 ENCOUNTER — Other Ambulatory Visit: Payer: Self-pay | Admitting: Pharmacist

## 2021-03-20 ENCOUNTER — Ambulatory Visit (HOSPITAL_COMMUNITY): Payer: Medicaid Other

## 2021-03-20 MED ORDER — COLCHICINE 0.6 MG PO TABS: 0.6000 mg | ORAL_TABLET | Freq: Two times a day (BID) | ORAL | 0 refills | Status: DC

## 2021-03-23 ENCOUNTER — Other Ambulatory Visit: Payer: Self-pay

## 2021-03-23 ENCOUNTER — Ambulatory Visit (HOSPITAL_COMMUNITY)
Admission: RE | Admit: 2021-03-23 | Discharge: 2021-03-23 | Disposition: A | Discharge status: Home / Self Care | Payer: Medicaid Other | Source: Ambulatory Visit | Attending: Internal Medicine | Admitting: Internal Medicine

## 2021-03-23 DIAGNOSIS — R1310 Dysphagia, unspecified: Secondary | ICD-10-CM | POA: Insufficient documentation

## 2021-03-23 DIAGNOSIS — K224 Dyskinesia of esophagus: Secondary | ICD-10-CM | POA: Diagnosis not present

## 2021-03-25 ENCOUNTER — Ambulatory Visit (INDEPENDENT_AMBULATORY_CARE_PROVIDER_SITE_OTHER): Payer: Medicaid Other | Admitting: Family Medicine

## 2021-03-26 ENCOUNTER — Ambulatory Visit (HOSPITAL_COMMUNITY)
Admission: RE | Admit: 2021-03-26 | Discharge: 2021-03-26 | Disposition: A | Discharge status: Home / Self Care | Payer: Medicaid Other | Source: Ambulatory Visit | Attending: Internal Medicine | Admitting: Internal Medicine

## 2021-03-26 ENCOUNTER — Other Ambulatory Visit: Payer: Self-pay

## 2021-03-26 DIAGNOSIS — Z8619 Personal history of other infectious and parasitic diseases: Secondary | ICD-10-CM | POA: Diagnosis present

## 2021-03-26 DIAGNOSIS — K7460 Unspecified cirrhosis of liver: Secondary | ICD-10-CM | POA: Diagnosis not present

## 2021-03-30 ENCOUNTER — Encounter: Payer: Self-pay | Admitting: Internal Medicine

## 2021-03-30 ENCOUNTER — Ambulatory Visit: Payer: Medicaid Other | Attending: Internal Medicine | Admitting: Internal Medicine

## 2021-03-30 ENCOUNTER — Other Ambulatory Visit: Payer: Self-pay

## 2021-03-30 VITALS — BP 131/74 | HR 58 | Resp 16 | Wt 165.8 lb

## 2021-03-30 DIAGNOSIS — I10 Essential (primary) hypertension: Secondary | ICD-10-CM

## 2021-03-30 DIAGNOSIS — M65342 Trigger finger, left ring finger: Secondary | ICD-10-CM | POA: Diagnosis not present

## 2021-03-30 DIAGNOSIS — F1290 Cannabis use, unspecified, uncomplicated: Secondary | ICD-10-CM

## 2021-03-30 DIAGNOSIS — E669 Obesity, unspecified: Secondary | ICD-10-CM | POA: Diagnosis not present

## 2021-03-30 NOTE — Patient Instructions (Signed)

## 2021-03-30 NOTE — Progress Notes (Signed)
Patient ID: Rachel Hudson, female    DOB: Dec 08, 1960  MRN: [id number]  CC: chronic ds management  Subjective: Rachel Hudson is a 60 y.o. female who presents for chronic ds management Her concerns today include:  Patient with history of HTN, HL, hepatitis C (treated) with cirrhosis followed by GI, GERD and chronic lower back pain with hx surgery for spinal fixation of L4-L5, OA hands, bipolar affective ds, CTS hands.   Stopped going to [facility] because "I wasn't doing what they tell me to do."  She figured it did not make sense to keep going. Eats 2 meals a day. -walks about 2 miles every day but has not done so in 2 wks because she is renovating her house. Down 8 lbs since 04/2020 Last lipid revealed LDL 151  Feels she will need surgery on LT ring finger.  Still getting a lot of trigger finger  HTN:  compliant with Norvasc and salt restriction.  No CP/SOB/LE edema  Cirrhosis:  saw GI recently and had ultrasound for screening of HCC.  Ultrasound showed cirrhotic changes but no masses.  Wants to cut back on smoking marijuana.  She is inquiring whether there is any medication that she can take to help her cut down.  She feels however that the marijuana helps with arthritis pain.  HM: Reports having had flu and Pneumonia vaccines at [facility],   Patient Active Problem List   Diagnosis Date Noted   Bipolar 1 disorder (HCC) 01/28/2021   Insulin resistance 01/28/2021   Other hyperlipidemia 12/11/2020   Class 1 obesity with serious comorbidity and body mass index (BMI) of 32.0 to 32.9 in adult 12/03/2020   Influenza vaccine needed 02/26/2020   Bipolar affective disorder, currently manic, mild (HCC) 09/12/2017   Dysphagia 08/24/2017   Neuropathy of both feet 04/05/2017   Status post lumbar spinal fusion 10/22/2016   GERD (gastroesophageal reflux disease) 08/09/2016   Spondylolisthesis of lumbar region 07/29/2016   Sinus bradycardia 06/15/2016   Gingivitis 06/15/2016   ASCUS favor  benign 11/13/2015   History of hepatitis C 10/13/2015   Bilateral carpal tunnel syndrome 09/16/2015   Cervical spondylosis without myelopathy 09/16/2015   Primary osteoarthritis of both hands 09/16/2015   Diverticulosis of colon without hemorrhage    Peptic ulcer disease    Spondylosis of lumbar region without myelopathy or radiculopathy 03/21/2015   Lumbago 11/18/2014   Carpal tunnel syndrome 11/18/2014   Hepatic cirrhosis (HCC) 08/06/2014   HTN (hypertension) 02/28/2014     Current Outpatient Medications on File Prior to Visit  Medication Sig Dispense Refill   colchicine 0.6 MG tablet Take 1 tablet (0.6 mg total) by mouth 2 (two) times daily. 60 tablet 0   amLODipine (NORVASC) 10 MG tablet Take 1 tablet by mouth once daily 90 tablet 0   celecoxib (CELEBREX) 200 MG capsule Take 1 capsule by mouth once daily 30 capsule 0   COLLAGEN PO Take 3 g by mouth daily.     ergocalciferol (VITAMIN D2) 1.25 MG (50000 UT) capsule Take 1 capsule by mouth once a week.     Flax Oil-Fish Oil-Borage Oil (FISH-FLAX-BORAGE PO) Take 1 capsule by mouth daily.     gabapentin (NEURONTIN) 800 MG tablet Take 1 tablet (800 mg total) by mouth every 6 (six) hours. 120 tablet 3   Lecith-Inosi-Chol-B12-Liver (LIVERITE) 1.125 MCG TABS Take 1 tablet by mouth 2 (two) times daily. LIVER AID     milk thistle 175 MG tablet Take 175 mg by mouth  daily.     Multiple Vitamin (MULTIVITAMIN WITH MINERALS) TABS tablet Take 1 tablet by mouth daily.     OVER THE COUNTER MEDICATION salompad     Oxycodone HCl 10 MG TABS Take 10 mg by mouth 5 (five) times daily.     pantoprazole (PROTONIX) 40 MG tablet Take 1 tablet (40 mg total) by mouth 2 (two) times daily before a meal. 60 tablet 5   Tetrahydrozoline HCl (VISINE OP) Place 1 drop into both eyes daily as needed (allergies).     No current facility-administered medications on file prior to visit.    Allergies  Allergen Reactions   Penicillins     Facial swelling Has patient  had a PCN reaction causing immediate rash, facial/tongue/throat swelling, SOB or lightheadedness with hypotension: Yes Has patient had a PCN reaction causing severe rash involving mucus membranes or skin necrosis: No Has patient had a PCN reaction that required hospitalization No Has patient had a PCN reaction occurring within the last 10 years: Yes If all of the above answers are "NO", then may proceed with Cephalosporin use.    Tylenol [Acetaminophen] Other (See Comments)    HX of Hep. C    Social History   Socioeconomic History   Marital status: Single    Spouse name: Not on file   Number of children: Not on file   Years of education: Not on file   Highest education level: Not on file  Occupational History   Occupation: disabled  Tobacco Use   Smoking status: Never   Smokeless tobacco: Never  Vaping Use   Vaping Use: Never used  Substance and Sexual Activity   Alcohol use: Yes    Comment: 2 glasses of wine every other day   Drug use: Yes    Frequency: 7.0 times per week    Types: Marijuana    Comment: daily for arthritis   Sexual activity: Not Currently    Partners: Male    Birth control/protection: None, Condom  Other Topics Concern   Not on file  Social History Narrative   Not on file   Social Determinants of Health   Financial Resource Strain: Not on file  Food Insecurity: Not on file  Transportation Needs: Not on file  Physical Activity: Not on file  Stress: Not on file  Social Connections: Not on file  Intimate Partner Violence: Not on file    Family History  Adopted: Yes  Problem Relation Age of Onset   Colon cancer Neg Hx    Breast cancer Neg Hx     Past Surgical History:  Procedure Laterality Date   BACK SURGERY     lumbar fusion   BIOPSY  09/12/2017   Procedure: BIOPSY;  Surgeon: Corbin Ade, MD;  [facility];  Service: Endoscopy;;  gastric   CARPAL TUNNEL RELEASE Right 11/13/2015   Procedure: RIGHT CARPAL TUNNEL RELEASE;   Surgeon: Betha Loa, MD;  Location: [facility];  Service: Orthopedics;  Laterality: Right;   CARPAL TUNNEL RELEASE Left 01/29/2016   Procedure: left CARPAL TUNNEL RELEASE;  Surgeon: Betha Loa, MD;  Location: [facility];  Service: Orthopedics;  Laterality: Left;   COLONOSCOPY N/A 07/08/2015   Surgeon: Corbin Ade, MD; internal hemorrhoids, colonic diverticulosis.  Repeat in 10 years.   ESOPHAGOGASTRODUODENOSCOPY N/A 07/08/2015   Surgeon: Corbin Ade, MD; erosive reflux esophagitis, hiatal hernia, gastric erosion and ulcer s/p biopsy (reactive gastropathy, negative H. pylori), no esophageal varices or portal  hypertension.   ESOPHAGOGASTRODUODENOSCOPY (EGD) WITH PROPOFOL N/A 09/12/2017   Surgeon: Corbin Ade, MD; normal esophagus s/p dilation, portal hypertensive gastropathy, erosive gastropathy biopsied (negative for H. pylori), normal examined duodenum.   ESOPHAGOGASTRODUODENOSCOPY (EGD) WITH PROPOFOL N/A 10/25/2019   Surgeon: Corbin Ade, MD;  normal esophagus s/p dilation, mild erosive reflux esophagitis, portal hypertensive gastropathy, due for repeat in 2023.   FINGER ARTHROSCOPY WITH CARPOMETACARPEL [facility]) ARTHROPLASTY Right 04/24/2020   Procedure: RIGHT INDEX FINGER METACARPAL PHALANGEAL ARTHROPLASTY;  Surgeon: Betha Loa, MD;  Location: [facility];  Service: Orthopedics;  Laterality: Right;   LIGAMENT REPAIR Right 1980   Knee   MALONEY DILATION N/A 09/12/2017   Procedure: Elease Hashimoto DILATION;  Surgeon: Corbin Ade, MD;  [facility];  Service: Endoscopy;  Laterality: N/A;   MALONEY DILATION N/A 10/25/2019   Procedure: Elease Hashimoto DILATION;  Surgeon: Corbin Ade, MD;  [facility];  Service: Endoscopy;  Laterality: N/A;   TRIGGER FINGER RELEASE Right 04/24/2020   Procedure: RIGHT LONG TRIGGER FINGER RELEASE TRIGGER FINGER/A-1 PULLEY;  Surgeon: Betha Loa, MD;  Location: [facility];   Service: Orthopedics;  Laterality: Right;    ROS: Review of Systems Negative except as stated above  PHYSICAL EXAM: BP 131/74   Pulse (!) 58   Resp 16   Wt 165 lb 12.8 oz (75.2 kg)   LMP 09/29/2016 (Approximate)   SpO2 95%   BMI 32.38 kg/m   Wt Readings from Last 3 Encounters:  03/30/21 165 lb 12.8 oz (75.2 kg)  03/17/21 166 lb 9.6 oz (75.6 kg)  02/18/21 163 lb (73.9 kg)    Physical Exam  General appearance - alert, well appearing, and in no distress Mental status - normal mood, behavior, speech, dress, motor activity, and thought processes Neck - supple, no significant adenopathy Chest - clear to auscultation, no wheezes, rales or rhonchi, symmetric air entry Heart - normal rate, regular rhythm, normal S1, S2, no murmurs, rubs, clicks or gallops Musculoskeletal -left hand: She is able to bend the left ring finger at the PIP joint to only a certain degree. Extremities - peripheral pulses normal, no pedal edema, no clubbing or cyanosis   CMP Latest Ref Rng & Units 10/15/2020 08/14/2019 03/02/2018  Glucose 65 - 99 mg/dL 86 92 91  BUN 6 - 24 mg/dL 15 10 10   Creatinine 0.57 - 1.00 mg/dL 5.20 8.02 2.33  Sodium 134 - 144 mmol/L 139 140 140  Potassium 3.5 - 5.2 mmol/L 4.4 4.4 3.8  Chloride 96 - 106 mmol/L 101 104 101  CO2 20 - 29 mmol/L 23 26 25   Calcium 8.7 - 10.2 mg/dL 9.6 9.5 9.3  Total Protein 6.0 - 8.5 g/dL 7.6 7.3 7.5  Total Bilirubin 0.0 - 1.2 mg/dL 0.4 0.6 0.6  Alkaline Phos 44 - 121 IU/L 88 66 57  AST 0 - 40 IU/L 18 24 24   ALT 0 - 32 IU/L 18 21 18    Lipid Panel     Component Value Date/Time   CHOL 216 (H) 10/15/2020 0925   TRIG 78 10/15/2020 0925   HDL 51 10/15/2020 0925   CHOLHDL 5.1 (H) 10/26/2019 1022   CHOLHDL 2.8 09/28/2013 0904   VLDL 25 09/28/2013 0904   LDLCALC 151 (H) 10/15/2020 0925    CBC    Component Value Date/Time   WBC 7.2 10/15/2020 0925   WBC 7.5 08/14/2019 1105   RBC 4.79 10/15/2020 0925   RBC 4.67 08/14/2019 1105  HGB 14.3  10/15/2020 0925   HCT 43.4 10/15/2020 0925   PLT 205 10/15/2020 0925   MCV 91 10/15/2020 0925   MCH 29.9 10/15/2020 0925   MCH 29.1 08/14/2019 1105   MCHC 32.9 10/15/2020 0925   MCHC 32.7 08/14/2019 1105   RDW 13.4 10/15/2020 0925   LYMPHSABS 2.6 10/15/2020 0925   MONOABS 0.5 03/02/2018 0904   EOSABS 0.2 10/15/2020 0925   BASOSABS 0.0 10/15/2020 0925    ASSESSMENT AND PLAN: 1. Essential hypertension At goal.  Continue Norvasc.  2. Obesity (BMI 30.0-34.9) Commended her on the weight loss that she was able to achieve.  She looks like she has lost some inches also.  Encouraged her to continue regular exercise as she has been doing.  Dietary counseling given along with printed information.  3. Trigger ring finger of left hand Patient declines referral to orthopedics at this time  4. Marijuana user Advised patient that there is no pill that I am aware of that can decrease her craving for marijuana.  I recommend that she try cutting back by weaning herself.  She states she will try that.    Patient was given the opportunity to ask questions.  Patient verbalized understanding of the plan and was able to repeat key elements of the plan.   No orders of the defined types were placed in this encounter.    Requested Prescriptions    No prescriptions requested or ordered in this encounter    No follow-ups on file.  Jonah Blue, MD, FACP

## 2021-04-10 ENCOUNTER — Other Ambulatory Visit: Payer: Self-pay | Admitting: Internal Medicine

## 2021-04-10 DIAGNOSIS — M19041 Primary osteoarthritis, right hand: Secondary | ICD-10-CM

## 2021-04-10 DIAGNOSIS — M19042 Primary osteoarthritis, left hand: Secondary | ICD-10-CM

## 2021-04-10 NOTE — Telephone Encounter (Signed)
Requested Prescriptions  Pending Prescriptions Disp Refills  . celecoxib (CELEBREX) 200 MG capsule [Pharmacy Med Name: Celecoxib 200 MG Oral Capsule] 30 capsule 0    Sig: Take 1 capsule by mouth once daily     Analgesics:  COX2 Inhibitors Passed - 04/10/2021  8:42 AM      Passed - HGB in normal range and within 360 days    Hemoglobin  Date Value Ref Range Status  10/15/2020 14.3 11.1 - 15.9 g/dL Final         Passed - Cr in normal range and within 360 days    Creat  Date Value Ref Range Status  06/15/2016 0.76 0.50 - 1.05 mg/dL Final    Comment:      For patients > or = 59 years of age: The upper reference limit for Creatinine is approximately 13% higher for people identified as African-American.      Creatinine, Ser  Date Value Ref Range Status  10/15/2020 0.70 0.57 - 1.00 mg/dL Final         Passed - Patient is not pregnant      Passed - Valid encounter within last 12 months    Recent Outpatient Visits          1 week ago Essential hypertension   [facility] Marcine Matar, MD   5 months ago Essential hypertension   [facility] Marcine Matar, MD   5 months ago Patient left before evaluation by physician   [facility] Marcine Matar, MD   1 year ago Need for influenza vaccination   [facility] Lois Huxley, Cornelius Moras, RPH-CPP   1 year ago Essential hypertension   [facility] Marcine Matar, MD

## 2021-04-13 DIAGNOSIS — M130 Polyarthritis, unspecified: Secondary | ICD-10-CM | POA: Diagnosis not present

## 2021-04-13 DIAGNOSIS — Z9889 Other specified postprocedural states: Secondary | ICD-10-CM | POA: Diagnosis not present

## 2021-04-13 DIAGNOSIS — R202 Paresthesia of skin: Secondary | ICD-10-CM | POA: Diagnosis not present

## 2021-04-13 DIAGNOSIS — Z96653 Presence of artificial knee joint, bilateral: Secondary | ICD-10-CM | POA: Diagnosis not present

## 2021-04-13 DIAGNOSIS — Z79899 Other long term (current) drug therapy: Secondary | ICD-10-CM | POA: Diagnosis not present

## 2021-04-24 ENCOUNTER — Other Ambulatory Visit: Payer: Self-pay | Admitting: Internal Medicine

## 2021-04-24 DIAGNOSIS — G5603 Carpal tunnel syndrome, bilateral upper limbs: Secondary | ICD-10-CM

## 2021-04-24 DIAGNOSIS — M47816 Spondylosis without myelopathy or radiculopathy, lumbar region: Secondary | ICD-10-CM

## 2021-05-13 DIAGNOSIS — Z79899 Other long term (current) drug therapy: Secondary | ICD-10-CM | POA: Diagnosis not present

## 2021-05-13 DIAGNOSIS — E559 Vitamin D deficiency, unspecified: Secondary | ICD-10-CM | POA: Diagnosis not present

## 2021-05-14 DIAGNOSIS — Z79899 Other long term (current) drug therapy: Secondary | ICD-10-CM | POA: Diagnosis not present

## 2021-06-18 DIAGNOSIS — Z79899 Other long term (current) drug therapy: Secondary | ICD-10-CM | POA: Diagnosis not present

## 2021-06-22 DIAGNOSIS — Z79899 Other long term (current) drug therapy: Secondary | ICD-10-CM | POA: Diagnosis not present

## 2021-07-21 DIAGNOSIS — Z79899 Other long term (current) drug therapy: Secondary | ICD-10-CM | POA: Diagnosis not present

## 2021-07-23 DIAGNOSIS — Z79899 Other long term (current) drug therapy: Secondary | ICD-10-CM | POA: Diagnosis not present

## 2021-07-24 ENCOUNTER — Other Ambulatory Visit: Payer: Self-pay | Admitting: Internal Medicine

## 2021-07-24 DIAGNOSIS — M19042 Primary osteoarthritis, left hand: Secondary | ICD-10-CM

## 2021-07-24 DIAGNOSIS — M19041 Primary osteoarthritis, right hand: Secondary | ICD-10-CM

## 2021-07-24 NOTE — Telephone Encounter (Signed)
Requested Prescriptions  ?Pending Prescriptions Disp Refills  ?? celecoxib (CELEBREX) 200 MG capsule [Pharmacy Med Name: Celecoxib 200 MG Oral Capsule] 90 capsule 0  ?  Sig: Take 1 capsule by mouth once daily  ?  ? Analgesics:  COX2 Inhibitors Failed - 07/24/2021 12:25 PM  ?  ?  Failed - Manual Review: Labs are only required if the patient has taken medication for more than 8 weeks.  ?  ?  Passed - HGB in normal range and within 360 days  ?  Hemoglobin  ?Date Value Ref Range Status  ?10/15/2020 14.3 11.1 - 15.9 g/dL Final  ?   ?  ?  Passed - Cr in normal range and within 360 days  ?  Creat  ?Date Value Ref Range Status  ?06/15/2016 0.76 0.50 - 1.05 mg/dL Final  ?  Comment:  ?    ?For patients > or = 60 years of age: The upper reference limit for ?Creatinine is approximately 13% higher for people identified as ?African-American. ?  ?  ? ?Creatinine, Ser  ?Date Value Ref Range Status  ?10/15/2020 0.70 0.57 - 1.00 mg/dL Final  ?   ?  ?  Passed - HCT in normal range and within 360 days  ?  Hematocrit  ?Date Value Ref Range Status  ?10/15/2020 43.4 34.0 - 46.6 % Final  ?   ?  ?  Passed - AST in normal range and within 360 days  ?  AST  ?Date Value Ref Range Status  ?10/15/2020 18 0 - 40 IU/L Final  ?   ?  ?  Passed - ALT in normal range and within 360 days  ?  ALT  ?Date Value Ref Range Status  ?10/15/2020 18 0 - 32 IU/L Final  ?   ?  ?  Passed - eGFR is 30 or above and within 360 days  ?  GFR, Est African American  ?Date Value Ref Range Status  ?06/15/2016 >89 >=60 mL/min Final  ? ?GFR calc Af Amer  ?Date Value Ref Range Status  ?08/14/2019 >60 >60 mL/min Final  ? ?GFR, Est Non African American  ?Date Value Ref Range Status  ?06/15/2016 89 >=60 mL/min Final  ? ?GFR calc non Af Amer  ?Date Value Ref Range Status  ?08/14/2019 >60 >60 mL/min Final  ? ?eGFR  ?Date Value Ref Range Status  ?10/15/2020 100 >59 mL/min/1.73 Final  ?   ?  ?  Passed - Patient is not pregnant  ?  ?  Passed - Valid encounter within last 12 months  ?   Recent Outpatient Visits   ?      ? 3 months ago Essential hypertension  ? [facility] Johnson, Deborah B, MD  ? 8 months ago Essential hypertension  ? [facility] Johnson, Deborah B, MD  ? 8 months ago Patient left before evaluation by physician  ? [facility] Johnson, Deborah B, MD  ? 1 year ago Need for influenza vaccination  ? [facility] Van Ausdall, Stephen L, RPH-CPP  ? 1 year ago Essential hypertension  ? [facility] Johnson, Deborah B, MD  ?  ?  ? ?  ?  ?  ? ? ?

## 2021-08-13 ENCOUNTER — Other Ambulatory Visit: Payer: Self-pay | Admitting: Internal Medicine

## 2021-08-18 DIAGNOSIS — Z79899 Other long term (current) drug therapy: Secondary | ICD-10-CM | POA: Diagnosis not present

## 2021-08-24 ENCOUNTER — Other Ambulatory Visit: Payer: Self-pay | Admitting: Internal Medicine

## 2021-08-24 DIAGNOSIS — I10 Essential (primary) hypertension: Secondary | ICD-10-CM

## 2021-08-24 DIAGNOSIS — Z79899 Other long term (current) drug therapy: Secondary | ICD-10-CM | POA: Diagnosis not present

## 2021-08-25 NOTE — Telephone Encounter (Signed)
Requested Prescriptions  ?Pending Prescriptions Disp Refills  ?? amLODipine (NORVASC) 10 MG tablet [Pharmacy Med Name: amLODIPine Besylate 10 MG Oral Tablet] 90 tablet 0  ?  Sig: Take 1 tablet by mouth once daily  ?  ? Cardiovascular: Calcium Channel Blockers 2 Passed - 08/24/2021  9:34 AM  ?  ?  Passed - Last BP in normal range  ?  BP Readings from Last 1 Encounters:  ?03/30/21 131/74  ?   ?  ?  Passed - Last Heart Rate in normal range  ?  Pulse Readings from Last 1 Encounters:  ?03/30/21 (!) 58  ?   ?  ?  Passed - Valid encounter within last 6 months  ?  Recent Outpatient Visits   ?      ? 4 months ago Essential hypertension  ? [facility] Marcine Matar, MD  ? 9 months ago Essential hypertension  ? [facility] Marcine Matar, MD  ? 9 months ago Patient left before evaluation by physician  ? [facility] Marcine Matar, MD  ? 1 year ago Need for influenza vaccination  ? [facility] Lois Huxley, Cornelius Moras, RPH-CPP  ? 1 year ago Essential hypertension  ? [facility] Marcine Matar, MD  ?  ?  ? ?  ?  ?  ? ?

## 2021-09-15 ENCOUNTER — Encounter: Payer: Self-pay | Admitting: Gastroenterology

## 2021-09-15 ENCOUNTER — Encounter: Payer: Self-pay | Admitting: *Deleted

## 2021-09-15 ENCOUNTER — Ambulatory Visit: Payer: Medicaid Other | Admitting: Gastroenterology

## 2021-09-15 VITALS — BP 132/80 | HR 71 | Temp 97.3°F | Ht 61.0 in | Wt 175.4 lb

## 2021-09-15 DIAGNOSIS — R1011 Right upper quadrant pain: Secondary | ICD-10-CM | POA: Diagnosis not present

## 2021-09-15 DIAGNOSIS — K7460 Unspecified cirrhosis of liver: Secondary | ICD-10-CM | POA: Diagnosis not present

## 2021-09-15 DIAGNOSIS — Z8619 Personal history of other infectious and parasitic diseases: Secondary | ICD-10-CM

## 2021-09-15 DIAGNOSIS — K219 Gastro-esophageal reflux disease without esophagitis: Secondary | ICD-10-CM

## 2021-09-15 NOTE — Patient Instructions (Signed)
Please have your labs done. If you do not hear from [name] about results, please call and make sure we received results especially if you choose to have done at Dr. Henriette Combs.  ?Ultrasound of your abdomen as planned. ?I continue to encourage you to avoid alcohol as this will continue to cause harm to the liver. It will also make you unable to receive a liver transplant in the future if you ever needed one.  ?Return to the office in six months or call sooner if needed.  ? ?Cirrhosis ? ?Cirrhosis is long-term (chronic) liver injury. The liver is the body's largest internal organ, and it performs many functions. It converts food into energy, removes toxic material from the blood, makes important proteins, and absorbs necessary vitamins from food. ?In cirrhosis, healthy liver cells are replaced by scar tissue. This prevents blood from flowing through the liver and makes it difficult for the liver to complete its functions. ?What are the causes? ?Common causes of this condition are hepatitis C and long-term alcohol abuse. Other causes include: ?Nonalcoholic fatty liver disease (NAFLD). This happens when fat is deposited in the liver by causes other than alcohol. ?Hepatitis B infection. ?Autoimmune hepatitis. In this condition, the body's defense system (immune system) mistakenly attacks the liver cells, causing inflammation. ?Diseases that cause blockage of ducts inside the liver. ?Inherited liver diseases, such as hemochromatosis. This is one of the most common inherited liver diseases. In this disease, deposits of iron collect in the liver and other organs. ?Reactions to certain long-term medicines, such as amiodarone, a heart medicine. ?Parasitic infections. These include schistosomiasis, which is caused by a flatworm. ?Long-term contact to certain toxins. These toxins include certain organic solvents, such as toluene and chloroform. ?What increases the risk? ?You are more likely to develop this condition if: ?You have  certain types of viral hepatitis. ?You abuse alcohol, especially if you are female. ?You are overweight. ?You use IV drugs and share needles. ?You have unprotected sex with someone who has viral hepatitis. ?What are the signs or symptoms? ?You may not have any signs and symptoms at first. Symptoms may not develop until the damage to your liver starts to get worse. ?Early symptoms may include: ?Weakness and tiredness (fatigue). ?Changes in sleep patterns or having trouble sleeping. ?Itchiness. ?Tenderness in the right-upper part of your abdomen. ?Weight loss and muscle loss. ?Nausea. ?Loss of appetite. ?Later symptoms may include: ?Fatigue or weakness that is getting worse. ?Yellow skin and eyes (jaundice). ?Buildup of fluid in the abdomen (ascites). You may notice that your clothes are tight around your waist. ?Weight gain and swelling of the feet and ankles (edema). ?Trouble breathing. ?Easy bruising and bleeding. ?Vomiting blood, or black or bloody stool. ?Mental confusion. ?How is this diagnosed? ?Your health care provider may suspect cirrhosis based on your symptoms and medical history, especially if you have other medical conditions or a history of alcohol abuse. Your health care provider will do a physical exam to feel your liver and to check for signs of cirrhosis. Tests may include: ?Blood tests to check: ?For hepatitis B or C. ?Kidney function. ?Liver function. ?Imaging tests such as: ?MRI or CT scan to look for changes seen in advanced cirrhosis. ?Ultrasound to see if normal liver tissue is being replaced by scar tissue. ?A procedure in which a long needle is used to take a sample of liver tissue to be checked in a lab (biopsy). Liver biopsy can confirm the diagnosis of cirrhosis. ?How is this treated? ?Treatment  for this condition depends on how damaged your liver is and what caused the damage. It may include treating the symptoms of cirrhosis, or treating the underlying causes to slow the damage.  Treatment may include: ?Making lifestyle changes, such as: ?Eating a healthy diet. You may need to work with your health care provider or a dietitian to develop an eating plan. ?Restricting salt intake. ?Maintaining a healthy weight. ?Not abusing drugs or alcohol. ?Taking medicines to: ?Treat liver infections or other infections. ?Control itching. ?Reduce fluid buildup. ?Reduce certain blood toxins. ?Reduce risk of bleeding from enlarged blood vessels in the stomach or esophagus (varices). ?Liver transplant. In this procedure, a liver from a donor is used to replace your diseased liver. This is done if cirrhosis has caused liver failure. ?Other treatments and procedures may be done depending on the problems that you get from cirrhosis. Common problems include liver-related kidney failure (hepatorenal syndrome). ?Follow these instructions at home: ? ?Take medicines only as told by your health care provider. Do not use medicines that are toxic to your liver. Ask your health care provider before taking any new medicines, including over-the-counter medicines such as NSAIDs. ?Rest as needed. ?Eat a well-balanced diet. ?Limit your salt or water intake, if your health care provider asks you to do this. ?Do not drink alcohol. This is especially important if you routinely take acetaminophen. ?Keep all follow-up visits. This is important. ?Contact a health care provider if you: ?Have fatigue or weakness that is getting worse. ?Develop swelling of the hands, feet, or legs, or a buildup of fluid in the abdomen (ascites). ?Have a fever or chills. ?Develop loss of appetite. ?Have nausea or vomiting. ?Develop jaundice. ?Develop easy bruising or bleeding. ?Get help right away if you: ?Vomit bright red blood or a material that looks like coffee grounds. ?Have blood in your stools. ?Notice that your stools appear black and tarry. ?Become confused. ?Have chest pain or trouble breathing. ?These symptoms may represent a serious problem  that is an emergency. Do not wait to see if the symptoms will go away. Get medical help right away. Call your local emergency services (911 in the [place] Do not drive yourself to the hospital. ?Summary ?Cirrhosis is chronic liver injury. Common causes are hepatitis C and long-term alcohol abuse. ?Tests used to diagnose cirrhosis include blood tests, imaging tests, and liver biopsy. ?Treatment for this condition involves treating the underlying cause. Avoid alcohol, drugs, salt, and medicines that may damage your liver. ?Get help right away if you vomit bright red blood or a material that looks like coffee grounds. ?This information is not intended to replace advice given to you by your health care provider. Make sure you discuss any questions you have with your health care provider. ?Document Revised: 02/21/2020 Document Reviewed: 02/21/2020 ?Elsevier Patient Education ? 2023 [employer] ? ?

## 2021-09-15 NOTE — Progress Notes (Signed)
? ? ? ?GI Office Note   ? ?Referring Provider: Marcine Matar, MD ?Primary Care Physician:  Marcine Matar, MD  ?Primary Gastroenterologist: Roetta Sessions, MD ? ? ?Chief Complaint  ? ?Chief Complaint  ?Patient presents with  ? Abdominal Pain  ?  Right side pain going on for a couple months.   ? ? ?History of Present Illness  ? ?Rachel Hudson is a 61 y.o. female presenting today for follow-up of hepatitis C related cirrhosis.  Status post eradication previously.  Continues to drink wine.  Previous esophageal dysphagia, normal esophagus on prior EGD.  Responded nicely to dilation at first. BPE for recurrent dysphagia 02/2021 showed no stricture but she had moderate esophageal dysmotility. GERD well controlled on pantoprazole.  No esophageal varices seen on EGD.  Due for next colonoscopy in 2027. ? ?Right sided pain for two months but better now. Worse with laying down. Meals do not affect it. Usually 1-2 glasses of wine daily, feels like makes it worse. Worse with anxiety. Sometimes drinks more if no marijuana available. Sometimes drink no etoh in a day. Acid reflux ok. BMs normal. No melena, brbpr. Weight up 10 pounds. Denies significant swallowing concerns.   ? ?Last hepatoma screening November 2022, findings consistent with cirrhotic liver with hepatic steatosis, no focal liver tumors.  She did not complete blood work. ? ?Medications  ? ?Current Outpatient Medications  ?Medication Sig Dispense Refill  ? amLODipine (NORVASC) 10 MG tablet Take 1 tablet by mouth once daily 90 tablet 0  ? celecoxib (CELEBREX) 200 MG capsule Take 1 capsule by mouth once daily 90 capsule 0  ? colchicine 0.6 MG tablet Take 1 tablet by mouth twice daily 60 tablet 0  ? COLLAGEN PO Take 3 g by mouth daily.    ? ergocalciferol (VITAMIN D2) 1.25 MG (50000 UT) capsule Take 1 capsule by mouth once a week.    ? Flax Oil-Fish Oil-Borage Oil (FISH-FLAX-BORAGE PO) Take 1 capsule by mouth daily.    ? gabapentin (NEURONTIN) 800 MG tablet  TAKE 1 TABLET BY MOUTH EVERY 6 HOURS 120 tablet 6  ? Lecith-Inosi-Chol-B12-Liver (LIVERITE) 1.125 MCG TABS Take 1 tablet by mouth 2 (two) times daily. LIVER AID    ? milk thistle 175 MG tablet Take 175 mg by mouth daily.    ? Multiple Vitamin (MULTIVITAMIN WITH MINERALS) TABS tablet Take 1 tablet by mouth daily.    ? OVER THE COUNTER MEDICATION salompad    ? Oxycodone HCl 10 MG TABS Take 10 mg by mouth 5 (five) times daily.    ? pantoprazole (PROTONIX) 40 MG tablet Take 1 tablet (40 mg total) by mouth 2 (two) times daily before a meal. 60 tablet 5  ? ?No current facility-administered medications for this visit.  ? ? ?Allergies  ? ?Allergies as of 09/15/2021 - Review Complete 09/15/2021  ?Allergen Reaction Noted  ? Penicillins  03/28/2011  ? Tylenol [acetaminophen] Other (See Comments) 07/14/2014  ? ?   ?Review of Systems  ? ?General: Negative for anorexia, weight loss, fever, chills, fatigue, weakness. ?ENT: Negative for hoarseness, difficulty swallowing , nasal congestion. ?CV: Negative for chest pain, angina, palpitations, dyspnea on exertion, peripheral edema.  ?Respiratory: Negative for dyspnea at rest, dyspnea on exertion, cough, sputum, wheezing.  ?GI: See history of present illness. ?GU:  Negative for dysuria, hematuria, urinary incontinence, urinary frequency, nocturnal urination.  ?Endo: Negative for unusual weight change.  ?   ?Physical Exam  ? ?BP 132/80 (BP Location: Right Arm,  Patient Position: Sitting, Cuff Size: Normal)   Pulse 71   Temp (!) 97.3 ?F (36.3 ?C) (Temporal)   Ht 5\' 1"  (1.549 m)   Wt 175 lb 6.4 oz (79.6 kg)   LMP 09/29/2016 (Approximate)   SpO2 97%   BMI 33.14 kg/m?  ?  ?General: Well-nourished, well-developed in no acute distress.  ?Eyes: No icterus. ?Mouth: Oropharyngeal mucosa moist and pink , no lesions erythema or exudate. ?Lungs: Clear to auscultation bilaterally.  ?Heart: Regular rate and rhythm, no murmurs rubs or gallops.  ?Abdomen: Bowel sounds are normal, nontender,  nondistended, no hepatosplenomegaly or masses,  ?no abdominal bruits or hernia, no rebound or guarding.  ?Rectal: not performed ?Extremities: No lower extremity edema. No clubbing or deformities. ?Neuro: Alert and oriented x 4   ?Skin: Warm and dry, no jaundice.   ?Psych: Alert and cooperative, normal mood and affect. ? ?Labs  ? ?Lab Results  ?Component Value Date  ? CREATININE 0.70 10/15/2020  ? BUN 15 10/15/2020  ? NA 139 10/15/2020  ? K 4.4 10/15/2020  ? CL 101 10/15/2020  ? CO2 23 10/15/2020  ? ?Lab Results  ?Component Value Date  ? WBC 7.2 10/15/2020  ? HGB 14.3 10/15/2020  ? HCT 43.4 10/15/2020  ? MCV 91 10/15/2020  ? PLT 205 10/15/2020  ? ?Lab Results  ?Component Value Date  ? ALT 18 10/15/2020  ? AST 18 10/15/2020  ? ALKPHOS 88 10/15/2020  ? BILITOT 0.4 10/15/2020  ? ? ?Imaging Studies  ? ?No results found. ? ?Assessment  ? ? RUQ pain: patient states symptoms worse with increased etoh recently. Encouraged etoh cessation. Continue PPI BID. RUQ u/s as planned as well as labs.  ? ?GERD: continue PPI BID for now. Reinforced antireflux measures. Hopefully can wean her down to once daily dosing soon.  ? ?Cirrhosis with h/o Hep C: s/p Hep C eradication. Overdue for hepatoma screening and labs. Unfortunately she still drinks alcohol.  ? ? ?PLAN  ? ?Update labs, patient plans to have done with PCP at [facility].  ?Abd u/s. ?Encouraged complete etoh cessation.  ?Return to office in six months or call sooner if needed. ? ? ?[facility]. Ramesha Poster, MHS, PA-C ?[facility] ? ?

## 2021-09-17 ENCOUNTER — Ambulatory Visit (HOSPITAL_COMMUNITY)
Admission: RE | Admit: 2021-09-17 | Discharge: 2021-09-17 | Disposition: A | Discharge status: Home / Self Care | Payer: Medicaid Other | Source: Ambulatory Visit | Attending: Gastroenterology | Admitting: Gastroenterology

## 2021-09-17 DIAGNOSIS — K7689 Other specified diseases of liver: Secondary | ICD-10-CM | POA: Diagnosis not present

## 2021-09-17 DIAGNOSIS — K219 Gastro-esophageal reflux disease without esophagitis: Secondary | ICD-10-CM | POA: Insufficient documentation

## 2021-09-17 DIAGNOSIS — R1011 Right upper quadrant pain: Secondary | ICD-10-CM | POA: Insufficient documentation

## 2021-09-17 DIAGNOSIS — K7460 Unspecified cirrhosis of liver: Secondary | ICD-10-CM | POA: Diagnosis present

## 2021-09-17 DIAGNOSIS — Z8619 Personal history of other infectious and parasitic diseases: Secondary | ICD-10-CM | POA: Diagnosis present

## 2021-09-18 DIAGNOSIS — Z79899 Other long term (current) drug therapy: Secondary | ICD-10-CM | POA: Diagnosis not present

## 2021-09-18 DIAGNOSIS — E8809 Other disorders of plasma-protein metabolism, not elsewhere classified: Secondary | ICD-10-CM | POA: Diagnosis not present

## 2021-09-22 ENCOUNTER — Telehealth: Payer: Self-pay | Admitting: Internal Medicine

## 2021-09-22 DIAGNOSIS — Z79899 Other long term (current) drug therapy: Secondary | ICD-10-CM | POA: Diagnosis not present

## 2021-09-22 NOTE — Telephone Encounter (Signed)
Recall for ultrasound 

## 2021-09-22 NOTE — Telephone Encounter (Signed)
Pt had US done 4/27 ?

## 2021-09-26 ENCOUNTER — Other Ambulatory Visit: Payer: Self-pay | Admitting: Gastroenterology

## 2021-09-26 ENCOUNTER — Other Ambulatory Visit: Payer: Self-pay | Admitting: Internal Medicine

## 2021-09-26 ENCOUNTER — Encounter: Payer: Self-pay | Admitting: Gastroenterology

## 2021-10-06 ENCOUNTER — Other Ambulatory Visit: Payer: Self-pay | Admitting: Internal Medicine

## 2021-10-19 DIAGNOSIS — Z79899 Other long term (current) drug therapy: Secondary | ICD-10-CM | POA: Diagnosis not present

## 2021-10-21 DIAGNOSIS — Z79899 Other long term (current) drug therapy: Secondary | ICD-10-CM | POA: Diagnosis not present

## 2021-10-23 ENCOUNTER — Ambulatory Visit: Payer: Medicaid Other | Attending: Internal Medicine | Admitting: Internal Medicine

## 2021-10-23 ENCOUNTER — Encounter: Payer: Self-pay | Admitting: Internal Medicine

## 2021-10-23 VITALS — BP 140/80 | HR 63 | Temp 98.2°F | Resp 16 | Wt 169.0 lb

## 2021-10-23 DIAGNOSIS — I10 Essential (primary) hypertension: Secondary | ICD-10-CM

## 2021-10-23 DIAGNOSIS — M79675 Pain in left toe(s): Secondary | ICD-10-CM | POA: Diagnosis not present

## 2021-10-23 DIAGNOSIS — M79674 Pain in right toe(s): Secondary | ICD-10-CM | POA: Diagnosis not present

## 2021-10-23 DIAGNOSIS — M549 Dorsalgia, unspecified: Secondary | ICD-10-CM

## 2021-10-23 DIAGNOSIS — G8929 Other chronic pain: Secondary | ICD-10-CM | POA: Diagnosis not present

## 2021-10-23 NOTE — Progress Notes (Signed)
Patient ID: Rachel Hudson, female    DOB: 11-23-1960  MRN: [id number]  CC: Pain   Subjective: Rachel Hudson is a 61 y.o. female who presents for chronic disease management Her concerns today include:  Patient with history of HTN, HL, hepatitis C (treated) with cirrhosis followed by GI, GERD and chronic lower back pain with hx surgery for spinal fixation of L4-L5, OA hands, bipolar affective ds, CTS hands.  [facility].   Main concern today is pain in the right buttock that radiates up towards the lumbar region x2 months.  Intermittent.  Started after she fell off of a stool that was about 2 to 3 feet high while hanging a plant a few months ago.  No numbness or tingling.  Also complains of pain in all 5 toes that goes and comes times a few months.  She feels all of her symptoms are coming from the back.  She has history of chronic back pain from previous surgery.  In pain management with [facility].  On oxycodone.  Also on Celebrex and gabapentin.  Requests letter to be excused from jury duty.  Due to her chronic back pain she does not feel that she can sit for hours at a time.  HTN: Compliant with Norvasc but did not take this yet for today.  Does not check blood pressure but reports that it has been good on visits that she has had with her specialists.  She tries to limit salt in the foods.  Weight down 6 pounds since last month. Patient Active Problem List   Diagnosis Date Noted   Marijuana user 03/30/2021   Bipolar 1 disorder (HCC) 01/28/2021   Insulin resistance 01/28/2021   Other hyperlipidemia 12/11/2020   Class 1 obesity with serious comorbidity and body mass index (BMI) of 32.0 to 32.9 in adult 12/03/2020   Influenza vaccine needed 02/26/2020   Bipolar affective disorder, currently manic, mild (HCC) 09/12/2017   Dysphagia 08/24/2017   Neuropathy of both feet 04/05/2017   Status post lumbar spinal fusion 10/22/2016   GERD (gastroesophageal reflux  disease) 08/09/2016   Spondylolisthesis of lumbar region 07/29/2016   Sinus bradycardia 06/15/2016   Gingivitis 06/15/2016   ASCUS favor benign 11/13/2015   History of hepatitis C 10/13/2015   Bilateral carpal tunnel syndrome 09/16/2015   Cervical spondylosis without myelopathy 09/16/2015   Primary osteoarthritis of both hands 09/16/2015   Diverticulosis of colon without hemorrhage    Peptic ulcer disease    Spondylosis of lumbar region without myelopathy or radiculopathy 03/21/2015   Lumbago 11/18/2014   Carpal tunnel syndrome 11/18/2014   Hepatic cirrhosis (HCC) 08/06/2014   HTN (hypertension) 02/28/2014     Current Outpatient Medications on File Prior to Visit  Medication Sig Dispense Refill   amLODipine (NORVASC) 10 MG tablet Take 1 tablet by mouth once daily 90 tablet 0   celecoxib (CELEBREX) 200 MG capsule Take 1 capsule by mouth once daily 90 capsule 0   colchicine 0.6 MG tablet Take 1 tablet (0.6 mg total) by mouth 2 (two) times daily. 60 tablet 0   COLLAGEN PO Take 3 g by mouth daily.     ergocalciferol (VITAMIN D2) 1.25 MG (50000 UT) capsule Take 1 capsule by mouth once a week.     Flax Oil-Fish Oil-Borage Oil (FISH-FLAX-BORAGE PO) Take 1 capsule by mouth daily.     gabapentin (NEURONTIN) 800 MG tablet TAKE 1 TABLET BY MOUTH EVERY 6 HOURS 120 tablet 6  Lecith-Inosi-Chol-B12-Liver (LIVERITE) 1.125 MCG TABS Take 1 tablet by mouth 2 (two) times daily. LIVER AID     milk thistle 175 MG tablet Take 175 mg by mouth daily.     Multiple Vitamin (MULTIVITAMIN WITH MINERALS) TABS tablet Take 1 tablet by mouth daily.     OVER THE COUNTER MEDICATION salompad     Oxycodone HCl 10 MG TABS Take 10 mg by mouth 5 (five) times daily.     pantoprazole (PROTONIX) 40 MG tablet TAKE 1 TABLET BY MOUTH TWICE DAILY BEFORE A MEAL 60 tablet 5   No current facility-administered medications on file prior to visit.    Allergies  Allergen Reactions   Penicillins     Facial swelling Has patient  had a PCN reaction causing immediate rash, facial/tongue/throat swelling, SOB or lightheadedness with hypotension: Yes Has patient had a PCN reaction causing severe rash involving mucus membranes or skin necrosis: No Has patient had a PCN reaction that required hospitalization No Has patient had a PCN reaction occurring within the last 10 years: Yes If all of the above answers are "NO", then may proceed with Cephalosporin use.    Tylenol [Acetaminophen] Other (See Comments)    HX of Hep. C    Social History   Socioeconomic History   Marital status: Single    Spouse name: Not on file   Number of children: Not on file   Years of education: Not on file   Highest education level: Not on file  Occupational History   Occupation: disabled  Tobacco Use   Smoking status: Never   Smokeless tobacco: Never  Vaping Use   Vaping Use: Never used  Substance and Sexual Activity   Alcohol use: Not Currently    Comment: 2 glasses of wine every other day   Drug use: Yes    Frequency: 7.0 times per week    Types: Marijuana    Comment: daily for arthritis   Sexual activity: Not Currently    Partners: Male    Birth control/protection: None, Condom  Other Topics Concern   Not on file  Social History Narrative   Not on file   Social Determinants of Health   Financial Resource Strain: Not on file  Food Insecurity: Not on file  Transportation Needs: Not on file  Physical Activity: Not on file  Stress: Not on file  Social Connections: Not on file  Intimate Partner Violence: Not on file    Family History  Adopted: Yes  Problem Relation Age of Onset   Colon cancer Neg Hx    Breast cancer Neg Hx     Past Surgical History:  Procedure Laterality Date   BACK SURGERY     lumbar fusion   BIOPSY  09/12/2017   Procedure: BIOPSY;  Surgeon: Corbin Ade, MD;  [facility];  Service: Endoscopy;;  gastric   CARPAL TUNNEL RELEASE Right 11/13/2015   Procedure: RIGHT CARPAL TUNNEL  RELEASE;  Surgeon: [facility] Loa, MD;  Location: [facility];  Service: Orthopedics;  Laterality: Right;   CARPAL TUNNEL RELEASE Left 01/29/2016   Procedure: left CARPAL TUNNEL RELEASE;  Surgeon: Betha Loa, MD;  Location: [facility];  Service: Orthopedics;  Laterality: Left;   COLONOSCOPY N/A 07/08/2015   Surgeon: Corbin Ade, MD; internal hemorrhoids, colonic diverticulosis.  Repeat in 10 years.   ESOPHAGOGASTRODUODENOSCOPY N/A 07/08/2015   Surgeon: Corbin Ade, MD; erosive reflux esophagitis, hiatal hernia, gastric erosion and ulcer s/p biopsy (reactive gastropathy,  negative H. pylori), no esophageal varices or portal hypertension.   ESOPHAGOGASTRODUODENOSCOPY (EGD) WITH PROPOFOL N/A 09/12/2017   Surgeon: Corbin Ade, MD; normal esophagus s/p dilation, portal hypertensive gastropathy, erosive gastropathy biopsied (negative for H. pylori), normal examined duodenum.   ESOPHAGOGASTRODUODENOSCOPY (EGD) WITH PROPOFOL N/A 10/25/2019   Surgeon: Corbin Ade, MD;  normal esophagus s/p dilation, mild erosive reflux esophagitis, portal hypertensive gastropathy, due for repeat in 2023.   FINGER ARTHROSCOPY WITH CARPOMETACARPEL [facility]) ARTHROPLASTY Right 04/24/2020   Procedure: RIGHT INDEX FINGER METACARPAL PHALANGEAL ARTHROPLASTY;  Surgeon: Betha Loa, MD;  Location: [facility];  Service: Orthopedics;  Laterality: Right;   LIGAMENT REPAIR Right 1980   Knee   MALONEY DILATION N/A 09/12/2017   Procedure: Elease Hashimoto DILATION;  Surgeon: Corbin Ade, MD;  [facility];  Service: Endoscopy;  Laterality: N/A;   MALONEY DILATION N/A 10/25/2019   Procedure: Elease Hashimoto DILATION;  Surgeon: Corbin Ade, MD;  [facility];  Service: Endoscopy;  Laterality: N/A;   TRIGGER FINGER RELEASE Right 04/24/2020   Procedure: RIGHT LONG TRIGGER FINGER RELEASE TRIGGER FINGER/A-1 PULLEY;  Surgeon: [facility] Loa, MD;  Location: [facility];  Service: Orthopedics;  Laterality: Right;    ROS: Review of Systems Negative except as stated above  PHYSICAL EXAM: BP 140/80   Pulse 63   Temp 98.2 F (36.8 C) (Oral)   Resp 16   Wt 169 lb (76.7 kg)   LMP 09/29/2016 (Approximate)   SpO2 97%   BMI 31.93 kg/m   Wt Readings from Last 3 Encounters:  10/23/21 169 lb (76.7 kg)  09/15/21 175 lb 6.4 oz (79.6 kg)  03/30/21 165 lb 12.8 oz (75.2 kg)    Physical Exam  General appearance - alert, well appearing, and in no distress Mental status - normal mood, behavior, speech, dress, motor activity, and thought processes Chest - clear to auscultation, no wheezes, rales or rhonchi, symmetric air entry Heart - normal rate, regular rhythm, normal S1, S2, no murmurs, rubs, clicks or gallops Musculoskeletal -no tenderness on palpation of the lumbar spine or surrounding paraspinal muscles.  No tenderness on palpation of the buttock.  She has good range of motion of the right hip. Extremities - peripheral pulses normal, no pedal edema, no clubbing or cyanosis Neuro: Normal LEAP exam of the feet.  She has good range of motion of the toes.     Latest Ref Rng & Units 10/15/2020    9:25 AM 08/14/2019   11:05 AM 03/02/2018    9:04 AM  CMP  Glucose 65 - 99 mg/dL 86   92   91    BUN 6 - 24 mg/dL 15   10   10     Creatinine 0.57 - 1.00 mg/dL   1.82   9.93    Sodium 134 - 144 mmol/L 139   140   140    Potassium 3.5 - 5.2 mmol/L 4.4   4.4   3.8    Chloride 96 - 106 mmol/L 101   104   101    CO2 20 - 29 mmol/L 23   26   25     Calcium 8.7 - 10.2 mg/dL 9.6   9.5   9.3    Total Protein 6.0 - 8.5 g/dL 7.6   7.3   7.5    Total Bilirubin 0.0 - 1.2 mg/dL 0.4   0.6   0.6    Alkaline Phos 44 - 121 IU/L 88  66   57    AST 0 - 40 IU/L ALT 0 - 32 IU/L Lipid Panel     Component Value Date/Time   CHOL 216 (H) 10/15/2020 0925   TRIG 78 10/15/2020 0925   HDL 51 10/15/2020 0925   CHOLHDL 5.1 (H) 10/26/2019  1022   CHOLHDL 2.8 09/28/2013 0904   VLDL 25 09/28/2013 0904   LDLCALC 151 (H) 10/15/2020 0925    CBC    Component Value Date/Time   WBC 7.2 10/15/2020 0925   WBC 7.5 08/14/2019 1105   RBC 4.79 10/15/2020 0925   RBC 4.67 08/14/2019 1105   HGB 14.3 10/15/2020 0925   HCT 43.4 10/15/2020 0925   PLT 205 10/15/2020 0925   MCV 91 10/15/2020 0925   MCH 29.9 10/15/2020 0925   MCH 29.1 08/14/2019 1105   MCHC 32.9 10/15/2020 0925   MCHC 32.7 08/14/2019 1105   RDW 13.4 10/15/2020 0925   LYMPHSABS 2.6 10/15/2020 0925   MONOABS 0.5 03/02/2018 0904   EOSABS 0.2 10/15/2020 0925   BASOSABS 0.0 10/15/2020 0925    ASSESSMENT AND PLAN: 1. Essential hypertension Not at goal.  She will take amlodipine when she returns home.  Continue current medication.  2. Pain in toes of both feet I recommend getting x-rays of the feet to see whether she has arthritis in the toes.  Patient declines.  She feels it is coming from her back.  3. Other chronic back pain Patient declines getting an x-ray done at this time.  What she really wants is the letter to be excused from jury duty.  I have given a letter.  She will continue follow-up with her pain specialist.    Patient was given the opportunity to ask questions.  Patient verbalized understanding of the plan and was able to repeat key elements of the plan.   This documentation was completed using Paediatric nurse.  Any transcriptional errors are unintentional.  No orders of the defined types were placed in this encounter.    Requested Prescriptions    No prescriptions requested or ordered in this encounter    Return in about 4 months (around 02/22/2022) for Appt with [facility] in 2 wks for BP check.  Jonah Blue, MD, FACP

## 2021-10-23 NOTE — Progress Notes (Signed)
Pain in right hip and toes 10/10  Constant  Letter to be dismissed from jury duty Handicap placard

## 2021-10-26 ENCOUNTER — Telehealth: Payer: Self-pay | Admitting: Internal Medicine

## 2021-10-26 NOTE — Telephone Encounter (Signed)
Pt dropped off placecard paperwork for pcp to fill out.

## 2021-10-30 NOTE — Telephone Encounter (Signed)
Pt was called and VM was left informing her that paperwork is ready for pick up.

## 2021-10-31 ENCOUNTER — Other Ambulatory Visit: Payer: Self-pay | Admitting: Internal Medicine

## 2021-10-31 DIAGNOSIS — M19041 Primary osteoarthritis, right hand: Secondary | ICD-10-CM

## 2021-11-02 NOTE — Telephone Encounter (Signed)
Requested medication (s) are due for refill today: yes  Requested medication (s) are on the active medication list: yes  Last refill:  07/24/21  Future visit scheduled: no  Notes to clinic:  Unable to refill per protocol due to failed labs, no updated results.      Requested Prescriptions  Pending Prescriptions Disp Refills   celecoxib (CELEBREX) 200 MG capsule [Pharmacy Med Name: Celecoxib 200 MG Oral Capsule] 90 capsule 0    Sig: Take 1 capsule by mouth once daily     Analgesics:  COX2 Inhibitors Failed - 10/31/2021 11:39 AM      Failed - Manual Review: Labs are only required if the patient has taken medication for more than 8 weeks.      Failed - HGB in normal range and within 360 days    Hemoglobin  Date Value Ref Range Status  10/15/2020 14.3 11.1 - 15.9 g/dL Final         Failed - Cr in normal range and within 360 days    Creat  Date Value Ref Range Status  06/15/2016 0.76 0.50 - 1.05 mg/dL Final    Comment:      For patients > or = 60 years of age: The upper reference limit for Creatinine is approximately 13% higher for people identified as African-American.      Creatinine, Ser  Date Value Ref Range Status  10/15/2020 0.70 0.57 - 1.00 mg/dL Final         Failed - HCT in normal range and within 360 days    Hematocrit  Date Value Ref Range Status  10/15/2020 43.4 34.0 - 46.6 % Final         Failed - AST in normal range and within 360 days    AST  Date Value Ref Range Status  10/15/2020 18 0 - 40 IU/L Final         Failed - ALT in normal range and within 360 days    ALT  Date Value Ref Range Status  10/15/2020 18 0 - 32 IU/L Final         Failed - eGFR is 30 or above and within 360 days    GFR, Est African American  Date Value Ref Range Status  06/15/2016 >89 >=60 mL/min Final   GFR calc Af Amer  Date Value Ref Range Status  08/14/2019 >60 >60 mL/min Final   GFR, Est Non African American  Date Value Ref Range Status  06/15/2016 89 >=60  mL/min Final   GFR calc non Af Amer  Date Value Ref Range Status  08/14/2019 >60 >60 mL/min Final   eGFR  Date Value Ref Range Status  10/15/2020 100 >59 mL/min/1.73 Final         Passed - Patient is not pregnant      Passed - Valid encounter within last 12 months    Recent Outpatient Visits           1 week ago Essential hypertension   [place] Ladell Pier, MD   7 months ago Essential hypertension   [place], Deborah B, MD   12 months ago Essential hypertension   [place] Ladell Pier, MD   1 year ago Patient left before evaluation by physician   [facility], MD   1 year ago Need for influenza vaccination   [place]  Girard, RPH-CPP

## 2021-11-03 ENCOUNTER — Telehealth: Payer: Self-pay

## 2021-11-03 NOTE — Telephone Encounter (Signed)
Pt called and lmom inquiring about labs that was sent to the office for review. According to [facility] only one of the four labs that [place] requested was completed. Tried to contact pt and was unable to reach. Lmom for pt to return my call.

## 2021-11-05 NOTE — Addendum Note (Signed)
Addended by: Zada Finders on: 11/05/2021 12:26 PM   Modules accepted: Orders

## 2021-11-05 NOTE — Telephone Encounter (Signed)
Pt was made aware and stated that she will have labs completed as soon as possible.

## 2021-11-11 DIAGNOSIS — R1011 Right upper quadrant pain: Secondary | ICD-10-CM | POA: Diagnosis not present

## 2021-11-11 DIAGNOSIS — Z8619 Personal history of other infectious and parasitic diseases: Secondary | ICD-10-CM | POA: Diagnosis not present

## 2021-11-11 DIAGNOSIS — K219 Gastro-esophageal reflux disease without esophagitis: Secondary | ICD-10-CM | POA: Diagnosis not present

## 2021-11-11 DIAGNOSIS — K7460 Unspecified cirrhosis of liver: Secondary | ICD-10-CM | POA: Diagnosis not present

## 2021-11-12 LAB — CBC WITH DIFFERENTIAL/PLATELET
Basophils Absolute: 0.1 10*3/uL (ref 0.0–0.2)
Basos: 1 %
EOS (ABSOLUTE): 0.2 10*3/uL (ref 0.0–0.4)
Eos: 3 %
Hematocrit: 41.1 % (ref 34.0–46.6)
Hemoglobin: 13.8 g/dL (ref 11.1–15.9)
Immature Grans (Abs): 0 10*3/uL (ref 0.0–0.1)
Immature Granulocytes: 0 %
Lymphocytes Absolute: 3.1 10*3/uL (ref 0.7–3.1)
Lymphs: 37 %
MCH: 29.4 pg (ref 26.6–33.0)
MCHC: 33.6 g/dL (ref 31.5–35.7)
MCV: 88 fL (ref 79–97)
Monocytes Absolute: 0.8 10*3/uL (ref 0.1–0.9)
Monocytes: 10 %
Neutrophils Absolute: 4.1 10*3/uL (ref 1.4–7.0)
Neutrophils: 49 %
Platelets: 203 10*3/uL (ref 150–450)
RBC: 4.69 x10E6/uL (ref 3.77–5.28)
RDW: 13.5 % (ref 11.7–15.4)
WBC: 8.3 10*3/uL (ref 3.4–10.8)

## 2021-11-12 LAB — COMPREHENSIVE METABOLIC PANEL
ALT: 15 IU/L (ref 0–32)
AST: 17 IU/L (ref 0–40)
Albumin/Globulin Ratio: 1.7 (ref 1.2–2.2)
Albumin: 4.6 g/dL (ref 3.8–4.9)
Alkaline Phosphatase: 77 IU/L (ref 44–121)
BUN/Creatinine Ratio: 18 (ref 12–28)
BUN: 13 mg/dL (ref 8–27)
Bilirubin Total: 0.3 mg/dL (ref 0.0–1.2)
CO2: 26 mmol/L (ref 20–29)
Calcium: 9.5 mg/dL (ref 8.7–10.3)
Chloride: 103 mmol/L (ref 96–106)
Creatinine, Ser: 0.71 mg/dL (ref 0.57–1.00)
Globulin, Total: 2.7 g/dL (ref 1.5–4.5)
Glucose: 96 mg/dL (ref 70–99)
Potassium: 4.2 mmol/L (ref 3.5–5.2)
Sodium: 142 mmol/L (ref 134–144)
Total Protein: 7.3 g/dL (ref 6.0–8.5)
eGFR: 97 mL/min/{1.73_m2} (ref >59)

## 2021-11-12 LAB — PROTIME-INR
INR: 1 (ref 0.9–1.2)
Prothrombin Time: 10.6 s (ref 9.1–12.0)

## 2021-11-17 ENCOUNTER — Telehealth: Payer: Self-pay | Admitting: Pharmacist

## 2021-11-18 DIAGNOSIS — Z79899 Other long term (current) drug therapy: Secondary | ICD-10-CM | POA: Diagnosis not present

## 2021-11-23 DIAGNOSIS — Z79899 Other long term (current) drug therapy: Secondary | ICD-10-CM | POA: Diagnosis not present

## 2021-11-26 ENCOUNTER — Other Ambulatory Visit: Payer: Self-pay | Admitting: Internal Medicine

## 2021-11-26 DIAGNOSIS — I10 Essential (primary) hypertension: Secondary | ICD-10-CM

## 2021-11-26 NOTE — Telephone Encounter (Signed)
Requested Prescriptions  Pending Prescriptions Disp Refills  . colchicine 0.6 MG tablet [Pharmacy Med Name: Colchicine 0.6 MG Oral Tablet] 180 tablet 3    Sig: TAKE 1 TABLET BY MOUTH TWICE DAILY . APPOINTMENT REQUIRED FOR FUTURE REFILLS     Endocrinology:  Gout Agents - colchicine Passed - 11/26/2021 10:06 AM      Passed - Cr in normal range and within 360 days    Creat  Date Value Ref Range Status  06/15/2016 0.76 0.50 - 1.05 mg/dL Final    Comment:      For patients > or = 60 years of age: The upper reference limit for Creatinine is approximately 13% higher for people identified as African-American.      Creatinine, Ser  Date Value Ref Range Status  11/11/2021 0.71 0.57 - 1.00 mg/dL Final         Passed - ALT in normal range and within 360 days    ALT  Date Value Ref Range Status  11/11/2021 15 0 - 32 IU/L Final         Passed - AST in normal range and within 360 days    AST  Date Value Ref Range Status  11/11/2021 17 0 - 40 IU/L Final         Passed - Valid encounter within last 12 months    Recent Outpatient Visits          1 month ago Essential hypertension   [facility] [place], Binnie Rail, MD   8 months ago Essential hypertension   [facility] Marcine Matar, MD   1 year ago Essential hypertension   [facility] Marcine Matar, MD   1 year ago Patient left before evaluation by physician   [facility] Marcine Matar, MD   1 year ago Need for influenza vaccination   [facility] [place], Cornelius Moras, RPH-CPP             Passed - CBC within normal limits and completed in the last 12 months    WBC  Date Value Ref Range Status  11/11/2021 8.3 3.4 - 10.8 x10E3/uL Final  08/14/2019 7.5 4.0 - 10.5 K/uL Final   RBC  Date Value Ref Range Status  11/11/2021 4.69 3.77 - 5.28 x10E6/uL Final  08/14/2019  4.67 3.87 - 5.11 MIL/uL Final   Hemoglobin  Date Value Ref Range Status  11/11/2021 13.8 11.1 - 15.9 g/dL Final   Hematocrit  Date Value Ref Range Status  11/11/2021 41.1 34.0 - 46.6 % Final   MCHC  Date Value Ref Range Status  11/11/2021 33.6 31.5 - 35.7 g/dL Final  12/45/8099 83.3 30.0 - 36.0 g/dL Final   [facility]  Date Value Ref Range Status  11/11/2021 29.4 26.6 - 33.0 pg Final  08/14/2019 29.1 26.0 - 34.0 pg Final   MCV  Date Value Ref Range Status  11/11/2021 88 79 - 97 fL Final   No results found for: "PLTCOUNTKUC", "LABPLAT", "POCPLA" RDW  Date Value Ref Range Status  11/11/2021 13.5 11.7 - 15.4 % Final         . amLODipine (NORVASC) 10 MG tablet [Pharmacy Med Name: amLODIPine Besylate 10 MG Oral Tablet] 90 tablet 1    Sig: Take 1 tablet by mouth once daily     Cardiovascular: Calcium Channel Blockers 2 Failed - 11/26/2021 10:06 AM  Failed - Last BP in normal range    BP Readings from Last 1 Encounters:  10/23/21 140/80         Passed - Last Heart Rate in normal range    Pulse Readings from Last 1 Encounters:  10/23/21 63         Passed - Valid encounter within last 6 months    Recent Outpatient Visits          1 month ago Essential hypertension   [facility] Marcine Matar, MD   8 months ago Essential hypertension   [facility] Marcine Matar, MD   1 year ago Essential hypertension   [facility] Marcine Matar, MD   1 year ago Patient left before evaluation by physician   [facility] Marcine Matar, MD   1 year ago Need for influenza vaccination   [facility] [place], Cornelius Moras, RPH-CPP

## 2021-12-10 ENCOUNTER — Other Ambulatory Visit: Payer: Self-pay | Admitting: Internal Medicine

## 2021-12-10 DIAGNOSIS — Z1231 Encounter for screening mammogram for malignant neoplasm of breast: Secondary | ICD-10-CM

## 2021-12-21 ENCOUNTER — Ambulatory Visit: Payer: Medicaid Other

## 2021-12-21 DIAGNOSIS — Z79899 Other long term (current) drug therapy: Secondary | ICD-10-CM | POA: Diagnosis not present

## 2021-12-22 ENCOUNTER — Other Ambulatory Visit: Payer: Self-pay | Admitting: Internal Medicine

## 2021-12-22 DIAGNOSIS — M47816 Spondylosis without myelopathy or radiculopathy, lumbar region: Secondary | ICD-10-CM

## 2021-12-22 DIAGNOSIS — G5603 Carpal tunnel syndrome, bilateral upper limbs: Secondary | ICD-10-CM

## 2021-12-23 DIAGNOSIS — Z79899 Other long term (current) drug therapy: Secondary | ICD-10-CM | POA: Diagnosis not present

## 2022-01-04 ENCOUNTER — Ambulatory Visit: Payer: Medicaid Other

## 2022-01-06 ENCOUNTER — Ambulatory Visit
Admission: RE | Admit: 2022-01-06 | Discharge: 2022-01-06 | Disposition: A | Discharge status: Home / Self Care | Payer: Medicaid Other | Source: Ambulatory Visit | Attending: Internal Medicine | Admitting: Internal Medicine

## 2022-01-06 DIAGNOSIS — Z1231 Encounter for screening mammogram for malignant neoplasm of breast: Secondary | ICD-10-CM

## 2022-01-22 ENCOUNTER — Ambulatory Visit
Admission: RE | Admit: 2022-01-22 | Discharge: 2022-01-22 | Disposition: A | Discharge status: Home / Self Care | Payer: Medicaid Other | Source: Ambulatory Visit | Attending: Internal Medicine | Admitting: Internal Medicine

## 2022-01-22 DIAGNOSIS — Z1231 Encounter for screening mammogram for malignant neoplasm of breast: Secondary | ICD-10-CM | POA: Diagnosis not present

## 2022-01-26 ENCOUNTER — Telehealth: Payer: Self-pay

## 2022-01-26 NOTE — Telephone Encounter (Signed)
Contacted pt to go over MM results. Left a detailed vm making pt aware of results and if she has any questions or concerns to give [name] a call

## 2022-02-09 DIAGNOSIS — Z79899 Other long term (current) drug therapy: Secondary | ICD-10-CM | POA: Diagnosis not present

## 2022-02-11 DIAGNOSIS — Z79899 Other long term (current) drug therapy: Secondary | ICD-10-CM | POA: Diagnosis not present

## 2022-03-09 DIAGNOSIS — Z79899 Other long term (current) drug therapy: Secondary | ICD-10-CM | POA: Diagnosis not present

## 2022-03-11 DIAGNOSIS — Z79899 Other long term (current) drug therapy: Secondary | ICD-10-CM | POA: Diagnosis not present

## 2022-03-17 ENCOUNTER — Encounter: Payer: Self-pay | Admitting: Gastroenterology

## 2022-03-17 ENCOUNTER — Ambulatory Visit (INDEPENDENT_AMBULATORY_CARE_PROVIDER_SITE_OTHER): Payer: Medicaid Other | Admitting: Gastroenterology

## 2022-03-17 ENCOUNTER — Encounter: Payer: Self-pay | Admitting: *Deleted

## 2022-03-17 VITALS — BP 118/73 | HR 62 | Temp 98.4°F | Ht 61.0 in | Wt 165.6 lb

## 2022-03-17 DIAGNOSIS — K219 Gastro-esophageal reflux disease without esophagitis: Secondary | ICD-10-CM

## 2022-03-17 DIAGNOSIS — K7460 Unspecified cirrhosis of liver: Secondary | ICD-10-CM

## 2022-03-17 NOTE — Progress Notes (Signed)
Hb controlled. Educated about etoh.

## 2022-03-17 NOTE — Patient Instructions (Signed)
Continue pantoprazole once to twice daily for acid reflux. Recommend avoiding all alcohol given your history of cirrhosis of the liver.  Upper endoscopy to be schedule to look at your stomach and esophagus to make sure you have not developed any varices which can occur with cirrhosis.  Please have your labs and ultrasound done. We will be in touch with results as available.  Return office visit in six months.

## 2022-03-17 NOTE — Progress Notes (Signed)
GI Office Note    Referring Provider: Marcine Matar, MD Primary Care Physician:  Marcine Matar, MD  Primary Gastroenterologist: Roetta Sessions, MD   Chief Complaint   Chief Complaint  Patient presents with   Follow-up    Doing well, no issues.    History of Present Illness   Rachel Hudson is a 61 y.o. female presenting today for follow-up of hepatitis C related cirrhosis.  Last seen in April.  Hepatitis C was successfully eradicated.  She has no complaints today. Her reflux is well controlled. No dysphagia, constipation, diarrhea, melena, brbpr, abdominal pain, n/v, unintentional weight loss. She has cut back on etoh use. She used to have 1-2 glasses of wine each night but now down to 2 ounces. She states she goes to parties on occasion and will drink more.   Labs from June 2023: MELD 6, normal LFTs, normal CBC.  AFP tumor marker was not completed.  Abdominal ultrasound April 2023 showed stable cirrhosis, no hepatoma.  EGD June 2021: -Normal esophagus. Dilated. Mild erosive reflux esophagitis - Portal hypertensive gastropathy. - The examination was otherwise normal. - Normal duodenal bulb and second portion of the duodenum. - No specimens collected. -recommended EGD for surveillance in 2 years.   Colonoscopy February 2017: -Internal hemorrhoids -Colonic diverticulosis -Next colonoscopy 2027.  Medications   Current Outpatient Medications  Medication Sig Dispense Refill   amLODipine (NORVASC) 10 MG tablet Take 1 tablet by mouth once daily 90 tablet 1   celecoxib (CELEBREX) 200 MG capsule Take 1 capsule by mouth once daily 90 capsule 1   colchicine 0.6 MG tablet TAKE 1 TABLET BY MOUTH TWICE DAILY . APPOINTMENT REQUIRED FOR FUTURE REFILLS 180 tablet 3   COLLAGEN PO Take 3 g by mouth daily.     ergocalciferol (VITAMIN D2) 1.25 MG (50000 UT) capsule Take 1 capsule by mouth once a week.     Flax Oil-Fish Oil-Borage Oil (FISH-FLAX-BORAGE PO) Take 1 capsule  by mouth daily.     gabapentin (NEURONTIN) 800 MG tablet TAKE 1 TABLET BY MOUTH EVERY 6 HOURS 120 tablet 3   Lecith-Inosi-Chol-B12-Liver (LIVERITE) 1.125 MCG TABS Take 1 tablet by mouth 2 (two) times daily. LIVER AID     milk thistle 175 MG tablet Take 175 mg by mouth daily.     Multiple Vitamin (MULTIVITAMIN WITH MINERALS) TABS tablet Take 1 tablet by mouth daily.     OVER THE COUNTER MEDICATION salompad     Oxycodone HCl 10 MG TABS Take 10 mg by mouth 5 (five) times daily.     pantoprazole (PROTONIX) 40 MG tablet TAKE 1 TABLET BY MOUTH TWICE DAILY BEFORE A MEAL 60 tablet 5   No current facility-administered medications for this visit.    Allergies   Allergies as of 03/17/2022 - Review Complete 03/17/2022  Allergen Reaction Noted   Penicillins  03/28/2011   Tylenol [acetaminophen] Other (See Comments) 07/14/2014     Past Medical History   Past Medical History:  Diagnosis Date   ADD (attention deficit disorder)    ADHD    Anxiety    Arthritis    Arthritis    Back pain    Bipolar 1 disorder (HCC)    Cirrhosis of liver (HCC) 01/2014   Stage 4   Foot pain, bilateral    GERD (gastroesophageal reflux disease)    Gout    Hand pain    Hep C w/o coma, chronic (HCC) As of 10/22/13   S/p  treatment with Harvoni, achieved SVR   Hyperlipidemia    Hypertension    Joint pain    Liver problem    Osteoarthritis    Other fatigue    Shortness of breath    Shortness of breath on exertion    Vitamin D deficiency     Past Surgical History   Past Surgical History:  Procedure Laterality Date   BACK SURGERY     lumbar fusion   BIOPSY  09/12/2017   Procedure: BIOPSY;  Surgeon: Corbin Ade, MD;  [facility];  Service: Endoscopy;;  gastric   CARPAL TUNNEL RELEASE Right 11/13/2015   Procedure: RIGHT CARPAL TUNNEL RELEASE;  Surgeon: Betha Loa, MD;  Location: [facility];  Service: Orthopedics;  Laterality: Right;   CARPAL TUNNEL RELEASE Left 01/29/2016    Procedure: left CARPAL TUNNEL RELEASE;  Surgeon: Betha Loa, MD;  Location: [facility];  Service: Orthopedics;  Laterality: Left;   COLONOSCOPY N/A 07/08/2015   Surgeon: Corbin Ade, MD; internal hemorrhoids, colonic diverticulosis.  Repeat in 10 years.   ESOPHAGOGASTRODUODENOSCOPY N/A 07/08/2015   Surgeon: Corbin Ade, MD; erosive reflux esophagitis, hiatal hernia, gastric erosion and ulcer s/p biopsy (reactive gastropathy, negative H. pylori), no esophageal varices or portal hypertension.   ESOPHAGOGASTRODUODENOSCOPY (EGD) WITH PROPOFOL N/A 09/12/2017   Surgeon: Corbin Ade, MD; normal esophagus s/p dilation, portal hypertensive gastropathy, erosive gastropathy biopsied (negative for H. pylori), normal examined duodenum.   ESOPHAGOGASTRODUODENOSCOPY (EGD) WITH PROPOFOL N/A 10/25/2019   Surgeon: Corbin Ade, MD;  normal esophagus s/p dilation, mild erosive reflux esophagitis, portal hypertensive gastropathy, due for repeat in 2023.   FINGER ARTHROSCOPY WITH CARPOMETACARPEL [facility]) ARTHROPLASTY Right 04/24/2020   Procedure: RIGHT INDEX FINGER METACARPAL PHALANGEAL ARTHROPLASTY;  Surgeon: Betha Loa, MD;  Location: [facility];  Service: Orthopedics;  Laterality: Right;   LIGAMENT REPAIR Right 1980   Knee   MALONEY DILATION N/A 09/12/2017   Procedure: Elease Hashimoto DILATION;  Surgeon: Corbin Ade, MD;  [facility];  Service: Endoscopy;  Laterality: N/A;   MALONEY DILATION N/A 10/25/2019   Procedure: Elease Hashimoto DILATION;  Surgeon: Corbin Ade, MD;  [facility];  Service: Endoscopy;  Laterality: N/A;   TRIGGER FINGER RELEASE Right 04/24/2020   Procedure: RIGHT LONG TRIGGER FINGER RELEASE TRIGGER FINGER/A-1 PULLEY;  Surgeon: Betha Loa, MD;  Location: [facility];  Service: Orthopedics;  Laterality: Right;    Past Family History   Family History  Adopted: Yes  Problem Relation Age of Onset   Colon cancer Neg Hx     Breast cancer Neg Hx     Past Social History   Social History   Socioeconomic History   Marital status: Single    Spouse name: Not on file   Number of children: Not on file   Years of education: Not on file   Highest education level: Not on file  Occupational History   Occupation: disabled  Tobacco Use   Smoking status: Never   Smokeless tobacco: Never  Vaping Use   Vaping Use: Never used  Substance and Sexual Activity   Alcohol use: Not Currently    Comment: 2 glasses of wine every other day   Drug use: Yes    Frequency: 7.0 times per week    Types: Marijuana    Comment: daily for arthritis   Sexual activity: Not Currently    Partners: Male    Birth control/protection: None, Condom  Other Topics Concern   Not on file  Social History Narrative   Not on file   Social Determinants of Health   Financial Resource Strain: Not on file  Food Insecurity: Not on file  Transportation Needs: Not on file  Physical Activity: Not on file  Stress: Not on file  Social Connections: Not on file  Intimate Partner Violence: Not on file    Review of Systems   General: Negative for anorexia, weight loss, fever, chills, fatigue, weakness. ENT: Negative for hoarseness, difficulty swallowing , nasal congestion. CV: Negative for chest pain, angina, palpitations, dyspnea on exertion, peripheral edema.  Respiratory: Negative for dyspnea at rest, dyspnea on exertion, cough, sputum, wheezing.  GI: See history of present illness. GU:  Negative for dysuria, hematuria, urinary incontinence, urinary frequency, nocturnal urination.  Endo: Negative for unusual weight change.     Physical Exam   BP 118/73 (BP Location: Right Arm, Patient Position: Sitting, Cuff Size: Normal)   Pulse 62   Temp 98.4 F (36.9 C) (Oral)   Ht 5\' 1"  (1.549 m)   Wt 165 lb 9.6 oz (75.1 kg)   LMP 09/29/2016 (Approximate)   SpO2 96%   BMI 31.29 kg/m    General: Well-nourished, well-developed in no acute  distress.  Eyes: No icterus. Mouth: Oropharyngeal mucosa moist and pink , no lesions erythema or exudate. Lungs: Clear to auscultation bilaterally.  Heart: Regular rate and rhythm, no murmurs rubs or gallops.  Abdomen: Bowel sounds are normal, nontender, nondistended, no hepatosplenomegaly or masses,  no abdominal bruits or hernia , no rebound or guarding.  Rectal: not performed Extremities: No lower extremity edema. No clubbing or deformities. Neuro: Alert and oriented x 4   Skin: Warm and dry, no jaundice.   Psych: Alert and cooperative, normal mood and affect.  Labs   See hpi Imaging Studies   No results found.  Assessment   GERD: doing well on PPI BID. Encouraged her to try to get by on one daily. May take second one if needed.   Cirrhosis: s/p Hep C eradication. Due for EGD, ultrasound, labs at this time. Discussed need to cut out all etoh due to her cirrhosis. Patient has been cutting back but still drinks several drinks when at a party. Discussed that her liver disease can progress in the setting of ongoing etoh use and that ongoing etoh use could prevent her from being transplant candidate if needed in the future.    PLAN   EGD with Dr. Gala Romney. ASA 3.  I have discussed the risks, alternatives, benefits with regards to but not limited to the risk of reaction to medication, bleeding, infection, perforation and the patient is agreeable to proceed. Written consent to be obtained. Pantoprazole 40mg  once to twice daily. Update labs and RUQ U/S at [facility].  Encouraged no etoh use.  Return ov in six months.    Laureen Ochs. Bobby Rumpf, [place], [facility]

## 2022-03-19 ENCOUNTER — Telehealth: Payer: Self-pay | Admitting: *Deleted

## 2022-03-19 ENCOUNTER — Encounter: Payer: Self-pay | Admitting: *Deleted

## 2022-03-19 NOTE — Telephone Encounter (Signed)
[place] [employer]: approved authorization #[id number]   DOS: 04/14/22-07/13/22

## 2022-03-22 ENCOUNTER — Ambulatory Visit (HOSPITAL_COMMUNITY)
Admission: RE | Admit: 2022-03-22 | Discharge: 2022-03-22 | Disposition: A | Discharge status: Home / Self Care | Payer: Medicaid Other | Source: Ambulatory Visit | Attending: Gastroenterology | Admitting: Gastroenterology

## 2022-03-22 DIAGNOSIS — K7460 Unspecified cirrhosis of liver: Secondary | ICD-10-CM | POA: Insufficient documentation

## 2022-03-23 DIAGNOSIS — Z79899 Other long term (current) drug therapy: Secondary | ICD-10-CM | POA: Diagnosis not present

## 2022-03-23 LAB — CBC WITH DIFFERENTIAL/PLATELET
Basophils Absolute: 0.1 10*3/uL (ref 0.0–0.2)
Basos: 1 %
EOS (ABSOLUTE): 0.2 10*3/uL (ref 0.0–0.4)
Eos: 3 %
Hematocrit: 42.1 % (ref 34.0–46.6)
Hemoglobin: 13.9 g/dL (ref 11.1–15.9)
Immature Grans (Abs): 0 10*3/uL (ref 0.0–0.1)
Immature Granulocytes: 0 %
Lymphocytes Absolute: 3.5 10*3/uL — ABNORMAL HIGH (ref 0.7–3.1)
Lymphs: 39 %
MCH: 29.1 pg (ref 26.6–33.0)
MCHC: 33 g/dL (ref 31.5–35.7)
MCV: 88 fL (ref 79–97)
Monocytes Absolute: 0.7 10*3/uL (ref 0.1–0.9)
Monocytes: 7 %
Neutrophils Absolute: 4.5 10*3/uL (ref 1.4–7.0)
Neutrophils: 50 %
Platelets: 200 10*3/uL (ref 150–450)
RBC: 4.77 x10E6/uL (ref 3.77–5.28)
RDW: 13.7 % (ref 11.7–15.4)
WBC: 9 10*3/uL (ref 3.4–10.8)

## 2022-03-23 LAB — COMPREHENSIVE METABOLIC PANEL
ALT: 17 IU/L (ref 0–32)
AST: 18 IU/L (ref 0–40)
Albumin/Globulin Ratio: 1.9 (ref 1.2–2.2)
Albumin: 4.7 g/dL (ref 3.8–4.9)
Alkaline Phosphatase: 71 IU/L (ref 44–121)
BUN/Creatinine Ratio: 15 (ref 12–28)
BUN: 12 mg/dL (ref 8–27)
Bilirubin Total: 0.3 mg/dL (ref 0.0–1.2)
CO2: 25 mmol/L (ref 20–29)
Calcium: 9.5 mg/dL (ref 8.7–10.3)
Chloride: 102 mmol/L (ref 96–106)
Creatinine, Ser: 0.8 mg/dL (ref 0.57–1.00)
Globulin, Total: 2.5 g/dL (ref 1.5–4.5)
Glucose: 95 mg/dL (ref 70–99)
Potassium: 4.6 mmol/L (ref 3.5–5.2)
Sodium: 140 mmol/L (ref 134–144)
Total Protein: 7.2 g/dL (ref 6.0–8.5)
eGFR: 84 mL/min/{1.73_m2} (ref >59)

## 2022-03-23 LAB — PROTIME-INR
INR: 1 (ref 0.9–1.2)
Prothrombin Time: 11.3 s (ref 9.1–12.0)

## 2022-03-23 LAB — AFP TUMOR MARKER: AFP, Serum, Tumor Marker: 3 ng/mL (ref 0.0–9.2)

## 2022-03-25 DIAGNOSIS — Z79899 Other long term (current) drug therapy: Secondary | ICD-10-CM | POA: Diagnosis not present

## 2022-04-06 DIAGNOSIS — Z79899 Other long term (current) drug therapy: Secondary | ICD-10-CM | POA: Diagnosis not present

## 2022-04-07 NOTE — Patient Instructions (Signed)
Rachel Hudson  04/07/2022     @[id number]$ @   Your procedure is scheduled on 04/14/22.  [name] at [facility].  Call this number if you have problems the morning of surgery:  [phone]  If you experience any cold or flu symptoms such as cough, fever, chills, shortness of breath, etc. between now and your scheduled surgery, please notify us at the above number.   Remember:  Do not eat or drink after midnight.     Take these medicines the morning of surgery with A SIP OF WATER norvasc, colchicine, gabapentin, oxyocodone (if needed) & protonix.    Do not wear jewelry, make-up or nail polish.  Do not wear lotions, powders, or perfumes, or deodorant.  Do not shave 48 hours prior to surgery.  Men may shave face and neck.  Do not bring valuables to the hospital.  [name] is not responsible for any belongings or valuables.  Contacts, dentures or bridgework may not be worn into surgery.  Leave your suitcase in the car.  After surgery it may be brought to your room.  For patients admitted to the hospital, discharge time will be determined by your treatment team.  Patients discharged the day of surgery will not be allowed to drive home.   Name and phone number of your driver:   family Special instructions:  follow diet provided to you by doctor's office  Please read over the following fact sheets that you were given. Anesthesia Post-op Instructions and Care and Recovery After Surgery      PATIENT INSTRUCTIONS POST-ANESTHESIA  IMMEDIATELY FOLLOWING SURGERY:  Do not drive or operate machinery for the first twenty four hours after surgery.  Do not make any important decisions for twenty four hours after surgery or while taking narcotic pain medications or sedatives.  If you develop intractable nausea and vomiting or a severe headache please notify your doctor immediately.  FOLLOW-UP:  Please make an appointment with your surgeon as instructed. You do not  need to follow up with anesthesia unless specifically instructed to do so.  WOUND CARE INSTRUCTIONS (if applicable):  Keep a dry clean dressing on the anesthesia/puncture wound site if there is drainage.  Once the wound has quit draining you may leave it open to air.  Generally you should leave the bandage intact for twenty four hours unless there is drainage.  If the epidural site drains for more than 36-48 hours please call the anesthesia department.  QUESTIONS?:  Please feel free to call your physician or the hospital operator if you have any questions, and they will be happy to assist you.      Monitored Anesthesia Care, Care After The following information offers guidance on how to care for yourself after your procedure. Your health care provider may also give you more specific instructions. If you have problems or questions, contact your health care provider. What can I expect after the procedure? After the procedure, it is common to have: Tiredness. Little or no memory about what happened during or after the procedure. Impaired judgment when it comes to making decisions. Nausea or vomiting. Some trouble with balance. Follow these instructions at home: For the time period you were told by your health care provider:  Rest. Do not participate in activities where you could fall or become injured. Do not drive or use machinery. Do not drink alcohol. Do not take sleeping pills or medicines that cause drowsiness. Do not make important decisions or sign  legal documents. Do not take care of children on your own. Medicines Take over-the-counter and prescription medicines only as told by your health care provider. If you were prescribed antibiotics, take them as told by your health care provider. Do not stop using the antibiotic even if you start to feel better. Eating and drinking Follow instructions from your health care provider about what you may eat and drink. Drink enough fluid to  keep your urine pale yellow. If you vomit: Drink clear fluids slowly and in small amounts as you are able. Clear fluids include water, ice chips, low-calorie sports drinks, and fruit juice that has water added to it (diluted fruit juice). Eat light and bland foods in small amounts as you are able. These foods include bananas, applesauce, rice, lean meats, toast, and crackers. General instructions  Have a responsible adult stay with you for the time you are told. It is important to have someone help care for you until you are awake and alert. If you have sleep apnea, surgery and some medicines can increase your risk for breathing problems. Follow instructions from your health care provider about wearing your sleep device: When you are sleeping. This includes during daytime naps. While taking prescription pain medicines, sleeping medicines, or medicines that make you drowsy. Do not use any products that contain nicotine or tobacco. These products include cigarettes, chewing tobacco, and vaping devices, such as e-cigarettes. If you need help quitting, ask your health care provider. Contact a health care provider if: You feel nauseous or vomit every time you eat or drink. You feel light-headed. You are still sleepy or having trouble with balance after 24 hours. You get a rash. You have a fever. You have redness or swelling around the IV site. Get help right away if: You have trouble breathing. You have new confusion after you get home. These symptoms may be an emergency. Get help right away. Call 911. Do not wait to see if the symptoms will go away. Do not drive yourself to the hospital. This information is not intended to replace advice given to you by your health care provider. Make sure you discuss any questions you have with your health care provider. Document Revised: 10/05/2021 Document Reviewed: 10/05/2021 Elsevier Patient Education  2023 [employer]. Upper Endoscopy, Adult, Care  After After the procedure, it is common to have a sore throat. It is also common to have: Mild stomach pain or discomfort. Bloating. Nausea. Follow these instructions at home: The instructions below may help you care for yourself at home. Your health care provider may give you more instructions. If you have questions, ask your health care provider. If you were given a sedative during the procedure, it can affect you for several hours. Do not drive or operate machinery until your health care provider says that it is safe. If you will be going home right after the procedure, plan to have a responsible adult: Take you home from the hospital or clinic. You will not be allowed to drive. Care for you for the time you are told. Follow instructions from your health care provider about what you may eat and drink. Return to your normal activities as told by your health care provider. Ask your health care provider what activities are safe for you. Take over-the-counter and prescription medicines only as told by your health care provider. Contact a health care provider if you: Have a sore throat that lasts longer than one day. Have trouble swallowing. Have a fever. Get  help right away if you: Vomit blood or your vomit looks like coffee grounds. Have bloody, black, or tarry stools. Have a very bad sore throat or you cannot swallow. Have difficulty breathing or very bad pain in your chest or abdomen. These symptoms may be an emergency. Get help right away. Call 911. Do not wait to see if the symptoms will go away. Do not drive yourself to the hospital. Summary After the procedure, it is common to have a sore throat, mild stomach discomfort, bloating, and nausea. If you were given a sedative during the procedure, it can affect you for several hours. Do not drive until your health care provider says that it is safe. Follow instructions from your health care provider about what you may eat and  drink. Return to your normal activities as told by your health care provider. This information is not intended to replace advice given to you by your health care provider. Make sure you discuss any questions you have with your health care provider. Document Revised: 08/19/2021 Document Reviewed: 08/19/2021 Elsevier Patient Education  2023 Else[employer] Inc. Upper Endoscopy, Adult Upper endoscopy is a procedure to look inside the upper GI (gastrointestinal) tract. The upper GI tract is made up of: The esophagus. This is the part of the body that moves food from your mouth to your stomach. The stomach. The duodenum. This is the first part of your small intestine. This procedure is also called esophagogastroduodenoscopy (EGD) or gastroscopy. In this procedure, your health care provider passes a thin, flexible tube (endoscope) through your mouth and down your esophagus into your stomach and into your duodenum. A small camera is attached to the end of the tube. Images from the camera appear on a monitor in the exam room. During this procedure, your health care provider may also remove a small piece of tissue to be sent to a lab and examined under a microscope (biopsy). Your health care provider may do an upper endoscopy to diagnose cancers of the upper GI tract. You may also have this procedure to find the cause of other conditions, such as: Stomach pain. Heartburn. Pain or problems when swallowing. Nausea and vomiting. Stomach bleeding. Stomach ulcers. Tell a health care provider about: Any allergies you have. All medicines you are taking, including vitamins, herbs, eye drops, creams, and over-the-counter medicines. Any problems you or family members have had with anesthetic medicines. Any bleeding problems you have. Any surgeries you have had. Any medical conditions you have. Whether you are pregnant or may be pregnant. What are the risks? Your healthcare provider will talk with you about risks.  These may include: Infection. Bleeding. Allergic reactions to medicines. A tear or hole (perforation) in the esophagus, stomach, or duodenum. What happens before the procedure? When to stop eating and drinking Follow instructions from your health care provider about what you may eat and drink. These may include: 8 hours before your procedure Stop eating most foods. Do not eat meat, fried foods, or fatty foods. Eat only light foods, such as toast or crackers. All liquids are okay except energy drinks and alcohol. 6 hours before your procedure Stop eating. Drink only clear liquids, such as water, clear fruit juice, black coffee, plain tea, and sports drinks. Do not drink energy drinks or alcohol. 2 hours before your procedure Stop drinking all liquids. You may be allowed to take medicines with small sips of water. If you do not follow your health care provider's instructions, your procedure may be delayed or  canceled. Medicines Ask your health care provider about: Changing or stopping your regular medicines. This is especially important if you are taking diabetes medicines or blood thinners. Taking medicines such as aspirin and ibuprofen. These medicines can thin your blood. Do not take these medicines unless your health care provider tells you to take them. Taking over-the-counter medicines, vitamins, herbs, and supplements. General instructions If you will be going home right after the procedure, plan to have a responsible adult: Take you home from the hospital or clinic. You will not be allowed to drive. Care for you for the time you are told. What happens during the procedure?  An IV will be inserted into one of your veins. You may be given one or more of the following: A medicine to help you relax (sedative). A medicine to numb the throat (local anesthetic). You will lie on your left side on an exam table. Your health care provider will pass the endoscope through your mouth and  down your esophagus. Your health care provider will use the scope to check the inside of your esophagus, stomach, and duodenum. Biopsies may be taken. The endoscope will be removed. The procedure may vary among health care providers and hospitals. What happens after the procedure? Your blood pressure, heart rate, breathing rate, and blood oxygen level will be monitored until you leave the hospital or clinic. When your throat is no longer numb, you may be given some fluids to drink. If you were given a sedative during the procedure, it can affect you for several hours. Do not drive or operate machinery until your health care provider says that it is safe. It is up to you to get the results of your procedure. Ask your health care provider, or the department that is doing the procedure, when your results will be ready. Contact a health care provider if you: Have a sore throat that lasts longer than 1 day. Have a fever. Get help right away if you: Vomit blood or your vomit looks like coffee grounds. Have bloody, black, or tarry stools. Have a very bad sore throat or you cannot swallow. Have difficulty breathing or very bad pain in your chest or abdomen. These symptoms may be an emergency. Get help right away. Call 911. Do not wait to see if the symptoms will go away. Do not drive yourself to the hospital. Summary Upper endoscopy is a procedure to look inside the upper GI tract. During the procedure, an IV will be inserted into one of your veins. You may be given a medicine to help you relax. The endoscope will be passed through your mouth and down your esophagus. Follow instructions from your health care provider about what you can eat and drink. This information is not intended to replace advice given to you by your health care provider. Make sure you discuss any questions you have with your health care provider. Document Revised: 08/19/2021 Document Reviewed: 08/19/2021 Elsevier Patient  Education  2023 [employer].

## 2022-04-08 DIAGNOSIS — Z79899 Other long term (current) drug therapy: Secondary | ICD-10-CM | POA: Diagnosis not present

## 2022-04-09 ENCOUNTER — Other Ambulatory Visit: Payer: Self-pay

## 2022-04-09 ENCOUNTER — Encounter (HOSPITAL_COMMUNITY): Payer: Self-pay

## 2022-04-09 ENCOUNTER — Encounter (HOSPITAL_COMMUNITY)
Admission: RE | Admit: 2022-04-09 | Discharge: 2022-04-09 | Disposition: A | Discharge status: Home / Self Care | Payer: Medicaid Other | Source: Ambulatory Visit | Attending: Internal Medicine | Admitting: Internal Medicine

## 2022-04-09 VITALS — BP 109/73 | HR 61 | Temp 97.5°F | Resp 18 | Ht 61.0 in | Wt 165.6 lb

## 2022-04-09 DIAGNOSIS — Z01818 Encounter for other preprocedural examination: Secondary | ICD-10-CM | POA: Insufficient documentation

## 2022-04-09 DIAGNOSIS — Z8619 Personal history of other infectious and parasitic diseases: Secondary | ICD-10-CM | POA: Insufficient documentation

## 2022-04-09 DIAGNOSIS — R001 Bradycardia, unspecified: Secondary | ICD-10-CM | POA: Insufficient documentation

## 2022-04-09 LAB — CBC
HCT: 41.1 % (ref 36.0–46.0)
Hemoglobin: 13.6 g/dL (ref 12.0–15.0)
MCH: 29.7 pg (ref 26.0–34.0)
MCHC: 33.1 g/dL (ref 30.0–36.0)
MCV: 89.7 fL (ref 80.0–100.0)
Platelets: 200 10*3/uL (ref 150–400)
RBC: 4.58 MIL/uL (ref 3.87–5.11)
RDW: 14.6 % (ref 11.5–15.5)
WBC: 7.2 10*3/uL (ref 4.0–10.5)
nRBC: 0 % (ref 0.0–0.2)

## 2022-04-09 LAB — COMPREHENSIVE METABOLIC PANEL
ALT: 18 U/L (ref 0–44)
AST: 18 U/L (ref 15–41)
Albumin: 4.3 g/dL (ref 3.5–5.0)
Alkaline Phosphatase: 65 U/L (ref 38–126)
Anion gap: 10 (ref 5–15)
BUN: 17 mg/dL (ref 6–20)
CO2: 26 mmol/L (ref 22–32)
Calcium: 9.3 mg/dL (ref 8.9–10.3)
Chloride: 105 mmol/L (ref 98–111)
Creatinine, Ser: 0.63 mg/dL (ref 0.44–1.00)
GFR, Estimated: >60 mL/min (ref >60)
Glucose, Bld: 91 mg/dL (ref 70–99)
Potassium: 4.1 mmol/L (ref 3.5–5.1)
Sodium: 141 mmol/L (ref 135–145)
Total Bilirubin: 0.5 mg/dL (ref 0.3–1.2)
Total Protein: 7.7 g/dL (ref 6.5–8.1)

## 2022-04-14 ENCOUNTER — Ambulatory Visit (HOSPITAL_COMMUNITY): Payer: Medicaid Other | Admitting: Certified Registered"

## 2022-04-14 ENCOUNTER — Encounter (HOSPITAL_COMMUNITY): Payer: Self-pay | Admitting: Internal Medicine

## 2022-04-14 ENCOUNTER — Ambulatory Visit (HOSPITAL_BASED_OUTPATIENT_CLINIC_OR_DEPARTMENT_OTHER): Payer: Medicaid Other | Admitting: Certified Registered"

## 2022-04-14 ENCOUNTER — Encounter (HOSPITAL_COMMUNITY)
Admission: RE | Disposition: A | Discharge status: Home / Self Care | Payer: Self-pay | Source: Home / Self Care | Attending: Internal Medicine

## 2022-04-14 ENCOUNTER — Ambulatory Visit (HOSPITAL_COMMUNITY)
Admission: RE | Admit: 2022-04-14 | Discharge: 2022-04-14 | Disposition: A | Discharge status: Home / Self Care | Payer: Medicaid Other | Attending: Internal Medicine | Admitting: Internal Medicine

## 2022-04-14 DIAGNOSIS — K219 Gastro-esophageal reflux disease without esophagitis: Secondary | ICD-10-CM | POA: Insufficient documentation

## 2022-04-14 DIAGNOSIS — K449 Diaphragmatic hernia without obstruction or gangrene: Secondary | ICD-10-CM | POA: Diagnosis not present

## 2022-04-14 DIAGNOSIS — F419 Anxiety disorder, unspecified: Secondary | ICD-10-CM

## 2022-04-14 DIAGNOSIS — K7460 Unspecified cirrhosis of liver: Secondary | ICD-10-CM | POA: Diagnosis not present

## 2022-04-14 DIAGNOSIS — I10 Essential (primary) hypertension: Secondary | ICD-10-CM | POA: Insufficient documentation

## 2022-04-14 DIAGNOSIS — Z79899 Other long term (current) drug therapy: Secondary | ICD-10-CM | POA: Diagnosis not present

## 2022-04-14 DIAGNOSIS — Z8711 Personal history of peptic ulcer disease: Secondary | ICD-10-CM | POA: Diagnosis not present

## 2022-04-14 DIAGNOSIS — Z13810 Encounter for screening for upper gastrointestinal disorder: Secondary | ICD-10-CM | POA: Diagnosis not present

## 2022-04-14 DIAGNOSIS — K2289 Other specified disease of esophagus: Secondary | ICD-10-CM

## 2022-04-14 DIAGNOSIS — F319 Bipolar disorder, unspecified: Secondary | ICD-10-CM | POA: Diagnosis not present

## 2022-04-14 HISTORY — PX: ESOPHAGOGASTRODUODENOSCOPY (EGD) WITH PROPOFOL: SHX5813

## 2022-04-14 SURGERY — ESOPHAGOGASTRODUODENOSCOPY (EGD) WITH PROPOFOL
Anesthesia: General

## 2022-04-14 MED ORDER — PROPOFOL 500 MG/50ML IV EMUL
INTRAVENOUS | Status: AC
  Filled 2022-04-14: qty 100

## 2022-04-14 MED ORDER — PROPOFOL 10 MG/ML IV BOLUS
INTRAVENOUS | Status: DC | PRN
  Administered 2022-04-14: 100 mg via INTRAVENOUS

## 2022-04-14 MED ORDER — LACTATED RINGERS IV SOLN: INTRAVENOUS | Status: DC | PRN

## 2022-04-14 NOTE — Transfer of Care (Signed)
Immediate Anesthesia Transfer of Care Note  Patient: Rachel Hudson  Procedure(s) Performed: ESOPHAGOGASTRODUODENOSCOPY (EGD) WITH PROPOFOL  Patient Location: PACU  Anesthesia Type:General  Level of Consciousness: drowsy and patient cooperative  Airway & Oxygen Therapy: Patient Spontanous Breathing and Patient connected to nasal cannula oxygen  Post-op Assessment: Report given to RN, Post -op Vital signs reviewed and stable, and Patient moving all extremities  Post vital signs: Reviewed and stable  Last Vitals:  Vitals Value Taken Time  BP    Temp    Pulse    Resp    SpO2      Last Pain:  Vitals:   04/14/22 0833  TempSrc:   PainSc: 0-No pain         Complications: No notable events documented.

## 2022-04-14 NOTE — Op Note (Signed)
[facility] Patient Name: Rachel Hudson Procedure Date: 04/14/2022 7:40 AM MRN: [id number] Date of Birth: 1961-03-07 Attending MD: Gennette Pac , MD, [id number] CSN: [id number] Age: 61 Admit Type: Outpatient Procedure:                Upper GI endoscopy Indications:              Variceal screening (no known varices or prior                            bleeding) Providers:                Gennette Pac, MD, Buel Ream. Thomasena Edis RN, RN,                            Durwin Glaze Tech, Technician Referring MD:              Medicines:                Propofol per Anesthesia Complications:            No immediate complications. Estimated Blood Loss:     Estimated blood loss: none. Procedure:                Pre-Anesthesia Assessment:                           - Prior to the procedure, a History and Physical                            was performed, and patient medications and                            allergies were reviewed. The patient's tolerance of                            previous anesthesia was also reviewed. The risks                            and benefits of the procedure and the sedation                            options and risks were discussed with the patient.                            All questions were answered, and informed consent                            was obtained. Prior Anticoagulants: The patient has                            taken no anticoagulant or antiplatelet agents. ASA                            Grade Assessment: III - A patient with severe  systemic disease. After reviewing the risks and                            benefits, the patient was deemed in satisfactory                            condition to undergo the procedure.                           After obtaining informed consent, the endoscope was                            passed under direct vision. Throughout the                            procedure, the  patient's blood pressure, pulse, and                            oxygen saturations were monitored continuously. The                            GIF-H190 ([id number] scope was introduced through the                            mouth, and advanced to the second part of duodenum.                            The upper GI endoscopy was accomplished without                            difficulty. The patient tolerated the procedure                            well. Scope In: 8:41:59 AM Scope Out: 8:47:11 AM Total Procedure Duration: 0 hours 5 minutes 12 seconds  Findings:      The examined esophagus was normal. Patulous EG junction.      A small hiatal hernia was present. Normal-appearing gastric mucosa.       Patent pylorus.      The duodenal bulb and second portion of the duodenum were normal. Impression:               - Normal esophagus.                           - Small hiatal hernia.                           - Normal duodenal bulb and second portion of the                            duodenum.                           - No specimens collected. Moderate Sedation:      Moderate (conscious) sedation was personally administered by an  anesthesia professional. The following parameters were monitored: oxygen       saturation, heart rate, blood pressure, respiratory rate, EKG, adequacy       of pulmonary ventilation, and response to care. Recommendation:           - Patient has a contact number available for                            emergencies. The signs and symptoms of potential                            delayed complications were discussed with the                            patient. Return to normal activities tomorrow.                            Written discharge instructions were provided to the                            patient.                           - Advance diet as tolerated.                           - Continue present medications.                           - Return to my  office in 6 months. Potential future                            screening EGD would be guided by KPa (greater than                            20) and/or platelet count less than 150,000. Procedure Code(s):        --- Professional ---                           [id number], Esophagogastroduodenoscopy, flexible,                            transoral; diagnostic, including collection of                            specimen(s) by brushing or washing, when performed                            (separate procedure) Diagnosis Code(s):        --- Professional ---                           K44.9, Diaphragmatic hernia without obstruction or                            gangrene  Z13.810, Encounter for screening for upper                            gastrointestinal disorder CPT copyright 2022 American [employer] Association. All rights reserved. The codes documented in this report are preliminary and upon coder review may  be revised to meet current compliance requirements. Gerrit Friends. Bellagrace Sylvan, MD Gennette Pac, MD 04/14/2022 8:55:41 AM This report has been signed electronically. Number of Addenda: 0

## 2022-04-14 NOTE — H&P (Signed)
@LOGO @   Primary Care Physician:  Ladell Pier, MD Primary Gastroenterologist:  Dr. Gala Romney  Pre-Procedure History & Physical: HPI:  Rachel Hudson is a 61 y.o. female here for history of HCV cirrhosis s/p eradication here for a screening EGD.  Patient has GERD well-controlled on pantoprazole.  No dysphagia.  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   Foot pain, bilateral    GERD (gastroesophageal reflux disease)    Gout    Hand pain    Hep C w/o coma, chronic ([place]) As of 10/22/13   S/p treatment with Harvoni, achieved SVR   Hyperlipidemia    Hypertension    Joint pain    Liver problem    Osteoarthritis    Other fatigue    Shortness of breath    Shortness of breath on exertion    Vitamin D deficiency     Past Surgical History:  Procedure Laterality Date   BACK SURGERY     lumbar fusion   BIOPSY  09/12/2017   Procedure: BIOPSY;  Surgeon: Daneil Dolin, MD;  [facility];  Service: Endoscopy;;  gastric   CARPAL TUNNEL RELEASE Right 11/13/2015   Procedure: RIGHT CARPAL TUNNEL RELEASE;  Surgeon: Leanora Cover, MD;  Location: [place];  Service: Orthopedics;  Laterality: Right;   CARPAL TUNNEL RELEASE Left 01/29/2016   Procedure: left CARPAL TUNNEL RELEASE;  Surgeon: Leanora Cover, MD;  Location: [place];  Service: Orthopedics;  Laterality: Left;   COLONOSCOPY N/A 07/08/2015   Surgeon: Daneil Dolin, MD; internal hemorrhoids, colonic diverticulosis.  Repeat in 10 years.   ESOPHAGOGASTRODUODENOSCOPY N/A 07/08/2015   Surgeon: Daneil Dolin, MD; erosive reflux esophagitis, hiatal hernia, gastric erosion and ulcer s/p biopsy (reactive gastropathy, negative H. pylori), no esophageal varices or portal hypertension.   ESOPHAGOGASTRODUODENOSCOPY (EGD) WITH PROPOFOL  N/A 09/12/2017   Surgeon: Daneil Dolin, MD; normal esophagus s/p dilation, portal hypertensive gastropathy, erosive gastropathy biopsied (negative for H. pylori), normal examined duodenum.   ESOPHAGOGASTRODUODENOSCOPY (EGD) WITH PROPOFOL N/A 10/25/2019   Surgeon: Daneil Dolin, MD;  normal esophagus s/p dilation, mild erosive reflux esophagitis, portal hypertensive gastropathy, due for repeat in 2023.   FINGER ARTHROSCOPY WITH CARPOMETACARPEL [facility]) ARTHROPLASTY Right 04/24/2020   Procedure: RIGHT INDEX FINGER METACARPAL PHALANGEAL ARTHROPLASTY;  Surgeon: Leanora Cover, MD;  Location: [place];  Service: Orthopedics;  Laterality: Right;   LIGAMENT REPAIR Right 1980   Knee   MALONEY DILATION N/A 09/12/2017   Procedure: Venia Minks DILATION;  Surgeon: Daneil Dolin, MD;  [facility];  Service: Endoscopy;  Laterality: N/A;   MALONEY DILATION N/A 10/25/2019   Procedure: Venia Minks DILATION;  Surgeon: Daneil Dolin, MD;  [facility];  Service: Endoscopy;  Laterality: N/A;   TRIGGER FINGER RELEASE Right 04/24/2020   Procedure: RIGHT LONG TRIGGER FINGER RELEASE TRIGGER FINGER/A-1 PULLEY;  Surgeon: Leanora Cover, MD;  Location: [place];  Service: Orthopedics;  Laterality: Right;    Prior to Admission medications   Medication Sig Start Date End Date Taking? Authorizing Provider  amLODipine (NORVASC) 10 MG tablet Take 1 tablet by mouth once daily 11/26/21  Yes Ladell Pier, MD  Brimonidine Tartrate (LUMIFY  OP) Place 1 drop into both eyes daily.   Yes [provider]  celecoxib (CELEBREX) 200 MG capsule Take 1 capsule by mouth once daily 11/02/21  Yes Ladell Pier, MD  Cholecalciferol (VITAMIN D-3 PO) Take 1 tablet by mouth daily.   Yes [provider]  colchicine 0.6 MG tablet TAKE 1 TABLET BY MOUTH TWICE DAILY . APPOINTMENT REQUIRED FOR FUTURE REFILLS 11/26/21  Yes Ladell Pier, MD  COLLAGEN PO Take 1 Scoop by  mouth in the morning, at noon, in the evening, and at bedtime.   Yes [provider]  gabapentin (NEURONTIN) 800 MG tablet TAKE 1 TABLET BY MOUTH EVERY 6 HOURS Patient taking differently: Take 1,600 mg by mouth in the morning and at bedtime. 12/23/21  Yes Ladell Pier, MD  MILK THISTLE PO Take 175 mg by mouth daily.   Yes [provider]  Multiple Vitamin (MULTIVITAMIN WITH MINERALS) TABS tablet Take 1 tablet by mouth daily. Centrum Multivitamin   Yes [provider]  Nutritional Supplements (LIVER DEFENSE PO) Take 2 tablets by mouth in the morning. Liverite Liver Aid   Yes [provider]  Omega-3 Fatty Acids (FISH OIL PO) Take 1 capsule by mouth in the morning.   Yes [provider]  Oxycodone HCl 10 MG TABS Take 10 mg by mouth 5 (five) times daily as needed (pain.).   Yes [provider]  pantoprazole (PROTONIX) 40 MG tablet TAKE 1 TABLET BY MOUTH TWICE DAILY BEFORE A MEAL 09/28/21  Yes Mahala Menghini, PA-C    Allergies as of 03/17/2022 - Review Complete 03/17/2022  Allergen Reaction Noted   Penicillins  03/28/2011   Tylenol [acetaminophen] Other (See Comments) 07/14/2014    Family History  Adopted: Yes  Problem Relation Age of Onset   Colon cancer Neg Hx    Breast cancer Neg Hx     Social History   Socioeconomic History   Marital status: Single    Spouse name: Not on file   Number of children: Not on file   Years of education: Not on file   Highest education level: Not on file  Occupational History   Occupation: disabled  Tobacco Use   Smoking status: Never   Smokeless tobacco: Never  Vaping Use   Vaping Use: Never used  Substance and Sexual Activity   Alcohol use: Not Currently   Drug use: Yes    Frequency: 7.0 times per week    Types: Marijuana    Comment: daily for arthritis   Sexual activity: Not Currently    Partners: Male    Birth control/protection: None, Condom  Other Topics Concern   Not on file   Social History Narrative   Not on file   Social Determinants of Health   Financial Resource Strain: Not on file  Food Insecurity: Not on file  Transportation Needs: Not on file  Physical Activity: Not on file  Stress: Not on file  Social Connections: Not on file  Intimate Partner Violence: Not on file    Review of Systems: See HPI, otherwise negative ROS  Physical Exam: BP 125/75   Pulse (!) 50   Temp 98.9 F (37.2 C) (Oral)   Resp 18   LMP 09/29/2016 (Approximate)   SpO2 95%  General:   Alert,  Well-developed, well-nourished, pleasant and cooperative in NAD al adenopathy. Lungs:  Clear throughout to auscultation.   No wheezes, crackles, or rhonchi. No acute distress. Heart:  Regular rate and rhythm; no  murmurs, clicks, rubs,  or gallops. Abdomen: Non-distended, normal bowel sounds.  Soft and nontender without appreciable mass or hepatosplenomegaly.  Pulses:  Normal pulses noted. Extremities:  Without clubbing or edema.  Impression/Plan: 61 year old lady with a history of HCC cirrhosis status post HCV eradication.  Here for screening EGD.  GERD well-controlled.  No dysphagia.  I have offered the patient a screening EGD per plan. The risks, benefits, limitations, alternatives and imponderables have been reviewed with the patient. Potential for esophageal dilation, biopsy, etc. have also been reviewed.  Questions have been answered. All parties agreeable.      Notice: This dictation was prepared with Dragon dictation along with smaller phrase technology. Any transcriptional errors that result from this process are unintentional and may not be corrected upon review.

## 2022-04-14 NOTE — Anesthesia Preprocedure Evaluation (Signed)
Anesthesia Evaluation  Patient identified by MRN, date of birth, ID band Patient awake    Reviewed: Allergy & Precautions, H&P , NPO status , Patient's Chart, lab work & pertinent test results, reviewed documented beta blocker date and time   Airway Mallampati: II  TM Distance: >3 FB Neck ROM: full    Dental no notable dental hx.    Pulmonary neg pulmonary ROS, shortness of breath   Pulmonary exam normal breath sounds clear to auscultation       Cardiovascular Exercise Tolerance: Good hypertension, negative cardio ROS  Rhythm:regular Rate:Normal     Neuro/Psych  PSYCHIATRIC DISORDERS Anxiety  Bipolar Disorder    Neuromuscular disease negative neurological ROS  negative psych ROS   GI/Hepatic negative GI ROS, PUD,GERD  Medicated,,(+) Cirrhosis       , Hepatitis -  Endo/Other  negative endocrine ROS    Renal/GU negative Renal ROS  negative genitourinary   Musculoskeletal   Abdominal   Peds  Hematology negative hematology ROS (+)   Anesthesia Other Findings   Reproductive/Obstetrics negative OB ROS                             Anesthesia Physical Anesthesia Plan  ASA: 3  Anesthesia Plan: General   Post-op Pain Management:    Induction:   PONV Risk Score and Plan: Propofol infusion  Airway Management Planned:   Additional Equipment:   Intra-op Plan:   Post-operative Plan:   Informed Consent: I have reviewed the patients History and Physical, chart, labs and discussed the procedure including the risks, benefits and alternatives for the proposed anesthesia with the patient or authorized representative who has indicated his/her understanding and acceptance.     Dental Advisory Given  Plan Discussed with: CRNA  Anesthesia Plan Comments:        Anesthesia Quick Evaluation

## 2022-04-14 NOTE — Discharge Instructions (Addendum)
EGD Discharge instructions Please read the instructions outlined below and refer to this sheet in the next few weeks. These discharge instructions provide you with general information on caring for yourself after you leave the hospital. Your doctor may also give you specific instructions. While your treatment has been planned according to the most current medical practices available, unavoidable complications occasionally occur. If you have any problems or questions after discharge, please call your doctor. ACTIVITY You may resume your regular activity but move at a slower pace for the next 24 hours.  Take frequent rest periods for the next 24 hours.  Walking will help expel (get rid of) the air and reduce the bloated feeling in your abdomen.  No driving for 24 hours (because of the anesthesia (medicine) used during the test).  You may shower.  Do not sign any important legal documents or operate any machinery for 24 hours (because of the anesthesia used during the test).  NUTRITION Drink plenty of fluids.  You may resume your normal diet.  Begin with a light meal and progress to your normal diet.  Avoid alcoholic beverages for 24 hours or as instructed by your caregiver.  MEDICATIONS You may resume your normal medications unless your caregiver tells you otherwise.  WHAT YOU CAN EXPECT TODAY You may experience abdominal discomfort such as a feeling of fullness or "gas" pains.  FOLLOW-UP Your doctor will discuss the results of your test with you.  SEEK IMMEDIATE MEDICAL ATTENTION IF ANY OF THE FOLLOWING OCCUR: Excessive nausea (feeling sick to your stomach) and/or vomiting.  Severe abdominal pain and distention (swelling).  Trouble swallowing.  Temperature over 101 F (37.8 C).  Rectal bleeding or vomiting of blood.     No esophageal varices found today  Office visit with [name] in 6 months if not already scheduled  At patient request, I called Chrissie Noa hurdle at [phone] -reviewed  findings and recommendations

## 2022-04-16 NOTE — Anesthesia Postprocedure Evaluation (Signed)
Anesthesia Post Note  Patient: Rachel Hudson  Procedure(s) Performed: ESOPHAGOGASTRODUODENOSCOPY (EGD) WITH PROPOFOL  Patient location during evaluation: Phase II Anesthesia Type: General Level of consciousness: awake Pain management: pain level controlled Vital Signs Assessment: post-procedure vital signs reviewed and stable Respiratory status: spontaneous breathing and respiratory function stable Cardiovascular status: blood pressure returned to baseline and stable Postop Assessment: no headache and no apparent nausea or vomiting Anesthetic complications: no Comments: Late entry   No notable events documented.   Last Vitals:  Vitals:   04/14/22 0746 04/14/22 0852  BP: 125/75 (!) 94/58  Pulse: (!) 50 (!) 58  Resp: 18 17  Temp: 37.2 C 36.6 C  SpO2: 95% 94%    Last Pain:  Vitals:   04/14/22 0852  TempSrc: Oral  PainSc: 0-No pain                 Windell Norfolk

## 2022-04-22 ENCOUNTER — Encounter (HOSPITAL_COMMUNITY): Payer: Self-pay | Admitting: Internal Medicine

## 2022-04-23 ENCOUNTER — Ambulatory Visit: Payer: Medicaid Other | Attending: Internal Medicine | Admitting: Internal Medicine

## 2022-04-23 VITALS — BP 116/72 | HR 54 | Temp 98.3°F | Ht 61.0 in | Wt 165.0 lb

## 2022-04-23 DIAGNOSIS — M19041 Primary osteoarthritis, right hand: Secondary | ICD-10-CM

## 2022-04-23 DIAGNOSIS — M19042 Primary osteoarthritis, left hand: Secondary | ICD-10-CM

## 2022-04-23 DIAGNOSIS — H5203 Hypermetropia, bilateral: Secondary | ICD-10-CM | POA: Diagnosis not present

## 2022-04-23 DIAGNOSIS — I10 Essential (primary) hypertension: Secondary | ICD-10-CM

## 2022-04-23 DIAGNOSIS — B182 Chronic viral hepatitis C: Secondary | ICD-10-CM

## 2022-04-23 DIAGNOSIS — K7460 Unspecified cirrhosis of liver: Secondary | ICD-10-CM

## 2022-04-23 DIAGNOSIS — E66811 Obesity, class 1: Secondary | ICD-10-CM

## 2022-04-23 DIAGNOSIS — E669 Obesity, unspecified: Secondary | ICD-10-CM

## 2022-04-23 DIAGNOSIS — F3111 Bipolar disorder, current episode manic without psychotic features, mild: Secondary | ICD-10-CM

## 2022-04-23 DIAGNOSIS — F1290 Cannabis use, unspecified, uncomplicated: Secondary | ICD-10-CM

## 2022-04-23 DIAGNOSIS — K766 Portal hypertension: Secondary | ICD-10-CM | POA: Insufficient documentation

## 2022-04-23 NOTE — Progress Notes (Signed)
Patient ID: Rachel CottaFrances M Hudson, female    DOB: 1960/06/11  MRN: [id number]  CC: Hypertension (HTN f/u. Dallie Piles/Already received flu vax this season. )   Subjective: Rachel Hudson is a 61 y.o. female who presents for chronic ds management Her concerns today include:  Patient with history of HTN, HL, hepatitis C (treated) with cirrhosis followed by GI, GERD and chronic lower back pain with hx surgery for spinal fixation of L4-L5, OA hands, bipolar affective ds, CTS hands.  [facility].    HTN: Doing well on Norvasc.  She limits salt in the foods.  No chest pains or shortness of breath.  No swelling in the legs.  History of hepatic cirrhosis with hepatitis C eradicated.  Recently saw gastroenterologist Dr. Jena Gaussourk and had EGD done.  This came back okay except for small hiatal hernia.  Obesity: Weight is stable.  She does a lot of walking.  She tries to eat healthy.  Stays away from sugary drinks and fatty foods.  Complains of poor vision especially trying to see things close up.  Last eye exam was 20 years ago.  Requests referral for eye exam.  Smokes marijuana daily.  States that it helps quite a bit with her chronic pain issues and with her bipolar disorder.  She is not under the care of a behavioral health specialist and does not wish to be.  HM: [facility].  Thinks she may have had her COVID booster as well but not certain about that.  She will ask and if she did not she will get it.  Patient Active Problem List   Diagnosis Date Noted   Marijuana user 03/30/2021   Bipolar 1 disorder (HCC) 01/28/2021   Insulin resistance 01/28/2021   Other hyperlipidemia 12/11/2020   Class 1 obesity with serious comorbidity and body mass index (BMI) of 32.0 to 32.9 in adult 12/03/2020   Influenza vaccine needed 02/26/2020   Bipolar affective disorder, currently manic, mild (HCC) 09/12/2017   Dysphagia 08/24/2017   Neuropathy of both  feet 04/05/2017   Status post lumbar spinal fusion 10/22/2016   GERD (gastroesophageal reflux disease) 08/09/2016   Spondylolisthesis of lumbar region 07/29/2016   Sinus bradycardia 06/15/2016   Gingivitis 06/15/2016   ASCUS favor benign 11/13/2015   History of hepatitis C 10/13/2015   Bilateral carpal tunnel syndrome 09/16/2015   Cervical spondylosis without myelopathy 09/16/2015   Primary osteoarthritis of both hands 09/16/2015   Diverticulosis of colon without hemorrhage    Peptic ulcer disease    Spondylosis of lumbar region without myelopathy or radiculopathy 03/21/2015   Lumbago 11/18/2014   Carpal tunnel syndrome 11/18/2014   Hepatic cirrhosis (HCC) 08/06/2014   HTN (hypertension) 02/28/2014     Current Outpatient Medications on File Prior to Visit  Medication Sig Dispense Refill   amLODipine (NORVASC) 10 MG tablet Take 1 tablet by mouth once daily 90 tablet 1   Brimonidine Tartrate (LUMIFY OP) Place 1 drop into both eyes daily.     celecoxib (CELEBREX) 200 MG capsule Take 1 capsule by mouth once daily 90 capsule 1   Cholecalciferol (VITAMIN D-3 PO) Take 1 tablet by mouth daily.     colchicine 0.6 MG tablet TAKE 1 TABLET BY MOUTH TWICE DAILY . APPOINTMENT REQUIRED FOR FUTURE REFILLS 180 tablet 3   COLLAGEN PO Take 1 Scoop by mouth in the morning, at noon, in the evening, and at bedtime.  gabapentin (NEURONTIN) 800 MG tablet TAKE 1 TABLET BY MOUTH EVERY 6 HOURS (Patient taking differently: Take 1,600 mg by mouth in the morning and at bedtime.) 120 tablet 3   MILK THISTLE PO Take 175 mg by mouth daily.     Multiple Vitamin (MULTIVITAMIN WITH MINERALS) TABS tablet Take 1 tablet by mouth daily. Centrum Multivitamin     Nutritional Supplements (LIVER DEFENSE PO) Take 2 tablets by mouth in the morning. Liverite Liver Aid     Omega-3 Fatty Acids (FISH OIL PO) Take 1 capsule by mouth in the morning.     Oxycodone HCl 10 MG TABS Take 10 mg by mouth 5 (five) times daily as needed  (pain.).     pantoprazole (PROTONIX) 40 MG tablet TAKE 1 TABLET BY MOUTH TWICE DAILY BEFORE A MEAL 60 tablet 5   No current facility-administered medications on file prior to visit.    Allergies  Allergen Reactions   Penicillins     Facial swelling Has patient had a PCN reaction causing immediate rash, facial/tongue/throat swelling, SOB or lightheadedness with hypotension: Yes Has patient had a PCN reaction causing severe rash involving mucus membranes or skin necrosis: No Has patient had a PCN reaction that required hospitalization No Has patient had a PCN reaction occurring within the last 10 years: Yes If all of the above answers are "NO", then may proceed with Cephalosporin use.    Tylenol [Acetaminophen] Other (See Comments)    HX of Hep. C    Social History   Socioeconomic History   Marital status: Single    Spouse name: Not on file   Number of children: Not on file   Years of education: Not on file   Highest education level: Not on file  Occupational History   Occupation: disabled  Tobacco Use   Smoking status: Never   Smokeless tobacco: Never  Vaping Use   Vaping Use: Never used  Substance and Sexual Activity   Alcohol use: Not Currently   Drug use: Yes    Frequency: 7.0 times per week    Types: Marijuana    Comment: daily for arthritis   Sexual activity: Not Currently    Partners: Male    Birth control/protection: None, Condom  Other Topics Concern   Not on file  Social History Narrative   Not on file   Social Determinants of Health   Financial Resource Strain: Not on file  Food Insecurity: Not on file  Transportation Needs: Not on file  Physical Activity: Not on file  Stress: Not on file  Social Connections: Not on file  Intimate Partner Violence: Not on file    Family History  Adopted: Yes  Problem Relation Age of Onset   Colon cancer Neg Hx    Breast cancer Neg Hx     Past Surgical History:  Procedure Laterality Date   BACK SURGERY      lumbar fusion   BIOPSY  09/12/2017   Procedure: BIOPSY;  Surgeon: Corbin Ade, MD;  [facility];  Service: Endoscopy;;  gastric   CARPAL TUNNEL RELEASE Right 11/13/2015   Procedure: RIGHT CARPAL TUNNEL RELEASE;  Surgeon: Betha Loa, MD;  Location: [facility];  Service: Orthopedics;  Laterality: Right;   CARPAL TUNNEL RELEASE Left 01/29/2016   Procedure: left CARPAL TUNNEL RELEASE;  Surgeon: Betha Loa, MD;  Location: [facility];  Service: Orthopedics;  Laterality: Left;   COLONOSCOPY N/A 07/08/2015   Surgeon: Corbin Ade, MD; internal  hemorrhoids, colonic diverticulosis.  Repeat in 10 years.   ESOPHAGOGASTRODUODENOSCOPY N/A 07/08/2015   Surgeon: Corbin Ade, MD; erosive reflux esophagitis, hiatal hernia, gastric erosion and ulcer s/p biopsy (reactive gastropathy, negative H. pylori), no esophageal varices or portal hypertension.   ESOPHAGOGASTRODUODENOSCOPY (EGD) WITH PROPOFOL N/A 09/12/2017   Surgeon: Corbin Ade, MD; normal esophagus s/p dilation, portal hypertensive gastropathy, erosive gastropathy biopsied (negative for H. pylori), normal examined duodenum.   ESOPHAGOGASTRODUODENOSCOPY (EGD) WITH PROPOFOL N/A 10/25/2019   Surgeon: Corbin Ade, MD;  normal esophagus s/p dilation, mild erosive reflux esophagitis, portal hypertensive gastropathy, due for repeat in 2023.   ESOPHAGOGASTRODUODENOSCOPY (EGD) WITH PROPOFOL N/A 04/14/2022   Procedure: ESOPHAGOGASTRODUODENOSCOPY (EGD) WITH PROPOFOL;  Surgeon: Corbin Ade, MD;  [facility];  Service: Endoscopy;  Laterality: N/A;  11:15 am, pt knows to arrive at 7:30   FINGER ARTHROSCOPY WITH CARPOMETACARPEL [facility]) ARTHROPLASTY Right 04/24/2020   Procedure: RIGHT INDEX FINGER METACARPAL PHALANGEAL ARTHROPLASTY;  Surgeon: Betha Loa, MD;  Location: [facility];  Service: Orthopedics;  Laterality: Right;   LIGAMENT REPAIR Right 1980   Knee   MALONEY DILATION N/A  09/12/2017   Procedure: Elease Hashimoto DILATION;  Surgeon: Corbin Ade, MD;  [facility];  Service: Endoscopy;  Laterality: N/A;   MALONEY DILATION N/A 10/25/2019   Procedure: Elease Hashimoto DILATION;  Surgeon: Corbin Ade, MD;  [facility];  Service: Endoscopy;  Laterality: N/A;   TRIGGER FINGER RELEASE Right 04/24/2020   Procedure: RIGHT LONG TRIGGER FINGER RELEASE TRIGGER FINGER/A-1 PULLEY;  Surgeon: Betha Loa, MD;  Location:  SURGERY CENTER;  Service: Orthopedics;  Laterality: Right;    ROS: Review of Systems Negative except as stated above  PHYSICAL EXAM: BP 116/72 (BP Location: Left Arm, Patient Position: Sitting, Cuff Size: Normal)   Pulse (!) 54   Temp 98.3 F (36.8 C) (Oral)   Ht 5\' 1"  (1.549 m)   Wt 165 lb (74.8 kg)   LMP 09/29/2016 (Approximate)   SpO2 97%   BMI 31.18 kg/m   Wt Readings from Last 3 Encounters:  04/23/22 165 lb (74.8 kg)  04/09/22 165 lb 9.1 oz (75.1 kg)  03/17/22 165 lb 9.6 oz (75.1 kg)    Physical Exam  General appearance - alert, well appearing, older AAF and in no distress Mental status - normal mood, behavior, speech, dress, motor activity, and thought processes Chest - clear to auscultation, no wheezes, rales or rhonchi, symmetric air entry Heart - normal rate, regular rhythm, normal S1, S2, no murmurs, rubs, clicks or gallops Extremities - peripheral pulses normal, no pedal edema, no clubbing or cyanosis      Latest Ref Rng & Units 04/09/2022    9:40 AM 03/22/2022    9:01 AM 11/11/2021    1:51 PM  CMP  Glucose 70 - 99 mg/dL 91  95  96   BUN 6 - 20 mg/dL 17  12  13    Creatinine 0.44 - 1.00 mg/dL 11/13/2021   4.82   Sodium 135 - 145 mmol/L 141  140  142   Potassium 3.5 - 5.1 mmol/L 4.1  4.6  4.2   Chloride 98 - 111 mmol/L 105  102  103   CO2 22 - 32 mmol/L 26  25  26    Calcium 8.9 - 10.3 mg/dL 9.3  9.5  9.5   Total Protein 6.5 - 8.1 g/dL 7.7  7.2  7.3   Total Bilirubin 0.3 - 1.2 mg/dL 0.5  0.3  0.3    Alkaline Phos 38 - 126 U/L 65  71  77   AST 15 - 41 U/L 18  18  17    ALT 0 - 44 U/L 18  17  15     Lipid Panel     Component Value Date/Time   CHOL 216 (H) 10/15/2020 0925   TRIG 78 10/15/2020 0925   HDL 51 10/15/2020 0925   CHOLHDL 5.1 (H) 10/26/2019 1022   CHOLHDL 2.8 09/28/2013 0904   VLDL 25 09/28/2013 0904   LDLCALC 151 (H) 10/15/2020 0925    CBC    Component Value Date/Time   WBC 7.2 04/09/2022 0940   RBC 4.58 04/09/2022 0940   HGB 13.6 04/09/2022 0940   HGB 13.9 03/22/2022 0901   HCT 41.1 04/09/2022 0940   HCT 42.1 03/22/2022 0901   PLT 200 04/09/2022 0940   PLT 200 03/22/2022 0901   MCV 89.7 04/09/2022 0940   MCV 88 03/22/2022 0901   MCH 29.7 04/09/2022 0940   MCHC 33.1 04/09/2022 0940   RDW 14.6 04/09/2022 0940   RDW 13.7 03/22/2022 0901   LYMPHSABS 3.5 (H) 03/22/2022 0901   MONOABS 0.5 03/02/2018 0904   EOSABS 0.2 03/22/2022 0901   BASOSABS 0.1 03/22/2022 0901    ASSESSMENT AND PLAN:  1. Essential hypertension At goal.  Continue Norvasc daily.  2. Obesity (BMI 30.0-34.9) Encouraged her to continue to move is much as she can.  Discussed and encouraged her to continue trying to eat healthy.  3. Hyperopia of both eyes - Ambulatory referral to Ophthalmology  4. Bipolar affective disorder, currently manic, mild (HCC) Patient declines referral to mental health.  States that she is doing well with this by smoking marijuana daily.  5. Hepatic cirrhosis due to chronic hepatitis C infection (HCC) Cirrhosis stable and followed by gastroenterology.  Hep C has been eradicated.  6. Primary osteoarthritis of both hands Doing well on Celebrex.  7. Marijuana user    Patient was given the opportunity to ask questions.  Patient verbalized understanding of the plan and was able to repeat key elements of the plan.   This documentation was completed using 03/24/2022.  Any transcriptional errors are unintentional.  No orders of the  defined types were placed in this encounter.    Requested Prescriptions    No prescriptions requested or ordered in this encounter    No follow-ups on file.  09-25-1992, MD, FACP

## 2022-05-03 DIAGNOSIS — Z79899 Other long term (current) drug therapy: Secondary | ICD-10-CM | POA: Diagnosis not present

## 2022-05-10 ENCOUNTER — Other Ambulatory Visit: Payer: Self-pay | Admitting: Internal Medicine

## 2022-05-10 DIAGNOSIS — M19041 Primary osteoarthritis, right hand: Secondary | ICD-10-CM

## 2022-05-10 DIAGNOSIS — Z79899 Other long term (current) drug therapy: Secondary | ICD-10-CM | POA: Diagnosis not present

## 2022-05-31 ENCOUNTER — Other Ambulatory Visit: Payer: Self-pay | Admitting: Internal Medicine

## 2022-05-31 DIAGNOSIS — I10 Essential (primary) hypertension: Secondary | ICD-10-CM

## 2022-06-08 ENCOUNTER — Other Ambulatory Visit: Payer: Self-pay | Admitting: Internal Medicine

## 2022-06-08 DIAGNOSIS — M47816 Spondylosis without myelopathy or radiculopathy, lumbar region: Secondary | ICD-10-CM

## 2022-06-08 DIAGNOSIS — G5603 Carpal tunnel syndrome, bilateral upper limbs: Secondary | ICD-10-CM

## 2022-06-15 DIAGNOSIS — Z79899 Other long term (current) drug therapy: Secondary | ICD-10-CM | POA: Diagnosis not present

## 2022-06-18 DIAGNOSIS — Z79899 Other long term (current) drug therapy: Secondary | ICD-10-CM | POA: Diagnosis not present

## 2022-07-14 DIAGNOSIS — Z79899 Other long term (current) drug therapy: Secondary | ICD-10-CM | POA: Diagnosis not present

## 2022-07-16 ENCOUNTER — Other Ambulatory Visit: Payer: Self-pay | Admitting: Gastroenterology

## 2022-07-16 DIAGNOSIS — Z79899 Other long term (current) drug therapy: Secondary | ICD-10-CM | POA: Diagnosis not present

## 2022-07-26 ENCOUNTER — Encounter: Payer: Self-pay | Admitting: Gastroenterology

## 2022-08-13 DIAGNOSIS — Z79899 Other long term (current) drug therapy: Secondary | ICD-10-CM | POA: Diagnosis not present

## 2022-08-16 DIAGNOSIS — Z79899 Other long term (current) drug therapy: Secondary | ICD-10-CM | POA: Diagnosis not present

## 2022-08-27 DIAGNOSIS — Z79899 Other long term (current) drug therapy: Secondary | ICD-10-CM | POA: Diagnosis not present

## 2022-08-30 ENCOUNTER — Telehealth: Payer: Self-pay | Admitting: Internal Medicine

## 2022-08-30 ENCOUNTER — Other Ambulatory Visit: Payer: Self-pay | Admitting: Internal Medicine

## 2022-08-30 DIAGNOSIS — M19041 Primary osteoarthritis, right hand: Secondary | ICD-10-CM

## 2022-08-30 NOTE — Telephone Encounter (Signed)
Copied from CRM [id number]. Topic: General - Other >> Aug 30, 2022 11:34 AM Everette C wrote: Reason for CRM: Medication Refill - Medication: celecoxib (CELEBREX) 200 MG capsule [[id number]] - the patient has been roughly 2 weeks without them   Has the patient contacted their pharmacy? Yes.   (Agent: If no, request that the patient contact the pharmacy for the refill. If patient does not wish to contact the pharmacy document the reason why and proceed with request.) (Agent: If yes, when and what did the pharmacy advise?)  Preferred Pharmacy (with phone number or street name): [facility] - [place], [place] - [address] [place] [address] Phone: [phone] Fax: [phone] Hours: Not open 24 hours   Has the patient been seen for an appointment in the last year OR does the patient have an upcoming appointment? Yes.    Agent: Please be advised that RX refills may take up to 3 business days. We ask that you follow-up with your pharmacy.

## 2022-08-30 NOTE — Telephone Encounter (Signed)
Duplicate request, already requested by the pharmacy on 08/30/22, waiting approval.

## 2022-08-31 ENCOUNTER — Telehealth: Payer: Self-pay | Admitting: Internal Medicine

## 2022-08-31 DIAGNOSIS — Z79899 Other long term (current) drug therapy: Secondary | ICD-10-CM | POA: Diagnosis not present

## 2022-08-31 DIAGNOSIS — M19042 Primary osteoarthritis, left hand: Secondary | ICD-10-CM

## 2022-08-31 MED ORDER — CELECOXIB 200 MG PO CAPS: 200.0000 mg | ORAL_CAPSULE | Freq: Every day | ORAL | 0 refills | Status: DC

## 2022-08-31 NOTE — Telephone Encounter (Signed)
Rx sent 

## 2022-08-31 NOTE — Telephone Encounter (Signed)
Medication Refill - Medication: CELEBREX) 200 MG capsule   Has the patient contacted their pharmacy? Yes.   Waiting for provider to send the script  Preferred Pharmacy (with phone number or street name):  [facility] - [place], [place] - [address] Phone: [phone]  Fax: [phone]     Has the patient been seen for an appointment in the last year OR does the patient have an upcoming appointment? Yes.    Agent: Please be advised that RX refills may take up to 3 business days. We ask that you follow-up with your pharmacy.  Patient called back again to see why she does not have her medication. Please advise patient

## 2022-09-09 DIAGNOSIS — Z79899 Other long term (current) drug therapy: Secondary | ICD-10-CM | POA: Diagnosis not present

## 2022-09-13 DIAGNOSIS — Z79899 Other long term (current) drug therapy: Secondary | ICD-10-CM | POA: Diagnosis not present

## 2022-10-04 DIAGNOSIS — R0602 Shortness of breath: Secondary | ICD-10-CM | POA: Diagnosis not present

## 2022-10-04 DIAGNOSIS — Z7901 Long term (current) use of anticoagulants: Secondary | ICD-10-CM | POA: Diagnosis not present

## 2022-10-04 DIAGNOSIS — I10 Essential (primary) hypertension: Secondary | ICD-10-CM | POA: Diagnosis not present

## 2022-10-04 DIAGNOSIS — I6523 Occlusion and stenosis of bilateral carotid arteries: Secondary | ICD-10-CM | POA: Diagnosis not present

## 2022-10-04 DIAGNOSIS — I700 Atherosclerosis of aorta: Secondary | ICD-10-CM | POA: Diagnosis not present

## 2022-10-05 ENCOUNTER — Ambulatory Visit: Payer: Medicaid Other | Admitting: Gastroenterology

## 2022-10-05 ENCOUNTER — Encounter: Payer: Self-pay | Admitting: Gastroenterology

## 2022-10-06 DIAGNOSIS — Z79899 Other long term (current) drug therapy: Secondary | ICD-10-CM | POA: Diagnosis not present

## 2022-10-07 ENCOUNTER — Ambulatory Visit
Admission: EM | Admit: 2022-10-07 | Discharge: 2022-10-07 | Disposition: A | Discharge status: Home / Self Care | Payer: Medicaid Other | Attending: Family Medicine | Admitting: Family Medicine

## 2022-10-07 DIAGNOSIS — R42 Dizziness and giddiness: Secondary | ICD-10-CM | POA: Diagnosis not present

## 2022-10-07 DIAGNOSIS — R202 Paresthesia of skin: Secondary | ICD-10-CM

## 2022-10-07 NOTE — Discharge Instructions (Signed)
Keep your follow-up appointments for your testing and doctor visits.

## 2022-10-07 NOTE — ED Triage Notes (Signed)
Pt states dizziness and weakness to right wrist for over a month.  States today her whole right arm is weak and painful,also having pain to the right side of her head.

## 2022-10-07 NOTE — ED Provider Notes (Signed)
[facility]    CSN: [id number] Arrival date & time: 10/07/22  1347      History   Chief Complaint Chief Complaint  Patient presents with   Dizziness    HPI Rachel Hudson is a 62 y.o. female.    Dizziness  Here for some tingling in her right arm from her shoulder to her hand.  This has been going on for about a month.  Also for about 1 month she has had some weakness in the muscles of her forearm and hand.  She states she is she has had carpal tunnel in the past and she feels like that might be contributing.  She has also been told that she has some trouble in her neck, possibly arthritis, that could be contributing to the symptoms.  She is also had some headache off and on and has been told that she has "calcium".  Apparently she just had labs done at cardiology and will be having other vascular test coming up.  She is not having any chest pain or shortness of breath headache is not persistent  She is also had some dizziness/lightheadedness.  Again lab work was just done recently  Past Medical History:  Diagnosis Date   ADD (attention deficit disorder)    ADHD    Anxiety    Arthritis    Arthritis    Back pain    Bipolar 1 disorder (HCC)    Cirrhosis of liver (HCC) 01/2014   Stage 4   Foot pain, bilateral    GERD (gastroesophageal reflux disease)    Gout    Hand pain    Hep C w/o coma, chronic (HCC) As of 10/22/13   S/p treatment with Harvoni, achieved SVR   Hyperlipidemia    Hypertension    Joint pain    Liver problem    Osteoarthritis    Other fatigue    Shortness of breath    Shortness of breath on exertion    Vitamin D deficiency     Patient Active Problem List   Diagnosis Date Noted   Portal hypertension (HCC) 04/23/2022   Marijuana user 03/30/2021   Bipolar 1 disorder (HCC) 01/28/2021   Insulin resistance 01/28/2021   Other hyperlipidemia 12/11/2020   Class 1 obesity with serious comorbidity and body mass index (BMI) of 32.0 to 32.9 in  adult 12/03/2020   Influenza vaccine needed 02/26/2020   Bipolar affective disorder, currently manic, mild (HCC) 09/12/2017   Dysphagia 08/24/2017   Neuropathy of both feet 04/05/2017   Status post lumbar spinal fusion 10/22/2016   GERD (gastroesophageal reflux disease) 08/09/2016   Spondylolisthesis of lumbar region 07/29/2016   Sinus bradycardia 06/15/2016   Gingivitis 06/15/2016   ASCUS favor benign 11/13/2015   History of hepatitis C 10/13/2015   Bilateral carpal tunnel syndrome 09/16/2015   Cervical spondylosis without myelopathy 09/16/2015   Primary osteoarthritis of both hands 09/16/2015   Diverticulosis of colon without hemorrhage    Peptic ulcer disease    Spondylosis of lumbar region without myelopathy or radiculopathy 03/21/2015   Lumbago 11/18/2014   Carpal tunnel syndrome 11/18/2014   Hepatic cirrhosis (HCC) 08/06/2014   HTN (hypertension) 02/28/2014    Past Surgical History:  Procedure Laterality Date   BACK SURGERY     lumbar fusion   BIOPSY  09/12/2017   Procedure: BIOPSY;  Surgeon: Corbin Ade, MD;  [facility];  Service: Endoscopy;;  gastric   CARPAL TUNNEL RELEASE Right 11/13/2015  Procedure: RIGHT CARPAL TUNNEL RELEASE;  Surgeon: Betha Loa, MD;  Location: [facility];  Service: Orthopedics;  Laterality: Right;   CARPAL TUNNEL RELEASE Left 01/29/2016   Procedure: left CARPAL TUNNEL RELEASE;  Surgeon: Betha Loa, MD;  Location: [facility];  Service: Orthopedics;  Laterality: Left;   COLONOSCOPY N/A 07/08/2015   Surgeon: Corbin Ade, MD; internal hemorrhoids, colonic diverticulosis.  Repeat in 10 years.   ESOPHAGOGASTRODUODENOSCOPY N/A 07/08/2015   Surgeon: Corbin Ade, MD; erosive reflux esophagitis, hiatal hernia, gastric erosion and ulcer s/p biopsy (reactive gastropathy, negative H. pylori), no esophageal varices or portal hypertension.   ESOPHAGOGASTRODUODENOSCOPY (EGD) WITH PROPOFOL N/A 09/12/2017    Surgeon: Corbin Ade, MD; normal esophagus s/p dilation, portal hypertensive gastropathy, erosive gastropathy biopsied (negative for H. pylori), normal examined duodenum.   ESOPHAGOGASTRODUODENOSCOPY (EGD) WITH PROPOFOL N/A 10/25/2019   Surgeon: Corbin Ade, MD;  normal esophagus s/p dilation, mild erosive reflux esophagitis, portal hypertensive gastropathy, due for repeat in 2023.   ESOPHAGOGASTRODUODENOSCOPY (EGD) WITH PROPOFOL N/A 04/14/2022   Procedure: ESOPHAGOGASTRODUODENOSCOPY (EGD) WITH PROPOFOL;  Surgeon: Corbin Ade, MD;  [facility];  Service: Endoscopy;  Laterality: N/A;  11:15 am, pt knows to arrive at 7:30   FINGER ARTHROSCOPY WITH CARPOMETACARPEL [facility]) ARTHROPLASTY Right 04/24/2020   Procedure: RIGHT INDEX FINGER METACARPAL PHALANGEAL ARTHROPLASTY;  Surgeon: Betha Loa, MD;  Location: [facility];  Service: Orthopedics;  Laterality: Right;   LIGAMENT REPAIR Right 1980   Knee   MALONEY DILATION N/A 09/12/2017   Procedure: Elease Hashimoto DILATION;  Surgeon: Corbin Ade, MD;  [facility];  Service: Endoscopy;  Laterality: N/A;   MALONEY DILATION N/A 10/25/2019   Procedure: Elease Hashimoto DILATION;  Surgeon: Corbin Ade, MD;  [facility];  Service: Endoscopy;  Laterality: N/A;   TRIGGER FINGER RELEASE Right 04/24/2020   Procedure: RIGHT LONG TRIGGER FINGER RELEASE TRIGGER FINGER/A-1 PULLEY;  Surgeon: Betha Loa, MD;  Location:  SURGERY CENTER;  Service: Orthopedics;  Laterality: Right;    OB History     Gravida  0   Para  0   Term  0   Preterm  0   AB  0   Living  0      SAB  0   IAB  0   Ectopic  0   Multiple  0   Live Births  0            Home Medications    Prior to Admission medications   Medication Sig Start Date End Date Taking? Authorizing Provider  amLODipine (NORVASC) 10 MG tablet Take 1 tablet by mouth once daily 05/31/22   Marcine Matar, MD  Brimonidine Tartrate (LUMIFY  OP) Place 1 drop into both eyes daily.    [provider]  celecoxib (CELEBREX) 200 MG capsule Take 1 capsule (200 mg total) by mouth daily. 08/31/22   Marcine Matar, MD  Cholecalciferol (VITAMIN D-3 PO) Take 1 tablet by mouth daily.    [provider]  colchicine 0.6 MG tablet TAKE 1 TABLET BY MOUTH TWICE DAILY . APPOINTMENT REQUIRED FOR FUTURE REFILLS 11/26/21   Marcine Matar, MD  COLLAGEN PO Take 1 Scoop by mouth in the morning, at noon, in the evening, and at bedtime.    [provider]  gabapentin (NEURONTIN) 800 MG tablet TAKE 1 TABLET BY MOUTH EVERY 6 HOURS 06/08/22   Marcine Matar, MD  MILK THISTLE PO  Take 175 mg by mouth daily.    [provider]  Multiple Vitamin (MULTIVITAMIN WITH MINERALS) TABS tablet Take 1 tablet by mouth daily. Centrum Multivitamin    [provider]  Nutritional Supplements (LIVER DEFENSE PO) Take 2 tablets by mouth in the morning. Liverite Liver Aid    [provider]  Omega-3 Fatty Acids (FISH OIL PO) Take 1 capsule by mouth in the morning.    [provider]  Oxycodone HCl 10 MG TABS Take 10 mg by mouth 5 (five) times daily as needed (pain.).    [provider]  pantoprazole (PROTONIX) 40 MG tablet Take one tablet once to twice daily before a meal as needed for acid reflux. 07/19/22   Tiffany Kocher, PA-C    Family History Family History  Adopted: Yes  Problem Relation Age of Onset   Colon cancer Neg Hx    Breast cancer Neg Hx     Social History Social History   Tobacco Use   Smoking status: Never   Smokeless tobacco: Never  Vaping Use   Vaping Use: Never used  Substance Use Topics   Alcohol use: Not Currently   Drug use: Yes    Frequency: 7.0 times per week    Types: Marijuana    Comment: daily for arthritis     Allergies   Penicillins and Tylenol [acetaminophen]   Review of Systems Review of Systems  Neurological:  Positive for dizziness.      Physical Exam Triage Vital Signs ED Triage Vitals  Enc Vitals Group     BP 10/07/22 1417 120/74     Pulse Rate 10/07/22 1417 60     Resp 10/07/22 1417 16     Temp 10/07/22 1417 98.5 F (36.9 C)     Temp Source 10/07/22 1417 Oral     SpO2 10/07/22 1417 96 %     Weight --      Height --      Head Circumference --      Peak Flow --      Pain Score 10/07/22 1419 10     Pain Loc --      Pain Edu? --      Excl. in GC? --    No data found.  Updated Vital Signs BP 120/74 (BP Location: Right Arm)   Pulse 60   Temp 98.5 F (36.9 C) (Oral)   Resp 16   LMP 09/29/2016 (Approximate)   SpO2 96%   Visual Acuity Right Eye Distance:   Left Eye Distance:   Bilateral Distance:    Right Eye Near:   Left Eye Near:    Bilateral Near:     Physical Exam Vitals reviewed.  Constitutional:      General: She is not in acute distress.    Appearance: She is not ill-appearing, toxic-appearing or diaphoretic.     Comments: Blood pressure here is 120/74 and heart rate is 60  Eyes:     Extraocular Movements: Extraocular movements intact.     Conjunctiva/sclera: Conjunctivae normal.     Pupils: Pupils are equal, round, and reactive to light.  Cardiovascular:     Rate and Rhythm: Normal rate and regular rhythm.     Heart sounds: No murmur heard. Pulmonary:     Effort: Pulmonary effort is normal.     Breath sounds: Normal breath sounds.  Musculoskeletal:     Cervical back: Neck supple.  Lymphadenopathy:     Cervical: No cervical adenopathy.  Skin:  Capillary Refill: Capillary refill takes less than 2 seconds.     Coloration: Skin is not jaundiced or pale.  Neurological:     General: No focal deficit present.     Mental Status: She is alert and oriented to person, place, and time.  Psychiatric:        Behavior: Behavior normal.      UC Treatments / Results  Labs (all labs ordered are listed, but only abnormal results are displayed) Labs Reviewed - No data to  display  EKG   Radiology No results found.  Procedures Procedures (including critical care time)  Medications Ordered in UC Medications - No data to display  Initial Impression / Assessment and Plan / UC Course  I have reviewed the triage vital signs and the nursing notes.  Pertinent labs & imaging results that were available during my care of the patient were reviewed by me and considered in my medical decision making (see chart for details).      Her visits are reviewed with primary care.  Last labs from November 23 is reviewed, and her calcium is normal then.  With her vital signs being reassuring, also fine her history reassuring.  I do not think she has any sort of acute cerebrovascular event happening.  We discussed that she could be having some radiculopathy from her neck.  She will be having further testing with cardiology.  Reassurance provided  Final Clinical Impressions(s) / UC Diagnoses   Final diagnoses:  Dizziness and giddiness  Paresthesia of right arm     Discharge Instructions      Keep your follow-up appointments for your testing and doctor visits.     ED Prescriptions   None    PDMP not reviewed this encounter.   Zenia Resides, MD 10/07/22 1430

## 2022-10-25 ENCOUNTER — Ambulatory Visit: Payer: Medicaid Other | Admitting: Internal Medicine

## 2022-10-25 DIAGNOSIS — R0602 Shortness of breath: Secondary | ICD-10-CM | POA: Diagnosis not present

## 2022-10-26 DIAGNOSIS — Z79899 Other long term (current) drug therapy: Secondary | ICD-10-CM | POA: Diagnosis not present

## 2022-10-28 DIAGNOSIS — Z79899 Other long term (current) drug therapy: Secondary | ICD-10-CM | POA: Diagnosis not present

## 2022-11-04 DIAGNOSIS — I700 Atherosclerosis of aorta: Secondary | ICD-10-CM | POA: Diagnosis not present

## 2022-11-04 DIAGNOSIS — I6523 Occlusion and stenosis of bilateral carotid arteries: Secondary | ICD-10-CM | POA: Diagnosis not present

## 2022-11-15 ENCOUNTER — Other Ambulatory Visit: Payer: Self-pay | Admitting: *Deleted

## 2022-11-15 ENCOUNTER — Telehealth: Payer: Self-pay | Admitting: *Deleted

## 2022-11-15 ENCOUNTER — Ambulatory Visit (INDEPENDENT_AMBULATORY_CARE_PROVIDER_SITE_OTHER): Payer: Medicaid Other | Admitting: Gastroenterology

## 2022-11-15 ENCOUNTER — Encounter: Payer: Self-pay | Admitting: Gastroenterology

## 2022-11-15 VITALS — BP 134/76 | HR 64 | Temp 97.9°F | Ht 61.0 in | Wt 158.6 lb

## 2022-11-15 DIAGNOSIS — K7460 Unspecified cirrhosis of liver: Secondary | ICD-10-CM

## 2022-11-15 DIAGNOSIS — K219 Gastro-esophageal reflux disease without esophagitis: Secondary | ICD-10-CM | POA: Diagnosis not present

## 2022-11-15 NOTE — Telephone Encounter (Signed)
Pt informed that RUQ [name] is scheduled for Monday, 11/22/22 at [facility], arrive at 9:45 am, NPO after midnight. Verbalized understanding.

## 2022-11-15 NOTE — Patient Instructions (Signed)
Please complete labs and ultrasound. You can take lab orders to [facility] in [place]. The office will be in touch with ultrasound appointment.  Continue pantoprazole once to twice daily before a meal. Return office visit in six months.

## 2022-11-15 NOTE — Progress Notes (Signed)
GI Office Note    Referring Provider: Marcine Matar, MD Primary Care Physician:  Marcine Matar, MD  Primary Gastroenterologist: Roetta Sessions, MD   Chief Complaint   Chief Complaint  Patient presents with   Follow-up    Doing well    History of Present Illness   Rachel Hudson is a 62 y.o. female presenting today for follow up. She has h/o HCV related cirrhosis. HCV successfully treated. History of ongoing mild etoh use, 2 ounces of wine nightly at time of last ov. Currently not consuming etoh. MELD 6 in 02/2022. U/S at that time with no hepatoma.   Today: doing well. No abdominal pain. Appetite good. No dysphagia. No heartburn. BMs regular. No melena, brbpr. No swelling. Trying to lose weight. Taking Collagen Lean, started couple of weeks ago. Going to start exercising.   EGD 03/2022: -normal esophagus -small hh -future screening EGD guided by kPA >20 and/or platelet count less than 150,000  Colonoscopy February 2017: -Internal hemorrhoids -Colonic diverticulosis -Next colonoscopy 2027.  Medications   Current Outpatient Medications  Medication Sig Dispense Refill   amLODipine (NORVASC) 10 MG tablet Take 1 tablet by mouth once daily 90 tablet 1   Brimonidine Tartrate (LUMIFY OP) Place 1 drop into both eyes daily.     celecoxib (CELEBREX) 200 MG capsule Take 1 capsule (200 mg total) by mouth daily. 90 capsule 0   Cholecalciferol (VITAMIN D-3 PO) Take 1 tablet by mouth daily.     colchicine 0.6 MG tablet TAKE 1 TABLET BY MOUTH TWICE DAILY . APPOINTMENT REQUIRED FOR FUTURE REFILLS 180 tablet 3   COLLAGEN PO Take 1 Scoop by mouth in the morning, at noon, in the evening, and at bedtime.     gabapentin (NEURONTIN) 800 MG tablet TAKE 1 TABLET BY MOUTH EVERY 6 HOURS 120 tablet 5   lovastatin (MEVACOR) 10 MG tablet Take 10 mg by mouth daily.     MILK THISTLE PO Take 175 mg by mouth daily.     Multiple Vitamin (MULTIVITAMIN WITH MINERALS) TABS tablet Take 1  tablet by mouth daily. Centrum Multivitamin     Nutritional Supplements (LIVER DEFENSE PO) Take 2 tablets by mouth in the morning. Liverite Liver Aid     Omega-3 Fatty Acids (FISH OIL PO) Take 1 capsule by mouth in the morning.     pantoprazole (PROTONIX) 40 MG tablet Take one tablet once to twice daily before a meal as needed for acid reflux. 60 tablet 11   No current facility-administered medications for this visit.    Allergies   Allergies as of 11/15/2022 - Review Complete 11/15/2022  Allergen Reaction Noted   Penicillins  03/28/2011   Tylenol [acetaminophen] Other (See Comments) 07/14/2014       Review of Systems   General: Negative for anorexia, unintentional weight loss, fever, chills, fatigue, weakness. ENT: Negative for hoarseness, difficulty swallowing , nasal congestion. CV: Negative for chest pain, angina, palpitations, dyspnea on exertion, peripheral edema.  Respiratory: Negative for dyspnea at rest, dyspnea on exertion, cough, sputum, wheezing.  GI: See history of present illness. GU:  Negative for dysuria, hematuria, urinary incontinence, urinary frequency, nocturnal urination.  Endo: Negative for unusual weight change.     Physical Exam   BP 134/76 (BP Location: Right Arm, Patient Position: Sitting, Cuff Size: Normal)   Pulse 64   Temp 97.9 F (36.6 C) (Oral)   Ht 5\' 1"  (1.549 m)   Wt 158 lb 9.6 oz (71.9 kg)  LMP 09/29/2016 (Approximate)   SpO2 97%   BMI 29.97 kg/m    General: Well-nourished, well-developed in no acute distress.  Eyes: No icterus. Mouth: Oropharyngeal mucosa moist and pink    Abdomen: Bowel sounds are normal, nontender, nondistended, no hepatosplenomegaly or masses,  no abdominal bruits or hernia , no rebound or guarding.  Rectal: not performed Extremities: No lower extremity edema. No clubbing or deformities. Neuro: Alert and oriented x 4   Skin: Warm and dry, no jaundice.   Psych: Alert and cooperative, normal mood and  affect.  Labs   Lab Results  Component Value Date   CREATININE 0.63 04/09/2022   BUN 17 04/09/2022   NA 141 04/09/2022   K 4.1 04/09/2022   CL 105 04/09/2022   CO2 26 04/09/2022   Lab Results  Component Value Date   ALT 18 04/09/2022   AST 18 04/09/2022   ALKPHOS 65 04/09/2022   BILITOT 0.5 04/09/2022   Lab Results  Component Value Date   WBC 7.2 04/09/2022   HGB 13.6 04/09/2022   HCT 41.1 04/09/2022   MCV 89.7 04/09/2022   PLT 200 04/09/2022   Lab Results  Component Value Date   INR 1.0 03/22/2022   INR 1.0 11/11/2021   INR 1.1 08/14/2019    Imaging Studies   No results found.  Assessment   Cirrhosis: prior HCV, successfully treated. Clinically stable. No history of decompensation. Due for labs and hepatoma screening. She is trying to lose some excess weight. Recently started collagen lean, herbal regimen for weight loss. Also takes several otc supplements including milk thistle. Continue to monitor labs.   Gerd: doing well.    PLAN   Continue pantoprazole once to twice daily to control reflux. Update labs and abdominal u/s. She wants to complete both in [place].  Return ov in six months.   Leanna Battles. Melvyn Neth, MHS, PA-C [facility]

## 2022-11-22 ENCOUNTER — Ambulatory Visit (HOSPITAL_COMMUNITY)
Admission: RE | Admit: 2022-11-22 | Discharge: 2022-11-22 | Disposition: A | Discharge status: Home / Self Care | Payer: Medicaid Other | Source: Ambulatory Visit | Attending: Gastroenterology | Admitting: Gastroenterology

## 2022-11-22 DIAGNOSIS — K7460 Unspecified cirrhosis of liver: Secondary | ICD-10-CM

## 2022-11-22 DIAGNOSIS — K7689 Other specified diseases of liver: Secondary | ICD-10-CM | POA: Diagnosis not present

## 2022-11-30 ENCOUNTER — Other Ambulatory Visit: Payer: Self-pay | Admitting: Internal Medicine

## 2022-11-30 DIAGNOSIS — Z79899 Other long term (current) drug therapy: Secondary | ICD-10-CM | POA: Diagnosis not present

## 2022-11-30 DIAGNOSIS — Z7901 Long term (current) use of anticoagulants: Secondary | ICD-10-CM | POA: Diagnosis not present

## 2022-11-30 DIAGNOSIS — M19041 Primary osteoarthritis, right hand: Secondary | ICD-10-CM

## 2022-11-30 NOTE — Telephone Encounter (Signed)
Requested Prescriptions  Pending Prescriptions Disp Refills   celecoxib (CELEBREX) 200 MG capsule [Pharmacy Med Name: Celecoxib 200 MG Oral Capsule] 90 capsule 0    Sig: Take 1 capsule by mouth once daily     Analgesics:  COX2 Inhibitors Failed - 11/30/2022 10:59 AM      Failed - Manual Review: Labs are only required if the patient has taken medication for more than 8 weeks.      Passed - HGB in normal range and within 360 days    Hemoglobin  Date Value Ref Range Status  04/09/2022 13.6 12.0 - 15.0 g/dL Final  16/02/9603 54.0 11.1 - 15.9 g/dL Final         Passed - Cr in normal range and within 360 days    Creat  Date Value Ref Range Status  06/15/2016 0.76 0.50 - 1.05 mg/dL Final    Comment:      For patients > or = 61 years of age: The upper reference limit for Creatinine is approximately 13% higher for people identified as African-American.      Creatinine, Ser  Date Value Ref Range Status  04/09/2022 0.63 0.44 - 1.00 mg/dL Final         Passed - HCT in normal range and within 360 days    HCT  Date Value Ref Range Status  04/09/2022 41.1 36.0 - 46.0 % Final   Hematocrit  Date Value Ref Range Status  03/22/2022 42.1 34.0 - 46.6 % Final         Passed - AST in normal range and within 360 days    AST  Date Value Ref Range Status  04/09/2022 18 15 - 41 U/L Final         Passed - ALT in normal range and within 360 days    ALT  Date Value Ref Range Status  04/09/2022 18 0 - 44 U/L Final         Passed - eGFR is 30 or above and within 360 days    GFR, Est African American  Date Value Ref Range Status  06/15/2016 >89 >=60 mL/min Final   GFR calc Af Amer  Date Value Ref Range Status  08/14/2019 >60 >60 mL/min Final   GFR, Est Non African American  Date Value Ref Range Status  06/15/2016 89 >=60 mL/min Final   GFR, Estimated  Date Value Ref Range Status  04/09/2022 >60 >60 mL/min Final    Comment:    (NOTE) Calculated using the CKD-EPI Creatinine  Equation (2021)    eGFR  Date Value Ref Range Status  03/22/2022 84 >59 mL/min/1.73 Final         Passed - Patient is not pregnant      Passed - Valid encounter within last 12 months    Recent Outpatient Visits           7 months ago Bipolar affective disorder, currently manic, mild (HCC)   [place] [facility] Marcine Matar, MD   1 year ago Essential hypertension   [place] [facility] Marcine Matar, MD   1 year ago Essential hypertension   [place] [facility] Marcine Matar, MD   2 years ago Essential hypertension   [place] [facility] Marcine Matar, MD   2 years ago Patient left before evaluation by physician   [facility] [place], Gavin Pound  B, MD       Future Appointments             In 1 month Claiborne Rigg, NP [facility]

## 2022-12-01 ENCOUNTER — Other Ambulatory Visit: Payer: Self-pay | Admitting: Internal Medicine

## 2022-12-01 DIAGNOSIS — I10 Essential (primary) hypertension: Secondary | ICD-10-CM

## 2022-12-01 DIAGNOSIS — G5603 Carpal tunnel syndrome, bilateral upper limbs: Secondary | ICD-10-CM

## 2022-12-01 DIAGNOSIS — M47816 Spondylosis without myelopathy or radiculopathy, lumbar region: Secondary | ICD-10-CM

## 2022-12-01 NOTE — Telephone Encounter (Signed)
Requested Prescriptions  Pending Prescriptions Disp Refills   gabapentin (NEURONTIN) 800 MG tablet [Pharmacy Med Name: Gabapentin 800 MG Oral Tablet] 120 tablet 0    Sig: TAKE 1 TABLET BY MOUTH EVERY 6 HOURS     Neurology: Anticonvulsants - gabapentin Passed - 12/01/2022  9:08 AM      Passed - Cr in normal range and within 360 days    Creat  Date Value Ref Range Status  06/15/2016 0.76 0.50 - 1.05 mg/dL Final    Comment:      For patients > or = 61 years of age: The upper reference limit for Creatinine is approximately 13% higher for people identified as African-American.      Creatinine, Ser  Date Value Ref Range Status  04/09/2022 0.63 0.44 - 1.00 mg/dL Final         Passed - Completed PHQ-2 or PHQ-9 in the last 360 days      Passed - Valid encounter within last 12 months    Recent Outpatient Visits           7 months ago Bipolar affective disorder, currently manic, mild (HCC)   [place] [facility] Marcine Matar, MD   1 year ago Essential hypertension   [place] [facility] Marcine Matar, MD   1 year ago Essential hypertension   [place] [facility] Marcine Matar, MD   2 years ago Essential hypertension   [place] [facility] Marcine Matar, MD   2 years ago Patient left before evaluation by physician   [place] [facility] Marcine Matar, MD       Future Appointments             In 1 month Claiborne Rigg, NP [facility]             amLODipine (NORVASC) 10 MG tablet [Pharmacy Med Name: amLODIPine Besylate 10 MG Oral Tablet] 90 tablet 0    Sig: Take 1 tablet by mouth once daily     Cardiovascular: Calcium Channel Blockers 2 Failed - 12/01/2022  9:08 AM      Failed - Valid encounter within last 6 months    Recent Outpatient Visits           7 months ago Bipolar  affective disorder, currently manic, mild (HCC)   [place] [facility] Marcine Matar, MD   1 year ago Essential hypertension   [place] [facility] Marcine Matar, MD   1 year ago Essential hypertension   [place] [facility] Marcine Matar, MD   2 years ago Essential hypertension   [place] [facility] Marcine Matar, MD   2 years ago Patient left before evaluation by physician   [place] [facility] Marcine Matar, MD       Future Appointments             In 1 month Claiborne Rigg, NP [place] [facility]            Passed - Last BP in normal range    BP Readings from Last 1 Encounters:  11/15/22 134/76         Passed - Last Heart Rate in normal range  Pulse Readings from Last 1 Encounters:  11/15/22 64

## 2022-12-16 ENCOUNTER — Telehealth: Payer: Self-pay

## 2022-12-16 NOTE — Telephone Encounter (Signed)
Please let her know her labs were excellent. MELD 3.0 indicating preserved liver function.   Please make sure she has ov or NIC for ov in six months.

## 2022-12-16 NOTE — Telephone Encounter (Signed)
Labs are scanned under media for review. Pt called lmom wanting to know if you received them.

## 2022-12-16 NOTE — Addendum Note (Signed)
Addended by: Tiffany Kocher on: 12/16/2022 09:39 PM   Modules accepted: Orders

## 2022-12-17 NOTE — Telephone Encounter (Signed)
Pt was made aware and verbalized understanding.   Mandy, please schedule pt an appt in 6 mths

## 2022-12-24 DIAGNOSIS — I6523 Occlusion and stenosis of bilateral carotid arteries: Secondary | ICD-10-CM | POA: Diagnosis not present

## 2022-12-24 DIAGNOSIS — I10 Essential (primary) hypertension: Secondary | ICD-10-CM | POA: Diagnosis not present

## 2022-12-24 DIAGNOSIS — I700 Atherosclerosis of aorta: Secondary | ICD-10-CM | POA: Diagnosis not present

## 2022-12-24 DIAGNOSIS — Z6830 Body mass index (BMI) 30.0-30.9, adult: Secondary | ICD-10-CM | POA: Diagnosis not present

## 2022-12-24 DIAGNOSIS — R0602 Shortness of breath: Secondary | ICD-10-CM | POA: Diagnosis not present

## 2023-01-04 ENCOUNTER — Encounter: Payer: Self-pay | Admitting: Nurse Practitioner

## 2023-01-04 ENCOUNTER — Ambulatory Visit: Payer: Medicaid Other | Attending: Internal Medicine | Admitting: Nurse Practitioner

## 2023-01-04 VITALS — BP 131/74 | HR 66 | Ht 61.0 in | Wt 156.6 lb

## 2023-01-04 DIAGNOSIS — Z1231 Encounter for screening mammogram for malignant neoplasm of breast: Secondary | ICD-10-CM

## 2023-01-04 DIAGNOSIS — I10 Essential (primary) hypertension: Secondary | ICD-10-CM

## 2023-01-04 DIAGNOSIS — K219 Gastro-esophageal reflux disease without esophagitis: Secondary | ICD-10-CM

## 2023-01-04 DIAGNOSIS — M47816 Spondylosis without myelopathy or radiculopathy, lumbar region: Secondary | ICD-10-CM | POA: Diagnosis not present

## 2023-01-04 DIAGNOSIS — G5603 Carpal tunnel syndrome, bilateral upper limbs: Secondary | ICD-10-CM | POA: Diagnosis not present

## 2023-01-04 MED ORDER — PANTOPRAZOLE SODIUM 40 MG PO TBEC
40.0000 mg | DELAYED_RELEASE_TABLET | Freq: Every day | ORAL | 1 refills | Status: DC
Start: 2023-01-04 — End: 2024-02-29

## 2023-01-04 MED ORDER — GABAPENTIN 800 MG PO TABS: 800.0000 mg | ORAL_TABLET | Freq: Four times a day (QID) | ORAL | 3 refills | Status: DC

## 2023-01-04 MED ORDER — CELECOXIB 200 MG PO CAPS: 200.0000 mg | ORAL_CAPSULE | Freq: Every day | ORAL | 0 refills | Status: DC

## 2023-01-04 MED ORDER — AMLODIPINE BESYLATE 10 MG PO TABS: 10.0000 mg | ORAL_TABLET | Freq: Every day | ORAL | 1 refills | Status: DC

## 2023-01-04 MED ORDER — LOVASTATIN 10 MG PO TABS: 10.0000 mg | ORAL_TABLET | Freq: Every day | ORAL | 1 refills | Status: DC

## 2023-01-04 NOTE — Progress Notes (Addendum)
Assessment & Plan:  Rachel Hudson was seen today for medical management of chronic issues.  Diagnoses and all orders for this visit:  Primary hypertension -     CMP14+EGFR -     amLODipine (NORVASC) 10 MG tablet; Take 1 tablet (10 mg total) by mouth daily. Continue all antihypertensives as prescribed.  Reminded to bring in blood pressure log for follow  up appointment.  RECOMMENDATIONS: DASH/Mediterranean Diets are healthier choices for HTN.    Breast cancer screening by mammogram -     MM 3D SCREENING MAMMOGRAM BILATERAL BREAST; Future  Spondylosis of lumbar region without myelopathy or radiculopathy -     gabapentin (NEURONTIN) 800 MG tablet; Take 1 tablet (800 mg total) by mouth every 6 (six) hours. -     celecoxib (CELEBREX) 200 MG capsule; Take 1 capsule (200 mg total) by mouth daily.  Bilateral carpal tunnel syndrome -     gabapentin (NEURONTIN) 800 MG tablet; Take 1 tablet (800 mg total) by mouth every 6 (six) hours. -     celecoxib (CELEBREX) 200 MG capsule; Take 1 capsule (200 mg total) by mouth daily.  GERD without esophagitis -     pantoprazole (PROTONIX) 40 MG tablet; Take 1-2 tablets (40-80 mg total) by mouth daily before breakfast. Take one tablet once to twice daily before a meal as needed for acid reflux. INSTRUCTIONS: Avoid GERD Triggers: acidic, spicy or fried foods, caffeine, coffee, sodas,  alcohol and chocolate.       Patient has been counseled on age-appropriate routine health concerns for screening and prevention. These are reviewed and up-to-date. Referrals have been placed accordingly. Immunizations are up-to-date or declined.    Subjective:   Chief Complaint  Patient presents with   Medical Management of Chronic Issues   HPI Rachel Hudson 62 y.o. female presents to office today for follow up to HTN. She was initially scheduled for PAP smear however she declines this today. She also did not stay to have her labs drawn today.   Patient has been  counseled on age-appropriate routine health concerns for screening and prevention. These are reviewed and up-to-date. Referrals have been placed accordingly. Immunizations are up-to-date or declined. MAMMOGRAM: UTD. Referral placed. Due in September PAP SMEAR: OVERDUE. Declines today COLON CANCER SCREENING: UTD  She has a past medical history of ADD, ADHD, Anxiety, Arthritis, Arthritis, Back pain, Bipolar 1 disorder, Cirrhosis of liver  (01/2014), Foot pain, bilateral, GERD, Gout, Hand pain, Hep C w/o coma, chronic (10/22/13), Hyperlipidemia, Hypertension, Joint pain, Liver problem, Osteoarthritis, Other fatigue, and Vitamin D deficiency.    HTN Blood pressure is well controlled today. She is taking amlodipine 10 mg daily as prescribed.  BP Readings from Last 3 Encounters:  01/04/23 131/74  11/15/22 134/76  10/07/22 120/74     The 10-year ASCVD risk score (Arnett DK, et al., 2019) is: 8.6%   Values used to calculate the score:     Age: 36 years     Sex: Female     Is Non-Hispanic African American: Yes     Diabetic: No     Tobacco smoker: No     Systolic Blood Pressure: 131 mmHg     Is BP treated: Yes     HDL Cholesterol: 51 mg/dL     Total Cholesterol: 216 mg/dL  She is currently not taking lovastatin. Has history of HEP C. Will defer to PCP regarding if needed    Review of Systems  Constitutional:  Negative for fever, malaise/fatigue and  weight loss.  HENT: Negative.  Negative for nosebleeds.   Eyes: Negative.  Negative for blurred vision, double vision and photophobia.  Respiratory: Negative.  Negative for cough and shortness of breath.   Cardiovascular: Negative.  Negative for chest pain, palpitations and leg swelling.  Gastrointestinal:  Positive for heartburn. Negative for nausea and vomiting.  Musculoskeletal:  Positive for back pain and joint pain. Negative for myalgias.  Neurological: Negative.  Negative for dizziness, focal weakness, seizures and headaches.   Psychiatric/Behavioral: Negative.  Negative for suicidal ideas.     Past Medical History:  Diagnosis Date   ADD (attention deficit disorder)    ADHD    Anxiety    Arthritis    Arthritis    Back pain    Bipolar 1 disorder (HCC)    Cirrhosis of liver (HCC) 01/2014   Stage 4   Foot pain, bilateral    GERD (gastroesophageal reflux disease)    Gout    Hand pain    Hep C w/o coma, chronic (HCC) As of 10/22/13   S/p treatment with Harvoni, achieved SVR   Hyperlipidemia    Hypertension    Joint pain    Liver problem    Osteoarthritis    Other fatigue    Shortness of breath    Shortness of breath on exertion    Vitamin D deficiency     Past Surgical History:  Procedure Laterality Date   BACK SURGERY     lumbar fusion   BIOPSY  09/12/2017   Procedure: BIOPSY;  Surgeon: Corbin Ade, MD;  [facility];  Service: Endoscopy;;  gastric   CARPAL TUNNEL RELEASE Right 11/13/2015   Procedure: RIGHT CARPAL TUNNEL RELEASE;  Surgeon: Betha Loa, MD;  Location: [facility];  Service: Orthopedics;  Laterality: Right;   CARPAL TUNNEL RELEASE Left 01/29/2016   Procedure: left CARPAL TUNNEL RELEASE;  Surgeon: Betha Loa, MD;  Location: [facility];  Service: Orthopedics;  Laterality: Left;   COLONOSCOPY N/A 07/08/2015   Surgeon: Corbin Ade, MD; internal hemorrhoids, colonic diverticulosis.  Repeat in 10 years.   ESOPHAGOGASTRODUODENOSCOPY N/A 07/08/2015   Surgeon: Corbin Ade, MD; erosive reflux esophagitis, hiatal hernia, gastric erosion and ulcer s/p biopsy (reactive gastropathy, negative H. pylori), no esophageal varices or portal hypertension.   ESOPHAGOGASTRODUODENOSCOPY (EGD) WITH PROPOFOL N/A 09/12/2017   Surgeon: Corbin Ade, MD; normal esophagus s/p dilation, portal hypertensive gastropathy, erosive gastropathy biopsied (negative for H. pylori), normal examined duodenum.   ESOPHAGOGASTRODUODENOSCOPY (EGD) WITH PROPOFOL N/A  10/25/2019   Surgeon: Corbin Ade, MD;  normal esophagus s/p dilation, mild erosive reflux esophagitis, portal hypertensive gastropathy, due for repeat in 2023.   ESOPHAGOGASTRODUODENOSCOPY (EGD) WITH PROPOFOL N/A 04/14/2022   Procedure: ESOPHAGOGASTRODUODENOSCOPY (EGD) WITH PROPOFOL;  Surgeon: Corbin Ade, MD;  [facility];  Service: Endoscopy;  Laterality: N/A;  11:15 am, pt knows to arrive at 7:30   FINGER ARTHROSCOPY WITH CARPOMETACARPEL [facility]) ARTHROPLASTY Right 04/24/2020   Procedure: RIGHT INDEX FINGER METACARPAL PHALANGEAL ARTHROPLASTY;  Surgeon: Betha Loa, MD;  Location: [facility];  Service: Orthopedics;  Laterality: Right;   LIGAMENT REPAIR Right 1980   Knee   MALONEY DILATION N/A 09/12/2017   Procedure: Elease Hashimoto DILATION;  Surgeon: Corbin Ade, MD;  [facility];  Service: Endoscopy;  Laterality: N/A;   MALONEY DILATION N/A 10/25/2019   Procedure: Elease Hashimoto DILATION;  Surgeon: Corbin Ade, MD;  [facility];  Service:  Endoscopy;  Laterality: N/A;   TRIGGER FINGER RELEASE Right 04/24/2020   Procedure: RIGHT LONG TRIGGER FINGER RELEASE TRIGGER FINGER/A-1 PULLEY;  Surgeon: Betha Loa, MD;  Location: [facility];  Service: Orthopedics;  Laterality: Right;    Family History  Adopted: Yes  Problem Relation Age of Onset   Colon cancer Neg Hx    Breast cancer Neg Hx     Social History Reviewed with no changes to be made today.   Outpatient Medications Prior to Visit  Medication Sig Dispense Refill   Brimonidine Tartrate (LUMIFY OP) Place 1 drop into both eyes daily.     Cholecalciferol (VITAMIN D-3 PO) Take 1 tablet by mouth daily.     colchicine 0.6 MG tablet TAKE 1 TABLET BY MOUTH TWICE DAILY . APPOINTMENT REQUIRED FOR FUTURE REFILLS 180 tablet 3   COLLAGEN PO Take 1 Scoop by mouth in the morning, at noon, in the evening, and at bedtime.     MILK THISTLE PO Take 175 mg by mouth daily.     Multiple  Vitamin (MULTIVITAMIN WITH MINERALS) TABS tablet Take 1 tablet by mouth daily. Centrum Multivitamin     Nutritional Supplements (LIVER DEFENSE PO) Take 2 tablets by mouth in the morning. Liverite Liver Aid     Omega-3 Fatty Acids (FISH OIL PO) Take 1 capsule by mouth in the morning.     amLODipine (NORVASC) 10 MG tablet Take 1 tablet by mouth once daily 90 tablet 0   celecoxib (CELEBREX) 200 MG capsule Take 1 capsule by mouth once daily 90 capsule 0   gabapentin (NEURONTIN) 800 MG tablet TAKE 1 TABLET BY MOUTH EVERY 6 HOURS 120 tablet 0   lovastatin (MEVACOR) 10 MG tablet Take 10 mg by mouth daily.     pantoprazole (PROTONIX) 40 MG tablet Take one tablet once to twice daily before a meal as needed for acid reflux. 60 tablet 11   No facility-administered medications prior to visit.    Allergies  Allergen Reactions   Penicillins     Facial swelling Has patient had a PCN reaction causing immediate rash, facial/tongue/throat swelling, SOB or lightheadedness with hypotension: Yes Has patient had a PCN reaction causing severe rash involving mucus membranes or skin necrosis: No Has patient had a PCN reaction that required hospitalization No Has patient had a PCN reaction occurring within the last 10 years: Yes If all of the above answers are "NO", then may proceed with Cephalosporin use.    Tylenol [Acetaminophen] Other (See Comments)    HX of Hep. C       Objective:    BP 131/74 (BP Location: Left Arm, Patient Position: Sitting, Cuff Size: Normal)   Pulse 66   Ht 5\' 1"  (1.549 m)   Wt 156 lb 9.6 oz (71 kg)   LMP 09/29/2016 (Approximate)   SpO2 96%   BMI 29.59 kg/m  Wt Readings from Last 3 Encounters:  01/04/23 156 lb 9.6 oz (71 kg)  11/15/22 158 lb 9.6 oz (71.9 kg)  04/23/22 165 lb (74.8 kg)    Physical Exam Vitals and nursing note reviewed.  Constitutional:      Appearance: She is well-developed.  HENT:     Head: Normocephalic and atraumatic.  Cardiovascular:     Rate and  Rhythm: Normal rate and regular rhythm.     Heart sounds: Normal heart sounds. No murmur heard.    No friction rub. No gallop.  Pulmonary:     Effort: Pulmonary effort is normal. No  tachypnea or respiratory distress.     Breath sounds: Normal breath sounds. No decreased breath sounds, wheezing, rhonchi or rales.  Chest:     Chest wall: No tenderness.  Abdominal:     General: Bowel sounds are normal.     Palpations: Abdomen is soft.  Musculoskeletal:        General: Normal range of motion.     Cervical back: Normal range of motion.  Skin:    General: Skin is warm and dry.  Neurological:     Mental Status: She is alert and oriented to person, place, and time.     Coordination: Coordination normal.  Psychiatric:        Behavior: Behavior normal. Behavior is cooperative.        Thought Content: Thought content normal.        Judgment: Judgment normal.          Patient has been counseled extensively about nutrition and exercise as well as the importance of adherence with medications and regular follow-up. The patient was given clear instructions to go to ER or return to medical center if symptoms don't improve, worsen or new problems develop. The patient verbalized understanding.   Follow-up: Return for pap smear with dr. Laural Benes in 3 months.   Claiborne Rigg, FNP-BC [facility] [place], [place] [phone]   01/04/2023, 4:12 PM

## 2023-01-17 ENCOUNTER — Other Ambulatory Visit: Payer: Self-pay | Admitting: Internal Medicine

## 2023-02-04 ENCOUNTER — Ambulatory Visit
Admission: RE | Admit: 2023-02-04 | Discharge: 2023-02-04 | Disposition: A | Discharge status: Home / Self Care | Payer: Medicaid Other | Source: Ambulatory Visit | Attending: Nurse Practitioner

## 2023-02-04 DIAGNOSIS — Z1231 Encounter for screening mammogram for malignant neoplasm of breast: Secondary | ICD-10-CM

## 2023-02-09 ENCOUNTER — Encounter (HOSPITAL_COMMUNITY): Payer: Self-pay | Admitting: *Deleted

## 2023-02-09 ENCOUNTER — Ambulatory Visit (INDEPENDENT_AMBULATORY_CARE_PROVIDER_SITE_OTHER): Payer: Medicaid Other

## 2023-02-09 ENCOUNTER — Ambulatory Visit (HOSPITAL_COMMUNITY)
Admission: EM | Admit: 2023-02-09 | Discharge: 2023-02-09 | Disposition: A | Discharge status: Home / Self Care | Payer: Medicaid Other | Attending: Emergency Medicine | Admitting: Emergency Medicine

## 2023-02-09 DIAGNOSIS — S20212A Contusion of left front wall of thorax, initial encounter: Secondary | ICD-10-CM | POA: Diagnosis not present

## 2023-02-09 DIAGNOSIS — R0781 Pleurodynia: Secondary | ICD-10-CM | POA: Diagnosis not present

## 2023-02-09 MED ORDER — NAPROXEN 375 MG PO TABS: 375.0000 mg | ORAL_TABLET | Freq: Two times a day (BID) | ORAL | 0 refills | Status: DC

## 2023-02-09 NOTE — Discharge Instructions (Addendum)
Fortunately your x-rays did not show any dislocated rib fractures.  The cartilage between your ribs takes a long time to heal.  Take the naproxen twice daily with food to help with pain and inflammation, you can also apply topical lidocaine patches that are available over-the-counter.  You should see improvement over the next week or 2, if not please follow-up with your primary care provider.  Do not take the naproxen with other NSAIDs like Celebrex or ibuprofen.  Seek immediate care if you develop fever, shortness of breath, or any new concerning symptoms.

## 2023-02-09 NOTE — ED Triage Notes (Signed)
Pt reports she was kick boxing and was hit to hard in the Lt side ,Ribs. Pt reports feels like something is moving around ,esp.under her Lt breast.

## 2023-02-09 NOTE — ED Provider Notes (Signed)
[facility]    CSN: [id number] Arrival date & time: 02/09/23  0800      History   Chief Complaint Chief Complaint  Patient presents with   Rib Injury    HPI Rachel Hudson is a 62 y.o. female.   Patient presents to clinic for left-sided anterior rib cage pain.  She was kickboxing 2 weeks ago when she got hit in her rib cage.  Initially had extensive bruising.  She has been trying ice and heat to the area without much relief.  She presents to clinic because she feels like her pain is getting worse.  She  "feels like something is moving around" in her ribcage.   Denies shortness of breath.   She takes Vitamin D. Denies hx of fractures.   The history is provided by the patient and medical records.    Past Medical History:  Diagnosis Date   ADD (attention deficit disorder)    ADHD    Anxiety    Arthritis    Arthritis    Back pain    Bipolar 1 disorder (HCC)    Cirrhosis of liver (HCC) 01/2014   Stage 4   Foot pain, bilateral    GERD (gastroesophageal reflux disease)    Gout    Hand pain    Hep C w/o coma, chronic (HCC) As of 10/22/13   S/p treatment with Harvoni, achieved SVR   Hyperlipidemia    Hypertension    Joint pain    Liver problem    Osteoarthritis    Other fatigue    Shortness of breath    Shortness of breath on exertion    Vitamin D deficiency     Patient Active Problem List   Diagnosis Date Noted   Portal hypertension (HCC) 04/23/2022   Marijuana user 03/30/2021   Bipolar 1 disorder (HCC) 01/28/2021   Insulin resistance 01/28/2021   Other hyperlipidemia 12/11/2020   Class 1 obesity with serious comorbidity and body mass index (BMI) of 32.0 to 32.9 in adult 12/03/2020   Influenza vaccine needed 02/26/2020   Bipolar affective disorder, currently manic, mild (HCC) 09/12/2017   Dysphagia 08/24/2017   Neuropathy of both feet 04/05/2017   Status post lumbar spinal fusion 10/22/2016   GERD (gastroesophageal reflux disease) 08/09/2016    Spondylolisthesis of lumbar region 07/29/2016   Sinus bradycardia 06/15/2016   Gingivitis 06/15/2016   ASCUS favor benign 11/13/2015   History of hepatitis C 10/13/2015   Bilateral carpal tunnel syndrome 09/16/2015   Cervical spondylosis without myelopathy 09/16/2015   Primary osteoarthritis of both hands 09/16/2015   Diverticulosis of colon without hemorrhage    Peptic ulcer disease    Spondylosis of lumbar region without myelopathy or radiculopathy 03/21/2015   Lumbago 11/18/2014   Carpal tunnel syndrome 11/18/2014   Hepatic cirrhosis (HCC) 08/06/2014   HTN (hypertension) 02/28/2014    Past Surgical History:  Procedure Laterality Date   BACK SURGERY     lumbar fusion   BIOPSY  09/12/2017   Procedure: BIOPSY;  Surgeon: Corbin Ade, MD;  [facility];  Service: Endoscopy;;  gastric   CARPAL TUNNEL RELEASE Right 11/13/2015   Procedure: RIGHT CARPAL TUNNEL RELEASE;  Surgeon: Betha Loa, MD;  Location: [facility];  Service: Orthopedics;  Laterality: Right;   CARPAL TUNNEL RELEASE Left 01/29/2016   Procedure: left CARPAL TUNNEL RELEASE;  Surgeon: Betha Loa, MD;  Location: [facility];  Service: Orthopedics;  Laterality: Left;   COLONOSCOPY  N/A 07/08/2015   Surgeon: Corbin Ade, MD; internal hemorrhoids, colonic diverticulosis.  Repeat in 10 years.   ESOPHAGOGASTRODUODENOSCOPY N/A 07/08/2015   Surgeon: Corbin Ade, MD; erosive reflux esophagitis, hiatal hernia, gastric erosion and ulcer s/p biopsy (reactive gastropathy, negative H. pylori), no esophageal varices or portal hypertension.   ESOPHAGOGASTRODUODENOSCOPY (EGD) WITH PROPOFOL N/A 09/12/2017   Surgeon: Corbin Ade, MD; normal esophagus s/p dilation, portal hypertensive gastropathy, erosive gastropathy biopsied (negative for H. pylori), normal examined duodenum.   ESOPHAGOGASTRODUODENOSCOPY (EGD) WITH PROPOFOL N/A 10/25/2019   Surgeon: Corbin Ade, MD;  normal esophagus  s/p dilation, mild erosive reflux esophagitis, portal hypertensive gastropathy, due for repeat in 2023.   ESOPHAGOGASTRODUODENOSCOPY (EGD) WITH PROPOFOL N/A 04/14/2022   Procedure: ESOPHAGOGASTRODUODENOSCOPY (EGD) WITH PROPOFOL;  Surgeon: Corbin Ade, MD;  [facility];  Service: Endoscopy;  Laterality: N/A;  11:15 am, pt knows to arrive at 7:30   FINGER ARTHROSCOPY WITH CARPOMETACARPEL [facility]) ARTHROPLASTY Right 04/24/2020   Procedure: RIGHT INDEX FINGER METACARPAL PHALANGEAL ARTHROPLASTY;  Surgeon: Betha Loa, MD;  Location: [facility];  Service: Orthopedics;  Laterality: Right;   LIGAMENT REPAIR Right 1980   Knee   MALONEY DILATION N/A 09/12/2017   Procedure: Elease Hashimoto DILATION;  Surgeon: Corbin Ade, MD;  [facility];  Service: Endoscopy;  Laterality: N/A;   MALONEY DILATION N/A 10/25/2019   Procedure: Elease Hashimoto DILATION;  Surgeon: Corbin Ade, MD;  [facility];  Service: Endoscopy;  Laterality: N/A;   TRIGGER FINGER RELEASE Right 04/24/2020   Procedure: RIGHT LONG TRIGGER FINGER RELEASE TRIGGER FINGER/A-1 PULLEY;  Surgeon: Betha Loa, MD;  Location: [facility];  Service: Orthopedics;  Laterality: Right;    OB History     Gravida  0   Para  0   Term  0   Preterm  0   AB  0   Living  0      SAB  0   IAB  0   Ectopic  0   Multiple  0   Live Births  0            Home Medications    Prior to Admission medications   Medication Sig Start Date End Date Taking? Authorizing Provider  amLODipine (NORVASC) 10 MG tablet Take 1 tablet (10 mg total) by mouth daily. 01/04/23  Yes Claiborne Rigg, NP  Brimonidine Tartrate (LUMIFY OP) Place 1 drop into both eyes daily.   Yes [provider]  celecoxib (CELEBREX) 200 MG capsule Take 1 capsule (200 mg total) by mouth daily. 01/04/23  Yes Claiborne Rigg, NP  Cholecalciferol (VITAMIN D-3 PO) Take 1 tablet by mouth daily.   Yes [provider]  colchicine 0.6 MG tablet Take 1 tablet (0.6 mg total) by mouth 2 (two) times daily. 01/17/23  Yes Marcine Matar, MD  COLLAGEN PO Take 1 Scoop by mouth in the morning, at noon, in the evening, and at bedtime.   Yes [provider]  gabapentin (NEURONTIN) 800 MG tablet Take 1 tablet (800 mg total) by mouth every 6 (six) hours. 01/04/23  Yes Claiborne Rigg, NP  MILK THISTLE PO Take 175 mg by mouth daily.   Yes [provider]  naproxen (NAPROSYN) 375 MG tablet Take 1 tablet (375 mg total) by mouth 2 (two) times daily. 02/09/23  Yes Rinaldo Ratel, [place], FNP  Nutritional Supplements (LIVER DEFENSE PO) Take 2 tablets by mouth in the morning.  Liverite Liver Aid   Yes [provider]  Omega-3 Fatty Acids (FISH OIL PO) Take 1 capsule by mouth in the morning.   Yes [provider]  pantoprazole (PROTONIX) 40 MG tablet Take 1-2 tablets (40-80 mg total) by mouth daily before breakfast. Take one tablet once to twice daily before a meal as needed for acid reflux. 01/04/23  Yes Claiborne Rigg, NP  Multiple Vitamin (MULTIVITAMIN WITH MINERALS) TABS tablet Take 1 tablet by mouth daily. Centrum Multivitamin    [provider]    Family History Family History  Adopted: Yes  Problem Relation Age of Onset   Colon cancer Neg Hx    Breast cancer Neg Hx     Social History Social History   Tobacco Use   Smoking status: Never   Smokeless tobacco: Never  Vaping Use   Vaping status: Never Used  Substance Use Topics   Alcohol use: Not Currently   Drug use: Yes    Frequency: 7.0 times per week    Types: Marijuana    Comment: daily for arthritis     Allergies   Penicillins and Tylenol [acetaminophen]   Review of Systems Review of Systems  Reason unable to perform ROS: per HPI.     Physical Exam Triage Vital Signs ED Triage Vitals  Encounter Vitals Group     BP 02/09/23 0826 117/78     Systolic BP Percentile --      Diastolic BP  Percentile --      Pulse Rate 02/09/23 0826 (!) 53     Resp 02/09/23 0826 18     Temp 02/09/23 0826 99 F (37.2 C)     Temp src --      SpO2 02/09/23 0826 95 %     Weight --      Height --      Head Circumference --      Peak Flow --      Pain Score 02/09/23 0824 10     Pain Loc --      Pain Education --      Exclude from Growth Chart --    No data found.  Updated Vital Signs BP 117/78   Pulse (!) 53   Temp 99 F (37.2 C)   Resp 18   LMP 09/29/2016 (Approximate)   SpO2 95%   Visual Acuity Right Eye Distance:   Left Eye Distance:   Bilateral Distance:    Right Eye Near:   Left Eye Near:    Bilateral Near:     Physical Exam Vitals and nursing note reviewed.  Constitutional:      Appearance: Normal appearance.  HENT:     Head: Normocephalic and atraumatic.     Right Ear: External ear normal.     Left Ear: External ear normal.     Nose: Nose normal.     Mouth/Throat:     Mouth: Mucous membranes are moist.  Eyes:     Conjunctiva/sclera: Conjunctivae normal.  Cardiovascular:     Rate and Rhythm: Normal rate.  Pulmonary:     Effort: Pulmonary effort is normal. No respiratory distress.  Chest:     Chest wall: Tenderness present. No deformity, swelling or crepitus.       Comments: L anterior rib cage is TTP around 6th, 7th and 8th ribs.  Musculoskeletal:        General: Tenderness and signs of injury present. No swelling or deformity. Normal range of motion.  Skin:  General: Skin is warm and dry.  Neurological:     General: No focal deficit present.     Mental Status: She is alert and oriented to person, place, and time.  Psychiatric:        Mood and Affect: Mood normal.        Behavior: Behavior normal. Behavior is cooperative.      UC Treatments / Results  Labs (all labs ordered are listed, but only abnormal results are displayed) Labs Reviewed - No data to display  EKG   Radiology DG Ribs Unilateral W/Chest Left  Result Date:  02/09/2023 CLINICAL DATA:  Hit in the left side at kickboxing.  Rib pain. EXAM: LEFT RIBS AND CHEST - 3+ VIEW COMPARISON:  None Available. FINDINGS: The cardiomediastinal silhouette is normal. There is no focal consolidation or pulmonary edema. There is no pleural effusion or pneumothorax. No displaced rib fracture or other acute osseous abnormality is identified. IMPRESSION: No displaced rib fracture identified. Electronically Signed   By: Lesia Hausen M.D.   On: 02/09/2023 09:07    Procedures Procedures (including critical care time)  Medications Ordered in UC Medications - No data to display  Initial Impression / Assessment and Plan / UC Course  I have reviewed the triage vital signs and the nursing notes.  Pertinent labs & imaging results that were available during my care of the patient were reviewed by me and considered in my medical decision making (see chart for details).  Vitals and triage reviewed, patient is hemodynamically stable.  Able to speak in full sentences, oxygenation upper 90s and no shortness of breath.  There is tenderness to the left anterior rib cage with scant bruising.  Imaging negative for any acute fractures or dislocations. Symptomatic management discussed for rib contusion. POC, f/u care and return precautions given, no questions at this time.      Final Clinical Impressions(s) / UC Diagnoses   Final diagnoses:  Contusion of rib on left side, initial encounter     Discharge Instructions      Fortunately your x-rays did not show any dislocated rib fractures.  The cartilage between your ribs takes a long time to heal.  Take the naproxen twice daily with food to help with pain and inflammation, you can also apply topical lidocaine patches that are available over-the-counter.  You should see improvement over the next week or 2, if not please follow-up with your primary care provider.  Do not take the naproxen with other NSAIDs like Celebrex or  ibuprofen.  Seek immediate care if you develop fever, shortness of breath, or any new concerning symptoms.      ED Prescriptions     Medication Sig Dispense Auth. Provider   naproxen (NAPROSYN) 375 MG tablet Take 1 tablet (375 mg total) by mouth 2 (two) times daily. 20 tablet Wynne Rozak, [place], [place]      PDMP not reviewed this encounter.   Rinaldo Ratel [place], [place] 02/09/23 [phone]

## 2023-03-16 ENCOUNTER — Telehealth (INDEPENDENT_AMBULATORY_CARE_PROVIDER_SITE_OTHER): Payer: Self-pay | Admitting: Internal Medicine

## 2023-03-16 NOTE — Telephone Encounter (Signed)
Called & spoke to the patient. Verified name & DOB. Informed that form is ready for pick-up. Patient expressed verbal understanding.

## 2023-03-16 NOTE — Telephone Encounter (Signed)
Copied from CRM [id number]. Topic: General - Other >> Mar 16, 2023  9:08 AM Macon Large wrote: Reason for CRM: Pt called for an update on the paperwork for handicap placard. Cb# [phone]

## 2023-04-05 ENCOUNTER — Encounter: Payer: Self-pay | Admitting: Gastroenterology

## 2023-04-08 ENCOUNTER — Ambulatory Visit: Payer: Medicaid Other | Attending: Internal Medicine | Admitting: Internal Medicine

## 2023-04-08 ENCOUNTER — Encounter: Payer: Self-pay | Admitting: Internal Medicine

## 2023-04-08 VITALS — BP 116/70 | HR 63 | Temp 98.4°F | Ht 61.0 in | Wt 159.0 lb

## 2023-04-08 DIAGNOSIS — E785 Hyperlipidemia, unspecified: Secondary | ICD-10-CM

## 2023-04-08 DIAGNOSIS — I10 Essential (primary) hypertension: Secondary | ICD-10-CM | POA: Diagnosis not present

## 2023-04-08 DIAGNOSIS — G8929 Other chronic pain: Secondary | ICD-10-CM

## 2023-04-08 DIAGNOSIS — M25512 Pain in left shoulder: Secondary | ICD-10-CM

## 2023-04-08 MED ORDER — KETOROLAC TROMETHAMINE 60 MG/2ML IM SOLN
60.0000 mg | Freq: Once | INTRAMUSCULAR | Status: AC
Start: 2023-04-08 — End: 2023-04-08
  Administered 2023-04-08: 60 mg via INTRAMUSCULAR

## 2023-04-08 NOTE — Progress Notes (Signed)
Patient ID: SKYA REDIC, female    DOB: [birth year]  MRN: [id number]  CC: Arm Pain (L arm pain X3 mo - worsening pain & unable to sleep, requesting x-ray for rotator cuff /Burning / sharp pain on L & R feet - worsened in last 6-8 mo/Declined pap/Already received flu vax)   Subjective: Rachel Hudson is a 62 y.o. female who presents for chronic ds management.  This was suppose to be her visit for PAP smear but pt declines it today Her concerns today include:  Patient with history of HTN, HL, hepatitis C (treated) with cirrhosis followed by GI, GERD and chronic lower back pain with hx surgery for spinal fixation of L4-L5, OA hands, bipolar affective ds, CTS hands.  Pain Management at [facility].    Discussed the use of AI scribe software for clinical note transcription with the patient, who gave verbal consent to proceed.  History of Present Illness   The patient presents with left shoulder pain that has been ongoing for several months, starting around July. She describes the pain as feeling like her bone is moving in and out and around, causing significant discomfort and limiting her daily activities. The pain is so severe that she cannot lay on it, drive, or sit for extended periods without experiencing stiffness and pain. She has been self-medicating with Aleve, taking almost five a day, but this only provides temporary relief.  The patient has a history of heavy lifting, including carrying logs that weigh approximately fifty pounds over the yrs. She suspects that this activity, which she stopped about two years ago, may have contributed to her current shoulder pain. She also attempted to lift weights, which seemed to exacerbate the pain.  She is currently on Celebrex and gabapentin.  She was on oxycodone through [facility] pain clinic but patient states they dismissed her after she would not give up smoking marijuana daily.  In addition to her shoulder pain, the patient is being treated for high  cholesterol with lovastatin, which she has been on for about two to three months from cardiologist through [facility].     HTN: Compliant with taking amlodipine 10 mg daily.    Patient Active Problem List   Diagnosis Date Noted   Portal hypertension (HCC) 04/23/2022   Marijuana user 03/30/2021   Bipolar 1 disorder (HCC) 01/28/2021   Insulin resistance 01/28/2021   Other hyperlipidemia 12/11/2020   Class 1 obesity with serious comorbidity and body mass index (BMI) of 32.0 to 32.9 in adult 12/03/2020   Influenza vaccine needed 02/26/2020   Bipolar affective disorder, currently manic, mild (HCC) 09/12/2017   Dysphagia 08/24/2017   Neuropathy of both feet 04/05/2017   Status post lumbar spinal fusion 10/22/2016   GERD (gastroesophageal reflux disease) 08/09/2016   Spondylolisthesis of lumbar region 07/29/2016   Sinus bradycardia 06/15/2016   Gingivitis 06/15/2016   ASCUS favor benign 11/13/2015   History of hepatitis C 10/13/2015   Bilateral carpal tunnel syndrome 09/16/2015   Cervical spondylosis without myelopathy 09/16/2015   Primary osteoarthritis of both hands 09/16/2015   Diverticulosis of colon without hemorrhage    Peptic ulcer disease    Spondylosis of lumbar region without myelopathy or radiculopathy 03/21/2015   Lumbago 11/18/2014   Carpal tunnel syndrome 11/18/2014   Hepatic cirrhosis (HCC) 08/06/2014   HTN (hypertension) 02/28/2014     Current Outpatient Medications on File Prior to Visit  Medication Sig Dispense Refill   lovastatin (MEVACOR) 10 MG tablet Take 10 mg by  mouth daily.     amLODipine (NORVASC) 10 MG tablet Take 1 tablet (10 mg total) by mouth daily. 90 tablet 1   Brimonidine Tartrate (LUMIFY OP) Place 1 drop into both eyes daily.     celecoxib (CELEBREX) 200 MG capsule Take 1 capsule (200 mg total) by mouth daily. 90 capsule 0   Cholecalciferol (VITAMIN D-3 PO) Take 1 tablet by mouth daily.     colchicine 0.6 MG tablet Take 1 tablet (0.6 mg  total) by mouth 2 (two) times daily. 180 tablet 1   COLLAGEN PO Take 1 Scoop by mouth in the morning, at noon, in the evening, and at bedtime.     gabapentin (NEURONTIN) 800 MG tablet Take 1 tablet (800 mg total) by mouth every 6 (six) hours. 120 tablet 3   MILK THISTLE PO Take 175 mg by mouth daily.     Multiple Vitamin (MULTIVITAMIN WITH MINERALS) TABS tablet Take 1 tablet by mouth daily. Centrum Multivitamin     Nutritional Supplements (LIVER DEFENSE PO) Take 2 tablets by mouth in the morning. Liverite Liver Aid     Omega-3 Fatty Acids (FISH OIL PO) Take 1 capsule by mouth in the morning.     pantoprazole (PROTONIX) 40 MG tablet Take 1-2 tablets (40-80 mg total) by mouth daily before breakfast. Take one tablet once to twice daily before a meal as needed for acid reflux. 180 tablet 1   No current facility-administered medications on file prior to visit.    Allergies  Allergen Reactions   Penicillins     Facial swelling Has patient had a PCN reaction causing immediate rash, facial/tongue/throat swelling, SOB or lightheadedness with hypotension: Yes Has patient had a PCN reaction causing severe rash involving mucus membranes or skin necrosis: No Has patient had a PCN reaction that required hospitalization No Has patient had a PCN reaction occurring within the last 10 years: Yes If all of the above answers are "NO", then may proceed with Cephalosporin use.    Tylenol [Acetaminophen] Other (See Comments)    HX of Hep. C    Social History   Socioeconomic History   Marital status: Single    Spouse name: Not on file   Number of children: Not on file   Years of education: Not on file   Highest education level: Not on file  Occupational History   Occupation: disabled  Tobacco Use   Smoking status: Never   Smokeless tobacco: Never  Vaping Use   Vaping status: Never Used  Substance and Sexual Activity   Alcohol use: Not Currently   Drug use: Yes    Frequency: 7.0 times per week     Types: Marijuana    Comment: daily for arthritis   Sexual activity: Not Currently    Partners: Male    Birth control/protection: None, Condom  Other Topics Concern   Not on file  Social History Narrative   Not on file   Social Determinants of Health   Financial Resource Strain: High Risk (04/08/2023)   Overall Financial Resource Strain (CARDIA)    Difficulty of Paying Living Expenses: Very hard  Food Insecurity: Food Insecurity Present (04/08/2023)   Hunger Vital Sign    Worried About Running Out of Food in the Last Year: Sometimes true    Ran Out of Food in the Last Year: Often true  Transportation Needs: No Transportation Needs (04/08/2023)   PRAPARE - Administrator, Civil Service (Medical): No    Lack of Transportation (  Non-Medical): No  Physical Activity: Sufficiently Active (04/08/2023)   Exercise Vital Sign    Days of Exercise per Week: 7 days    Minutes of Exercise per Session: 30 min  Stress: No Stress Concern Present (04/08/2023)   [employer] of Occupational Health - Occupational Stress Questionnaire    Feeling of Stress : Not at all  Social Connections: Socially Isolated (04/08/2023)   Social Connection and Isolation Panel [NHANES]    Frequency of Communication with Friends and Family: Never    Frequency of Social Gatherings with Friends and Family: Never    Attends Religious Services: More than 4 times per year    Active Member of [employer] or Organizations: No    Attends Banker Meetings: Never    Marital Status: Never married  Intimate Partner Violence: Not At Risk (04/08/2023)   Humiliation, Afraid, Rape, and Kick questionnaire    Fear of Current or Ex-Partner: No    Emotionally Abused: No    Physically Abused: No    Sexually Abused: No    Family History  Adopted: Yes  Problem Relation Age of Onset   Colon cancer Neg Hx    Breast cancer Neg Hx     Past Surgical History:  Procedure Laterality Date   BACK SURGERY      lumbar fusion   BIOPSY  09/12/2017   Procedure: BIOPSY;  Surgeon: Corbin Ade, MD;  [facility];  Service: Endoscopy;;  gastric   CARPAL TUNNEL RELEASE Right 11/13/2015   Procedure: RIGHT CARPAL TUNNEL RELEASE;  Surgeon: Betha Loa, MD;  Location: [facility];  Service: Orthopedics;  Laterality: Right;   CARPAL TUNNEL RELEASE Left 01/29/2016   Procedure: left CARPAL TUNNEL RELEASE;  Surgeon: Betha Loa, MD;  Location: [facility];  Service: Orthopedics;  Laterality: Left;   COLONOSCOPY N/A 07/08/2015   Surgeon: Corbin Ade, MD; internal hemorrhoids, colonic diverticulosis.  Repeat in 10 years.   ESOPHAGOGASTRODUODENOSCOPY N/A 07/08/2015   Surgeon: Corbin Ade, MD; erosive reflux esophagitis, hiatal hernia, gastric erosion and ulcer s/p biopsy (reactive gastropathy, negative H. pylori), no esophageal varices or portal hypertension.   ESOPHAGOGASTRODUODENOSCOPY (EGD) WITH PROPOFOL N/A 09/12/2017   Surgeon: Corbin Ade, MD; normal esophagus s/p dilation, portal hypertensive gastropathy, erosive gastropathy biopsied (negative for H. pylori), normal examined duodenum.   ESOPHAGOGASTRODUODENOSCOPY (EGD) WITH PROPOFOL N/A 10/25/2019   Surgeon: Corbin Ade, MD;  normal esophagus s/p dilation, mild erosive reflux esophagitis, portal hypertensive gastropathy, due for repeat in 2023.   ESOPHAGOGASTRODUODENOSCOPY (EGD) WITH PROPOFOL N/A 04/14/2022   Procedure: ESOPHAGOGASTRODUODENOSCOPY (EGD) WITH PROPOFOL;  Surgeon: Corbin Ade, MD;  [facility];  Service: Endoscopy;  Laterality: N/A;  11:15 am, pt knows to arrive at 7:30   FINGER ARTHROSCOPY WITH CARPOMETACARPEL [facility]) ARTHROPLASTY Right 04/24/2020   Procedure: RIGHT INDEX FINGER METACARPAL PHALANGEAL ARTHROPLASTY;  Surgeon: Betha Loa, MD;  Location: [facility];  Service: Orthopedics;  Laterality: Right;   LIGAMENT REPAIR Right 1980   Knee   MALONEY DILATION N/A  09/12/2017   Procedure: Elease Hashimoto DILATION;  Surgeon: Corbin Ade, MD;  [facility];  Service: Endoscopy;  Laterality: N/A;   MALONEY DILATION N/A 10/25/2019   Procedure: Elease Hashimoto DILATION;  Surgeon: Corbin Ade, MD;  [facility];  Service: Endoscopy;  Laterality: N/A;   TRIGGER FINGER RELEASE Right 04/24/2020   Procedure: RIGHT LONG TRIGGER FINGER RELEASE TRIGGER FINGER/A-1 PULLEY;  Surgeon: Betha Loa, MD;  Location: [facility];  Service: Orthopedics;  Laterality: Right;    ROS: Review of Systems Negative except as stated above  PHYSICAL EXAM: BP 116/70 (BP Location: Left Arm, Patient Position: Sitting, Cuff Size: Normal)   Pulse 63   Temp 98.4 F (36.9 C) (Oral)   Ht 5\' 1"  (1.549 m)   Wt 159 lb (72.1 kg)   LMP 09/29/2016 (Approximate)   SpO2 94%   BMI 30.04 kg/m   Wt Readings from Last 3 Encounters:  04/08/23 159 lb (72.1 kg)  01/04/23 156 lb 9.6 oz (71 kg)  11/15/22 158 lb 9.6 oz (71.9 kg)    Physical Exam  General appearance - alert, well appearing, and in no distress Mental status - normal mood, behavior, speech, dress, motor activity, and thought processes Musculoskeletal -left shoulder: Mild tenderness on palpation of the anterior joint line.  Moderate discomfort with attempted passive range of motion in all direction.  Drop arm test positive.      Latest Ref Rng & Units 04/09/2022    9:40 AM 03/22/2022    9:01 AM 11/11/2021    1:51 PM  CMP  Glucose 70 - 99 mg/dL 91  95  96   BUN 6 - 20 mg/dL 17  12  13    Creatinine 0.44 - 1.00 mg/dL 9.51  8.84  1.66   Sodium 135 - 145 mmol/L 141  140  142   Potassium 3.5 - 5.1 mmol/L 4.1  4.6  4.2   Chloride 98 - 111 mmol/L 105  102  103   CO2 22 - 32 mmol/L 26  25  26    Calcium 8.9 - 10.3 mg/dL 9.3  9.5  9.5   Total Protein 6.5 - 8.1 g/dL 7.7  7.2  7.3   Total Bilirubin 0.3 - 1.2 mg/dL 0.5  0.3  0.3   Alkaline Phos 38 - 126 U/L 65  71  77   AST 15 - 41 U/L 18  18  17    ALT 0  - 44 U/L 18  17  15     Lipid Panel     Component Value Date/Time   CHOL 216 (H) 10/15/2020 0925   TRIG 78 10/15/2020 0925   HDL 51 10/15/2020 0925   CHOLHDL 5.1 (H) 10/26/2019 1022   CHOLHDL 2.8 09/28/2013 0904   VLDL 25 09/28/2013 0904   LDLCALC 151 (H) 10/15/2020 0925    CBC    Component Value Date/Time   WBC 7.2 04/09/2022 0940   RBC 4.58 04/09/2022 0940   HGB 13.6 04/09/2022 0940   HGB 13.9 03/22/2022 0901   HCT 41.1 04/09/2022 0940   HCT 42.1 03/22/2022 0901   PLT 200 04/09/2022 0940   PLT 200 03/22/2022 0901   MCV 89.7 04/09/2022 0940   MCV 88 03/22/2022 0901   MCH 29.7 04/09/2022 0940   MCHC 33.1 04/09/2022 0940   RDW 14.6 04/09/2022 0940   RDW 13.7 03/22/2022 0901   LYMPHSABS 3.5 (H) 03/22/2022 0901   MONOABS 0.5 03/02/2018 0904   EOSABS 0.2 03/22/2022 0901   BASOSABS 0.1 03/22/2022 0901    ASSESSMENT AND PLAN: 1. Chronic left shoulder pain Suspect rotator cuff disease.  Will start with plain x-ray.  Referral submitted to orthopedics.  Given Toradol injection today.  Advised to hold on taking Celebrex for 24 hours post Toradol injection. - AMB referral to orthopedics - DG Shoulder Left; Future - ketorolac (TORADOL) injection 60 mg  2. Essential hypertension At goal.  Continue Norvasc. -  Comprehensive metabolic panel - CBC  3. Hyperlipidemia, unspecified hyperlipidemia type I have updated her med list with lovastatin.    Patient was given the opportunity to ask questions.  Patient verbalized understanding of the plan and was able to repeat key elements of the plan.   This documentation was completed using Paediatric nurse.  Any transcriptional errors are unintentional.  Orders Placed This Encounter  Procedures   DG Shoulder Left   Comprehensive metabolic panel   CBC   AMB referral to orthopedics     Requested Prescriptions    No prescriptions requested or ordered in this encounter    Return in about 2 months (around  06/08/2023).  Jonah Blue, MD, FACP

## 2023-04-08 NOTE — Patient Instructions (Signed)
We have given a Toradol shot today.  Hold off on taking the Celebrex for a full 24 hours after receiving Toradol shot.  We have referred you to orthopedic specialist for further evaluation and management.  Please go to [facility] imaging at [address] to get x-ray done on the left shoulder.  Avoid any lifting, pushing or pulling.

## 2023-04-09 LAB — CBC
Hematocrit: 42.1 % (ref 34.0–46.6)
Hemoglobin: 14.3 g/dL (ref 11.1–15.9)
MCH: 30.6 pg (ref 26.6–33.0)
MCHC: 34 g/dL (ref 31.5–35.7)
MCV: 90 fL (ref 79–97)
Platelets: 194 10*3/uL (ref 150–450)
RBC: 4.67 x10E6/uL (ref 3.77–5.28)
RDW: 13 % (ref 11.7–15.4)
WBC: 9 10*3/uL (ref 3.4–10.8)

## 2023-04-09 LAB — COMPREHENSIVE METABOLIC PANEL
ALT: 14 [IU]/L (ref 0–32)
AST: 18 [IU]/L (ref 0–40)
Albumin: 4.8 g/dL (ref 3.9–4.9)
Alkaline Phosphatase: 69 [IU]/L (ref 44–121)
BUN/Creatinine Ratio: 20 (ref 12–28)
BUN: 14 mg/dL (ref 8–27)
Bilirubin Total: <0.2 mg/dL (ref 0.0–1.2)
CO2: 22 mmol/L (ref 20–29)
Calcium: 9.7 mg/dL (ref 8.7–10.3)
Chloride: 103 mmol/L (ref 96–106)
Creatinine, Ser: 0.71 mg/dL (ref 0.57–1.00)
Globulin, Total: 2.6 g/dL (ref 1.5–4.5)
Glucose: 85 mg/dL (ref 70–99)
Potassium: 4.5 mmol/L (ref 3.5–5.2)
Sodium: 141 mmol/L (ref 134–144)
Total Protein: 7.4 g/dL (ref 6.0–8.5)
eGFR: 97 mL/min/{1.73_m2} (ref >59)

## 2023-04-11 ENCOUNTER — Ambulatory Visit
Admission: RE | Admit: 2023-04-11 | Discharge: 2023-04-11 | Disposition: A | Discharge status: Home / Self Care | Payer: Medicaid Other | Source: Ambulatory Visit | Attending: Internal Medicine | Admitting: Internal Medicine

## 2023-04-11 DIAGNOSIS — M25512 Pain in left shoulder: Secondary | ICD-10-CM | POA: Diagnosis not present

## 2023-04-11 DIAGNOSIS — G8929 Other chronic pain: Secondary | ICD-10-CM

## 2023-04-26 ENCOUNTER — Ambulatory Visit (INDEPENDENT_AMBULATORY_CARE_PROVIDER_SITE_OTHER): Payer: Medicaid Other | Admitting: Orthopaedic Surgery

## 2023-04-26 DIAGNOSIS — M25512 Pain in left shoulder: Secondary | ICD-10-CM | POA: Diagnosis not present

## 2023-04-26 DIAGNOSIS — G8929 Other chronic pain: Secondary | ICD-10-CM | POA: Diagnosis not present

## 2023-04-26 MED ORDER — PREDNISONE 10 MG (21) PO TBPK
ORAL_TABLET | ORAL | 3 refills | Status: DC
Start: 2023-04-26 — End: 2023-07-05

## 2023-04-26 NOTE — Progress Notes (Signed)
Office Visit Note   Patient: Rachel Hudson           Date of Birth: 1961/04/19           MRN: [id number] Visit Date: 04/26/2023              Requested by: Marcine Matar, MD [address] [place] [address] [place],  [place] [address] PCP: Marcine Matar, MD   Assessment & Plan: Visit Diagnoses:  1. Chronic left shoulder pain     Plan: Impression is 62 year old female with chronic diffuse left shoulder pain.  Could be osteoarthritis flare.  Difficult to say but I doubt she has a structural abnormality.  We will try a prednisone Dosepak today.  If this does not improve could consider a glenohumeral injection as she does show mild amount of glenohumeral joint space narrowing.  Follow-Up Instructions: No follow-ups on file.   Orders:  No orders of the defined types were placed in this encounter.  Meds ordered this encounter  Medications   predniSONE (STERAPRED UNI-PAK 21 TAB) 10 MG (21) TBPK tablet    Sig: Take as directed    Dispense:  21 tablet    Refill:  3      Procedures: No procedures performed   Clinical Data: No additional findings.   Subjective: Chief Complaint  Patient presents with   Left Shoulder - Pain    HPI Rachel Hudson is a 61 year old female who comes in for evaluation of chronic left shoulder pain.  She feels pain throughout the shoulder with certain movements hurts a lot.  She states that even hurts when she walks.  She has trouble doing ADLs.  Has been getting worse over the last month.  Feels popping in the shoulder. Review of Systems  Constitutional: Negative.   HENT: Negative.    Eyes: Negative.   Respiratory: Negative.    Cardiovascular: Negative.   Endocrine: Negative.   Musculoskeletal: Negative.   Neurological: Negative.   Hematological: Negative.   Psychiatric/Behavioral: Negative.    All other systems reviewed and are negative.    Objective: Vital Signs: LMP 09/29/2016 (Approximate)   Physical Exam Vitals and nursing note  reviewed.  Constitutional:      Appearance: She is well-developed.  HENT:     Head: Atraumatic.     Nose: Nose normal.  Eyes:     Extraocular Movements: Extraocular movements intact.  Cardiovascular:     Pulses: Normal pulses.  Pulmonary:     Effort: Pulmonary effort is normal.  Abdominal:     Palpations: Abdomen is soft.  Musculoskeletal:     Cervical back: Neck supple.  Skin:    General: Skin is warm.     Capillary Refill: Capillary refill takes less than 2 seconds.  Neurological:     Mental Status: She is alert. Mental status is at baseline.  Psychiatric:        Behavior: Behavior normal.        Thought Content: Thought content normal.        Judgment: Judgment normal.     Ortho Exam Exam of the left shoulder shows significant guarding to range of motion.  I do not detect any focal motor or sensory deficits.  Negative Spurling sign. Specialty Comments:  No specialty comments available.  Imaging: No results found.   PMFS History: Patient Active Problem List   Diagnosis Date Noted   Portal hypertension (HCC) 04/23/2022   Marijuana user 03/30/2021   Bipolar 1 disorder (HCC) 01/28/2021  Insulin resistance 01/28/2021   Other hyperlipidemia 12/11/2020   Class 1 obesity with serious comorbidity and body mass index (BMI) of 32.0 to 32.9 in adult 12/03/2020   Influenza vaccine needed 02/26/2020   Bipolar affective disorder, currently manic, mild (HCC) 09/12/2017   Dysphagia 08/24/2017   Neuropathy of both feet 04/05/2017   Status post lumbar spinal fusion 10/22/2016   GERD (gastroesophageal reflux disease) 08/09/2016   Spondylolisthesis of lumbar region 07/29/2016   Sinus bradycardia 06/15/2016   Gingivitis 06/15/2016   ASCUS favor benign 11/13/2015   History of hepatitis C 10/13/2015   Bilateral carpal tunnel syndrome 09/16/2015   Cervical spondylosis without myelopathy 09/16/2015   Primary osteoarthritis of both hands 09/16/2015   Diverticulosis of colon  without hemorrhage    Peptic ulcer disease    Spondylosis of lumbar region without myelopathy or radiculopathy 03/21/2015   Lumbago 11/18/2014   Carpal tunnel syndrome 11/18/2014   Hepatic cirrhosis (HCC) 08/06/2014   HTN (hypertension) 02/28/2014   Past Medical History:  Diagnosis Date   ADD (attention deficit disorder)    ADHD    Anxiety    Arthritis    Arthritis    Back pain    Bipolar 1 disorder (HCC)    Cirrhosis of liver (HCC) 01/2014   Stage 4   Foot pain, bilateral    GERD (gastroesophageal reflux disease)    Gout    Hand pain    Hep C w/o coma, chronic (HCC) As of 10/22/13   S/p treatment with Harvoni, achieved SVR   Hyperlipidemia    Hypertension    Joint pain    Liver problem    Osteoarthritis    Other fatigue    Shortness of breath    Shortness of breath on exertion    Vitamin D deficiency     Family History  Adopted: Yes  Problem Relation Age of Onset   Colon cancer Neg Hx    Breast cancer Neg Hx     Past Surgical History:  Procedure Laterality Date   BACK SURGERY     lumbar fusion   BIOPSY  09/12/2017   Procedure: BIOPSY;  Surgeon: Corbin Ade, MD;  [facility];  Service: Endoscopy;;  gastric   CARPAL TUNNEL RELEASE Right 11/13/2015   Procedure: RIGHT CARPAL TUNNEL RELEASE;  Surgeon: Betha Loa, MD;  Location: [facility];  Service: Orthopedics;  Laterality: Right;   CARPAL TUNNEL RELEASE Left 01/29/2016   Procedure: left CARPAL TUNNEL RELEASE;  Surgeon: Betha Loa, MD;  [facility];  Service: Orthopedics;  Laterality: Left;   COLONOSCOPY N/A 07/08/2015   Surgeon: Corbin Ade, MD; internal hemorrhoids, colonic diverticulosis.  Repeat in 10 years.   ESOPHAGOGASTRODUODENOSCOPY N/A 07/08/2015   Surgeon: Corbin Ade, MD; erosive reflux esophagitis, hiatal hernia, gastric erosion and ulcer s/p biopsy (reactive gastropathy, negative H. pylori), no esophageal varices or portal hypertension.    ESOPHAGOGASTRODUODENOSCOPY (EGD) WITH PROPOFOL N/A 09/12/2017   Surgeon: Corbin Ade, MD; normal esophagus s/p dilation, portal hypertensive gastropathy, erosive gastropathy biopsied (negative for H. pylori), normal examined duodenum.   ESOPHAGOGASTRODUODENOSCOPY (EGD) WITH PROPOFOL N/A 10/25/2019   Surgeon: Corbin Ade, MD;  normal esophagus s/p dilation, mild erosive reflux esophagitis, portal hypertensive gastropathy, due for repeat in 2023.   ESOPHAGOGASTRODUODENOSCOPY (EGD) WITH PROPOFOL N/A 04/14/2022   Procedure: ESOPHAGOGASTRODUODENOSCOPY (EGD) WITH PROPOFOL;  Surgeon: Corbin Ade, MD;  [facility];  Service: Endoscopy;  Laterality: N/A;  11:15 am,  pt knows to arrive at 7:30   FINGER ARTHROSCOPY WITH CARPOMETACARPEL [facility]) ARTHROPLASTY Right 04/24/2020   Procedure: RIGHT INDEX FINGER METACARPAL PHALANGEAL ARTHROPLASTY;  Surgeon: Betha Loa, MD;  Location: [facility];  Service: Orthopedics;  Laterality: Right;   LIGAMENT REPAIR Right 1980   Knee   MALONEY DILATION N/A 09/12/2017   Procedure: Elease Hashimoto DILATION;  Surgeon: Corbin Ade, MD;  [facility];  Service: Endoscopy;  Laterality: N/A;   MALONEY DILATION N/A 10/25/2019   Procedure: Elease Hashimoto DILATION;  Surgeon: Corbin Ade, MD;  [facility];  Service: Endoscopy;  Laterality: N/A;   TRIGGER FINGER RELEASE Right 04/24/2020   Procedure: RIGHT LONG TRIGGER FINGER RELEASE TRIGGER FINGER/A-1 PULLEY;  Surgeon: Betha Loa, MD;  Location: [facility];  Service: Orthopedics;  Laterality: Right;   Social History   Occupational History   Occupation: disabled  Tobacco Use   Smoking status: Never   Smokeless tobacco: Never  Vaping Use   Vaping status: Never Used  Substance and Sexual Activity   Alcohol use: Not Currently   Drug use: Yes    Frequency: 7.0 times per week    Types: Marijuana    Comment: daily for arthritis   Sexual activity: Not Currently     Partners: Male    Birth control/protection: None, Condom

## 2023-05-02 ENCOUNTER — Encounter: Payer: Self-pay | Admitting: Gastroenterology

## 2023-05-20 ENCOUNTER — Other Ambulatory Visit: Payer: Self-pay | Admitting: Nurse Practitioner

## 2023-05-20 DIAGNOSIS — G5603 Carpal tunnel syndrome, bilateral upper limbs: Secondary | ICD-10-CM

## 2023-05-20 DIAGNOSIS — M47816 Spondylosis without myelopathy or radiculopathy, lumbar region: Secondary | ICD-10-CM

## 2023-05-31 NOTE — Progress Notes (Signed)
 GI Office Note    Referring Provider: Vicci Barnie NOVAK, MD Primary Care Physician:  Vicci Barnie NOVAK, MD  Primary Gastroenterologist: Ozell Hollingshead, MD   Chief Complaint   Chief Complaint  Patient presents with   Follow-up    Having some upper right quadrant pain and states that it feels like her liver is enlarged.    History of Present Illness   Rachel Hudson is a 63 y.o. female presenting today for six month follow up. Last seen in 10/2022.  History of HCV related cirrhosis, treated successfully. H/o GERD.    Last labs and u/s done in 11/2022, due for update and hepatoma screening.   Today: For four weeks feels pain in RUQ. Feels like ribs pushing on the liver like enlarged liver. Not sure if related to lifting heavy cases. No postprandial component. Appetite is good. No n/v. Otherwise feels good. Appetite is good. No heartburn since on pantoprazole . No dysphagia. States no etoh since 03/2023. History of occasionally drinking 2 ounces of wine earlier in 2024 but advised after last ov not to consume any. She has remote history of heavier use.  Since her last ov, trying to state fit. Typically rides 15 miles daily on bicycle, has not seen 01/2023 due to colder weather. Walk about 1.5 miles daily but not much with extreme cold. She has a stationary bike but does not use it.   EGD 03/2022: -normal esophagus -small hh -future screening EGD guided by kPA >20 and/or platelet count less than 150,000   Colonoscopy February 2017: -Internal hemorrhoids -Colonic diverticulosis -Next colonoscopy 2027.   Medications   Current Outpatient Medications  Medication Sig Dispense Refill   amLODipine  (NORVASC ) 10 MG tablet Take 1 tablet (10 mg total) by mouth daily. 90 tablet 1   Brimonidine Tartrate (LUMIFY OP) Place 1 drop into both eyes daily.     celecoxib  (CELEBREX ) 200 MG capsule Take 1 capsule (200 mg total) by mouth daily. 90 capsule 0   Cholecalciferol (VITAMIN D-3 PO)  Take 1 tablet by mouth daily.     colchicine  0.6 MG tablet Take 1 tablet (0.6 mg total) by mouth 2 (two) times daily. 180 tablet 1   COLLAGEN PO Take 1 Scoop by mouth in the morning, at noon, in the evening, and at bedtime.     gabapentin  (NEURONTIN ) 800 MG tablet TAKE 1 TABLET BY MOUTH EVERY 6 HOURS 120 tablet 5   lovastatin  (MEVACOR ) 10 MG tablet Take 10 mg by mouth daily.     MILK THISTLE PO Take 175 mg by mouth daily.     Multiple Vitamin (MULTIVITAMIN WITH MINERALS) TABS tablet Take 1 tablet by mouth daily. Centrum Multivitamin     Nutritional Supplements (LIVER DEFENSE PO) Take 2 tablets by mouth in the morning. Liverite Liver Aid     Omega-3 Fatty Acids (FISH OIL PO) Take 1 capsule by mouth in the morning.     pantoprazole  (PROTONIX ) 40 MG tablet Take 1-2 tablets (40-80 mg total) by mouth daily before breakfast. Take one tablet once to twice daily before a meal as needed for acid reflux. 180 tablet 1   predniSONE  (STERAPRED UNI-PAK 21 TAB) 10 MG (21) TBPK tablet Take as directed 21 tablet 3   No current facility-administered medications for this visit.    Allergies   Allergies as of 06/01/2023 - Review Complete 06/01/2023  Allergen Reaction Noted   Penicillins  03/28/2011   Tylenol  [acetaminophen ] Other (See Comments) 07/14/2014  Review of Systems   General: Negative for anorexia, weight loss, fever, chills, fatigue, weakness. ENT: Negative for hoarseness, difficulty swallowing , nasal congestion. CV: Negative for chest pain, angina, palpitations, dyspnea on exertion, peripheral edema.  Respiratory: Negative for dyspnea at rest, dyspnea on exertion, cough, sputum, wheezing.  GI: See history of present illness. GU:  Negative for dysuria, hematuria, urinary incontinence, urinary frequency, nocturnal urination.  Endo: Negative for unusual weight change.     Physical Exam   BP 130/68 (BP Location: Right Arm, Patient Position: Sitting, Cuff Size: Normal)   Pulse (!) 59    Temp 97.9 F (36.6 C) (Oral)   Ht 5' 1 (1.549 m)   Wt 162 lb 6.4 oz (73.7 kg)   LMP 09/29/2016 (Approximate)   SpO2 96%   BMI 30.69 kg/m    General: Well-nourished, well-developed in no acute distress.  Eyes: No icterus. Mouth: Oropharyngeal mucosa moist and pink  Abdomen: Bowel sounds are normal, nontender, nondistended, no hepatosplenomegaly or masses,  no abdominal bruits or hernia , no rebound or guarding.  Rectal: not performed  Extremities: No lower extremity edema. No clubbing or deformities. Neuro: Alert and oriented x 4   Skin: Warm and dry, no jaundice.   Psych: Alert and cooperative, normal mood and affect.  Labs   Lab Results  Component Value Date   ALT 14 04/08/2023   AST 18 04/08/2023   ALKPHOS 69 04/08/2023   BILITOT <0.2 04/08/2023   Lab Results  Component Value Date   WBC 9.0 04/08/2023   HGB 14.3 04/08/2023   HCT 42.1 04/08/2023   MCV 90 04/08/2023   PLT 194 04/08/2023    Imaging Studies   No results found.  Assessment/Plan:   Cirrhosis: prior HCV, successfully treated. Well compensated -due for labs and hepatoma screening -encouraged continued daily exercise -discouraged all etoh use, fortunately she has eliminated etoh, previously drinking occasional wine, two ounces but also cut this out -return ov in six months  RUQ pain: -labs -RUQ u/s  GERD: doing well -continue pantoprazole  once to twice daily   Sonny RAMAN. Ezzard, MHS, PA-C [facility]

## 2023-06-01 ENCOUNTER — Encounter: Payer: Self-pay | Admitting: Gastroenterology

## 2023-06-01 ENCOUNTER — Ambulatory Visit (INDEPENDENT_AMBULATORY_CARE_PROVIDER_SITE_OTHER): Payer: Medicaid Other | Admitting: Gastroenterology

## 2023-06-01 VITALS — BP 130/68 | HR 59 | Temp 97.9°F | Ht 61.0 in | Wt 162.4 lb

## 2023-06-01 DIAGNOSIS — R1011 Right upper quadrant pain: Secondary | ICD-10-CM

## 2023-06-01 DIAGNOSIS — K7460 Unspecified cirrhosis of liver: Secondary | ICD-10-CM

## 2023-06-01 DIAGNOSIS — F1091 Alcohol use, unspecified, in remission: Secondary | ICD-10-CM | POA: Diagnosis not present

## 2023-06-01 DIAGNOSIS — K219 Gastro-esophageal reflux disease without esophagitis: Secondary | ICD-10-CM

## 2023-06-01 NOTE — Patient Instructions (Signed)
 Please complete labs and ultrasound. We will be in touch with results as available. Call if your abdominal pain worsens.  Continue pantoprazole once to twice daily as needed for acid reflux. Return office visit in six months.

## 2023-06-08 ENCOUNTER — Ambulatory Visit (HOSPITAL_COMMUNITY)
Admission: RE | Admit: 2023-06-08 | Discharge: 2023-06-08 | Disposition: A | Discharge status: Home / Self Care | Payer: Medicaid Other | Source: Ambulatory Visit | Attending: Gastroenterology | Admitting: Gastroenterology

## 2023-06-08 DIAGNOSIS — R1011 Right upper quadrant pain: Secondary | ICD-10-CM | POA: Diagnosis not present

## 2023-06-08 DIAGNOSIS — K7460 Unspecified cirrhosis of liver: Secondary | ICD-10-CM | POA: Insufficient documentation

## 2023-06-08 DIAGNOSIS — K7689 Other specified diseases of liver: Secondary | ICD-10-CM | POA: Diagnosis not present

## 2023-06-08 DIAGNOSIS — K219 Gastro-esophageal reflux disease without esophagitis: Secondary | ICD-10-CM | POA: Diagnosis present

## 2023-06-10 DIAGNOSIS — K219 Gastro-esophageal reflux disease without esophagitis: Secondary | ICD-10-CM | POA: Diagnosis not present

## 2023-06-10 DIAGNOSIS — K7460 Unspecified cirrhosis of liver: Secondary | ICD-10-CM | POA: Diagnosis not present

## 2023-06-11 LAB — CBC WITH DIFFERENTIAL/PLATELET
Basophils Absolute: 0.1 10*3/uL (ref 0.0–0.2)
Basos: 1 %
EOS (ABSOLUTE): 0.2 10*3/uL (ref 0.0–0.4)
Eos: 3 %
Hematocrit: 41.7 % (ref 34.0–46.6)
Hemoglobin: 13.8 g/dL (ref 11.1–15.9)
Immature Grans (Abs): 0 10*3/uL (ref 0.0–0.1)
Immature Granulocytes: 0 %
Lymphocytes Absolute: 3.1 10*3/uL (ref 0.7–3.1)
Lymphs: 37 %
MCH: 29.5 pg (ref 26.6–33.0)
MCHC: 33.1 g/dL (ref 31.5–35.7)
MCV: 89 fL (ref 79–97)
Monocytes Absolute: 0.5 10*3/uL (ref 0.1–0.9)
Monocytes: 6 %
Neutrophils Absolute: 4.5 10*3/uL (ref 1.4–7.0)
Neutrophils: 53 %
Platelets: 172 10*3/uL (ref 150–450)
RBC: 4.68 x10E6/uL (ref 3.77–5.28)
RDW: 13 % (ref 11.7–15.4)
WBC: 8.4 10*3/uL (ref 3.4–10.8)

## 2023-06-11 LAB — COMPREHENSIVE METABOLIC PANEL
ALT: 18 [IU]/L (ref 0–32)
AST: 19 [IU]/L (ref 0–40)
Albumin: 4.6 g/dL (ref 3.9–4.9)
Alkaline Phosphatase: 70 [IU]/L (ref 44–121)
BUN/Creatinine Ratio: 19 (ref 12–28)
BUN: 14 mg/dL (ref 8–27)
Bilirubin Total: 0.4 mg/dL (ref 0.0–1.2)
CO2: 26 mmol/L (ref 20–29)
Calcium: 9.5 mg/dL (ref 8.7–10.3)
Chloride: 102 mmol/L (ref 96–106)
Creatinine, Ser: 0.73 mg/dL (ref 0.57–1.00)
Globulin, Total: 2.6 g/dL (ref 1.5–4.5)
Glucose: 99 mg/dL (ref 70–99)
Potassium: 4.1 mmol/L (ref 3.5–5.2)
Sodium: 143 mmol/L (ref 134–144)
Total Protein: 7.2 g/dL (ref 6.0–8.5)
eGFR: 94 mL/min/{1.73_m2} (ref >59)

## 2023-06-11 LAB — PROTIME-INR
INR: 1 (ref 0.9–1.2)
Prothrombin Time: 11.4 s (ref 9.1–12.0)

## 2023-06-11 LAB — AFP TUMOR MARKER: AFP, Serum, Tumor Marker: 2.9 ng/mL (ref 0.0–9.2)

## 2023-06-16 ENCOUNTER — Other Ambulatory Visit: Payer: Self-pay | Admitting: Nurse Practitioner

## 2023-06-16 DIAGNOSIS — M47816 Spondylosis without myelopathy or radiculopathy, lumbar region: Secondary | ICD-10-CM

## 2023-06-16 DIAGNOSIS — G5603 Carpal tunnel syndrome, bilateral upper limbs: Secondary | ICD-10-CM

## 2023-06-16 NOTE — Telephone Encounter (Signed)
Requested Prescriptions  Pending Prescriptions Disp Refills   celecoxib (CELEBREX) 200 MG capsule [Pharmacy Med Name: Celecoxib 200 MG Oral Capsule] 90 capsule 0    Sig: Take 1 capsule by mouth once daily     Analgesics:  COX2 Inhibitors Failed - 06/16/2023  2:18 PM      Failed - Manual Review: Labs are only required if the patient has taken medication for more than 8 weeks.      Passed - HGB in normal range and within 360 days    Hemoglobin  Date Value Ref Range Status  06/10/2023 13.8 11.1 - 15.9 g/dL Final         Passed - Cr in normal range and within 360 days    Creat  Date Value Ref Range Status  06/15/2016 0.76 0.50 - 1.05 mg/dL Final    Comment:      For patients > or = 62 years of age: The upper reference limit for Creatinine is approximately 13% higher for people identified as African-American.      Creatinine, Ser  Date Value Ref Range Status  06/10/2023 0.73 0.57 - 1.00 mg/dL Final         Passed - HCT in normal range and within 360 days    Hematocrit  Date Value Ref Range Status  06/10/2023 41.7 34.0 - 46.6 % Final         Passed - AST in normal range and within 360 days    AST  Date Value Ref Range Status  06/10/2023 19 0 - 40 IU/L Final         Passed - ALT in normal range and within 360 days    ALT  Date Value Ref Range Status  06/10/2023 18 0 - 32 IU/L Final         Passed - eGFR is 30 or above and within 360 days    GFR, Est African American  Date Value Ref Range Status  06/15/2016 >89 >=60 mL/min Final   GFR calc Af Amer  Date Value Ref Range Status  08/14/2019 >60 >60 mL/min Final   GFR, Est Non African American  Date Value Ref Range Status  06/15/2016 89 >=60 mL/min Final   GFR, Estimated  Date Value Ref Range Status  04/09/2022 >60 >60 mL/min Final    Comment:    (NOTE) Calculated using the CKD-EPI Creatinine Equation (2021)    eGFR  Date Value Ref Range Status  06/10/2023 94 >59 mL/min/1.73 Final         Passed -  Patient is not pregnant      Passed - Valid encounter within last 12 months    Recent Outpatient Visits           2 months ago Chronic left shoulder pain   [facility] [place]. [facility] Marcine Matar, MD   5 months ago Primary hypertension   [facility] [place]. [facility] Claiborne Rigg, NP   1 year ago Bipolar affective disorder, currently manic, mild (HCC)   [facility] [place]. [facility] Marcine Matar, MD   1 year ago Essential hypertension   [facility] [place]. [facility] Marcine Matar, MD   2 years ago Essential hypertension   [facility] - [facility]  Dept Of [place]. [facility] Marcine Matar, MD

## 2023-07-02 ENCOUNTER — Other Ambulatory Visit: Payer: Self-pay | Admitting: Nurse Practitioner

## 2023-07-02 DIAGNOSIS — I10 Essential (primary) hypertension: Secondary | ICD-10-CM

## 2023-07-04 NOTE — Telephone Encounter (Signed)
 Requested Prescriptions  Pending Prescriptions Disp Refills   amLODipine  (NORVASC ) 10 MG tablet [Pharmacy Med Name: amLODIPine  Besylate 10 MG Oral Tablet] 90 tablet 0    Sig: Take 1 tablet by mouth once daily     Cardiovascular: Calcium  Channel Blockers 2 Passed - 07/04/2023 11:28 AM      Passed - Last BP in normal range    BP Readings from Last 1 Encounters:  06/01/23 130/68         Passed - Last Heart Rate in normal range    Pulse Readings from Last 1 Encounters:  06/01/23 (!) 59         Passed - Valid encounter within last 6 months    Recent Outpatient Visits           2 months ago Chronic left shoulder pain   [facility] [place]. [facility] Lawrance Presume, MD   6 months ago Primary hypertension   [facility] [facility] Of [place]. [facility] Collins Dean, NP   1 year ago Bipolar affective disorder, currently manic, mild (HCC)   [facility] [place]. [facility] Lawrance Presume, MD   1 year ago Essential hypertension   [facility] Of [place]. [facility] Lawrance Presume, MD   2 years ago Essential hypertension   [facility] [place] [facility] Dept Of [place]. [facility] Lawrance Presume, MD

## 2023-07-05 ENCOUNTER — Encounter: Payer: Self-pay | Admitting: Internal Medicine

## 2023-07-05 ENCOUNTER — Ambulatory Visit: Payer: Medicaid Other | Attending: Internal Medicine | Admitting: Internal Medicine

## 2023-07-05 ENCOUNTER — Other Ambulatory Visit (HOSPITAL_COMMUNITY)
Admission: RE | Admit: 2023-07-05 | Discharge: 2023-07-05 | Disposition: A | Discharge status: Home / Self Care | Payer: Medicaid Other | Source: Ambulatory Visit | Attending: Internal Medicine | Admitting: Internal Medicine

## 2023-07-05 VITALS — BP 119/72 | HR 68 | Wt 161.0 lb

## 2023-07-05 DIAGNOSIS — Z124 Encounter for screening for malignant neoplasm of cervix: Secondary | ICD-10-CM | POA: Insufficient documentation

## 2023-07-05 DIAGNOSIS — H8110 Benign paroxysmal vertigo, unspecified ear: Secondary | ICD-10-CM | POA: Diagnosis not present

## 2023-07-05 DIAGNOSIS — G8929 Other chronic pain: Secondary | ICD-10-CM | POA: Diagnosis not present

## 2023-07-05 DIAGNOSIS — M25512 Pain in left shoulder: Secondary | ICD-10-CM | POA: Diagnosis not present

## 2023-07-05 MED ORDER — DICLOFENAC SODIUM 75 MG PO TBEC
75.0000 mg | DELAYED_RELEASE_TABLET | Freq: Every day | ORAL | 4 refills | Status: DC
Start: 2023-07-05 — End: 2024-01-12

## 2023-07-05 NOTE — Patient Instructions (Addendum)
Stop Celebrex.  We have changed it to a Voltaren 75 mg to take once a day.  We will refer you for vestibular training for your dizziness.  Benign Positional Vertigo Vertigo is the feeling that you or your surroundings are moving when they are not. Benign positional vertigo is the most common form of vertigo. This is usually a harmless condition (benign). This condition is positional. This means that symptoms are triggered by certain movements and positions. This condition can be dangerous if it occurs while you are doing something that could cause harm to yourself or others. This includes activities such as driving or operating machinery. What are the causes? The inner ear has fluid-filled canals that help your brain sense movement and balance. When the fluid moves, the brain receives messages about your body's position. With benign positional vertigo, calcium crystals in the inner ear break free and disturb the inner ear area. This causes your brain to receive confusing messages about your body's position. What increases the risk? You are more likely to develop this condition if: You are a woman. You are 62 years of age or older. You have recently had a head injury. You have an inner ear disease. What are the signs or symptoms? Symptoms of this condition usually happen when you move your head or your eyes in different directions. Symptoms may start suddenly and usually last for less than a minute. They include: Loss of balance and falling. Feeling like you are spinning or moving. Feeling like your surroundings are spinning or moving. Nausea and vomiting. Blurred vision. Dizziness. Involuntary eye movement (nystagmus). Symptoms can be mild and cause only minor problems, or they can be severe and interfere with daily life. Episodes of benign positional vertigo may return (recur) over time. Symptoms may also improve over time. How is this diagnosed? This condition may be diagnosed based  on: Your medical history. A physical exam of the head, neck, and ears. Positional tests to check for or stimulate vertigo. You may be asked to turn your head and change positions, such as going from sitting to lying down. A health care provider will watch for symptoms of vertigo. You may be referred to a health care provider who specializes in ear, nose, and throat problems (ENT or otolaryngologist) or a provider who specializes in disorders of the nervous system (neurologist). How is this treated?  This condition may be treated in a session in which your health care provider moves your head in specific positions to help the displaced crystals in your inner ear move. Treatment for this condition may take several sessions. Surgery may be needed in severe cases, but this is rare. In some cases, benign positional vertigo may resolve on its own in 2-4 weeks. Follow these instructions at home: Safety Move slowly. Avoid sudden body or head movements or certain positions, as told by your health care provider. Avoid driving or operating machinery until your health care provider says it is safe. Avoid doing any tasks that would be dangerous to you or others if vertigo occurs. If you have trouble walking or keeping your balance, try using a cane for stability. If you feel dizzy or unstable, sit down right away. Return to your normal activities as told by your health care provider. Ask your health care provider what activities are safe for you. General instructions Take over-the-counter and prescription medicines only as told by your health care provider. Drink enough fluid to keep your urine pale yellow. Keep all follow-up visits. This  is important. Contact a health care provider if: You have a fever. Your condition gets worse or you develop new symptoms. Your family or friends notice any behavioral changes. You have nausea or vomiting that gets worse. You have numbness or a prickling and tingling  sensation. Get help right away if you: Have difficulty speaking or moving. Are always dizzy or faint. Develop severe headaches. Have weakness in your legs or arms. Have changes in your hearing or vision. Develop a stiff neck. Develop sensitivity to light. These symptoms may represent a serious problem that is an emergency. Do not wait to see if the symptoms will go away. Get medical help right away. Call your local emergency services (911 in the [place] Do not drive yourself to the hospital. Summary Vertigo is the feeling that you or your surroundings are moving when they are not. Benign positional vertigo is the most common form of vertigo. This condition is caused by calcium crystals in the inner ear that become displaced. This causes a disturbance in an area of the inner ear that helps your brain sense movement and balance. Symptoms include loss of balance and falling, feeling that you or your surroundings are moving, nausea and vomiting, and blurred vision. This condition can be diagnosed based on symptoms, a physical exam, and positional tests. Follow safety instructions as told by your health care provider and keep all follow-up visits. This is important. This information is not intended to replace advice given to you by your health care provider. Make sure you discuss any questions you have with your health care provider. Document Revised: 11/29/2022 Document Reviewed: 11/29/2022 Elsevier Patient Education  2024 [employer].

## 2023-07-05 NOTE — Progress Notes (Unsigned)
Patient ID: Rachel Hudson, female    DOB: [birth year]  MRN: [id number]  CC: Gynecologic Exam and Arthritis (Patient states she having it in her shoulder and is wanting better medication )   Subjective: Rachel Hudson is a 63 y.o. female who presents for PAP. Her concerns today include:  Patient with history of HTN, HL, hepatitis C (treated) with cirrhosis followed by GI, GERD and chronic lower back pain with hx surgery for spinal fixation of L4-L5, OA hands, bipolar affective ds, CTS hands.  Pain Management at [facility].    GYN History:  Pt is G0P0 Any hx of abn paps?: no Menses regular or irregular?: menpausal How long does menses last?  Menstrual flow light or heavy?:  Method of birth control?:  NA Any vaginal dischg at this time?: no Dysuria?: no Any hx of STI?: yes, treated Sexually active with how many partners:  1 female Desires STI screen: yes Last MMG: 9/24 Family hx of uterine, cervical or breast cancer?:  not sure, adopted  04/07/2024:  The patient presents with left shoulder pain that has been ongoing for several months, starting around July. She describes the pain as feeling like her bone is moving in and out and around, causing significant discomfort and limiting her daily activities. The pain is so severe that she cannot lay on it, drive, or sit for extended periods without experiencing stiffness and pain. She has been self-medicating with Aleve, taking almost five a day, but this only provides temporary relief. X-ray: IMPRESSION: Minimal glenohumeral and acromioclavicular degenerative change.  Pt was referred to ortho Dr. Roda Shutters.  Given steroid taper PO Pain persist. Celebrex not helping  Reports intermittent dizziness lasting several seconds for the past year.  Occurs when she rolls over in bed, if she turns her head too quickly or if she gets up too quickly.  Recent CBC was normal.  Patient Active Problem List   Diagnosis Date Noted   Portal hypertension (HCC)  04/23/2022   Marijuana user 03/30/2021   Bipolar 1 disorder (HCC) 01/28/2021   Insulin resistance 01/28/2021   Other hyperlipidemia 12/11/2020   Class 1 obesity with serious comorbidity and body mass index (BMI) of 32.0 to 32.9 in adult 12/03/2020   Influenza vaccine needed 02/26/2020   Bipolar affective disorder, currently manic, mild (HCC) 09/12/2017   Dysphagia 08/24/2017   Neuropathy of both feet 04/05/2017   Status post lumbar spinal fusion 10/22/2016   GERD (gastroesophageal reflux disease) 08/09/2016   Spondylolisthesis of lumbar region 07/29/2016   Sinus bradycardia 06/15/2016   Gingivitis 06/15/2016   ASCUS favor benign 11/13/2015   History of hepatitis C 10/13/2015   Bilateral carpal tunnel syndrome 09/16/2015   Cervical spondylosis without myelopathy 09/16/2015   Primary osteoarthritis of both hands 09/16/2015   Diverticulosis of colon without hemorrhage    Peptic ulcer disease    Spondylosis of lumbar region without myelopathy or radiculopathy 03/21/2015   Lumbago 11/18/2014   Carpal tunnel syndrome 11/18/2014   Hepatic cirrhosis (HCC) 08/06/2014   HTN (hypertension) 02/28/2014     Current Outpatient Medications on File Prior to Visit  Medication Sig Dispense Refill   amLODipine (NORVASC) 10 MG tablet Take 1 tablet by mouth once daily 90 tablet 0   Brimonidine Tartrate (LUMIFY OP) Place 1 drop into both eyes daily.     Cholecalciferol (VITAMIN D-3 PO) Take 1 tablet by mouth daily.     colchicine 0.6 MG tablet Take 1 tablet (0.6 mg total) by mouth 2 (two)  times daily. 180 tablet 1   COLLAGEN PO Take 1 Scoop by mouth in the morning, at noon, in the evening, and at bedtime.     gabapentin (NEURONTIN) 800 MG tablet TAKE 1 TABLET BY MOUTH EVERY 6 HOURS 120 tablet 5   lovastatin (MEVACOR) 10 MG tablet Take 10 mg by mouth daily.     MILK THISTLE PO Take 175 mg by mouth daily.     Multiple Vitamin (MULTIVITAMIN WITH MINERALS) TABS tablet Take 1 tablet by mouth daily.  Centrum Multivitamin     Nutritional Supplements (LIVER DEFENSE PO) Take 2 tablets by mouth in the morning. Liverite Liver Aid     Omega-3 Fatty Acids (FISH OIL PO) Take 1 capsule by mouth in the morning.     pantoprazole (PROTONIX) 40 MG tablet Take 1-2 tablets (40-80 mg total) by mouth daily before breakfast. Take one tablet once to twice daily before a meal as needed for acid reflux. 180 tablet 1   No current facility-administered medications on file prior to visit.    Allergies  Allergen Reactions   Penicillins     Facial swelling Has patient had a PCN reaction causing immediate rash, facial/tongue/throat swelling, SOB or lightheadedness with hypotension: Yes Has patient had a PCN reaction causing severe rash involving mucus membranes or skin necrosis: No Has patient had a PCN reaction that required hospitalization No Has patient had a PCN reaction occurring within the last 10 years: Yes If all of the above answers are "NO", then may proceed with Cephalosporin use.    Tylenol [Acetaminophen] Other (See Comments)    HX of Hep. C    Social History   Socioeconomic History   Marital status: Single    Spouse name: Not on file   Number of children: Not on file   Years of education: Not on file   Highest education level: Not on file  Occupational History   Occupation: disabled  Tobacco Use   Smoking status: Never   Smokeless tobacco: Never  Vaping Use   Vaping status: Never Used  Substance and Sexual Activity   Alcohol use: Not Currently   Drug use: Yes    Frequency: 7.0 times per week    Types: Marijuana    Comment: daily for arthritis   Sexual activity: Not Currently    Partners: Male    Birth control/protection: None, Condom  Other Topics Concern   Not on file  Social History Narrative   Not on file   Social Drivers of Health   Financial Resource Strain: High Risk (04/08/2023)   Overall Financial Resource Strain (CARDIA)    Difficulty of Paying Living Expenses:  Very hard  Food Insecurity: Food Insecurity Present (04/08/2023)   Hunger Vital Sign    Worried About Running Out of Food in the Last Year: Sometimes true    Ran Out of Food in the Last Year: Often true  Transportation Needs: No Transportation Needs (04/08/2023)   PRAPARE - Administrator, Civil Service (Medical): No    Lack of Transportation (Non-Medical): No  Physical Activity: Sufficiently Active (04/08/2023)   Exercise Vital Sign    Days of Exercise per Week: 7 days    Minutes of Exercise per Session: 30 min  Stress: No Stress Concern Present (04/08/2023)   [employer] of Occupational Health - Occupational Stress Questionnaire    Feeling of Stress : Not at all  Social Connections: Socially Isolated (04/08/2023)   Social Connection and Isolation Panel [NHANES]  Frequency of Communication with Friends and Family: Never    Frequency of Social Gatherings with Friends and Family: Never    Attends Religious Services: More than 4 times per year    Active Member of Clubs or Organizations: No    Attends Banker Meetings: Never    Marital Status: Never married  Intimate Partner Violence: Not At Risk (04/08/2023)   Humiliation, Afraid, Rape, and Kick questionnaire    Fear of Current or Ex-Partner: No    Emotionally Abused: No    Physically Abused: No    Sexually Abused: No    Family History  Adopted: Yes  Problem Relation Age of Onset   Colon cancer Neg Hx    Breast cancer Neg Hx     Past Surgical History:  Procedure Laterality Date   BACK SURGERY     lumbar fusion   BIOPSY  09/12/2017   Procedure: BIOPSY;  Surgeon: Corbin Ade, MD;  Location: [facility];  Service: Endoscopy;;  gastric   CARPAL TUNNEL RELEASE Right 11/13/2015   Procedure: RIGHT CARPAL TUNNEL RELEASE;  Surgeon: Betha Loa, MD;  Location: [facility];  Service: Orthopedics;  Laterality: Right;   CARPAL TUNNEL RELEASE Left 01/29/2016   Procedure: left CARPAL  TUNNEL RELEASE;  Surgeon: Betha Loa, MD;  Location: [facility];  Service: Orthopedics;  Laterality: Left;   COLONOSCOPY N/A 07/08/2015   Surgeon: Corbin Ade, MD; internal hemorrhoids, colonic diverticulosis.  Repeat in 10 years.   ESOPHAGOGASTRODUODENOSCOPY N/A 07/08/2015   Surgeon: Corbin Ade, MD; erosive reflux esophagitis, hiatal hernia, gastric erosion and ulcer s/p biopsy (reactive gastropathy, negative H. pylori), no esophageal varices or portal hypertension.   ESOPHAGOGASTRODUODENOSCOPY (EGD) WITH PROPOFOL N/A 09/12/2017   Surgeon: Corbin Ade, MD; normal esophagus s/p dilation, portal hypertensive gastropathy, erosive gastropathy biopsied (negative for H. pylori), normal examined duodenum.   ESOPHAGOGASTRODUODENOSCOPY (EGD) WITH PROPOFOL N/A 10/25/2019   Surgeon: Corbin Ade, MD;  normal esophagus s/p dilation, mild erosive reflux esophagitis, portal hypertensive gastropathy, due for repeat in 2023.   ESOPHAGOGASTRODUODENOSCOPY (EGD) WITH PROPOFOL N/A 04/14/2022   Procedure: ESOPHAGOGASTRODUODENOSCOPY (EGD) WITH PROPOFOL;  Surgeon: Corbin Ade, MD;  Location: [facility];  Service: Endoscopy;  Laterality: N/A;  11:15 am, pt knows to arrive at 7:30   FINGER ARTHROSCOPY WITH [facility]) ARTHROPLASTY Right 04/24/2020   Procedure: RIGHT INDEX FINGER METACARPAL PHALANGEAL ARTHROPLASTY;  Surgeon: Betha Loa, MD;  Location: [facility];  Service: Orthopedics;  Laterality: Right;   LIGAMENT REPAIR Right 1980   Knee   MALONEY DILATION N/A 09/12/2017   Procedure: Elease Hashimoto DILATION;  Surgeon: Corbin Ade, MD;  Location: [facility];  Service: Endoscopy;  Laterality: N/A;   MALONEY DILATION N/A 10/25/2019   Procedure: Elease Hashimoto DILATION;  Surgeon: Corbin Ade, MD;  Location: [facility];  Service: Endoscopy;  Laterality: N/A;   TRIGGER FINGER RELEASE Right 04/24/2020   Procedure: RIGHT LONG TRIGGER FINGER RELEASE TRIGGER  FINGER/A-1 PULLEY;  Surgeon: Betha Loa, MD;  Location: [facility];  Service: Orthopedics;  Laterality: Right;    ROS: Review of Systems Negative except as stated above  PHYSICAL EXAM: BP 119/72 (BP Location: Right Arm, Patient Position: Sitting, Cuff Size: Normal)   Pulse 68   Wt 161 lb (73 kg)   LMP 09/29/2016 (Approximate)   SpO2 94%   BMI 30.42 kg/m   Physical Exam Sitting: BP 133/77, P60 Standing: BP 119/72, P68 General appearance -  alert, well appearing, older African-American female and in no distress Mental status - normal mood, behavior, speech, dress, motor activity, and thought processes Mouth -moist oral mucosa Pelvic - Guy Franco RN present: normal external genitalia, vulva, vagina, cervix, uterus and adnexa. Cervical oz mildly stenosed. Neurological - cranial nerves II through XII intact, motor and sensory grossly normal bilaterally      Latest Ref Rng & Units 06/10/2023   11:26 AM 04/08/2023    2:23 PM 04/09/2022    9:40 AM  CMP  Glucose 70 - 99 mg/dL 99  85  91   BUN 8 - 27 mg/dL 14  14  17    Creatinine 0.57 - 1.00 mg/dL 2.95  6.21  3.08   Sodium 134 - 144 mmol/L 143  141  141   Potassium 3.5 - 5.2 mmol/L 4.1  4.5  4.1   Chloride 96 - 106 mmol/L 102  103  105   CO2 20 - 29 mmol/L 26  22  26    Calcium 8.7 - 10.3 mg/dL 9.5  9.7  9.3   Total Protein 6.0 - 8.5 g/dL 7.2  7.4  7.7   Total Bilirubin 0.0 - 1.2 mg/dL 0.4  <6.5  0.5   Alkaline Phos 44 - 121 IU/L 70  69  65   AST 0 - 40 IU/L 19  18  18    ALT 0 - 32 IU/L 18  14  18     Lipid Panel     Component Value Date/Time   CHOL 216 (H) 10/15/2020 0925   TRIG 78 10/15/2020 0925   HDL 51 10/15/2020 0925   CHOLHDL 5.1 (H) 10/26/2019 1022   CHOLHDL 2.8 09/28/2013 0904   VLDL 25 09/28/2013 0904   LDLCALC 151 (H) 10/15/2020 0925    CBC    Component Value Date/Time   WBC 8.4 06/10/2023 1126   WBC 7.2 04/09/2022 0940   RBC 4.68 06/10/2023 1126   RBC 4.58 04/09/2022 0940   HGB 13.8  06/10/2023 1126   HCT 41.7 06/10/2023 1126   PLT 172 06/10/2023 1126   MCV 89 06/10/2023 1126   MCH 29.5 06/10/2023 1126   MCH 29.7 04/09/2022 0940   MCHC 33.1 06/10/2023 1126   MCHC 33.1 04/09/2022 0940   RDW 13.0 06/10/2023 1126   LYMPHSABS 3.1 06/10/2023 1126   MONOABS 0.5 03/02/2018 0904   EOSABS 0.2 06/10/2023 1126   BASOSABS 0.1 06/10/2023 1126    ASSESSMENT AND PLAN:  1. Pap smear for cervical cancer screening (Primary) - Cervicovaginal ancillary only - Cytology - PAP  2. Chronic left shoulder pain Patient feels Celebrex no longer helps.  Discussed changing her to a different nonsteroidal in the form of oral Voltaren - diclofenac (VOLTAREN) 75 MG EC tablet; Take 1 tablet (75 mg total) by mouth daily.  Dispense: 30 tablet; Refill: 4  3. Benign paroxysmal positional vertigo, unspecified laterality Advised to go slow with position changes.  Will refer to physical therapy for vestibular training. - Ambulatory referral to Physical Therapy   Patient was given the opportunity to ask questions.  Patient verbalized understanding of the plan and was able to repeat key elements of the plan.   This documentation was completed using Paediatric nurse.  Any transcriptional errors are unintentional.     Requested Prescriptions   Signed Prescriptions Disp Refills   diclofenac (VOLTAREN) 75 MG EC tablet 30 tablet 4    Sig: Take 1 tablet (75 mg total) by mouth daily.  Return in about 4 months (around 11/02/2023).  Jonah Blue, MD, FACP

## 2023-07-06 ENCOUNTER — Encounter: Payer: Self-pay | Admitting: Internal Medicine

## 2023-07-06 LAB — CERVICOVAGINAL ANCILLARY ONLY
Bacterial Vaginitis (gardnerella): NEGATIVE
Candida Glabrata: NEGATIVE
Candida Vaginitis: NEGATIVE
Chlamydia: NEGATIVE
Comment: NEGATIVE
Comment: NEGATIVE
Comment: NEGATIVE
Comment: NEGATIVE
Comment: NEGATIVE
Comment: NORMAL
Neisseria Gonorrhea: NEGATIVE
Trichomonas: NEGATIVE

## 2023-07-07 NOTE — Therapy (Signed)
OUTPATIENT PHYSICAL THERAPY VESTIBULAR EVALUATION     Patient Name: Rachel Hudson MRN: [id number] DOB:1961/01/31, 63 y.o., female Today's Date: 07/08/2023  END OF SESSION:  PT End of Session - 07/08/23 1016     Visit Number 1    Number of Visits 5    Date for PT Re-Evaluation 08/07/23    Authorization Type [employer] MEDICAID    PT Start Time 1015    PT Stop Time 1055    PT Time Calculation (min) 40 min    Activity Tolerance Patient tolerated treatment well    Behavior During Therapy WFL for tasks assessed/performed             Past Medical History:  Diagnosis Date   ADD (attention deficit disorder)    ADHD    Anxiety    Arthritis    Arthritis    Back pain    Bipolar 1 disorder (HCC)    Cirrhosis of liver (HCC) 01/2014   Stage 4   Foot pain, bilateral    GERD (gastroesophageal reflux disease)    Gout    Hand pain    Hep C w/o coma, chronic (HCC) As of 10/22/13   S/p treatment with Harvoni, achieved SVR   Hyperlipidemia    Hypertension    Joint pain    Liver problem    Osteoarthritis    Other fatigue    Shortness of breath    Shortness of breath on exertion    Vitamin D deficiency    Past Surgical History:  Procedure Laterality Date   BACK SURGERY     lumbar fusion   BIOPSY  09/12/2017   Procedure: BIOPSY;  Surgeon: Corbin Ade, MD;  [facility];  Service: Endoscopy;;  gastric   CARPAL TUNNEL RELEASE Right 11/13/2015   Procedure: RIGHT CARPAL TUNNEL RELEASE;  Surgeon: Betha Loa, MD;  Location: [facility];  Service: Orthopedics;  Laterality: Right;   CARPAL TUNNEL RELEASE Left 01/29/2016   Procedure: left CARPAL TUNNEL RELEASE;  Surgeon: Betha Loa, MD;  Location: [facility];  Service: Orthopedics;  Laterality: Left;   COLONOSCOPY N/A 07/08/2015   Surgeon: Corbin Ade, MD; internal hemorrhoids, colonic diverticulosis.  Repeat in 10 years.   ESOPHAGOGASTRODUODENOSCOPY N/A 07/08/2015   Surgeon: Corbin Ade, MD; erosive reflux esophagitis, hiatal hernia, gastric erosion and ulcer s/p biopsy (reactive gastropathy, negative H. pylori), no esophageal varices or portal hypertension.   ESOPHAGOGASTRODUODENOSCOPY (EGD) WITH PROPOFOL N/A 09/12/2017   Surgeon: Corbin Ade, MD; normal esophagus s/p dilation, portal hypertensive gastropathy, erosive gastropathy biopsied (negative for H. pylori), normal examined duodenum.   ESOPHAGOGASTRODUODENOSCOPY (EGD) WITH PROPOFOL N/A 10/25/2019   Surgeon: Corbin Ade, MD;  normal esophagus s/p dilation, mild erosive reflux esophagitis, portal hypertensive gastropathy, due for repeat in 2023.   ESOPHAGOGASTRODUODENOSCOPY (EGD) WITH PROPOFOL N/A 04/14/2022   Procedure: ESOPHAGOGASTRODUODENOSCOPY (EGD) WITH PROPOFOL;  Surgeon: Corbin Ade, MD;  [facility];  Service: Endoscopy;  Laterality: N/A;  11:15 am, pt knows to arrive at 7:30   FINGER ARTHROSCOPY WITH CARPOMETACARPEL [facility]) ARTHROPLASTY Right 04/24/2020   Procedure: RIGHT INDEX FINGER METACARPAL PHALANGEAL ARTHROPLASTY;  Surgeon: Betha Loa, MD;  Location: [facility];  Service: Orthopedics;  Laterality: Right;   LIGAMENT REPAIR Right 1980   Knee   MALONEY DILATION N/A 09/12/2017   Procedure: Elease Hashimoto DILATION;  Surgeon: Corbin Ade, MD;  [facility];  Service: Endoscopy;  Laterality: N/A;  MALONEY DILATION N/A 10/25/2019   Procedure: Elease Hashimoto DILATION;  Surgeon: Corbin Ade, MD;  [facility];  Service: Endoscopy;  Laterality: N/A;   TRIGGER FINGER RELEASE Right 04/24/2020   Procedure: RIGHT LONG TRIGGER FINGER RELEASE TRIGGER FINGER/A-1 PULLEY;  Surgeon: Betha Loa, MD;  Location: [facility];  Service: Orthopedics;  Laterality: Right;   Patient Active Problem List   Diagnosis Date Noted   Portal hypertension (HCC) 04/23/2022   Marijuana user 03/30/2021   Bipolar 1 disorder (HCC) 01/28/2021   Insulin resistance 01/28/2021    Other hyperlipidemia 12/11/2020   Class 1 obesity with serious comorbidity and body mass index (BMI) of 32.0 to 32.9 in adult 12/03/2020   Influenza vaccine needed 02/26/2020   Bipolar affective disorder, currently manic, mild (HCC) 09/12/2017   Dysphagia 08/24/2017   Neuropathy of both feet 04/05/2017   Status post lumbar spinal fusion 10/22/2016   GERD (gastroesophageal reflux disease) 08/09/2016   Spondylolisthesis of lumbar region 07/29/2016   Sinus bradycardia 06/15/2016   Gingivitis 06/15/2016   ASCUS favor benign 11/13/2015   History of hepatitis C 10/13/2015   Bilateral carpal tunnel syndrome 09/16/2015   Cervical spondylosis without myelopathy 09/16/2015   Primary osteoarthritis of both hands 09/16/2015   Diverticulosis of colon without hemorrhage    Peptic ulcer disease    Spondylosis of lumbar region without myelopathy or radiculopathy 03/21/2015   Lumbago 11/18/2014   Carpal tunnel syndrome 11/18/2014   Hepatic cirrhosis (HCC) 08/06/2014   HTN (hypertension) 02/28/2014    PCP: Marcine Matar, MD REFERRING PROVIDER: Marcine Matar, MD  REFERRING DIAG: H81.10 (ICD-10-CM) - Benign paroxysmal positional vertigo, unspecified laterality  THERAPY DIAG:  BPPV (benign paroxysmal positional vertigo), bilateral  Dizziness and giddiness  ONSET DATE: 07/05/2023  Rationale for Evaluation and Treatment: Rehabilitation  SUBJECTIVE:   SUBJECTIVE STATEMENT: Has been having a whole lot of dizziness - started about a year ago. Notes it has been there for years, but it has continually been getting worse. Not sure how many years she has been experiencing it. "Smokes a lot of marijuana, but its from the hemp store. Unsure if this makes me dizzy". Definitely laying down or turning in her sleep will make her dizzy. "Just happens when she is moving around". Mainly just tripping and has lost her balance every now and then, but it is not as bad at the dizziness. Sometimes will be  moving and walking and then just get lightheaded for a minute. Notes dizziness is worse as the day goes on   Pt accompanied by: self  PERTINENT HISTORY: PMH: HTN, HLD, hepatitis C (treated) with cirrhosis followed by GI, GERD and chronic lower back pain with hx surgery for spinal fixation of L4-L5, OA hands, bipolar affective ds, CTS hands   Per PCP: Reports intermittent dizziness lasting several seconds for the past year. Occurs when she rolls over in bed, if she turns her head too quickly or if she gets up too quickly.   PAIN:  Are you having pain? "Has arthritis in her shoulder and pain in her side"   Vitals:   07/08/23 1027  BP: 119/74  Pulse: 70     PRECAUTIONS: None   FALLS: Has patient fallen in last 6 months? No  PLOF: Independent and Still driving, but does not go too far due to carpal tunnel and her feet cramping up, not the dizziness   PATIENT GOALS: Wants to get rid of the dizziness   OBJECTIVE:  Note:  Objective measures were completed at Evaluation unless otherwise noted.  COGNITION: Overall cognitive status: Within functional limits for tasks assessed    Cervical ROM:   WNL  GAIT: Gait pattern: WFL Distance walked: Clinic distances  Assistive device utilized: None Level of assistance: Complete Independence Comments: No overt unsteadiness noted. Pt reporting feeling better when ambulating out of session    VESTIBULAR ASSESSMENT:  GENERAL OBSERVATION: Ambulates in independently with no AD.    SYMPTOM BEHAVIOR:  Subjective history: See above.   Non-Vestibular symptoms: changes in vision, headaches, and reports vision changes, needs to see an eye doctor   Type of dizziness: Spinning/Vertigo and "Wooziness"   Frequency: About a minute   Duration: Daily   Aggravating factors: Induced by position change: rolling to the right and rolling to the left and Induced by motion: occur when walking, looking up at the ceiling, bending down to the ground, turning  body quickly, and turning head quickly  Relieving factors: no known relieving factors  Progression of symptoms: worse  OCULOMOTOR EXAM:  Ocular Alignment: normal  Ocular ROM: No Limitations  Spontaneous Nystagmus: absent  Gaze-Induced Nystagmus: absent  Smooth Pursuits: intact, cued to just move eyes instead of head   Saccades: intact  VESTIBULAR - OCULAR REFLEX:   Slow VOR: Normal  VOR Cancellation: Normal, feels wooziness   Head-Impulse Test: HIT Right: negative HIT Left: negative No dizziness      POSITIONAL TESTING: Right Dix-Hallpike: upbeating, right nystagmus and lasting approx. 5-8 seconds, initially more intense  Left Dix-Hallpike: upbeating, left nystagmus and lasting approx. 8-10 seconds, more symptoms on this side   Pt with more dizziness when coming to sit upright from L DixHallpike                                                                                                                              TREATMENT DATE: 07/08/23   Canalith Repositioning:   Epley Left: Number of Reps: 1, Response to Treatment: symptoms resolved, and Comment: with re-assessment, pt demonstrating no nystagmus/dizziness   Epley Right: Number of Reps: 1, Response to Treatment: symptoms improved, and Comment: pt with mild dizziness/wooziness with return to upright that subsided quickly, did not get to re-assess at end of eval due to time constraints    When ambulating out of session, pt reporting feeling better than when she came in    PATIENT EDUCATION: Education details: Clinical findings, POC, BPPV education/etiology, purpose of Epley maneuver for treating and provided handout. Educated to stay well hydrated  Person educated: Patient Education method: Programmer, multimedia, Demonstration, Verbal cues, and Handouts Education comprehension: verbalized understanding  HOME EXERCISE PROGRAM: Will provide at future session as needed   GOALS: Goals reviewed with patient? Yes  SHORT TERM  GOALS: ALL STGS = LTGS    LONG TERM GOALS: Target date: 08/05/2023   Pt will demonstrate resolution of bilateral posterior canal BPPV in order to demo decr dizziness for daily life.  Baseline: (+)  R and L posterior canalithiasis  Goal status: INITIAL   ASSESSMENT:  CLINICAL IMPRESSION: Patient is a 63 year old female referred to Neuro OPPT for BPPV.   Pt's PMH is significant for: HTN, HLD, hepatitis C (treated) with cirrhosis followed by GI, GERD and chronic lower back pain with hx surgery for spinal fixation of L4-L5, OA hands, bipolar affective ds, CTS hand. The following deficits were present during the exam: upbeating rotary nystagmus in R and L DixHallpike position indicating bilateral posterior canalithiasis (worse symptoms on L side). Treated x1 rep of the Epley maneuver on the L side with pt demonstrating resolution after 1 rep. Also treated with R Epley maneuver. Due to time constraints, did not get to re-assess. Pt reporting feeling better than when she came in when ambulating out of clinic. Pt would benefit from skilled PT to address these impairments and functional limitations to maximize functional mobility independence and decr dizziness.    OBJECTIVE IMPAIRMENTS: decreased balance and dizziness.   ACTIVITY LIMITATIONS: bending, bed mobility, reach over head, and locomotion level  PARTICIPATION LIMITATIONS: community activity  PERSONAL FACTORS: Age, Behavior pattern, Past/current experiences, Time since onset of injury/illness/exacerbation, and 3+ comorbidities:  HTN, HLD, hepatitis C (treated) with cirrhosis followed by GI, GERD and chronic lower back pain with hx surgery for spinal fixation of L4-L5, OA hands, bipolar affective ds, CTS hands   are also affecting patient's functional outcome.   REHAB POTENTIAL: Good  CLINICAL DECISION MAKING: Stable/uncomplicated  EVALUATION COMPLEXITY: Low   PLAN:  PT FREQUENCY: 1-2x/week  PT DURATION: 4 weeks  PLANNED  INTERVENTIONS: 97164- PT Re-evaluation, 97110-Therapeutic exercises, 97530- Therapeutic activity, [id number]- Neuromuscular re-education, 97535- Self Care, [id number]- Manual therapy, [phone]- Canalith repositioning, Patient/Family education, Balance training, and Vestibular training  PLAN FOR NEXT SESSION: check for bilateral BPPV and treat as needed    Drake Leach, PT, DPT 07/08/2023, 10:59 AM

## 2023-07-08 ENCOUNTER — Ambulatory Visit: Payer: Medicaid Other | Attending: Internal Medicine | Admitting: Physical Therapy

## 2023-07-08 ENCOUNTER — Encounter: Payer: Self-pay | Admitting: Physical Therapy

## 2023-07-08 VITALS — BP 119/74 | HR 70

## 2023-07-08 DIAGNOSIS — R42 Dizziness and giddiness: Secondary | ICD-10-CM | POA: Diagnosis present

## 2023-07-08 DIAGNOSIS — H8113 Benign paroxysmal vertigo, bilateral: Secondary | ICD-10-CM | POA: Insufficient documentation

## 2023-07-08 DIAGNOSIS — H8110 Benign paroxysmal vertigo, unspecified ear: Secondary | ICD-10-CM | POA: Insufficient documentation

## 2023-07-11 LAB — CYTOLOGY - PAP
Comment: NEGATIVE
Diagnosis: NEGATIVE
High risk HPV: NEGATIVE

## 2023-07-12 ENCOUNTER — Encounter: Payer: Self-pay | Admitting: Physical Therapy

## 2023-07-12 ENCOUNTER — Ambulatory Visit: Payer: Medicaid Other | Admitting: Physical Therapy

## 2023-07-12 VITALS — BP 131/75 | HR 56

## 2023-07-12 DIAGNOSIS — H8113 Benign paroxysmal vertigo, bilateral: Secondary | ICD-10-CM | POA: Diagnosis not present

## 2023-07-12 DIAGNOSIS — R42 Dizziness and giddiness: Secondary | ICD-10-CM

## 2023-07-12 NOTE — Therapy (Signed)
OUTPATIENT PHYSICAL THERAPY VESTIBULAR TREATMENT     Patient Name: Rachel Hudson MRN: [id number] DOB:[birth year], 63 y.o., female Today's Date: 07/12/2023  END OF SESSION:  PT End of Session - 07/12/23 0934     Visit Number 2    Number of Visits 5    Date for PT Re-Evaluation 08/07/23    Authorization Type [employer] MEDICAID    PT Start Time 0934    PT Stop Time 1007    PT Time Calculation (min) 33 min    Activity Tolerance Patient tolerated treatment well    Behavior During Therapy WFL for tasks assessed/performed             Past Medical History:  Diagnosis Date   ADD (attention deficit disorder)    ADHD    Anxiety    Arthritis    Arthritis    Back pain    Bipolar 1 disorder (HCC)    Cirrhosis of liver (HCC) 01/2014   Stage 4   Foot pain, bilateral    GERD (gastroesophageal reflux disease)    Gout    Hand pain    Hep C w/o coma, chronic (HCC) As of 10/22/13   S/p treatment with Harvoni, achieved SVR   Hyperlipidemia    Hypertension    Joint pain    Liver problem    Osteoarthritis    Other fatigue    Shortness of breath    Shortness of breath on exertion    Vitamin D deficiency    Past Surgical History:  Procedure Laterality Date   BACK SURGERY     lumbar fusion   BIOPSY  09/12/2017   Procedure: BIOPSY;  Surgeon: Rachel Ade, MD;  [facility];  Service: Endoscopy;;  gastric   CARPAL TUNNEL RELEASE Right 11/13/2015   Procedure: RIGHT CARPAL TUNNEL RELEASE;  Surgeon: Rachel Loa, MD;  Location: [facility];  Service: Orthopedics;  Laterality: Right;   CARPAL TUNNEL RELEASE Left 01/29/2016   Procedure: left CARPAL TUNNEL RELEASE;  Surgeon: Rachel Loa, MD;  Location: [facility];  Service: Orthopedics;  Laterality: Left;   COLONOSCOPY N/A 07/08/2015   Surgeon: Rachel Ade, MD; internal hemorrhoids, colonic diverticulosis.  Repeat in 10 years.   ESOPHAGOGASTRODUODENOSCOPY N/A 07/08/2015   Surgeon: Rachel Ade, MD; erosive reflux esophagitis, hiatal hernia, gastric erosion and ulcer s/p biopsy (reactive gastropathy, negative H. pylori), no esophageal varices or portal hypertension.   ESOPHAGOGASTRODUODENOSCOPY (EGD) WITH PROPOFOL N/A 09/12/2017   Surgeon: Rachel Ade, MD; normal esophagus s/p dilation, portal hypertensive gastropathy, erosive gastropathy biopsied (negative for H. pylori), normal examined duodenum.   ESOPHAGOGASTRODUODENOSCOPY (EGD) WITH PROPOFOL N/A 10/25/2019   Surgeon: Rachel Ade, MD;  normal esophagus s/p dilation, mild erosive reflux esophagitis, portal hypertensive gastropathy, due for repeat in 2023.   ESOPHAGOGASTRODUODENOSCOPY (EGD) WITH PROPOFOL N/A 04/14/2022   Procedure: ESOPHAGOGASTRODUODENOSCOPY (EGD) WITH PROPOFOL;  Surgeon: Rachel Ade, MD;  [facility];  Service: Endoscopy;  Laterality: N/A;  11:15 am, pt knows to arrive at 7:30   FINGER ARTHROSCOPY WITH CARPOMETACARPEL [facility]) ARTHROPLASTY Right 04/24/2020   Procedure: RIGHT INDEX FINGER METACARPAL PHALANGEAL ARTHROPLASTY;  Surgeon: Rachel Loa, MD;  Location: [facility];  Service: Orthopedics;  Laterality: Right;   LIGAMENT REPAIR Right 1980   Knee   MALONEY DILATION N/A 09/12/2017   Procedure: Rachel Hudson DILATION;  Surgeon: Rachel Ade, MD;  [facility];  Service: Endoscopy;  Laterality: N/A;  MALONEY DILATION N/A 10/25/2019   Procedure: Rachel Hudson DILATION;  Surgeon: Rachel Ade, MD;  [facility];  Service: Endoscopy;  Laterality: N/A;   TRIGGER FINGER RELEASE Right 04/24/2020   Procedure: RIGHT LONG TRIGGER FINGER RELEASE TRIGGER FINGER/A-1 PULLEY;  Surgeon: Rachel Loa, MD;  Location: [facility];  Service: Orthopedics;  Laterality: Right;   Patient Active Problem List   Diagnosis Date Noted   Portal hypertension (HCC) 04/23/2022   Marijuana user 03/30/2021   Bipolar 1 disorder (HCC) 01/28/2021   Insulin resistance 01/28/2021    Other hyperlipidemia 12/11/2020   Class 1 obesity with serious comorbidity and body mass index (BMI) of 32.0 to 32.9 in adult 12/03/2020   Influenza vaccine needed 02/26/2020   Bipolar affective disorder, currently manic, mild (HCC) 09/12/2017   Dysphagia 08/24/2017   Neuropathy of both feet 04/05/2017   Status post lumbar spinal fusion 10/22/2016   GERD (gastroesophageal reflux disease) 08/09/2016   Spondylolisthesis of lumbar region 07/29/2016   Sinus bradycardia 06/15/2016   Gingivitis 06/15/2016   ASCUS favor benign 11/13/2015   History of hepatitis C 10/13/2015   Bilateral carpal tunnel syndrome 09/16/2015   Cervical spondylosis without myelopathy 09/16/2015   Primary osteoarthritis of both hands 09/16/2015   Diverticulosis of colon without hemorrhage    Peptic ulcer disease    Spondylosis of lumbar region without myelopathy or radiculopathy 03/21/2015   Lumbago 11/18/2014   Carpal tunnel syndrome 11/18/2014   Hepatic cirrhosis (HCC) 08/06/2014   HTN (hypertension) 02/28/2014    PCP: Rachel Matar, MD REFERRING PROVIDER: Marcine Matar, MD  REFERRING DIAG: H81.10 (ICD-10-CM) - Benign paroxysmal positional vertigo, unspecified laterality  THERAPY DIAG:  BPPV (benign paroxysmal positional vertigo), bilateral  Dizziness and giddiness  ONSET DATE: 07/05/2023  Rationale for Evaluation and Treatment: Rehabilitation  SUBJECTIVE:   SUBJECTIVE STATEMENT: Patient reports that she is feeling about the same. She is still getting the room spinning every monring. She denies falls and near falls.   Pt accompanied by: self  PERTINENT HISTORY: PMH: HTN, HLD, hepatitis C (treated) with cirrhosis followed by GI, GERD and chronic lower back pain with hx surgery for spinal fixation of L4-L5, OA hands, bipolar affective ds, CTS hands   Per PCP: Reports intermittent dizziness lasting several seconds for the past year. Occurs when she rolls over in bed, if she turns her head  too quickly or if she gets up too quickly.   PAIN:  Are you having pain? "Has arthritis in her shoulder and pain in her side"   Vitals:   07/12/23 0937 07/12/23 1004  BP: 118/71 131/75  Pulse: (!) 56 (!) 56    PRECAUTIONS: None   FALLS: Has patient fallen in last 6 months? No  PLOF: Independent and Still driving, but does not go too far due to carpal tunnel and her feet cramping up, not the dizziness   PATIENT GOALS: Wants to get rid of the dizziness   OBJECTIVE:  Note: Objective measures were completed at Evaluation unless otherwise noted.  COGNITION: Overall cognitive status: Within functional limits for tasks assessed  TREATMENT DATE: 07/12/2023  NMR:  Positional testing:  L Dix Hallpike: L upbeating torsional nystagmus with duration < 10 seconds and laterancy ~2 seconds  After patient treated for L Posterior canalithiasis as noted below, assessed R Dix hallpike: patient with larger amplitude R torsional upbeating nystagmus with latency ~1-2 seconds with 8 second duration and treated as noted below VBI: negative bilaterally  VESTIBULAR TREATMENT:  Canalith Repositioning: Epley Right: Number of Reps: 1, Response to Treatment: symptoms improved, and Comment: treated second, patient with no nystagmus when retested with Gilberto Better, reported some dizziness when spinning to turn around and face table with minor blurriness in vision that improved, vitals assessed and were [facility] continue to monitor and Epley Left: Number of Reps: 3, Response to Treatment: symptoms resolved, and Comment: no nystagmus noted when retested, patient presenting with R torsional upbeat when turned to position 2 which may be due to bilateral symptoms   After Epley maneuver, assessed L and R sidelying as second assessment: no nystagmus noted, patient with some dizziness when sitting up and some when leaving clinic, advise  reassessing all canals next session to ensure full resolution and no conversion   Treatment ended slightly early due to patient tolerance and clear positional testing    PATIENT EDUCATION: Education details: Education on letting things settle for 24 hours as extra safety precaution and then resume all normal activity  Person educated: Patient Education method: Programmer, multimedia, Facilities manager, Verbal cues, and Handouts Education comprehension: verbalized understanding  HOME EXERCISE PROGRAM: Will provide at future session as needed   GOALS: Goals reviewed with patient? Yes  SHORT TERM GOALS: ALL STGS = LTGS    LONG TERM GOALS: Target date: 08/05/2023   Pt will demonstrate resolution of bilateral posterior canal BPPV in order to demo decr dizziness for daily life.  Baseline: (+) R and L posterior canalithiasis  Goal status: INITIAL   ASSESSMENT:  CLINICAL IMPRESSION: Emphasis of skilled PT session on continued treatment of L and R canalithiasis. Patient presenting with R worse than L in today's session; positional testing clear by end of session but patient reporting some dizziness walking out of clinic so advise reassessment next session with treatment as indicated.  OBJECTIVE IMPAIRMENTS: decreased balance and dizziness.   ACTIVITY LIMITATIONS: bending, bed mobility, reach over head, and locomotion level  PARTICIPATION LIMITATIONS: community activity  PERSONAL FACTORS: Age, Behavior pattern, Past/current experiences, Time since onset of injury/illness/exacerbation, and 3+ comorbidities:  HTN, HLD, hepatitis C (treated) with cirrhosis followed by GI, GERD and chronic lower back pain with hx surgery for spinal fixation of L4-L5, OA hands, bipolar affective ds, CTS hands   are also affecting patient's functional outcome.   REHAB POTENTIAL: Good  CLINICAL DECISION MAKING: Stable/uncomplicated  EVALUATION COMPLEXITY: Low   PLAN:  PT FREQUENCY: 1-2x/week  PT DURATION: 4  weeks  PLANNED INTERVENTIONS: 97164- PT Re-evaluation, 97110-Therapeutic exercises, 97530- Therapeutic activity, 97112- Neuromuscular re-education, 97535- Self Care, 82956- Manual therapy, [phone]- Canalith repositioning, Patient/Family education, Balance training, and Vestibular training  PLAN FOR NEXT SESSION: check for bilateral BPPV and treat as needed (recommend assessing all canals, R Dix hallpike worse than L last session)    Carmelia Bake, PT, DPT 07/12/2023, 12:10 PM

## 2023-07-15 ENCOUNTER — Ambulatory Visit: Payer: Medicaid Other | Admitting: Physical Therapy

## 2023-07-15 ENCOUNTER — Encounter: Payer: Self-pay | Admitting: Physical Therapy

## 2023-07-15 VITALS — BP 119/78 | HR 61

## 2023-07-15 DIAGNOSIS — R42 Dizziness and giddiness: Secondary | ICD-10-CM

## 2023-07-15 DIAGNOSIS — H8113 Benign paroxysmal vertigo, bilateral: Secondary | ICD-10-CM

## 2023-07-15 NOTE — Therapy (Signed)
OUTPATIENT PHYSICAL THERAPY VESTIBULAR TREATMENT     Patient Name: Rachel Hudson MRN: [id number] DOB:01-24-1961, 63 y.o., female Today's Date: 07/15/2023  END OF SESSION:  PT End of Session - 07/15/23 1016     Visit Number 3    Number of Visits 5    Date for PT Re-Evaluation 08/07/23    Authorization Type [employer] MEDICAID    PT Start Time 1014    PT Stop Time 1037   full time not needed due to resolution of BPPV   PT Time Calculation (min) 23 min    Activity Tolerance Patient tolerated treatment well    Behavior During Therapy WFL for tasks assessed/performed             Past Medical History:  Diagnosis Date   ADD (attention deficit disorder)    ADHD    Anxiety    Arthritis    Arthritis    Back pain    Bipolar 1 disorder (HCC)    Cirrhosis of liver (HCC) 01/2014   Stage 4   Foot pain, bilateral    GERD (gastroesophageal reflux disease)    Gout    Hand pain    Hep C w/o coma, chronic (HCC) As of 10/22/13   S/p treatment with Harvoni, achieved SVR   Hyperlipidemia    Hypertension    Joint pain    Liver problem    Osteoarthritis    Other fatigue    Shortness of breath    Shortness of breath on exertion    Vitamin D deficiency    Past Surgical History:  Procedure Laterality Date   BACK SURGERY     lumbar fusion   BIOPSY  09/12/2017   Procedure: BIOPSY;  Surgeon: Corbin Ade, MD;  [facility];  Service: Endoscopy;;  gastric   CARPAL TUNNEL RELEASE Right 11/13/2015   Procedure: RIGHT CARPAL TUNNEL RELEASE;  Surgeon: Betha Loa, MD;  Location: [facility];  Service: Orthopedics;  Laterality: Right;   CARPAL TUNNEL RELEASE Left 01/29/2016   Procedure: left CARPAL TUNNEL RELEASE;  Surgeon: Betha Loa, MD;  Location: [facility];  Service: Orthopedics;  Laterality: Left;   COLONOSCOPY N/A 07/08/2015   Surgeon: Corbin Ade, MD; internal hemorrhoids, colonic diverticulosis.  Repeat in 10 years.    ESOPHAGOGASTRODUODENOSCOPY N/A 07/08/2015   Surgeon: Corbin Ade, MD; erosive reflux esophagitis, hiatal hernia, gastric erosion and ulcer s/p biopsy (reactive gastropathy, negative H. pylori), no esophageal varices or portal hypertension.   ESOPHAGOGASTRODUODENOSCOPY (EGD) WITH PROPOFOL N/A 09/12/2017   Surgeon: Corbin Ade, MD; normal esophagus s/p dilation, portal hypertensive gastropathy, erosive gastropathy biopsied (negative for H. pylori), normal examined duodenum.   ESOPHAGOGASTRODUODENOSCOPY (EGD) WITH PROPOFOL N/A 10/25/2019   Surgeon: Corbin Ade, MD;  normal esophagus s/p dilation, mild erosive reflux esophagitis, portal hypertensive gastropathy, due for repeat in 2023.   ESOPHAGOGASTRODUODENOSCOPY (EGD) WITH PROPOFOL N/A 04/14/2022   Procedure: ESOPHAGOGASTRODUODENOSCOPY (EGD) WITH PROPOFOL;  Surgeon: Corbin Ade, MD;  [facility];  Service: Endoscopy;  Laterality: N/A;  11:15 am, pt knows to arrive at 7:30   FINGER ARTHROSCOPY WITH CARPOMETACARPEL [facility]) ARTHROPLASTY Right 04/24/2020   Procedure: RIGHT INDEX FINGER METACARPAL PHALANGEAL ARTHROPLASTY;  Surgeon: Betha Loa, MD;  Location: [facility];  Service: Orthopedics;  Laterality: Right;   LIGAMENT REPAIR Right 1980   Knee   MALONEY DILATION N/A 09/12/2017   Procedure: Elease Hashimoto DILATION;  Surgeon: Corbin Ade, MD;  [facility];  Service: Endoscopy;  Laterality: N/A;   MALONEY DILATION N/A 10/25/2019   Procedure: Elease Hashimoto DILATION;  Surgeon: Corbin Ade, MD;  [facility];  Service: Endoscopy;  Laterality: N/A;   TRIGGER FINGER RELEASE Right 04/24/2020   Procedure: RIGHT LONG TRIGGER FINGER RELEASE TRIGGER FINGER/A-1 PULLEY;  Surgeon: Betha Loa, MD;  Location: [facility];  Service: Orthopedics;  Laterality: Right;   Patient Active Problem List   Diagnosis Date Noted   Portal hypertension (HCC) 04/23/2022   Marijuana user 03/30/2021    Bipolar 1 disorder (HCC) 01/28/2021   Insulin resistance 01/28/2021   Other hyperlipidemia 12/11/2020   Class 1 obesity with serious comorbidity and body mass index (BMI) of 32.0 to 32.9 in adult 12/03/2020   Influenza vaccine needed 02/26/2020   Bipolar affective disorder, currently manic, mild (HCC) 09/12/2017   Dysphagia 08/24/2017   Neuropathy of both feet 04/05/2017   Status post lumbar spinal fusion 10/22/2016   GERD (gastroesophageal reflux disease) 08/09/2016   Spondylolisthesis of lumbar region 07/29/2016   Sinus bradycardia 06/15/2016   Gingivitis 06/15/2016   ASCUS favor benign 11/13/2015   History of hepatitis C 10/13/2015   Bilateral carpal tunnel syndrome 09/16/2015   Cervical spondylosis without myelopathy 09/16/2015   Primary osteoarthritis of both hands 09/16/2015   Diverticulosis of colon without hemorrhage    Peptic ulcer disease    Spondylosis of lumbar region without myelopathy or radiculopathy 03/21/2015   Lumbago 11/18/2014   Carpal tunnel syndrome 11/18/2014   Hepatic cirrhosis (HCC) 08/06/2014   HTN (hypertension) 02/28/2014    PCP: Marcine Matar, MD REFERRING PROVIDER: Marcine Matar, MD  REFERRING DIAG: H81.10 (ICD-10-CM) - Benign paroxysmal positional vertigo, unspecified laterality  THERAPY DIAG:  BPPV (benign paroxysmal positional vertigo), bilateral  Dizziness and giddiness  ONSET DATE: 07/05/2023  Rationale for Evaluation and Treatment: Rehabilitation  SUBJECTIVE:   SUBJECTIVE STATEMENT: Doing much better. Pt reports a little dizziness with going to the R, but nothing how it was.   Pt accompanied by: self  PERTINENT HISTORY: PMH: HTN, HLD, hepatitis C (treated) with cirrhosis followed by GI, GERD and chronic lower back pain with hx surgery for spinal fixation of L4-L5, OA hands, bipolar affective ds, CTS hands   Per PCP: Reports intermittent dizziness lasting several seconds for the past year. Occurs when she rolls over in  bed, if she turns her head too quickly or if she gets up too quickly.   PAIN:  Are you having pain? "Has arthritis in her shoulder and pain in her side"   Vitals:   07/15/23 1020  BP: 119/78  Pulse: 61     PRECAUTIONS: None   FALLS: Has patient fallen in last 6 months? No  PLOF: Independent and Still driving, but does not go too far due to carpal tunnel and her feet cramping up, not the dizziness   PATIENT GOALS: Wants to get rid of the dizziness   OBJECTIVE:  Note: Objective measures were completed at Evaluation unless otherwise noted.  COGNITION: Overall cognitive status: Within functional limits for tasks assessed  TREATMENT DATE: 07/15/2023  Vitals:   07/15/23 1020  BP: 119/78  Pulse: 61     NMR:   POSITIONAL TESTING: Right Dix-Hallpike: upbeating, right nystagmus and initially intense and that subsides quickly, lasted about 8-10 seconds  Left Dix-Hallpike: upbeating, left nystagmus and more mild, lower amplitude, lasting approx. 8 seconds    VESTIBULAR TREATMENT:  Canalith Repositioning: Epley Right: Number of Reps: 1, Response to Treatment: symptoms resolved, and Comment: pt demonstrated resolution after 1 rep, pt reporting a little dizziness with return to upright   Epley Left: Number of Reps: 1, Response to Treatment: symptoms resolved, and Comment: pt demonstrated resolution after 1 rep, re-assessed L DixHallpike x2 reps with no dizziness or nystagmus   Pt only reporting some dizziness when coming up from DixHallpike position, but described it as more of a headrush   PATIENT EDUCATION: Education details: BPPV education, reoccurrence rates, making sure pt is staying well hydrated  Education on letting things settle for 24 hours as extra safety precaution and then resume all normal activity  Person educated: Patient Education method: Explanation, Demonstration, and Verbal  cues Education comprehension: verbalized understanding  HOME EXERCISE PROGRAM: Will provide at future session as needed   GOALS: Goals reviewed with patient? Yes  SHORT TERM GOALS: ALL STGS = LTGS    LONG TERM GOALS: Target date: 08/05/2023   Pt will demonstrate resolution of bilateral posterior canal BPPV in order to demo decr dizziness for daily life.  Baseline: (+) R and L posterior canalithiasis  Goal status: INITIAL   ASSESSMENT:  CLINICAL IMPRESSION: Today's skilled session focused on re-assessment of R and L posterior canalithiasis. Pt with R and L posterior canalithiasis with re-assessment, but pt not reporting as much dizziness compared to previous session. Pt's nystagmus was brief, but more intense with R side, so treated it first. Pt demonstrating resolution after 1 rep. Also treated the L side with 1 rep of the Epley with pt then demonstrating resolution of BPPV. Pt tolerated well, will re-assess at next session.    OBJECTIVE IMPAIRMENTS: decreased balance and dizziness.   ACTIVITY LIMITATIONS: bending, bed mobility, reach over head, and locomotion level  PARTICIPATION LIMITATIONS: community activity  PERSONAL FACTORS: Age, Behavior pattern, Past/current experiences, Time since onset of injury/illness/exacerbation, and 3+ comorbidities:  HTN, HLD, hepatitis C (treated) with cirrhosis followed by GI, GERD and chronic lower back pain with hx surgery for spinal fixation of L4-L5, OA hands, bipolar affective ds, CTS hands   are also affecting patient's functional outcome.   REHAB POTENTIAL: Good  CLINICAL DECISION MAKING: Stable/uncomplicated  EVALUATION COMPLEXITY: Low   PLAN:  PT FREQUENCY: 1-2x/week  PT DURATION: 4 weeks  PLANNED INTERVENTIONS: 97164- PT Re-evaluation, 97110-Therapeutic exercises, 97530- Therapeutic activity, 97112- Neuromuscular re-education, 97535- Self Care, 09604- Manual therapy, [phone]- Canalith repositioning, Patient/Family education,  Balance training, and Vestibular training  PLAN FOR NEXT SESSION: check for bilateral BPPV and treat as needed (pt had both resolved at end of this session so crossing my fingers it stays that way!!Drake Leach, PT, DPT 07/15/2023, 10:50 AM

## 2023-07-19 ENCOUNTER — Ambulatory Visit: Payer: Medicaid Other | Admitting: Physical Therapy

## 2023-07-19 ENCOUNTER — Encounter: Payer: Self-pay | Admitting: Physical Therapy

## 2023-07-19 VITALS — BP 122/77 | HR 69

## 2023-07-19 DIAGNOSIS — R42 Dizziness and giddiness: Secondary | ICD-10-CM

## 2023-07-19 DIAGNOSIS — H8113 Benign paroxysmal vertigo, bilateral: Secondary | ICD-10-CM | POA: Diagnosis not present

## 2023-07-19 NOTE — Therapy (Signed)
 OUTPATIENT PHYSICAL THERAPY VESTIBULAR TREATMENT     Patient Name: Rachel Hudson MRN: [id number] DOB:08-14-1960, 63 y.o., female Today's Date: 07/19/2023  END OF SESSION:  PT End of Session - 07/19/23 0932     Visit Number 4    Number of Visits 5    Date for PT Re-Evaluation 08/07/23    Authorization Type [employer] MEDICAID    PT Start Time 0931    PT Stop Time 0958   full time not used due to resolution of BPPV   PT Time Calculation (min) 27 min    Activity Tolerance Patient tolerated treatment well    Behavior During Therapy [facility] for tasks assessed/performed             Past Medical History:  Diagnosis Date   ADD (attention deficit disorder)    ADHD    Anxiety    Arthritis    Arthritis    Back pain    Bipolar 1 disorder (HCC)    Cirrhosis of liver (HCC) 01/2014   Stage 4   Foot pain, bilateral    GERD (gastroesophageal reflux disease)    Gout    Hand pain    Hep C w/o coma, chronic (HCC) As of 10/22/13   S/p treatment with Harvoni, achieved SVR   Hyperlipidemia    Hypertension    Joint pain    Liver problem    Osteoarthritis    Other fatigue    Shortness of breath    Shortness of breath on exertion    Vitamin D deficiency    Past Surgical History:  Procedure Laterality Date   BACK SURGERY     lumbar fusion   BIOPSY  09/12/2017   Procedure: BIOPSY;  Surgeon: Corbin Ade, MD;  [facility];  Service: Endoscopy;;  gastric   CARPAL TUNNEL RELEASE Right 11/13/2015   Procedure: RIGHT CARPAL TUNNEL RELEASE;  Surgeon: Betha Loa, MD;  Location: [facility];  Service: Orthopedics;  Laterality: Right;   CARPAL TUNNEL RELEASE Left 01/29/2016   Procedure: left CARPAL TUNNEL RELEASE;  Surgeon: Betha Loa, MD;  Location: [facility];  Service: Orthopedics;  Laterality: Left;   COLONOSCOPY N/A 07/08/2015   Surgeon: Corbin Ade, MD; internal hemorrhoids, colonic diverticulosis.  Repeat in 10 years.    ESOPHAGOGASTRODUODENOSCOPY N/A 07/08/2015   Surgeon: Corbin Ade, MD; erosive reflux esophagitis, hiatal hernia, gastric erosion and ulcer s/p biopsy (reactive gastropathy, negative H. pylori), no esophageal varices or portal hypertension.   ESOPHAGOGASTRODUODENOSCOPY (EGD) WITH PROPOFOL N/A 09/12/2017   Surgeon: Corbin Ade, MD; normal esophagus s/p dilation, portal hypertensive gastropathy, erosive gastropathy biopsied (negative for H. pylori), normal examined duodenum.   ESOPHAGOGASTRODUODENOSCOPY (EGD) WITH PROPOFOL N/A 10/25/2019   Surgeon: Corbin Ade, MD;  normal esophagus s/p dilation, mild erosive reflux esophagitis, portal hypertensive gastropathy, due for repeat in 2023.   ESOPHAGOGASTRODUODENOSCOPY (EGD) WITH PROPOFOL N/A 04/14/2022   Procedure: ESOPHAGOGASTRODUODENOSCOPY (EGD) WITH PROPOFOL;  Surgeon: Corbin Ade, MD;  [facility];  Service: Endoscopy;  Laterality: N/A;  11:15 am, pt knows to arrive at 7:30   FINGER ARTHROSCOPY WITH CARPOMETACARPEL [facility]) ARTHROPLASTY Right 04/24/2020   Procedure: RIGHT INDEX FINGER METACARPAL PHALANGEAL ARTHROPLASTY;  Surgeon: Betha Loa, MD;  [facility];  Service: Orthopedics;  Laterality: Right;   LIGAMENT REPAIR Right 1980   Knee   MALONEY DILATION N/A 09/12/2017   Procedure: Elease Hashimoto DILATION;  Surgeon: Corbin Ade, MD;  [facility];  Service: Endoscopy;  Laterality: N/A;   MALONEY DILATION N/A 10/25/2019   Procedure: Elease Hashimoto DILATION;  Surgeon: Corbin Ade, MD;  [facility];  Service: Endoscopy;  Laterality: N/A;   TRIGGER FINGER RELEASE Right 04/24/2020   Procedure: RIGHT LONG TRIGGER FINGER RELEASE TRIGGER FINGER/A-1 PULLEY;  Surgeon: Betha Loa, MD;  Location: [facility];  Service: Orthopedics;  Laterality: Right;   Patient Active Problem List   Diagnosis Date Noted   Portal hypertension (HCC) 04/23/2022   Marijuana user 03/30/2021    Bipolar 1 disorder (HCC) 01/28/2021   Insulin resistance 01/28/2021   Other hyperlipidemia 12/11/2020   Class 1 obesity with serious comorbidity and body mass index (BMI) of 32.0 to 32.9 in adult 12/03/2020   Influenza vaccine needed 02/26/2020   Bipolar affective disorder, currently manic, mild (HCC) 09/12/2017   Dysphagia 08/24/2017   Neuropathy of both feet 04/05/2017   Status post lumbar spinal fusion 10/22/2016   GERD (gastroesophageal reflux disease) 08/09/2016   Spondylolisthesis of lumbar region 07/29/2016   Sinus bradycardia 06/15/2016   Gingivitis 06/15/2016   ASCUS favor benign 11/13/2015   History of hepatitis C 10/13/2015   Bilateral carpal tunnel syndrome 09/16/2015   Cervical spondylosis without myelopathy 09/16/2015   Primary osteoarthritis of both hands 09/16/2015   Diverticulosis of colon without hemorrhage    Peptic ulcer disease    Spondylosis of lumbar region without myelopathy or radiculopathy 03/21/2015   Lumbago 11/18/2014   Carpal tunnel syndrome 11/18/2014   Hepatic cirrhosis (HCC) 08/06/2014   HTN (hypertension) 02/28/2014    PCP: Marcine Matar, MD REFERRING PROVIDER: Marcine Matar, MD  REFERRING DIAG: H81.10 (ICD-10-CM) - Benign paroxysmal positional vertigo, unspecified laterality  THERAPY DIAG:  BPPV (benign paroxysmal positional vertigo), bilateral  Dizziness and giddiness  ONSET DATE: 07/05/2023  Rationale for Evaluation and Treatment: Rehabilitation  SUBJECTIVE:   SUBJECTIVE STATEMENT: Had a dizziness episode on Sunday and this morning. Notes that with post menopause, always feels a little dizzy at the end of the month. Pt reports that this has been going on for years.   Pt accompanied by: self  PERTINENT HISTORY: PMH: HTN, HLD, hepatitis C (treated) with cirrhosis followed by GI, GERD and chronic lower back pain with hx surgery for spinal fixation of L4-L5, OA hands, bipolar affective ds, CTS hands   Per PCP: Reports  intermittent dizziness lasting several seconds for the past year. Occurs when she rolls over in bed, if she turns her head too quickly or if she gets up too quickly.   PAIN:  Are you having pain? "Has arthritis in her shoulder and pain in her side"   Vitals:   07/19/23 0935  BP: 122/77  Pulse: 69      PRECAUTIONS: None   FALLS: Has patient fallen in last 6 months? No  PLOF: Independent and Still driving, but does not go too far due to carpal tunnel and her feet cramping up, not the dizziness   PATIENT GOALS: Wants to get rid of the dizziness   OBJECTIVE:  Note: Objective measures were completed at Evaluation unless otherwise noted.  COGNITION: Overall cognitive status: Within functional limits for tasks assessed  TREATMENT DATE: 07/19/2023  Vitals:   07/19/23 0935  BP: 122/77  Pulse: 69    NMR:   POSITIONAL TESTING: Right Dix-Hallpike: upbeating, right nystagmus and more intense on this side, lasting approx 10 seconds Left Dix-Hallpike: upbeating, left nystagmus and lasting approx. 8-10 seconds. 2nd assessment before L Epley, pt with very mild nystagmus lasting approx. 5 beats     VESTIBULAR TREATMENT:  Canalith Repositioning: Epley Right: Number of Reps: 1, Response to Treatment: symptoms resolved, and Comment: pt demonstrated resolution after 1 rep, pt reporting a little dizziness with return to upright   Epley Left: Number of Reps: 1, Response to Treatment: symptoms resolved, and Comment: pt demonstrated resolution after 1 rep   Also re-assessed with R and L sidelying positions with pt with no dizziness/nystagmus in sidelying position, just reporting some wooziness with returning upright    Provided pt with Austin Miles exercises for habituation and due to BPPV returning between treatment sessions, hopefully to also help resolve BPPV if it does return, performed 1 rep each side.    PATIENT EDUCATION: Education details: BPPV education, gave pt Austin Miles exercises for home (discussed do not need to perform them today, but can try them tomorrow), adding one more appt for next week  Person educated: Patient Education method: Explanation, Demonstration, Verbal cues, and Handouts Education comprehension: verbalized understanding and returned demonstration  HOME EXERCISE PROGRAM: Access Code: [email or web address] URL: [email or web address] Date: 07/19/2023 Prepared by: Sherlie Ban  Exercises - Brandt-Daroff Vestibular Exercise  - 1 x daily - 7 x weekly - 4-5 reps  GOALS: Goals reviewed with patient? Yes  SHORT TERM GOALS: ALL STGS = LTGS    LONG TERM GOALS: Target date: 08/05/2023   Pt will demonstrate resolution of bilateral posterior canal BPPV in order to demo decr dizziness for daily life.  Baseline: (+) R and L posterior canalithiasis  Goal status: INITIAL   ASSESSMENT:  CLINICAL IMPRESSION: Today's skilled session focused on re-assessment of R and L posterior canalithiasis. Pt with R and L posterior canalithiasis with re-assessment, pt's nystagmus was more intense with R side, so treated it first. Pt demonstrating resolution after 1 rep. Also treated the L side with 1 rep of the Epley with pt then demonstrating resolution of BPPV after 1 rep. Pt tolerated well, will re-assess at next session. Due to BPPV returning between treatment sessions, provided pt with Austin Miles for home and with habituation with return to upright. Will continue per POC.    OBJECTIVE IMPAIRMENTS: decreased balance and dizziness.   ACTIVITY LIMITATIONS: bending, bed mobility, reach over head, and locomotion level  PARTICIPATION LIMITATIONS: community activity  PERSONAL FACTORS: Age, Behavior pattern, Past/current experiences, Time since onset of injury/illness/exacerbation, and 3+ comorbidities:  HTN, HLD, hepatitis C (treated) with cirrhosis followed by GI, GERD  and chronic lower back pain with hx surgery for spinal fixation of L4-L5, OA hands, bipolar affective ds, CTS hands   are also affecting patient's functional outcome.   REHAB POTENTIAL: Good  CLINICAL DECISION MAKING: Stable/uncomplicated  EVALUATION COMPLEXITY: Low   PLAN:  PT FREQUENCY: 1-2x/week  PT DURATION: 4 weeks  PLANNED INTERVENTIONS: 97164- PT Re-evaluation, 97110-Therapeutic exercises, 97530- Therapeutic activity, 97112- Neuromuscular re-education, 97535- Self Care, 62952- Manual therapy, [phone]- Canalith repositioning, Patient/Family education, Balance training, and Vestibular training  PLAN FOR NEXT SESSION: check for bilateral BPPV and treat as needed (pt had both resolved at end of this session so crossing my fingers it stays that way!)   Drake Leach, PT, DPT 07/19/2023,  10:07 AM

## 2023-07-21 ENCOUNTER — Encounter: Payer: Self-pay | Admitting: Physical Therapy

## 2023-07-21 ENCOUNTER — Ambulatory Visit: Payer: Medicaid Other | Admitting: Physical Therapy

## 2023-07-21 VITALS — BP 140/80 | HR 70

## 2023-07-21 DIAGNOSIS — H8113 Benign paroxysmal vertigo, bilateral: Secondary | ICD-10-CM

## 2023-07-21 DIAGNOSIS — R42 Dizziness and giddiness: Secondary | ICD-10-CM

## 2023-07-21 NOTE — Therapy (Signed)
 OUTPATIENT PHYSICAL THERAPY VESTIBULAR TREATMENT / RE-CERT     Patient Name: Rachel Hudson MRN: [id number] DOB:July 12, 1960, 63 y.o., female Today's Date: 07/21/2023  END OF SESSION:  PT End of Session - 07/21/23 0936     Visit Number 5    Number of Visits 11    Date for PT Re-Evaluation 08/18/23    Authorization Type [employer] MEDICAID    PT Start Time 0935    PT Stop Time 0955    PT Time Calculation (min) 20 min    Equipment Utilized During Treatment Gait belt    Activity Tolerance Patient tolerated treatment well    Behavior During Therapy WFL for tasks assessed/performed             Past Medical History:  Diagnosis Date   ADD (attention deficit disorder)    ADHD    Anxiety    Arthritis    Arthritis    Back pain    Bipolar 1 disorder (HCC)    Cirrhosis of liver (HCC) 01/2014   Stage 4   Foot pain, bilateral    GERD (gastroesophageal reflux disease)    Gout    Hand pain    Hep C w/o coma, chronic (HCC) As of 10/22/13   S/p treatment with Harvoni, achieved SVR   Hyperlipidemia    Hypertension    Joint pain    Liver problem    Osteoarthritis    Other fatigue    Shortness of breath    Shortness of breath on exertion    Vitamin D deficiency    Past Surgical History:  Procedure Laterality Date   BACK SURGERY     lumbar fusion   BIOPSY  09/12/2017   Procedure: BIOPSY;  Surgeon: Corbin Ade, MD;  [facility];  Service: Endoscopy;;  gastric   CARPAL TUNNEL RELEASE Right 11/13/2015   Procedure: RIGHT CARPAL TUNNEL RELEASE;  Surgeon: Betha Loa, MD;  Location: [facility];  Service: Orthopedics;  Laterality: Right;   CARPAL TUNNEL RELEASE Left 01/29/2016   Procedure: left CARPAL TUNNEL RELEASE;  Surgeon: Betha Loa, MD;  Location: [facility];  Service: Orthopedics;  Laterality: Left;   COLONOSCOPY N/A 07/08/2015   Surgeon: Corbin Ade, MD; internal hemorrhoids, colonic diverticulosis.  Repeat in 10 years.    ESOPHAGOGASTRODUODENOSCOPY N/A 07/08/2015   Surgeon: Corbin Ade, MD; erosive reflux esophagitis, hiatal hernia, gastric erosion and ulcer s/p biopsy (reactive gastropathy, negative H. pylori), no esophageal varices or portal hypertension.   ESOPHAGOGASTRODUODENOSCOPY (EGD) WITH PROPOFOL N/A 09/12/2017   Surgeon: Corbin Ade, MD; normal esophagus s/p dilation, portal hypertensive gastropathy, erosive gastropathy biopsied (negative for H. pylori), normal examined duodenum.   ESOPHAGOGASTRODUODENOSCOPY (EGD) WITH PROPOFOL N/A 10/25/2019   Surgeon: Corbin Ade, MD;  normal esophagus s/p dilation, mild erosive reflux esophagitis, portal hypertensive gastropathy, due for repeat in 2023.   ESOPHAGOGASTRODUODENOSCOPY (EGD) WITH PROPOFOL N/A 04/14/2022   Procedure: ESOPHAGOGASTRODUODENOSCOPY (EGD) WITH PROPOFOL;  Surgeon: Corbin Ade, MD;  [facility];  Service: Endoscopy;  Laterality: N/A;  11:15 am, pt knows to arrive at 7:30   FINGER ARTHROSCOPY WITH CARPOMETACARPEL [facility]) ARTHROPLASTY Right 04/24/2020   Procedure: RIGHT INDEX FINGER METACARPAL PHALANGEAL ARTHROPLASTY;  Surgeon: Betha Loa, MD;  Location: [facility];  Service: Orthopedics;  Laterality: Right;   LIGAMENT REPAIR Right 1980   Knee   MALONEY DILATION N/A 09/12/2017   Procedure: Elease Hashimoto DILATION;  Surgeon: Corbin Ade, MD;  [facility];  Service: Endoscopy;  Laterality: N/A;   MALONEY DILATION N/A 10/25/2019   Procedure: Elease Hashimoto DILATION;  Surgeon: Corbin Ade, MD;  [facility];  Service: Endoscopy;  Laterality: N/A;   TRIGGER FINGER RELEASE Right 04/24/2020   Procedure: RIGHT LONG TRIGGER FINGER RELEASE TRIGGER FINGER/A-1 PULLEY;  Surgeon: Betha Loa, MD;  Location: [facility];  Service: Orthopedics;  Laterality: Right;   Patient Active Problem List   Diagnosis Date Noted   Portal hypertension (HCC) 04/23/2022   Marijuana user 03/30/2021    Bipolar 1 disorder (HCC) 01/28/2021   Insulin resistance 01/28/2021   Other hyperlipidemia 12/11/2020   Class 1 obesity with serious comorbidity and body mass index (BMI) of 32.0 to 32.9 in adult 12/03/2020   Influenza vaccine needed 02/26/2020   Bipolar affective disorder, currently manic, mild (HCC) 09/12/2017   Dysphagia 08/24/2017   Neuropathy of both feet 04/05/2017   Status post lumbar spinal fusion 10/22/2016   GERD (gastroesophageal reflux disease) 08/09/2016   Spondylolisthesis of lumbar region 07/29/2016   Sinus bradycardia 06/15/2016   Gingivitis 06/15/2016   ASCUS favor benign 11/13/2015   History of hepatitis C 10/13/2015   Bilateral carpal tunnel syndrome 09/16/2015   Cervical spondylosis without myelopathy 09/16/2015   Primary osteoarthritis of both hands 09/16/2015   Diverticulosis of colon without hemorrhage    Peptic ulcer disease    Spondylosis of lumbar region without myelopathy or radiculopathy 03/21/2015   Lumbago 11/18/2014   Carpal tunnel syndrome 11/18/2014   Hepatic cirrhosis (HCC) 08/06/2014   HTN (hypertension) 02/28/2014    PCP: Marcine Matar, MD REFERRING PROVIDER: Marcine Matar, MD  REFERRING DIAG: H81.10 (ICD-10-CM) - Benign paroxysmal positional vertigo, unspecified laterality  THERAPY DIAG:  BPPV (benign paroxysmal positional vertigo), bilateral - Plan: PT plan of care cert/re-cert  Dizziness and giddiness - Plan: PT plan of care cert/re-cert  ONSET DATE: 07/05/2023  Rationale for Evaluation and Treatment: Rehabilitation  SUBJECTIVE:   SUBJECTIVE STATEMENT: Reports only mild dizziness. States she has been doing exercises, Denies falls and near falls.   Pt accompanied by: self  PERTINENT HISTORY: PMH: HTN, HLD, hepatitis C (treated) with cirrhosis followed by GI, GERD and chronic lower back pain with hx surgery for spinal fixation of L4-L5, OA hands, bipolar affective ds, CTS hands   Per PCP: Reports intermittent dizziness  lasting several seconds for the past year. Occurs when she rolls over in bed, if she turns her head too quickly or if she gets up too quickly.   PAIN:  Are you having pain? "Has arthritis in her shoulder and pain in her side"   Vitals:   07/21/23 0938  BP: (!) 140/80  Pulse: 70    PRECAUTIONS: None   FALLS: Has patient fallen in last 6 months? No  PLOF: Independent and Still driving, but does not go too far due to carpal tunnel and her feet cramping up, not the dizziness   PATIENT GOALS: Wants to get rid of the dizziness   OBJECTIVE:  Note: Objective measures were completed at Evaluation unless otherwise noted.  COGNITION: Overall cognitive status: Within functional limits for tasks assessed  TREATMENT DATE: 07/21/2023  Vitals:   07/21/23 0938  BP: (!) 140/80  Pulse: 70   Mildly elevated but WFL for therapy  NMR:   POSITIONAL TESTING: Right Dix-Hallpike: upbeating, right nystagmus and more intense on this side, lasting approx 8 seconds, presenting with rapid onset Left Dix-Hallpike: upbeating, left nystagmus and lasting approx. 8 seconds. 2nd assessment before L Epley and presentation the same     VESTIBULAR TREATMENT:  Canalith Repositioning: Epley Right: Number of Reps: 1, Response to Treatment: symptoms resolved, and Comment: pt demonstrated resolution after 1 rep, pt reporting a little dizziness with return to upright   Epley Left: Number of Reps: 1, Response to Treatment: symptoms resolved, and Comment: pt demonstrated resolution after 1 rep    Recommend re-certing given reoccurrence of BPPV from prior session, continue to educate on continuing Brandt-Deroff ,   PATIENT EDUCATION: Education details: Continue HEP Person educated: Patient Education method: Programmer, multimedia, Demonstration, Verbal cues, and Handouts Education comprehension: verbalized understanding and returned  demonstration  HOME EXERCISE PROGRAM: Access Code: [email or web address] URL: [email or web address] Date: 07/19/2023 Prepared by: Sherlie Ban  Exercises - Brandt-Daroff Vestibular Exercise  - 1 x daily - 7 x weekly - 4-5 reps  GOALS: Goals reviewed with patient? Yes  SHORT TERM GOALS: ALL STGS = LTGS    LONG TERM GOALS: Target date: (updated to 08/18/2023)   Pt will demonstrate resolution of bilateral posterior canal BPPV in order to demo decr dizziness for daily life.  Baseline: (+) R and L posterior canalithiasis  Goal status:IN PROGRESS (given re-occurrence will rec-cert)   ASSESSMENT:  CLINICAL IMPRESSION: Today's skilled session focused on re-assessment of R and L posterior canalithiasis. Pt with R and L posterior canalithiasis continuing R>L even with initiation of Brandt-Deroff at home. Advised continuing HEP for management and will re-cert to re-assess in future in session given reoccurrence. Appeared resolved by end of this session. Will continue per POC.    OBJECTIVE IMPAIRMENTS: decreased balance and dizziness.   ACTIVITY LIMITATIONS: bending, bed mobility, reach over head, and locomotion level  PARTICIPATION LIMITATIONS: community activity  PERSONAL FACTORS: Age, Behavior pattern, Past/current experiences, Time since onset of injury/illness/exacerbation, and 3+ comorbidities:  HTN, HLD, hepatitis C (treated) with cirrhosis followed by GI, GERD and chronic lower back pain with hx surgery for spinal fixation of L4-L5, OA hands, bipolar affective ds, CTS hands   are also affecting patient's functional outcome.   REHAB POTENTIAL: Good  CLINICAL DECISION MAKING: Stable/uncomplicated  EVALUATION COMPLEXITY: Low   PLAN:  PT FREQUENCY: 1-2x/week + 1-2x/week   PT DURATION: 4 weeks + 4 weeks  PLANNED INTERVENTIONS: 97164- PT Re-evaluation, 97110-Therapeutic exercises, 97530- Therapeutic activity, 97112- Neuromuscular re-education, 97535- Self Care, 54098-  Manual therapy, [phone]- Canalith repositioning, Patient/Family education, Balance training, and Vestibular training  PLAN FOR NEXT SESSION: check for bilateral BPPV and treat as needed (pt had both resolved at end of this session so crossing my fingers it stays that way!)  Add more visits as indicated    Carmelia Bake, PT, DPT 07/21/2023, 10:08 AM

## 2023-07-25 ENCOUNTER — Ambulatory Visit: Payer: Self-pay | Admitting: Physical Therapy

## 2023-08-04 ENCOUNTER — Encounter: Payer: Self-pay | Admitting: Physical Therapy

## 2023-08-10 ENCOUNTER — Ambulatory Visit: Payer: Self-pay | Attending: Internal Medicine | Admitting: Physical Therapy

## 2023-08-10 ENCOUNTER — Encounter: Payer: Self-pay | Admitting: Physical Therapy

## 2023-08-10 VITALS — BP 127/75 | HR 64

## 2023-08-10 DIAGNOSIS — H8113 Benign paroxysmal vertigo, bilateral: Secondary | ICD-10-CM | POA: Insufficient documentation

## 2023-08-10 DIAGNOSIS — R42 Dizziness and giddiness: Secondary | ICD-10-CM | POA: Insufficient documentation

## 2023-08-10 NOTE — Therapy (Signed)
 OUTPATIENT PHYSICAL THERAPY VESTIBULAR TREATMENT / DISCHARGE    PHYSICAL THERAPY DISCHARGE SUMMARY  Visits from Start of Care: 6  Current functional level related to goals / functional outcomes: See below for details    Remaining deficits: Recommend follow up with ENT given reoccurrence of symptom presentation    Education / Equipment: When to return to PT if symptoms continue to worsen  Patient agrees to discharge. Patient goals were met. Patient is being discharged due to meeting the stated rehab goals.  Patient Name: Rachel Hudson MRN: [id number] DOB:July 24, 1960, 63 y.o., female Today's Date: 08/10/2023  END OF SESSION:  PT End of Session - 08/10/23 1018     Visit Number 6    Number of Visits 11    Date for PT Re-Evaluation 08/18/23    Authorization Type [employer] MEDICAID    PT Start Time 1017    PT Stop Time 1038    PT Time Calculation (min) 21 min    Equipment Utilized During Treatment Gait belt    Activity Tolerance Patient tolerated treatment well    Behavior During Therapy WFL for tasks assessed/performed             Past Medical History:  Diagnosis Date   ADD (attention deficit disorder)    ADHD    Anxiety    Arthritis    Arthritis    Back pain    Bipolar 1 disorder (HCC)    Cirrhosis of liver (HCC) 01/2014   Stage 4   Foot pain, bilateral    GERD (gastroesophageal reflux disease)    Gout    Hand pain    Hep C w/o coma, chronic (HCC) As of 10/22/13   S/p treatment with Harvoni, achieved SVR   Hyperlipidemia    Hypertension    Joint pain    Liver problem    Osteoarthritis    Other fatigue    Shortness of breath    Shortness of breath on exertion    Vitamin D deficiency    Past Surgical History:  Procedure Laterality Date   BACK SURGERY     lumbar fusion   BIOPSY  09/12/2017   Procedure: BIOPSY;  Surgeon: Corbin Ade, MD;  [facility];  Service: Endoscopy;;  gastric   CARPAL TUNNEL RELEASE Right 11/13/2015   Procedure:  RIGHT CARPAL TUNNEL RELEASE;  Surgeon: Betha Loa, MD;  Location: [facility];  Service: Orthopedics;  Laterality: Right;   CARPAL TUNNEL RELEASE Left 01/29/2016   Procedure: left CARPAL TUNNEL RELEASE;  Surgeon: Betha Loa, MD;  [facility];  Service: Orthopedics;  Laterality: Left;   COLONOSCOPY N/A 07/08/2015   Surgeon: Corbin Ade, MD; internal hemorrhoids, colonic diverticulosis.  Repeat in 10 years.   ESOPHAGOGASTRODUODENOSCOPY N/A 07/08/2015   Surgeon: Corbin Ade, MD; erosive reflux esophagitis, hiatal hernia, gastric erosion and ulcer s/p biopsy (reactive gastropathy, negative H. pylori), no esophageal varices or portal hypertension.   ESOPHAGOGASTRODUODENOSCOPY (EGD) WITH PROPOFOL N/A 09/12/2017   Surgeon: Corbin Ade, MD; normal esophagus s/p dilation, portal hypertensive gastropathy, erosive gastropathy biopsied (negative for H. pylori), normal examined duodenum.   ESOPHAGOGASTRODUODENOSCOPY (EGD) WITH PROPOFOL N/A 10/25/2019   Surgeon: Corbin Ade, MD;  normal esophagus s/p dilation, mild erosive reflux esophagitis, portal hypertensive gastropathy, due for repeat in 2023.   ESOPHAGOGASTRODUODENOSCOPY (EGD) WITH PROPOFOL N/A 04/14/2022   Procedure: ESOPHAGOGASTRODUODENOSCOPY (EGD) WITH PROPOFOL;  Surgeon: Corbin Ade, MD;  [facility];  Service: Endoscopy;  Laterality: N/A;  11:15 am, pt knows to arrive at 7:30   FINGER ARTHROSCOPY WITH CARPOMETACARPEL [facility]) ARTHROPLASTY Right 04/24/2020   Procedure: RIGHT INDEX FINGER METACARPAL PHALANGEAL ARTHROPLASTY;  Surgeon: Betha Loa, MD;  Location: [facility];  Service: Orthopedics;  Laterality: Right;   LIGAMENT REPAIR Right 1980   Knee   MALONEY DILATION N/A 09/12/2017   Procedure: Elease Hashimoto DILATION;  Surgeon: Corbin Ade, MD;  [facility];  Service: Endoscopy;  Laterality: N/A;   MALONEY DILATION N/A 10/25/2019   Procedure: Elease Hashimoto  DILATION;  Surgeon: Corbin Ade, MD;  [facility];  Service: Endoscopy;  Laterality: N/A;   TRIGGER FINGER RELEASE Right 04/24/2020   Procedure: RIGHT LONG TRIGGER FINGER RELEASE TRIGGER FINGER/A-1 PULLEY;  Surgeon: Betha Loa, MD;  Location: [facility];  Service: Orthopedics;  Laterality: Right;   Patient Active Problem List   Diagnosis Date Noted   Portal hypertension (HCC) 04/23/2022   Marijuana user 03/30/2021   Bipolar 1 disorder (HCC) 01/28/2021   Insulin resistance 01/28/2021   Other hyperlipidemia 12/11/2020   Class 1 obesity with serious comorbidity and body mass index (BMI) of 32.0 to 32.9 in adult 12/03/2020   Influenza vaccine needed 02/26/2020   Bipolar affective disorder, currently manic, mild (HCC) 09/12/2017   Dysphagia 08/24/2017   Neuropathy of both feet 04/05/2017   Status post lumbar spinal fusion 10/22/2016   GERD (gastroesophageal reflux disease) 08/09/2016   Spondylolisthesis of lumbar region 07/29/2016   Sinus bradycardia 06/15/2016   Gingivitis 06/15/2016   ASCUS favor benign 11/13/2015   History of hepatitis C 10/13/2015   Bilateral carpal tunnel syndrome 09/16/2015   Cervical spondylosis without myelopathy 09/16/2015   Primary osteoarthritis of both hands 09/16/2015   Diverticulosis of colon without hemorrhage    Peptic ulcer disease    Spondylosis of lumbar region without myelopathy or radiculopathy 03/21/2015   Lumbago 11/18/2014   Carpal tunnel syndrome 11/18/2014   Hepatic cirrhosis (HCC) 08/06/2014   HTN (hypertension) 02/28/2014    PCP: Marcine Matar, MD REFERRING PROVIDER: Marcine Matar, MD  REFERRING DIAG: H81.10 (ICD-10-CM) - Benign paroxysmal positional vertigo, unspecified laterality  THERAPY DIAG:  BPPV (benign paroxysmal positional vertigo), bilateral  Dizziness and giddiness  ONSET DATE: 07/05/2023  Rationale for Evaluation and Treatment: Rehabilitation  SUBJECTIVE:   SUBJECTIVE  STATEMENT: Reports overall much better. She reports that this morning she had a very vague dizzy feeling but it was very mild. She denies falls and near falls.   Pt accompanied by: self  PERTINENT HISTORY: PMH: HTN, HLD, hepatitis C (treated) with cirrhosis followed by GI, GERD and chronic lower back pain with hx surgery for spinal fixation of L4-L5, OA hands, bipolar affective ds, CTS hands   Per PCP: Reports intermittent dizziness lasting several seconds for the past year. Occurs when she rolls over in bed, if she turns her head too quickly or if she gets up too quickly.   PAIN:  Are you having pain? "Has arthritis in her shoulder and pain in her side"   Vitals:   08/10/23 1023  BP: 127/75  Pulse: 64     PRECAUTIONS: None   FALLS: Has patient fallen in last 6 months? No  PLOF: Independent and Still driving, but does not go too far due to carpal tunnel and her feet cramping up, not the dizziness   PATIENT GOALS: Wants to get rid of the dizziness   OBJECTIVE:  Note: Objective measures were completed at  Evaluation unless otherwise noted.  COGNITION: Overall cognitive status: Within functional limits for tasks assessed                                                                                  TREATMENT DATE: 07/21/2023  Vitals:   08/10/23 1023  BP: 127/75  Pulse: 64   WFL limits for therapy, taken at rest on RUE  NMR:   POSITIONAL TESTING:  Right Dix-Hallpike: R torsional nystagmus with slight upbeat though very mild Left Dix-Hallpike: none  VESTIBULAR TREATMENT:  Canalith Repositioning: Epley Right: Number of Reps: 1, Response to Treatment: symptoms resolved, and Comment: pt demonstrated resolution after 1 rep, pt reporting a little dizziness with return to upright, rechecked positional 2x and none noted   Given same presentation and disappearance of nystagmus on R side would recommend ENT for second opinion to ensure no other explanation beyond positional  testing as reoccurrence on this level unusual, recommend D/C today and patient in agreement   PATIENT EDUCATION: Education details: Continue HEP Person educated: Patient Education method: Programmer, multimedia, Demonstration, Verbal cues, and Handouts Education comprehension: verbalized understanding and returned demonstration  HOME EXERCISE PROGRAM: Access Code: [email or web address] URL: [email or web address] Date: 07/19/2023 Prepared by: Sherlie Ban  Exercises - Brandt-Daroff Vestibular Exercise  - 1 x daily - 7 x weekly - 4-5 reps  GOALS: Goals reviewed with patient? Yes  SHORT TERM GOALS: ALL STGS = LTGS    LONG TERM GOALS: Target date: (updated to 08/18/2023)   Pt will demonstrate resolution of bilateral posterior canal BPPV in order to demo decr dizziness for daily life.  Baseline: (+) R and L posterior canalithiasis  Goal status: MET  ASSESSMENT:  CLINICAL IMPRESSION: Patient being discharged from skilled physical therapy due to clear positional testing by end of session. Patient still briefly presenting with R posterior canal nystagmus that would be most suspicious for posterior canalithiasis that clears with positional testing 1x Epley maneuver during session (clear by end); however, given reoccurrence and same presentation between each visit, physical therapist advises follow up with ENT to rule out any other plausible explanation for symptom presentation versus patient just being susceptible to reoccurrence. Patient is agreeable to discharge at this time.   OBJECTIVE IMPAIRMENTS: decreased balance and dizziness.   ACTIVITY LIMITATIONS: bending, bed mobility, reach over head, and locomotion level  PARTICIPATION LIMITATIONS: community activity  PERSONAL FACTORS: Age, Behavior pattern, Past/current experiences, Time since onset of injury/illness/exacerbation, and 3+ comorbidities:  HTN, HLD, hepatitis C (treated) with cirrhosis followed by GI, GERD and chronic lower back  pain with hx surgery for spinal fixation of L4-L5, OA hands, bipolar affective ds, CTS hands   are also affecting patient's functional outcome.   REHAB POTENTIAL: Good  CLINICAL DECISION MAKING: Stable/uncomplicated  EVALUATION COMPLEXITY: Low   PLAN:  PT FREQUENCY: 1-2x/week + 1-2x/week   PT DURATION: 4 weeks + 4 weeks  PLANNED INTERVENTIONS: 97164- PT Re-evaluation, 97110-Therapeutic exercises, 97530- Therapeutic activity, 97112- Neuromuscular re-education, 97535- Self Care, 29528- Manual therapy, [phone]- Canalith repositioning, Patient/Family education, Balance training, and Vestibular training  PLAN FOR NEXT SESSION: NA - D/C with updated POC   Carmelia Bake, PT, DPT 08/10/2023, 10:54 AM

## 2023-08-17 ENCOUNTER — Other Ambulatory Visit: Payer: Self-pay | Admitting: Nurse Practitioner

## 2023-10-25 ENCOUNTER — Encounter: Payer: Self-pay | Admitting: Gastroenterology

## 2023-11-03 ENCOUNTER — Ambulatory Visit: Payer: Medicaid Other | Attending: Internal Medicine | Admitting: Internal Medicine

## 2023-11-03 ENCOUNTER — Encounter: Payer: Self-pay | Admitting: Internal Medicine

## 2023-11-03 VITALS — BP 111/68 | HR 62 | Resp 20 | Ht 61.0 in | Wt 163.4 lb

## 2023-11-03 DIAGNOSIS — G629 Polyneuropathy, unspecified: Secondary | ICD-10-CM

## 2023-11-03 DIAGNOSIS — E785 Hyperlipidemia, unspecified: Secondary | ICD-10-CM | POA: Diagnosis not present

## 2023-11-03 DIAGNOSIS — I10 Essential (primary) hypertension: Secondary | ICD-10-CM | POA: Diagnosis not present

## 2023-11-03 DIAGNOSIS — F1290 Cannabis use, unspecified, uncomplicated: Secondary | ICD-10-CM | POA: Diagnosis not present

## 2023-11-03 MED ORDER — DULOXETINE HCL 20 MG PO CPEP
20.0000 mg | ORAL_CAPSULE | Freq: Every day | ORAL | 3 refills | Status: DC
Start: 2023-11-03 — End: 2024-03-12

## 2023-11-03 NOTE — Progress Notes (Signed)
 Patient ID: Rachel Hudson, female    DOB: 1960/09/14  MRN: [id number]  CC: Chronic ds management   Subjective: Rachel Hudson is a 63 y.o. female who presents for chronic ds management. Her concerns today include:  Patient with history of HTN, HL, hepatitis C (treated) with cirrhosis followed by GI, GERD and chronic lower back pain with hx surgery for spinal fixation of L4-L5, OA hands, bipolar affective ds, CTS hands.    Discussed the use of AI scribe software for clinical note transcription with the patient, who gave verbal consent to proceed.  History of Present Illness   Rachel Hudson is a 63 year old female with hypertension and cirrhosis who presents for follow-up of her chronic conditions.  HTN: She takes amlodipine  10 mg daily for hypertension and limits her salt intake. She experiences occasional shortness of breath without chest pain. She does not monitor her blood pressure at home.  She uses marijuana regularly which she feels help with arthritis pain and calming has effects.  Gets it from a dispensary.  Would feel hyperactive otherwise.  History of bipolar.  HL: She takes lovastatin  10 mg daily for cholesterol. Her liver function tests in January were normal. She has cirrhosis and undergoes liver ultrasounds every six months, with the last one in January showing no signs of HCC.  She experiences a burning sensation in the instep of both feet, described as 'walking in fire,' which started one to two months ago.  She is on gabapentin  800 mg to take 1 tab every 6 hours for her chronic neck and back pain.  However she started taking 2 tablets every 6 hours because of the burning.   The burning is intermittent and accompanied by tingling and numbness, particularly in the first and second toes of both feet. Numbness has been present since foot surgery two to three years ago. Her blood sugars have been normal, and she has no history of diabetes. She is active but finds it  difficult to keep up due to the burning sensation in her feet.       Patient Active Problem List   Diagnosis Date Noted   Portal hypertension (HCC) 04/23/2022   Marijuana user 03/30/2021   Bipolar 1 disorder (HCC) 01/28/2021   Insulin  resistance 01/28/2021   Other hyperlipidemia 12/11/2020   Class 1 obesity with serious comorbidity and body mass index (BMI) of 32.0 to 32.9 in adult 12/03/2020   Influenza vaccine needed 02/26/2020   Bipolar affective disorder, currently manic, mild (HCC) 09/12/2017   Dysphagia 08/24/2017   Neuropathy of both feet 04/05/2017   Status post lumbar spinal fusion 10/22/2016   GERD (gastroesophageal reflux disease) 08/09/2016   Spondylolisthesis of lumbar region 07/29/2016   Sinus bradycardia 06/15/2016   Gingivitis 06/15/2016   ASCUS favor benign 11/13/2015   History of hepatitis C 10/13/2015   Bilateral carpal tunnel syndrome 09/16/2015   Cervical spondylosis without myelopathy 09/16/2015   Primary osteoarthritis of both hands 09/16/2015   Diverticulosis of colon without hemorrhage    Peptic ulcer disease    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     Cholecalciferol (VITAMIN D-3 PO) Take 1 tablet by mouth daily.     colchicine  0.6 MG tablet Take 1 tablet (0.6 mg total) by mouth 2 (two) times daily. 180 tablet 1   COLLAGEN PO Take 1 Scoop by mouth in the morning, at noon, in the evening, and at bedtime.     diclofenac  (VOLTAREN ) 75 MG EC tablet Take 1 tablet (75 mg total) by mouth daily. 30 tablet 4   gabapentin  (NEURONTIN ) 800 MG tablet TAKE 1  TABLET BY MOUTH EVERY 6 HOURS 120 tablet 5   lovastatin  (MEVACOR ) 10 MG tablet Take 1 tablet by mouth once daily 90 tablet 0   MILK THISTLE PO Take 175 mg by mouth daily.     Multiple Vitamin (MULTIVITAMIN WITH MINERALS) TABS tablet Take 1 tablet by mouth daily. Centrum Multivitamin     Nutritional Supplements (LIVER DEFENSE PO) Take 2 tablets by mouth in the morning. Liverite Liver Aid     Omega-3 Fatty Acids (FISH OIL PO) Take 1 capsule by mouth in the morning.     pantoprazole  (PROTONIX ) 40 MG tablet Take 1-2 tablets (40-80 mg total) by mouth daily before breakfast. Take one tablet once to twice daily before a meal as needed for acid reflux. 180 tablet 1   No current facility-administered medications on file prior to visit.    Allergies  Allergen Reactions   Penicillins     Facial swelling Has patient had a PCN reaction causing immediate rash, facial/tongue/throat swelling, SOB or lightheadedness with hypotension: Yes Has patient had a PCN reaction causing severe rash involving mucus membranes or skin necrosis: No Has patient had a PCN reaction that required hospitalization No Has patient had a PCN reaction occurring within the last 10 years: Yes If all of the above answers are NO, then may proceed with Cephalosporin use.    Tylenol  [Acetaminophen ] Other (See Comments)    HX of Hep. C    Social History   Socioeconomic History   Marital status: Single    Spouse name: Not on file   Number of children: Not on file   Years of education: Not on file   Highest education level: Not on file  Occupational History   Occupation: disabled  Tobacco Use   Smoking status: Never   Smokeless tobacco: Never  Vaping Use   Vaping status: Never Used  Substance and Sexual Activity   Alcohol use: Not Currently   Drug use: Yes    Frequency: 7.0 times per week    Types: Marijuana    Comment: daily for arthritis   Sexual activity: Not Currently    Partners: Male    Birth  control/protection: None, Condom  Other Topics Concern   Not on file  Social History Narrative   Not on file   Social Drivers of Health   Financial Resource Strain: High Risk (04/08/2023)   Overall Financial Resource Strain (CARDIA)    Difficulty of Paying Living Expenses: Very hard  Food Insecurity: Food Insecurity Present (04/08/2023)   Hunger Vital Sign    Worried About Running Out of Food in the Last Year: Sometimes true    Ran Out of Food in the Last Year: Often true  Transportation Needs: No Transportation Needs (04/08/2023)   PRAPARE - Administrator, Civil Service (Medical): No    Lack of Transportation (Non-Medical): No  Physical Activity: Sufficiently Active (04/08/2023)   Exercise Vital Sign    Days  of Exercise per Week: 7 days    Minutes of Exercise per Session: 30 min  Stress: No Stress Concern Present (04/08/2023)   [employer] of Occupational Health - Occupational Stress Questionnaire    Feeling of Stress : Not at all  Social Connections: Socially Isolated (04/08/2023)   Social Connection and Isolation Panel    Frequency of Communication with Friends and Family: Never    Frequency of Social Gatherings with Friends and Family: Never    Attends Religious Services: More than 4 times per year    Active Member of [employer] or Organizations: No    Attends Banker Meetings: Never    Marital Status: Never married  Intimate Partner Violence: Not At Risk (04/08/2023)   Humiliation, Afraid, Rape, and Kick questionnaire    Fear of Current or Ex-Partner: No    Emotionally Abused: No    Physically Abused: No    Sexually Abused: No    Family History  Adopted: Yes  Problem Relation Age of Onset   Colon cancer Neg Hx    Breast cancer Neg Hx     Past Surgical History:  Procedure Laterality Date   BACK SURGERY     lumbar fusion   BIOPSY  09/12/2017   Procedure: BIOPSY;  Surgeon: Suzette Espy, MD;  [facility];  Service:  Endoscopy;;  gastric   CARPAL TUNNEL RELEASE Right 11/13/2015   Procedure: RIGHT CARPAL TUNNEL RELEASE;  Surgeon: Brunilda Capra, MD;  Location: [facility];  Service: Orthopedics;  Laterality: Right;   CARPAL TUNNEL RELEASE Left 01/29/2016   Procedure: left CARPAL TUNNEL RELEASE;  Surgeon: Brunilda Capra, MD;  Location: [facility];  Service: Orthopedics;  Laterality: Left;   COLONOSCOPY N/A 07/08/2015   Surgeon: Suzette Espy, MD; internal hemorrhoids, colonic diverticulosis.  Repeat in 10 years.   ESOPHAGOGASTRODUODENOSCOPY N/A 07/08/2015   Surgeon: Suzette Espy, MD; erosive reflux esophagitis, hiatal hernia, gastric erosion and ulcer s/p biopsy (reactive gastropathy, negative H. pylori), no esophageal varices or portal hypertension.   ESOPHAGOGASTRODUODENOSCOPY (EGD) WITH PROPOFOL  N/A 09/12/2017   Surgeon: Suzette Espy, MD; normal esophagus s/p dilation, portal hypertensive gastropathy, erosive gastropathy biopsied (negative for H. pylori), normal examined duodenum.   ESOPHAGOGASTRODUODENOSCOPY (EGD) WITH PROPOFOL  N/A 10/25/2019   Surgeon: Suzette Espy, MD;  normal esophagus s/p dilation, mild erosive reflux esophagitis, portal hypertensive gastropathy, due for repeat in 2023.   ESOPHAGOGASTRODUODENOSCOPY (EGD) WITH PROPOFOL  N/A 04/14/2022   Procedure: ESOPHAGOGASTRODUODENOSCOPY (EGD) WITH PROPOFOL ;  Surgeon: Suzette Espy, MD;  Location: [facility];  Service: Endoscopy;  Laterality: N/A;  11:15 am, pt knows to arrive at 7:30   FINGER ARTHROSCOPY WITH [facility] ARTHROPLASTY Right 04/24/2020   Procedure: RIGHT INDEX FINGER METACARPAL PHALANGEAL ARTHROPLASTY;  Surgeon: Brunilda Capra, MD;  Location: [facility];  Service: Orthopedics;  Laterality: Right;   LIGAMENT REPAIR Right 1980   Knee   MALONEY DILATION N/A 09/12/2017   Procedure: Londa Rival DILATION;  Surgeon: Suzette Espy, MD;  [facility];  Service: Endoscopy;   Laterality: N/A;   MALONEY DILATION N/A 10/25/2019   Procedure: Londa Rival DILATION;  Surgeon: Suzette Espy, MD;  [facility];  Service: Endoscopy;  Laterality: N/A;   TRIGGER FINGER RELEASE Right 04/24/2020   Procedure: RIGHT LONG TRIGGER FINGER RELEASE TRIGGER FINGER/A-1 PULLEY;  Surgeon: Brunilda Capra, MD;  Location: [facility];  Service: Orthopedics;  Laterality: Right;    ROS: Review of  Systems Negative except as stated above  PHYSICAL EXAM: BP 111/68 (BP Location: Left Arm, Patient Position: Sitting, Cuff Size: Normal)   Pulse 62   Resp 20   Ht 5' 1 (1.549 m)   Wt 163 lb 6.4 oz (74.1 kg)   LMP 09/29/2016 (Approximate)   SpO2 100%   BMI 30.87 kg/m   Wt Readings from Last 3 Encounters:  11/03/23 163 lb 6.4 oz (74.1 kg)  07/05/23 161 lb (73 kg)  06/01/23 162 lb 6.4 oz (73.7 kg)    Physical Exam  General appearance - alert, well appearing, and in no distress Mental status - normal mood, behavior, speech, dress, motor activity, and thought processes Chest - clear to auscultation, no wheezes, rales or rhonchi, symmetric air entry Heart - normal rate, regular rhythm, normal S1, S2, no murmurs, rubs, clicks or gallops Extremities - peripheral pulses normal, no pedal edema, no clubbing or cyanosis Neuro: Decreased sensation on plantar surface of both feet on leap exam     Latest Ref Rng & Units 06/10/2023   11:26 AM 04/08/2023    2:23 PM 04/09/2022    9:40 AM  CMP  Glucose 70 - 99 mg/dL 99  85  91   BUN 8 - 27 mg/dL 14  14  17    Creatinine 0.57 - 1.00 mg/dL 2.13  0.86  5.78   Sodium 134 - 144 mmol/L 143  141  141   Potassium 3.5 - 5.2 mmol/L 4.1  4.5  4.1   Chloride 96 - 106 mmol/L 102  103  105   CO2 20 - 29 mmol/L 26  22  26    Calcium  8.7 - 10.3 mg/dL 9.5  9.7  9.3   Total Protein 6.0 - 8.5 g/dL 7.2  7.4  7.7   Total Bilirubin 0.0 - 1.2 mg/dL 0.4  <4.6  0.5   Alkaline Phos 44 - 121 IU/L 70  69  65   AST 0 - 40 IU/L 19  18  18    ALT 0 - 32  IU/L 18  14  18     Lipid Panel     Component Value Date/Time   CHOL 216 (H) 10/15/2020 0925   TRIG 78 10/15/2020 0925   HDL 51 10/15/2020 0925   CHOLHDL 5.1 (H) 10/26/2019 1022   CHOLHDL 2.8 09/28/2013 0904   VLDL 25 09/28/2013 0904   LDLCALC 151 (H) 10/15/2020 0925    CBC    Component Value Date/Time   WBC 8.4 06/10/2023 1126   WBC 7.2 04/09/2022 0940   RBC 4.68 06/10/2023 1126   RBC 4.58 04/09/2022 0940   HGB 13.8 06/10/2023 1126   HCT 41.7 06/10/2023 1126   PLT 172 06/10/2023 1126   MCV 89 06/10/2023 1126   MCH 29.5 06/10/2023 1126   MCH 29.7 04/09/2022 0940   MCHC 33.1 06/10/2023 1126   MCHC 33.1 04/09/2022 0940   RDW 13.0 06/10/2023 1126   LYMPHSABS 3.1 06/10/2023 1126   MONOABS 0.5 03/02/2018 0904   EOSABS 0.2 06/10/2023 1126   BASOSABS 0.1 06/10/2023 1126    ASSESSMENT AND PLAN: 1. Essential hypertension (Primary) At goal.  Continue Norvasc  10 mg daily  2. Hyperlipidemia, unspecified hyperlipidemia type Due for lipid profile check.  Does have history of cirrhosis but LFTs have been normal so we will have her continue the lovastatin  10 mg daily. - Lipid panel - Hepatic Function Panel  3. Marijuana user Patient reports that it helps with her arthritis symptoms and with her  mental health.  I have discouraged use.  4. Peripheral polyneuropathy Of questionable etiology.  However I told her that taking gabapentin  1600 mg 4 times a day is too much and exceeds the daily recommended dose.  Advised that she go back to taking it as prescribed Discussed adding Cymbalta  20 mg daily to see if it would help with her symptoms.  Informed her that the medication is used to treat depression/anxiety and is also used for peripheral neuropathy symptoms.  We will check B12 and folate level.  Advised that if the medication causes any depression or suicidal thoughts, she should stop the medicine and come on in. - Vitamin B12 - Folate - DULoxetine  (CYMBALTA ) 20 MG capsule; Take 1  capsule (20 mg total) by mouth daily.  Dispense: 30 capsule; Refill: 3    Patient was given the opportunity to ask questions.  Patient verbalized understanding of the plan and was able to repeat key elements of the plan.   This documentation was completed using Paediatric nurse.  Any transcriptional errors are unintentional.  Orders Placed This Encounter  Procedures   Lipid panel   Hepatic Function Panel   Vitamin B12   Folate     Requested Prescriptions   Signed Prescriptions Disp Refills   DULoxetine  (CYMBALTA ) 20 MG capsule 30 capsule 3    Sig: Take 1 capsule (20 mg total) by mouth daily.    Return in about 5 months (around 04/04/2024).  Concetta Dee, MD, FACP

## 2023-11-03 NOTE — Patient Instructions (Signed)
 VISIT SUMMARY:  Today, we reviewed your chronic conditions, including hypertension, cirrhosis, and hyperlipidemia, and addressed your new symptoms of burning and tingling in your feet.  YOUR PLAN:  -PERIPHERAL NEUROPATHY: Peripheral neuropathy is a condition that results in burning, tingling, and numbness in the feet, possibly due to vitamin B12 deficiency or diabetes. We discussed that your current gabapentin  dosage is higher than recommended and may affect your liver. You should limit gabapentin  to 800 mg every 6 hours. We will also start you on Cymbalta  20 mg to help manage your symptoms. Blood tests for vitamin B12 and folate levels, as well as a liver function test, have been ordered.  -HYPERTENSION: Hypertension, or high blood pressure, is controlled with your current medication, amlodipine  10 mg daily. You should continue this medication and limit your salt intake. We also discussed that your occasional shortness of breath may be linked to marijuana use.  -HYPERLIPIDEMIA: Hyperlipidemia is a condition of having high cholesterol levels. You are currently taking lovastatin  10 mg daily. We have ordered a cholesterol level test and a liver function test to monitor your condition.  -CIRRHOSIS: Cirrhosis is a condition where the liver is scarred and its function is impaired. Your last liver ultrasound showed no abnormalities. You should continue with regular liver ultrasounds every six months to monitor your condition.  INSTRUCTIONS:  Please follow up with the blood tests for vitamin B12, folate levels, and liver function. Continue taking your medications as prescribed and limit your gabapentin  to 800 mg every 6 hours. We will also start you on Cymbalta  20 mg for your neuropathy symptoms. Additionally, please schedule your next liver ultrasound in six months.

## 2023-11-04 LAB — LIPID PANEL
Chol/HDL Ratio: 5.2 ratio — ABNORMAL HIGH (ref 0.0–4.4)
Cholesterol, Total: 208 mg/dL — ABNORMAL HIGH (ref 100–199)
HDL: 40 mg/dL (ref >39)
LDL Chol Calc (NIH): 141 mg/dL — ABNORMAL HIGH (ref 0–99)
Triglycerides: 149 mg/dL (ref 0–149)
VLDL Cholesterol Cal: 27 mg/dL (ref 5–40)

## 2023-11-04 LAB — HEPATIC FUNCTION PANEL
ALT: 19 IU/L (ref 0–32)
AST: 22 IU/L (ref 0–40)
Albumin: 4.6 g/dL (ref 3.9–4.9)
Alkaline Phosphatase: 78 IU/L (ref 44–121)
Bilirubin Total: 0.3 mg/dL (ref 0.0–1.2)
Bilirubin, Direct: 0.12 mg/dL (ref 0.00–0.40)
Total Protein: 7.4 g/dL (ref 6.0–8.5)

## 2023-11-04 LAB — VITAMIN B12: Vitamin B-12: 686 pg/mL (ref 232–1245)

## 2023-11-04 LAB — FOLATE: Folate: >20 ng/mL (ref >3.0)

## 2023-11-06 ENCOUNTER — Ambulatory Visit: Payer: Self-pay | Admitting: Internal Medicine

## 2023-11-07 ENCOUNTER — Ambulatory Visit: Payer: Self-pay | Admitting: *Deleted

## 2023-11-07 NOTE — Telephone Encounter (Signed)
 Pt given lab results per notes of Dr. Lincoln Renshaw from 11/06/23 on 11/06/23. Pt verbalized understanding and reports she has been consistently taking 10 mg  lovastatin . Told patient she can take 2 tablets of 10 mg to make 20 mg until PCP sends new Rx to pharmacy. Please advise if ok for patient to start 20 mg tonight.

## 2023-11-07 NOTE — Telephone Encounter (Signed)
 Noted! Thank you

## 2023-11-07 NOTE — Telephone Encounter (Signed)
 FYI

## 2023-11-07 NOTE — Telephone Encounter (Signed)
 Copied from CRM [id number]. Topic: Clinical - Lab/Test Results >> Nov 07, 2023  3:22 PM Rosamond Comes wrote:  Reason for CRM: patient returning call about lab results  patient is asking to speak with a nurse

## 2023-11-08 ENCOUNTER — Other Ambulatory Visit: Payer: Self-pay | Admitting: Internal Medicine

## 2023-11-08 DIAGNOSIS — M47816 Spondylosis without myelopathy or radiculopathy, lumbar region: Secondary | ICD-10-CM

## 2023-11-08 DIAGNOSIS — G5603 Carpal tunnel syndrome, bilateral upper limbs: Secondary | ICD-10-CM

## 2023-11-09 ENCOUNTER — Other Ambulatory Visit: Payer: Self-pay | Admitting: Internal Medicine

## 2023-11-09 MED ORDER — LOVASTATIN 20 MG PO TABS: 20.0000 mg | ORAL_TABLET | Freq: Every day | ORAL | 3 refills | Status: AC

## 2023-12-05 ENCOUNTER — Other Ambulatory Visit: Payer: Self-pay | Admitting: Internal Medicine

## 2023-12-05 ENCOUNTER — Other Ambulatory Visit: Payer: Self-pay | Admitting: Nurse Practitioner

## 2023-12-05 DIAGNOSIS — I10 Essential (primary) hypertension: Secondary | ICD-10-CM

## 2023-12-07 ENCOUNTER — Telehealth: Payer: Self-pay | Admitting: Internal Medicine

## 2023-12-07 NOTE — Telephone Encounter (Signed)
 Phone call placed to patient today.  I had received a request for refill on the colchicine  and looking back over her chart I did not see a diagnosis of gout.  I left her message asking that she return my call with a quick clarification for why she is on the colchicine .

## 2023-12-08 NOTE — Telephone Encounter (Signed)
 Copied from CRM [id number]. Topic: General - Other >> Dec 08, 2023  8:08 AM Rosina BIRCH wrote:  Reason for CRM: patient returning a call to MD [facility]

## 2023-12-08 NOTE — Telephone Encounter (Signed)
 Pt returned call, she disconnected when asked the question from PCP.

## 2023-12-08 NOTE — Telephone Encounter (Signed)
 Stating Oh I'm cool then disconnected

## 2023-12-09 ENCOUNTER — Ambulatory Visit: Admitting: Gastroenterology

## 2023-12-09 ENCOUNTER — Encounter: Payer: Self-pay | Admitting: Gastroenterology

## 2023-12-09 ENCOUNTER — Encounter: Payer: Self-pay | Admitting: *Deleted

## 2023-12-09 VITALS — BP 121/73 | HR 67 | Temp 98.1°F | Ht 61.0 in | Wt 161.8 lb

## 2023-12-09 DIAGNOSIS — Z789 Other specified health status: Secondary | ICD-10-CM | POA: Insufficient documentation

## 2023-12-09 DIAGNOSIS — K219 Gastro-esophageal reflux disease without esophagitis: Secondary | ICD-10-CM

## 2023-12-09 DIAGNOSIS — Z8619 Personal history of other infectious and parasitic diseases: Secondary | ICD-10-CM

## 2023-12-09 DIAGNOSIS — K7460 Unspecified cirrhosis of liver: Secondary | ICD-10-CM

## 2023-12-09 DIAGNOSIS — F1011 Alcohol abuse, in remission: Secondary | ICD-10-CM | POA: Diagnosis not present

## 2023-12-09 NOTE — Progress Notes (Signed)
 GI Office Note    Referring Provider: Vicci Barnie NOVAK, MD Primary Care Physician:  Vicci Barnie NOVAK, MD  Primary Gastroenterologist: Ozell Hollingshead, MD   Chief Complaint   Chief Complaint  Patient presents with   Follow-up    Follow up. No problems     History of Present Illness   Rachel Hudson is a 63 y.o. female presenting today for follow up. Last seen in 05/2023. H/O HCV related cirrhosis, s/p HCV eradication and GERD.    Well compensated cirrhosis. MELD 3.0 of 7 previously. Doing well. No complaints. No abdominal pain. No weight loss. No edema. No n/v. Reflux well controlled. BMs regular. No melena, brbpr.   Prior history of remote etoh abuse, quit when she found out she had cirrhosis/liver issues, over a decade ago. Has drank occasional wine since then but fortunately has cut out all etoh.   No known prior Hep A/B vaccines.    History of prior Hep B core Total Ab positive with negative Hep B surf Ag/Ab remotely.  Labs MELD 3.0: 7 05/2023 US : 05/2023 AFP: 2.9 05/2023 Hep A/B vaccination: HAV Ab reactive. Hep B surf Ab/Ag neg, Hep B core Total Ab positive EGD:  03/2022, no varices BB: N/A Ascites/peripheral edema: none Diuretics: none Paracentesis: no History of SBP: no Encephalopathy:  no episodes   EGD 03/2022: -normal esophagus -small hh -future screening EGD guided by kPA >20 and/or platelet count less than 150,000   Colonoscopy February 2017: -Internal hemorrhoids -Colonic diverticulosis -Next colonoscopy 2027.  Medications   Current Outpatient Medications  Medication Sig Dispense Refill   amLODipine  (NORVASC ) 10 MG tablet Take 1 tablet by mouth once daily 90 tablet 0   Brimonidine Tartrate (LUMIFY OP) Place 1 drop into both eyes daily.     Cholecalciferol (VITAMIN D-3 PO) Take 1 tablet by mouth daily.     colchicine  0.6 MG tablet Take 1 tablet (0.6 mg total) by mouth 2 (two) times daily. 180 tablet 1   COLLAGEN PO Take 1 Scoop by mouth  in the morning, at noon, in the evening, and at bedtime.     diclofenac  (VOLTAREN ) 75 MG EC tablet Take 1 tablet (75 mg total) by mouth daily. 30 tablet 4   DULoxetine  (CYMBALTA ) 20 MG capsule Take 1 capsule (20 mg total) by mouth daily. 30 capsule 3   gabapentin  (NEURONTIN ) 800 MG tablet TAKE 1 TABLET BY MOUTH EVERY 6 HOURS 120 tablet 5   lovastatin  (MEVACOR ) 20 MG tablet Take 1 tablet (20 mg total) by mouth at bedtime. Dose increase 11/09/2023 90 tablet 3   MILK THISTLE PO Take 175 mg by mouth daily.     Multiple Vitamin (MULTIVITAMIN WITH MINERALS) TABS tablet Take 1 tablet by mouth daily. Centrum Multivitamin     Nutritional Supplements (LIVER DEFENSE PO) Take 2 tablets by mouth in the morning. Liverite Liver Aid     Omega-3 Fatty Acids (FISH OIL PO) Take 1 capsule by mouth in the morning.     pantoprazole  (PROTONIX ) 40 MG tablet Take 1-2 tablets (40-80 mg total) by mouth daily before breakfast. Take one tablet once to twice daily before a meal as needed for acid reflux. 180 tablet 1   No current facility-administered medications for this visit.    Allergies   Allergies as of 12/09/2023 - Review Complete 12/09/2023  Allergen Reaction Noted   Penicillins  03/28/2011   Tylenol  [acetaminophen ] Other (See Comments) 07/14/2014      Review of Systems  General: Negative for anorexia, weight loss, fever, chills, fatigue, weakness. ENT: Negative for hoarseness, difficulty swallowing , nasal congestion. CV: Negative for chest pain, angina, palpitations, dyspnea on exertion, peripheral edema.  Respiratory: Negative for dyspnea at rest, dyspnea on exertion, cough, sputum, wheezing.  GI: See history of present illness. GU:  Negative for dysuria, hematuria, urinary incontinence, urinary frequency, nocturnal urination.  Endo: Negative for unusual weight change.     Physical Exam   BP 121/73 (BP Location: Right Arm, Patient Position: Sitting, Cuff Size: Normal)   Pulse 67   Temp 98.1 F  (36.7 C) (Temporal)   Ht 5' 1 (1.549 m)   Wt 161 lb 12.8 oz (73.4 kg)   LMP 09/29/2016 (Approximate)   BMI 30.57 kg/m    General: Well-nourished, well-developed in no acute distress.  Eyes: No icterus. Mouth: Oropharyngeal mucosa moist and pink   Lungs: Clear to auscultation bilaterally.  Heart: Regular rate and rhythm, no murmurs rubs or gallops.  Abdomen: Bowel sounds are normal, nontender, nondistended, no hepatosplenomegaly or masses,  no abdominal bruits or hernia , no rebound or guarding.  Rectal: not performed Extremities: No lower extremity edema. No clubbing or deformities. Neuro: Alert and oriented x 4   Skin: Warm and dry, no jaundice.   Psych: Alert and cooperative, normal mood and affect.  Labs   Lab Results  Component Value Date   NA 143 06/10/2023   CL 102 06/10/2023   K 4.1 06/10/2023   CO2 26 06/10/2023   BUN 14 06/10/2023   CREATININE 0.73 06/10/2023   EGFR 94 06/10/2023   CALCIUM  9.5 06/10/2023   ALBUMIN 4.6 11/03/2023   GLUCOSE 99 06/10/2023   Lab Results  Component Value Date   ALT 19 11/03/2023   AST 22 11/03/2023   ALKPHOS 78 11/03/2023   BILITOT 0.3 11/03/2023   Lab Results  Component Value Date   WBC 8.4 06/10/2023   HGB 13.8 06/10/2023   HCT 41.7 06/10/2023   MCV 89 06/10/2023   PLT 172 06/10/2023     Lab Results  Component Value Date   VITAMINB12 686 11/03/2023   Lab Results  Component Value Date   FOLATE >20.0 11/03/2023    Imaging Studies   No results found.  Assessment/Plan:     Cirrhosis: prior HCV, successfully treated. Well compensated -due for labs and hepatoma screening -will also investigate positive Hep B core total Ab -encouraged continued daily exercise -discouraged all etoh use  -return ov in six months   GERD: doing well -continue pantoprazole  40mg  once to twice daily as needed       Sonny RAMAN. Ezzard, MHS, PA-C [facility]

## 2023-12-09 NOTE — Patient Instructions (Signed)
 So good to see you today!  Complete your labs at a [facility] at your earliest convenience. No need to fast for these labs.  We will schedule your liver ultrasound in [place].   Continue pantoprazole  once to twice daily for acid reflux.   Continue to avoid alcohol.   We will see you back in six months. Call sooner if needed.

## 2023-12-09 NOTE — Telephone Encounter (Signed)
 Called but no answer. LVM to call back.

## 2023-12-13 ENCOUNTER — Ambulatory Visit (HOSPITAL_COMMUNITY)
Admission: RE | Admit: 2023-12-13 | Discharge: 2023-12-13 | Disposition: A | Discharge status: Home / Self Care | Source: Ambulatory Visit | Attending: Gastroenterology | Admitting: Gastroenterology

## 2023-12-13 DIAGNOSIS — K7460 Unspecified cirrhosis of liver: Secondary | ICD-10-CM | POA: Diagnosis not present

## 2023-12-13 DIAGNOSIS — K219 Gastro-esophageal reflux disease without esophagitis: Secondary | ICD-10-CM | POA: Insufficient documentation

## 2023-12-13 DIAGNOSIS — Z1289 Encounter for screening for malignant neoplasm of other sites: Secondary | ICD-10-CM | POA: Diagnosis not present

## 2023-12-13 DIAGNOSIS — Z8619 Personal history of other infectious and parasitic diseases: Secondary | ICD-10-CM | POA: Insufficient documentation

## 2023-12-13 DIAGNOSIS — Z789 Other specified health status: Secondary | ICD-10-CM | POA: Insufficient documentation

## 2023-12-14 ENCOUNTER — Telehealth: Payer: Self-pay | Admitting: Internal Medicine

## 2023-12-14 LAB — COMPREHENSIVE METABOLIC PANEL WITH GFR
ALT: 21 IU/L (ref 0–32)
AST: 24 IU/L (ref 0–40)
Albumin: 4.6 g/dL (ref 3.9–4.9)
Alkaline Phosphatase: 78 IU/L (ref 44–121)
BUN/Creatinine Ratio: 15 (ref 12–28)
BUN: 11 mg/dL (ref 8–27)
Bilirubin Total: 0.3 mg/dL (ref 0.0–1.2)
CO2: 23 mmol/L (ref 20–29)
Calcium: 9.4 mg/dL (ref 8.7–10.3)
Chloride: 100 mmol/L (ref 96–106)
Creatinine, Ser: 0.73 mg/dL (ref 0.57–1.00)
Globulin, Total: 2.7 g/dL (ref 1.5–4.5)
Glucose: 92 mg/dL (ref 70–99)
Potassium: 4.3 mmol/L (ref 3.5–5.2)
Sodium: 139 mmol/L (ref 134–144)
Total Protein: 7.3 g/dL (ref 6.0–8.5)
eGFR: 93 mL/min/1.73 (ref >59)

## 2023-12-14 LAB — HEPATITIS B SURFACE ANTIGEN: Hepatitis B Surface Ag: NEGATIVE

## 2023-12-14 LAB — CBC WITH DIFFERENTIAL/PLATELET
Basophils Absolute: 0.1 x10E3/uL (ref 0.0–0.2)
Basos: 1 %
EOS (ABSOLUTE): 0.3 x10E3/uL (ref 0.0–0.4)
Eos: 3 %
Hematocrit: 44.7 % (ref 34.0–46.6)
Hemoglobin: 14.3 g/dL (ref 11.1–15.9)
Immature Grans (Abs): 0 x10E3/uL (ref 0.0–0.1)
Immature Granulocytes: 0 %
Lymphocytes Absolute: 3.2 x10E3/uL — ABNORMAL HIGH (ref 0.7–3.1)
Lymphs: 39 %
MCH: 29.3 pg (ref 26.6–33.0)
MCHC: 32 g/dL (ref 31.5–35.7)
MCV: 92 fL (ref 79–97)
Monocytes Absolute: 0.6 x10E3/uL (ref 0.1–0.9)
Monocytes: 7 %
Neutrophils Absolute: 4 x10E3/uL (ref 1.4–7.0)
Neutrophils: 50 %
Platelets: 211 x10E3/uL (ref 150–450)
RBC: 4.88 x10E6/uL (ref 3.77–5.28)
RDW: 13.1 % (ref 11.7–15.4)
WBC: 8.2 x10E3/uL (ref 3.4–10.8)

## 2023-12-14 LAB — PROTIME-INR
INR: 1 (ref 0.9–1.2)
Prothrombin Time: 10.9 s (ref 9.1–12.0)

## 2023-12-14 LAB — HEPATITIS B DNA, ULTRAQUANTITATIVE, PCR: HBV DNA SERPL PCR-ACNC: NOT DETECTED [IU]/mL

## 2023-12-14 LAB — HEPATITIS B E ANTIGEN: Hep B E Ag: NEGATIVE

## 2023-12-14 LAB — HEPATITIS B SURFACE ANTIBODY,QUALITATIVE: Hep B Surface Ab, Qual: NONREACTIVE

## 2023-12-14 LAB — HEPATITIS B E ANTIBODY: Hep B E Ab: NONREACTIVE

## 2023-12-14 LAB — AFP TUMOR MARKER: AFP, Serum, Tumor Marker: 3 ng/mL (ref 0.0–9.2)

## 2023-12-14 NOTE — Telephone Encounter (Signed)
 I was able to reach patient today via phone.  I wanted to clarify why she was on the colchicine  0.6 mg twice a day.  Patient states she was on it because of history of gout.  However she states she has been out of it for 2 weeks and wonders whether she still needs it or not since she is on the diclofenac .  I told her that we can discontinue the colchicine  since she is on diclofenac .  Patient was okay with this.

## 2023-12-16 ENCOUNTER — Ambulatory Visit: Payer: Self-pay | Admitting: Gastroenterology

## 2023-12-19 ENCOUNTER — Other Ambulatory Visit: Payer: Self-pay | Admitting: Internal Medicine

## 2024-01-06 ENCOUNTER — Other Ambulatory Visit: Payer: Self-pay | Admitting: Internal Medicine

## 2024-01-06 DIAGNOSIS — Z1231 Encounter for screening mammogram for malignant neoplasm of breast: Secondary | ICD-10-CM

## 2024-01-12 ENCOUNTER — Other Ambulatory Visit: Payer: Self-pay | Admitting: Internal Medicine

## 2024-01-12 DIAGNOSIS — G8929 Other chronic pain: Secondary | ICD-10-CM

## 2024-01-25 ENCOUNTER — Telehealth: Payer: Self-pay | Admitting: Internal Medicine

## 2024-01-25 NOTE — Telephone Encounter (Signed)
 Called & spoke to the patient. Patient clarified that Diclofenac  is not helping with this flare-up. Gout started since last conversation 12/14/2023. On-going for about a month and a half. Toes are tingling / burning the diclofenac  is helping her feet but not the toes. Hands are feeling intermittently like gout. Patient is requesting a refill for colchicine . Please advise.

## 2024-01-25 NOTE — Telephone Encounter (Signed)
 Copied from CRM [id number]. Topic: Clinical - Medication Question >> Jan 25, 2024  1:31 PM Rachel Hudson wrote:  Reason for CRM: Patient is calling because she want to know if Dr. Vicci will call in a prescription for her medication for her gout. She says the medication starts with a C.

## 2024-01-25 NOTE — Telephone Encounter (Signed)
 Please try to get her in with me or any available provider to evaluate but preferably with me.

## 2024-01-25 NOTE — Telephone Encounter (Signed)
 Please see my phone conversation with the pt 12/14/2023. Please remind her of this conversation.  Is she having a flare of gout?

## 2024-01-26 NOTE — Telephone Encounter (Signed)
 Called patient, patient has been scheduled for 03/08/2024 at 2:10 pm.

## 2024-01-30 ENCOUNTER — Telehealth: Payer: Self-pay | Admitting: Internal Medicine

## 2024-01-30 NOTE — Telephone Encounter (Signed)
 Confirmed appt fo 9/9

## 2024-01-30 NOTE — Telephone Encounter (Signed)
 Called & spoke to the patient. Verified name & DOB. Informed that a sooner appointment became available. Patient agreed to reschedule appointment for Tuesday 01/31/2024. Patient confirmed visit.

## 2024-01-31 ENCOUNTER — Ambulatory Visit: Admitting: Internal Medicine

## 2024-02-13 ENCOUNTER — Ambulatory Visit
Admission: RE | Admit: 2024-02-13 | Discharge: 2024-02-13 | Disposition: A | Discharge status: Home / Self Care | Source: Ambulatory Visit | Attending: Internal Medicine | Admitting: Internal Medicine

## 2024-02-13 DIAGNOSIS — Z1231 Encounter for screening mammogram for malignant neoplasm of breast: Secondary | ICD-10-CM | POA: Diagnosis not present

## 2024-02-15 ENCOUNTER — Ambulatory Visit: Payer: Self-pay | Admitting: Internal Medicine

## 2024-02-29 ENCOUNTER — Other Ambulatory Visit: Payer: Self-pay | Admitting: Nurse Practitioner

## 2024-02-29 DIAGNOSIS — K219 Gastro-esophageal reflux disease without esophagitis: Secondary | ICD-10-CM

## 2024-03-08 ENCOUNTER — Ambulatory Visit: Admitting: Internal Medicine

## 2024-03-12 ENCOUNTER — Ambulatory Visit: Attending: Internal Medicine | Admitting: Internal Medicine

## 2024-03-12 VITALS — BP 111/66 | HR 64 | Temp 98.2°F | Ht 61.0 in | Wt 158.0 lb

## 2024-03-12 DIAGNOSIS — M2550 Pain in unspecified joint: Secondary | ICD-10-CM | POA: Diagnosis not present

## 2024-03-12 DIAGNOSIS — G8929 Other chronic pain: Secondary | ICD-10-CM

## 2024-03-12 DIAGNOSIS — M542 Cervicalgia: Secondary | ICD-10-CM | POA: Diagnosis not present

## 2024-03-12 MED ORDER — COLCHICINE 0.6 MG PO TABS: 0.6000 mg | ORAL_TABLET | Freq: Every day | ORAL | 4 refills | Status: AC

## 2024-03-12 NOTE — Progress Notes (Signed)
 Patient ID: Rachel Hudson, female    DOB: 02/24/1961  MRN: [id number]  CC: Gout (Gout flare-up concern. /Arthritis pain on L shoulder is radiating to neck & arm - painful when siting up/Already received flu vax)   Subjective: Rachel Hudson is a 63 y.o. female who presents for UC visit. Her concerns today include:  Patient with history of HTN, HL, hepatitis C (treated) with cirrhosis followed by GI, GERD and chronic lower back pain with hx surgery for spinal fixation of L4-L5, OA hands, bipolar affective ds, CTS hands.    Discussed the use of AI scribe software for clinical note transcription with the patient, who gave verbal consent to proceed.  History of Present Illness Rachel Hudson is a 63 year old female with arthritis who presents with concerns of a gout flare and worsening joint stiffness.  She was initially placed on colchicine  specialist I think her podiatrist several years ago.  We had stopped colchicine  we put her on diclofenac  to take daily.  Since being off colchicine  feels she has been experiencing more stiffness in the joints toes and leg.she is requesting to be placed back on the colchicine .    On last visit she complained of some burning sensation in her feet despite being on high-dose gabapentin .  Low-dose Cymbalta .  She discontinued taking the Cymbalta  stating that it was causing sleepiness.  She describes a significant worsening of arthritis, which has spread to her neck and left shoulder.  She thinks she had x-ray done of her neck and shoulder several months ago showing worsening arthritis.  However the x-ray that she is referring to was done in November of last year showing mild arthritis changes left shoulder.  She was referred to orthopedics Dr. Jerri who had given a prednisone  taper that she did not find helpful. she has been using a heating pad to the back of her neck and OTC pain patches for relief and attempts to stay active despite the pain.  Currently, she is  taking diclofenac  75 mg, but finds it ineffective in managing her pain. She was previously on Celebrex  before being switched to diclofenac .      Patient Active Problem List   Diagnosis Date Noted   Hepatitis B core antibody negative 12/09/2023   Portal hypertension (HCC) 04/23/2022   Marijuana user 03/30/2021   Bipolar 1 disorder (HCC) 01/28/2021   Insulin  resistance 01/28/2021   Other hyperlipidemia 12/11/2020   Class 1 obesity with serious comorbidity and body mass index (BMI) of 32.0 to 32.9 in adult 12/03/2020   Influenza vaccine needed 02/26/2020   Bipolar affective disorder, currently manic, mild (HCC) 09/12/2017   Dysphagia 08/24/2017   Neuropathy of both feet 04/05/2017   Status post lumbar spinal fusion 10/22/2016   GERD (gastroesophageal reflux disease) 08/09/2016   Spondylolisthesis of lumbar region 07/29/2016   Sinus bradycardia 06/15/2016   Gingivitis 06/15/2016   ASCUS favor benign 11/13/2015   History of hepatitis C 10/13/2015   Bilateral carpal tunnel syndrome 09/16/2015   Cervical spondylosis without myelopathy 09/16/2015   Primary osteoarthritis of both hands 09/16/2015   Diverticulosis of colon without hemorrhage    Peptic ulcer disease    Spondylosis of lumbar region without myelopathy or radiculopathy 03/21/2015   Lumbago 11/18/2014   Carpal tunnel syndrome 11/18/2014   Hepatic cirrhosis (HCC) 08/06/2014   HTN (hypertension) 02/28/2014     Current Outpatient Medications on File Prior to Visit  Medication Sig Dispense Refill   amLODipine  (NORVASC ) 10 MG  tablet Take 1 tablet by mouth once daily 90 tablet 0   Brimonidine Tartrate (LUMIFY OP) Place 1 drop into both eyes daily.     Cholecalciferol (VITAMIN D-3 PO) Take 1 tablet by mouth daily.     COLLAGEN PO Take 1 Scoop by mouth in the morning, at noon, in the evening, and at bedtime.     diclofenac  (VOLTAREN ) 75 MG EC tablet Take 1 tablet by mouth once daily 30 tablet 4   gabapentin  (NEURONTIN ) 800 MG  tablet TAKE 1 TABLET BY MOUTH EVERY 6 HOURS 120 tablet 5   lovastatin  (MEVACOR ) 20 MG tablet Take 1 tablet (20 mg total) by mouth at bedtime. Dose increase 11/09/2023 90 tablet 3   MILK THISTLE PO Take 175 mg by mouth daily.     Multiple Vitamin (MULTIVITAMIN WITH MINERALS) TABS tablet Take 1 tablet by mouth daily. Centrum Multivitamin     Nutritional Supplements (LIVER DEFENSE PO) Take 2 tablets by mouth in the morning. Liverite Liver Aid     Omega-3 Fatty Acids (FISH OIL PO) Take 1 capsule by mouth in the morning.     pantoprazole  (PROTONIX ) 40 MG tablet TAKE 1 TABLET BY MOUTH ONCE TO TWICE DAILY BEFORE BREAKFAST/MEAL AS NEEDED FOR ACID REFLUX 180 tablet 0   No current facility-administered medications on file prior to visit.    Allergies  Allergen Reactions   Penicillins     Facial swelling Has patient had a PCN reaction causing immediate rash, facial/tongue/throat swelling, SOB or lightheadedness with hypotension: Yes Has patient had a PCN reaction causing severe rash involving mucus membranes or skin necrosis: No Has patient had a PCN reaction that required hospitalization No Has patient had a PCN reaction occurring within the last 10 years: Yes If all of the above answers are NO, then may proceed with Cephalosporin use.    Tylenol  [Acetaminophen ] Other (See Comments)    HX of Hep. C    Social History   Socioeconomic History   Marital status: Single    Spouse name: Not on file   Number of children: Not on file   Years of education: Not on file   Highest education level: Not on file  Occupational History   Occupation: disabled  Tobacco Use   Smoking status: Never   Smokeless tobacco: Never  Vaping Use   Vaping status: Never Used  Substance and Sexual Activity   Alcohol use: Not Currently    Comment: remote heavy use, quit more than a decade ago when she found out she had liver issues   Drug use: Yes    Frequency: 7.0 times per week    Types: Marijuana    Comment:  daily for arthritis   Sexual activity: Not Currently    Partners: Male    Birth control/protection: None, Condom  Other Topics Concern   Not on file  Social History Narrative   Not on file   Social Drivers of Health   Financial Resource Strain: High Risk (04/08/2023)   Overall Financial Resource Strain (CARDIA)    Difficulty of Paying Living Expenses: Very hard  Food Insecurity: Food Insecurity Present (04/08/2023)   Hunger Vital Sign    Worried About Running Out of Food in the Last Year: Sometimes true    Ran Out of Food in the Last Year: Often true  Transportation Needs: No Transportation Needs (04/08/2023)   PRAPARE - Administrator, Civil Service (Medical): No    Lack of Transportation (Non-Medical): No  Physical  Activity: Sufficiently Active (04/08/2023)   Exercise Vital Sign    Days of Exercise per Week: 7 days    Minutes of Exercise per Session: 30 min  Stress: No Stress Concern Present (04/08/2023)   [employer] of Occupational Health - Occupational Stress Questionnaire    Feeling of Stress : Not at all  Social Connections: Socially Isolated (04/08/2023)   Social Connection and Isolation Panel    Frequency of Communication with Friends and Family: Never    Frequency of Social Gatherings with Friends and Family: Never    Attends Religious Services: More than 4 times per year    Active Member of [employer] or Organizations: No    Attends Banker Meetings: Never    Marital Status: Never married  Intimate Partner Violence: Not At Risk (04/08/2023)   Humiliation, Afraid, Rape, and Kick questionnaire    Fear of Current or Ex-Partner: No    Emotionally Abused: No    Physically Abused: No    Sexually Abused: No    Family History  Adopted: Yes  Problem Relation Age of Onset   Colon cancer Neg Hx    Breast cancer Neg Hx     Past Surgical History:  Procedure Laterality Date   BACK SURGERY     lumbar fusion   BIOPSY  09/12/2017    Procedure: BIOPSY;  Surgeon: Shaaron Lamar HERO, MD;  Location: [facility];  Service: Endoscopy;;  gastric   CARPAL TUNNEL RELEASE Right 11/13/2015   Procedure: RIGHT CARPAL TUNNEL RELEASE;  Surgeon: Franky Curia, MD;  Location: [facility];  Service: Orthopedics;  Laterality: Right;   CARPAL TUNNEL RELEASE Left 01/29/2016   Procedure: left CARPAL TUNNEL RELEASE;  Surgeon: Franky Curia, MD;  Location: [facility];  Service: Orthopedics;  Laterality: Left;   COLONOSCOPY N/A 07/08/2015   Surgeon: Lamar HERO Shaaron, MD; internal hemorrhoids, colonic diverticulosis.  Repeat in 10 years.   ESOPHAGOGASTRODUODENOSCOPY N/A 07/08/2015   Surgeon: Lamar HERO Shaaron, MD; erosive reflux esophagitis, hiatal hernia, gastric erosion and ulcer s/p biopsy (reactive gastropathy, negative H. pylori), no esophageal varices or portal hypertension.   ESOPHAGOGASTRODUODENOSCOPY (EGD) WITH PROPOFOL  N/A 09/12/2017   Surgeon: Shaaron Lamar HERO, MD; normal esophagus s/p dilation, portal hypertensive gastropathy, erosive gastropathy biopsied (negative for H. pylori), normal examined duodenum.   ESOPHAGOGASTRODUODENOSCOPY (EGD) WITH PROPOFOL  N/A 10/25/2019   Surgeon: Shaaron Lamar HERO, MD;  normal esophagus s/p dilation, mild erosive reflux esophagitis, portal hypertensive gastropathy, due for repeat in 2023.   ESOPHAGOGASTRODUODENOSCOPY (EGD) WITH PROPOFOL  N/A 04/14/2022   Procedure: ESOPHAGOGASTRODUODENOSCOPY (EGD) WITH PROPOFOL ;  Surgeon: Shaaron Lamar HERO, MD;  Location: [facility];  Service: Endoscopy;  Laterality: N/A;  11:15 am, pt knows to arrive at 7:30   FINGER ARTHROSCOPY WITH [facility]) ARTHROPLASTY Right 04/24/2020   Procedure: RIGHT INDEX FINGER METACARPAL PHALANGEAL ARTHROPLASTY;  Surgeon: Curia Franky, MD;  Location: [facility];  Service: Orthopedics;  Laterality: Right;   LIGAMENT REPAIR Right 1980   Knee   MALONEY DILATION N/A 09/12/2017   Procedure: AGAPITO  DILATION;  Surgeon: Shaaron Lamar HERO, MD;  Location: [facility];  Service: Endoscopy;  Laterality: N/A;   MALONEY DILATION N/A 10/25/2019   Procedure: AGAPITO DILATION;  Surgeon: Shaaron Lamar HERO, MD;  Location: [facility];  Service: Endoscopy;  Laterality: N/A;   TRIGGER FINGER RELEASE Right 04/24/2020   Procedure: RIGHT LONG TRIGGER FINGER RELEASE TRIGGER FINGER/A-1 PULLEY;  Surgeon: Curia Franky, MD;  Location: [facility];  Service: Orthopedics;  Laterality: Right;    ROS: Review of Systems Negative except as stated above  PHYSICAL EXAM: BP 111/66 (BP Location: Right Arm, Patient Position: Sitting, Cuff Size: Small)   Pulse 64   Temp 98.2 F (36.8 C) (Oral)   Ht 5' 1 (1.549 m)   Wt 158 lb (71.7 kg)   LMP 09/29/2016 (Approximate)   SpO2 95%   BMI 29.85 kg/m   Physical Exam  General appearance - alert, well appearing, and in no distress Mental status - normal mood, behavior, speech, dress, motor activity, and thought processes Musculoskeletal - Hands: Mild to moderate enlargement of the PIP joints of both hands right greater than left.  Mild enlargement of the DIP joints. Neck: Tenderness on palpation of the cervical spine.  Good range of motion. Left shoulder: No point tenderness.      Latest Ref Rng & Units 12/13/2023    7:36 AM 11/03/2023   11:49 AM 06/10/2023   11:26 AM  CMP  Glucose 70 - 99 mg/dL 92   99   BUN 8 - 27 mg/dL 11   14   Creatinine 9.42 - 1.00 mg/dL 9.26   9.26   Sodium 865 - 144 mmol/L 139   143   Potassium 3.5 - 5.2 mmol/L 4.3   4.1   Chloride 96 - 106 mmol/L 100   102   CO2 20 - 29 mmol/L 23   26   Calcium  8.7 - 10.3 mg/dL 9.4   9.5   Total Protein 6.0 - 8.5 g/dL 7.3  7.4  7.2   Total Bilirubin 0.0 - 1.2 mg/dL 0.3  0.3  0.4   Alkaline Phos 44 - 121 IU/L 78  78  70   AST 0 - 40 IU/L 24  22  19    ALT 0 - 32 IU/L 21  19  18     Lipid Panel     Component Value Date/Time   CHOL 208 (H) 11/03/2023 1149   TRIG 149 11/03/2023 1149    HDL 40 11/03/2023 1149   CHOLHDL 5.2 (H) 11/03/2023 1149   CHOLHDL 2.8 09/28/2013 0904   VLDL 25 09/28/2013 0904   LDLCALC 141 (H) 11/03/2023 1149    CBC    Component Value Date/Time   WBC 8.2 12/13/2023 0736   WBC 7.2 04/09/2022 0940   RBC 4.88 12/13/2023 0736   RBC 4.58 04/09/2022 0940   HGB 14.3 12/13/2023 0736   HCT 44.7 12/13/2023 0736   PLT 211 12/13/2023 0736   MCV 92 12/13/2023 0736   MCH 29.3 12/13/2023 0736   MCH 29.7 04/09/2022 0940   MCHC 32.0 12/13/2023 0736   MCHC 33.1 04/09/2022 0940   RDW 13.1 12/13/2023 0736   LYMPHSABS 3.2 (H) 12/13/2023 0736   MONOABS 0.5 03/02/2018 0904   EOSABS 0.3 12/13/2023 0736   BASOSABS 0.1 12/13/2023 0736    ASSESSMENT AND PLAN: 1. Polyarthralgia (Primary) Patient requesting to be put back on colchicine  which she does have some anti-inflammatory use.  I agreed to restart colchicine  once a day.  We discussed putting her back on Celebrex  but patient declined that.  I offered referral for her to see orthopedics again regarding her left shoulder but she declined stating that she would not want a steroid injection. - colchicine  0.6 MG tablet; Take 1 tablet (0.6 mg total) by mouth daily.  Dispense: 30 tablet; Refill: 4  2. Neck pain, chronic Will start with getting an x-ray of the neck. -  DG Cervical Spine Complete; Future  Patient was given the opportunity to ask questions.  Patient verbalized understanding of the plan and was able to repeat key elements of the plan.   This documentation was completed using Paediatric nurse.  Any transcriptional errors are unintentional.  Orders Placed This Encounter  Procedures   DG Cervical Spine Complete     Requested Prescriptions   Signed Prescriptions Disp Refills   colchicine  0.6 MG tablet 30 tablet 4    Sig: Take 1 tablet (0.6 mg total) by mouth daily.    No follow-ups on file.  Barnie Louder, MD, FACP

## 2024-03-12 NOTE — Patient Instructions (Addendum)
  VISIT SUMMARY: Today, we discussed your concerns about a gout flare and worsening joint stiffness. You mentioned that your arthritis has spread to your neck and left shoulder, and that you have been experiencing severe pain in your feet. We reviewed your current medications and their effectiveness.  YOUR PLAN: -OSTEOARTHRITIS INVOLVING MULTIPLE JOINTS: Osteoarthritis is a condition where the protective cartilage that cushions the ends of your bones wears down over time. We will discontinue duloxetine  due to its side effects and continue with diclofenac  for its anti-inflammatory benefits. If your shoulder symptoms persist, we may refer you to an orthopedic specialist for further management. We will restart colchicine  at a dose of once daily to help manage your joint symptoms  . You should also continue taking diclofenac  for its anti-inflammatory benefits.  Please go to [facility] to have x-rays done of the neck  INSTRUCTIONS: Please restart taking colchicine  once daily and continue with diclofenac  as prescribed. If your shoulder pain persists, we may need to refer you to an orthopedic specialist. Make sure to stay active and use a heating pad for relief as needed. Follow up with us  if you experience any new symptoms or if your current symptoms worsen.                      Contains text generated by Abridge.                                 Contains text generated by Abridge.

## 2024-03-13 ENCOUNTER — Other Ambulatory Visit: Payer: Self-pay | Admitting: Nurse Practitioner

## 2024-03-13 DIAGNOSIS — I10 Essential (primary) hypertension: Secondary | ICD-10-CM

## 2024-03-14 ENCOUNTER — Ambulatory Visit
Admission: RE | Admit: 2024-03-14 | Discharge: 2024-03-14 | Disposition: A | Discharge status: Home / Self Care | Source: Ambulatory Visit | Attending: Internal Medicine | Admitting: Internal Medicine

## 2024-03-14 DIAGNOSIS — G8929 Other chronic pain: Secondary | ICD-10-CM

## 2024-03-14 DIAGNOSIS — M47812 Spondylosis without myelopathy or radiculopathy, cervical region: Secondary | ICD-10-CM | POA: Diagnosis not present

## 2024-03-17 ENCOUNTER — Ambulatory Visit: Payer: Self-pay | Admitting: Internal Medicine

## 2024-03-21 ENCOUNTER — Other Ambulatory Visit: Payer: Self-pay | Admitting: Internal Medicine

## 2024-03-21 DIAGNOSIS — G8929 Other chronic pain: Secondary | ICD-10-CM

## 2024-03-26 ENCOUNTER — Encounter: Payer: Self-pay | Admitting: Radiology

## 2024-03-29 ENCOUNTER — Encounter (HOSPITAL_COMMUNITY): Payer: Self-pay

## 2024-03-29 ENCOUNTER — Ambulatory Visit (INDEPENDENT_AMBULATORY_CARE_PROVIDER_SITE_OTHER)

## 2024-03-29 ENCOUNTER — Ambulatory Visit (HOSPITAL_COMMUNITY): Payer: Self-pay | Admitting: Physician Assistant

## 2024-03-29 ENCOUNTER — Ambulatory Visit (HOSPITAL_COMMUNITY)
Admission: EM | Admit: 2024-03-29 | Discharge: 2024-03-29 | Disposition: A | Discharge status: Home / Self Care | Attending: Physician Assistant | Admitting: Physician Assistant

## 2024-03-29 DIAGNOSIS — R2231 Localized swelling, mass and lump, right upper limb: Secondary | ICD-10-CM

## 2024-03-29 DIAGNOSIS — S60221A Contusion of right hand, initial encounter: Secondary | ICD-10-CM | POA: Diagnosis not present

## 2024-03-29 DIAGNOSIS — Z96691 Finger-joint replacement of right hand: Secondary | ICD-10-CM | POA: Diagnosis not present

## 2024-03-29 DIAGNOSIS — M7989 Other specified soft tissue disorders: Secondary | ICD-10-CM | POA: Diagnosis not present

## 2024-03-29 NOTE — Progress Notes (Signed)
 Radiology review does not show signs of acute fracture or dislocation. She should still follow up with Hand specialist in the next 1-3 days for evaluation and management.

## 2024-03-29 NOTE — ED Provider Notes (Signed)
 [facility]    CSN: [id number] Arrival date & time: 03/29/24  1145      History   Chief Complaint Chief Complaint  Patient presents with   Hand Injury    HPI Rachel Hudson is a 63 y.o. female.  has a past medical history of ADD (attention deficit disorder), ADHD, Anxiety, Arthritis, Arthritis, Back pain, Bipolar 1 disorder (HCC), Cirrhosis of liver (HCC) (01/2014), Foot pain, bilateral, GERD (gastroesophageal reflux disease), Gout, Hand pain, Hep C w/o coma, chronic (HCC) (As of 10/22/13), Hyperlipidemia, Hypertension, Joint pain, Liver problem, Osteoarthritis, Other fatigue, Shortness of breath, Shortness of breath on exertion, and Vitamin D deficiency.   HPI  Discussed the use of AI scribe software for clinical note transcription with the patient, who gave verbal consent to proceed.  The patient presents with right hand pain and swelling after a possible injury while stacking wood.  Earlier today, while stacking wood, they noticed pain and swelling in their right hand. They suspect they may have hit their hand during the activity, although they are not certain. The swelling appeared quickly, and they are unable to bend their fingers like they normally would   The pain is localized to the top of the hand and fingers. They have taken two doses of  Aleve  muscle  this morning to manage the pain, which allowed them to continue stacking wood.  No drainage, pus, or bleeding from the hand.   Pt is right hand dominant and has a previous hx of arthritis in the right second digit.    Past Medical History:  Diagnosis Date   ADD (attention deficit disorder)    ADHD    Anxiety    Arthritis    Arthritis    Back pain    Bipolar 1 disorder (HCC)    Cirrhosis of liver (HCC) 01/2014   Stage 4   Foot pain, bilateral    GERD (gastroesophageal reflux disease)    Gout    Hand pain    Hep C w/o coma, chronic (HCC) As of 10/22/13   S/p treatment with Harvoni , achieved SVR    Hyperlipidemia    Hypertension    Joint pain    Liver problem    Osteoarthritis    Other fatigue    Shortness of breath    Shortness of breath on exertion    Vitamin D deficiency     Patient Active Problem List   Diagnosis Date Noted   Hepatitis B core antibody negative 12/09/2023   Portal hypertension (HCC) 04/23/2022   Marijuana user 03/30/2021   Bipolar 1 disorder (HCC) 01/28/2021   Insulin  resistance 01/28/2021   Other hyperlipidemia 12/11/2020   Class 1 obesity with serious comorbidity and body mass index (BMI) of 32.0 to 32.9 in adult 12/03/2020   Influenza vaccine needed 02/26/2020   Bipolar affective disorder, currently manic, mild (HCC) 09/12/2017   Dysphagia 08/24/2017   Neuropathy of both feet 04/05/2017   Status post lumbar spinal fusion 10/22/2016   GERD (gastroesophageal reflux disease) 08/09/2016   Spondylolisthesis of lumbar region 07/29/2016   Sinus bradycardia 06/15/2016   Gingivitis 06/15/2016   ASCUS favor benign 11/13/2015   History of hepatitis C 10/13/2015   Bilateral carpal tunnel syndrome 09/16/2015   Cervical spondylosis without myelopathy 09/16/2015   Primary osteoarthritis of both hands 09/16/2015   Diverticulosis of colon without hemorrhage    Peptic ulcer disease    Spondylosis of lumbar region without myelopathy or radiculopathy 03/21/2015   Lumbago 11/18/2014  Carpal tunnel syndrome 11/18/2014   Hepatic cirrhosis (HCC) 08/06/2014   HTN (hypertension) 02/28/2014    Past Surgical History:  Procedure Laterality Date   BACK SURGERY     lumbar fusion   BIOPSY  09/12/2017   Procedure: BIOPSY;  Surgeon: Shaaron Lamar HERO, MD;  Location: [facility];  Service: Endoscopy;;  gastric   CARPAL TUNNEL RELEASE Right 11/13/2015   Procedure: RIGHT CARPAL TUNNEL RELEASE;  Surgeon: Franky Curia, MD;  Location: [facility];  Service: Orthopedics;  Laterality: Right;   CARPAL TUNNEL RELEASE Left 01/29/2016   Procedure: left CARPAL TUNNEL  RELEASE;  Surgeon: Franky Curia, MD;  Location: [facility];  Service: Orthopedics;  Laterality: Left;   COLONOSCOPY N/A 07/08/2015   Surgeon: Lamar HERO Shaaron, MD; internal hemorrhoids, colonic diverticulosis.  Repeat in 10 years.   ESOPHAGOGASTRODUODENOSCOPY N/A 07/08/2015   Surgeon: Lamar HERO Shaaron, MD; erosive reflux esophagitis, hiatal hernia, gastric erosion and ulcer s/p biopsy (reactive gastropathy, negative H. pylori), no esophageal varices or portal hypertension.   ESOPHAGOGASTRODUODENOSCOPY (EGD) WITH PROPOFOL  N/A 09/12/2017   Surgeon: Shaaron Lamar HERO, MD; normal esophagus s/p dilation, portal hypertensive gastropathy, erosive gastropathy biopsied (negative for H. pylori), normal examined duodenum.   ESOPHAGOGASTRODUODENOSCOPY (EGD) WITH PROPOFOL  N/A 10/25/2019   Surgeon: Shaaron Lamar HERO, MD;  normal esophagus s/p dilation, mild erosive reflux esophagitis, portal hypertensive gastropathy, due for repeat in 2023.   ESOPHAGOGASTRODUODENOSCOPY (EGD) WITH PROPOFOL  N/A 04/14/2022   Procedure: ESOPHAGOGASTRODUODENOSCOPY (EGD) WITH PROPOFOL ;  Surgeon: Shaaron Lamar HERO, MD;  Location: [facility];  Service: Endoscopy;  Laterality: N/A;  11:15 am, pt knows to arrive at 7:30   FINGER [facility]ROSCOPY WITH [facility]) ARTHROPLASTY Right 04/24/2020   Procedure: RIGHT INDEX FINGER METACARPAL PHALANGEAL ARTHROPLASTY;  Surgeon: Curia Franky, MD;  Location: [facility];  Service: Orthopedics;  Laterality: Right;   LIGAMENT REPAIR Right 1980   Knee   MALONEY DILATION N/A 09/12/2017   Procedure: AGAPITO DILATION;  Surgeon: Shaaron Lamar HERO, MD;  [facility]: [facility];  Service: Endoscopy;  Laterality: N/A;   MALONEY DILATION N/A 10/25/2019   Procedure: AGAPITO DILATION;  Surgeon: Shaaron Lamar HERO, MD;  Location: [facility];  Service: Endoscopy;  Laterality: N/A;   TRIGGER FINGER RELEASE Right 04/24/2020   Procedure: RIGHT LONG TRIGGER FINGER RELEASE TRIGGER FINGER/A-1  PULLEY;  Surgeon: Curia Franky, MD;  Location: [facility];  Service: Orthopedics;  Laterality: Right;    OB History     Gravida  0   Para  0   Term  0   Preterm  0   AB  0   Living  0      SAB  0   IAB  0   Ectopic  0   Multiple  0   Live Births  0            Home Medications    Prior to Admission medications   Medication Sig Start Date End Date Taking? Authorizing Provider  amLODipine  (NORVASC ) 10 MG tablet Take 1 tablet by mouth once daily 03/14/24   Vicci Barnie NOVAK, MD  Brimonidine Tartrate (LUMIFY OP) Place 1 drop into both eyes daily.    [provider]  Cholecalciferol (VITAMIN D-3 PO) Take 1 tablet by mouth daily.    [provider]  colchicine  0.6 MG tablet Take 1 tablet (0.6 mg total) by mouth daily. 03/12/24   Vicci Barnie NOVAK, MD  COLLAGEN PO Take 1 Scoop by  mouth in the morning, at noon, in the evening, and at bedtime.    [provider]  diclofenac  (VOLTAREN ) 75 MG EC tablet Take 1 tablet by mouth once daily 01/12/24   Vicci Barnie NOVAK, MD  gabapentin  (NEURONTIN ) 800 MG tablet TAKE 1 TABLET BY MOUTH EVERY 6 HOURS 11/08/23   Vicci Barnie NOVAK, MD  lovastatin  (MEVACOR ) 20 MG tablet Take 1 tablet (20 mg total) by mouth at bedtime. Dose increase 11/09/2023 11/09/23   Vicci Barnie NOVAK, MD  MILK THISTLE PO Take 175 mg by mouth daily.    [provider]  Multiple Vitamin (MULTIVITAMIN WITH MINERALS) TABS tablet Take 1 tablet by mouth daily. Centrum Multivitamin    [provider]  Nutritional Supplements (LIVER DEFENSE PO) Take 2 tablets by mouth in the morning. Liverite Liver Aid    [provider]  Omega-3 Fatty Acids (FISH OIL PO) Take 1 capsule by mouth in the morning.    [provider]  pantoprazole  (PROTONIX ) 40 MG tablet TAKE 1 TABLET BY MOUTH ONCE TO TWICE DAILY BEFORE BREAKFAST/MEAL AS NEEDED FOR ACID REFLUX 02/29/24   Vicci Barnie NOVAK, MD    Family  History Family History  Adopted: Yes  Problem Relation Age of Onset   Colon cancer Neg Hx    Breast cancer Neg Hx     Social History Social History   Tobacco Use   Smoking status: Never   Smokeless tobacco: Never  Vaping Use   Vaping status: Never Used  Substance Use Topics   Alcohol use: Not Currently    Comment: remote heavy use, quit more than a decade ago when she found out she had liver issues   Drug use: Yes    Frequency: 7.0 times per week    Types: Marijuana    Comment: daily for arthritis     Allergies   Penicillins and Tylenol  [acetaminophen ]   Review of Systems Review of Systems  Skin:        Right hand swelling      Physical Exam Triage Vital Signs ED Triage Vitals  Encounter Vitals Group     BP 03/29/24 1253 101/65     Girls Systolic BP Percentile --      Girls Diastolic BP Percentile --      Boys Systolic BP Percentile --      Boys Diastolic BP Percentile --      Pulse Rate 03/29/24 1253 (!) 58     Resp 03/29/24 1253 18     Temp 03/29/24 1253 98.1 F (36.7 C)     Temp Source 03/29/24 1253 Oral     SpO2 03/29/24 1253 92 %     Weight --      Height --      Head Circumference --      Peak Flow --      Pain Score 03/29/24 1252 9     Pain Loc --      Pain Education --      Exclude from Growth Chart --    No data found.  Updated Vital Signs BP 101/65 (BP Location: Left Arm)   Pulse (!) 58   Temp 98.1 F (36.7 C) (Oral)   Resp 18   LMP 09/29/2016 (Approximate)   SpO2 92%   Visual Acuity Right Eye Distance:   Left Eye Distance:   Bilateral Distance:    Right Eye Near:   Left Eye Near:    Bilateral Near:     Physical  Exam Vitals reviewed.  Constitutional:      General: She is awake.     Appearance: Normal appearance. She is well-developed and well-groomed.  HENT:     Head: Normocephalic and atraumatic.  Eyes:     General: Lids are normal. Gaze aligned appropriately.     Extraocular Movements: Extraocular movements intact.      Conjunctiva/sclera: Conjunctivae normal.  Pulmonary:     Effort: Pulmonary effort is normal.  Musculoskeletal:       Hands:     Comments: She reports decreased ability to flex her right fingers and make a fist. She is able to touch her thumb to all of her fingers on the the right hand. She has cap refill <2 seconds at the distal aspect of the second and third fingers.    Neurological:     Mental Status: She is alert and oriented to person, place, and time.  Psychiatric:        Attention and Perception: Attention and perception normal.        Mood and Affect: Mood and affect normal.        Speech: Speech normal.        Behavior: Behavior normal. Behavior is cooperative.        Media Information   Document Information  Photos  Right hand swelling  03/29/2024 13:41  Attached To:  Hospital Encounter on 03/29/24  Source Information  Allia Wiltsey, Rocky FORBES RIGGERS  [facility]      Media Information   Document Information  Photos  Swelling of right hand  03/29/2024 13:41  Attached To:  Hospital Encounter on 03/29/24  Source Information  Fleming Prill, Rocky FORBES, PA-C  [facility]    UC Treatments / Results  Labs (all labs ordered are listed, but only abnormal results are displayed) Labs Reviewed - No data to display  EKG   Radiology No results found.  Procedures Procedures (including critical care time)  Medications Ordered in UC Medications - No data to display  Initial Impression / Assessment and Plan / UC Course  I have reviewed the triage vital signs and the nursing notes.  Pertinent labs & imaging results that were available during my care of the patient were reviewed by me and considered in my medical decision making (see chart for details).      Final Clinical Impressions(s) / UC Diagnoses   Final diagnoses:  Localized swelling on right hand  Contusion of right hand, initial encounter   Patient presents today with concerns for  right hand pain along with swelling particularly along the dorsal aspect at the 2nd and 3rd MCP joints.  She states that this occurred while she was stacking wood earlier this morning and is unsure if she hit her smashed her finger or if something bit her.  Physical exam is notable for significant swelling along the dorsal aspect of the 2nd and 3rd MCP joint with slightly reduced ROM.  Capillary refill is intact at the distal aspect of index and middle fingers.  Imaging does not appear to show an acute fracture or dislocation but still waiting on radiology interpretation.  Given degree of swelling as well as the fact that this is the patient's dominant hand I recommend that she follows up with a hand specialist for further evaluation.  Patient request that she receive a phone call if management plan needs to be updated.  She states that she recently had carpal tunnel surgery and she will reach out to the  practice that completed this procedure for further evaluation.  Contact information for [facility] and [facility] were also provided in AVS.  Recommend close follow-up for further management.    Discharge Instructions      You were seen today for concerns of right hand and finger swelling that started today after you were stacking wood.  At this time I do not see any obvious signs of fracture or dislocation but I am concerned that you may have a soft tissue injury or a collection of fluid causing your swelling.  I recommend that you follow-up with a hand specialist for further evaluation and to make sure that the injury is treated appropriately. If you notice increased swelling, difficulty moving your fingers, numbness or tingling, extreme stiffness or your hand starts to feel like wood or stone please go to the emergency room immediately  [facility] [address], [place], [place] [address] [phone]  [facility] [address] [place], [place] [address]  [phone]       ED Prescriptions   None    PDMP not reviewed this encounter.   Marylene Rocky BRAVO, PA-C 03/29/24 1418

## 2024-03-29 NOTE — ED Triage Notes (Signed)
 Pt states she was stacking wood and noticed swelling to top on rt hand an hour ago. Unknown injury.

## 2024-03-29 NOTE — Discharge Instructions (Addendum)
 You were seen today for concerns of right hand and finger swelling that started today after you were stacking wood.  At this time I do not see any obvious signs of fracture or dislocation but I am concerned that you may have a soft tissue injury or a collection of fluid causing your swelling.  I recommend that you follow-up with a hand specialist for further evaluation and to make sure that the injury is treated appropriately. If you notice increased swelling, difficulty moving your fingers, numbness or tingling, extreme stiffness or your hand starts to feel like wood or stone please go to the emergency room immediately  [facility] [address], [place], [place] [address] [phone]  [facility] [address], [place], [place] [address]  [phone]

## 2024-04-02 ENCOUNTER — Ambulatory Visit: Payer: Self-pay

## 2024-04-02 DIAGNOSIS — M19041 Primary osteoarthritis, right hand: Secondary | ICD-10-CM

## 2024-04-02 DIAGNOSIS — M109 Gout, unspecified: Secondary | ICD-10-CM

## 2024-04-02 NOTE — Telephone Encounter (Signed)
 3 attempts to contact pt, LVMTCB each time. Routing to clinic.

## 2024-04-02 NOTE — Telephone Encounter (Signed)
 Second attempt to contact patient for triage.  LVM for patient to return call to [phone]. Placed in callbacks for additional attempt  Copied from CRM #[id number]. Topic: Clinical - Medication Question >> Apr 02, 2024 10:59 AM Tobias CROME wrote: Reason for CRM: Patient states the medicine is not working for the gout in her foot but is working for her hand. Patient requesting for provider to find the right gout medicine.    Best call back number: [phone]

## 2024-04-02 NOTE — Telephone Encounter (Signed)
 Copied from CRM #[id number]. Topic: Clinical - Medication Question >> Apr 02, 2024 10:59 AM Tobias CROME wrote: Reason for CRM: Patient states the medicine is not working for the gout in her foot but is working for her hand. Patient requesting for provider to find the right gout medicine.   Best call back number: [phone]

## 2024-04-02 NOTE — Telephone Encounter (Signed)
 I do not have any other medication to offer. Already on Colchicine  and Diclofenac . If gout, it would have responded to these meds. I can refer her to rheumatology.

## 2024-04-03 NOTE — Telephone Encounter (Signed)
 Left message on voicemail to return call.  Informed on voicemail to return call to direct on next steps.

## 2024-04-04 NOTE — Telephone Encounter (Signed)
 Patient would like Rheumatology referral.  Would like appt. scheduled  in [place].

## 2024-04-04 NOTE — Addendum Note (Signed)
 Addended by: VICCI SOBER B on: 04/04/2024 01:42 PM   Modules accepted: Orders

## 2024-04-05 ENCOUNTER — Ambulatory Visit: Admitting: Internal Medicine

## 2024-04-24 ENCOUNTER — Encounter: Payer: Self-pay | Admitting: Gastroenterology

## 2024-05-21 NOTE — Progress Notes (Unsigned)
 "   Consult Note  Patient: Rachel Hudson             Date of Birth: Oct 11, 1960           MRN: [id number]             Visit Date: 05/30/2024  Referring Provider: Vicci Barnie NOVAK, MD  Thank you for entrusting me in the care of this patient! Below are my findings:  Subjective:   Chief Complaint: No chief complaint on file.   History of Present Illness: Rachel Hudson is a 63 y.o. female *** who presents to the clinic today for evaluation of ***. She has a PMH of ***. Today, She is feeling ***.  Activities of Daily Living:  Patient reports morning stiffness for *** {minute/hour:19697}.   Patient {ACTIONS;DENIES/REPORTS:21021675::Denies} nocturnal pain.  Difficulty dressing/grooming: {ACTIONS;DENIES/REPORTS:21021675::Denies} Difficulty climbing stairs: {ACTIONS;DENIES/REPORTS:21021675::Denies} Difficulty getting out of chair: {ACTIONS;DENIES/REPORTS:21021675::Denies} Difficulty using hands for taps, buttons, cutlery, and/or writing: {ACTIONS;DENIES/REPORTS:21021675::Denies}  Labs From Referral: ***  Review of Systems: No Rheumatology ROS completed.    Objective: Vital Signs: LMP 09/29/2016   Physical Exam Vitals reviewed.  Constitutional:      General: She is not in acute distress.    Appearance: Normal appearance. She is well-developed and normal weight.  HENT:     Head: Normocephalic and atraumatic. No right periorbital erythema or left periorbital erythema.     Mouth/Throat:     Mouth: Mucous membranes are moist.     Pharynx: Oropharynx is clear.  Eyes:     General: Lids are normal.        Right eye: No discharge.        Left eye: No discharge.     Extraocular Movements: Extraocular movements intact.     Conjunctiva/sclera: Conjunctivae normal.     Pupils: Pupils are equal, round, and reactive to light.  Cardiovascular:     Rate and Rhythm: Normal rate and regular rhythm.     Heart sounds: Normal heart sounds. No murmur heard.    No friction rub.  No gallop.  Pulmonary:     Effort: Pulmonary effort is normal. No respiratory distress.     Breath sounds: Normal breath sounds. No stridor. No wheezing, rhonchi or rales.  Chest:     Chest wall: No deformity.  Musculoskeletal:     Right lower leg: No edema.     Left lower leg: No edema.     Comments: There is *** tenderness in ***. There is *** synovitis. There is *** swelling, warmth, crepitus, or erythema in ***. No malalignment, asymmetry, dislocation, subluxation, or laxity. Normal gait. Normal tone. Muscle strength 5/5. No atrophy.  Lymphadenopathy:     Cervical: No cervical adenopathy.  Skin:    General: Skin is warm and moist.     Capillary Refill: Capillary refill takes less than 2 seconds.     Findings: No rash.  Neurological:     Mental Status: She is alert.     Gait: Gait normal.  Psychiatric:        Mood and Affect: Mood normal.        Behavior: Behavior normal.      Imaging: No results found.  Problem List:  Patient Active Problem List   Diagnosis Date Noted   Hepatitis B core antibody negative 12/09/2023   Portal hypertension (HCC) 04/23/2022   Marijuana user 03/30/2021   Bipolar 1 disorder (HCC) 01/28/2021   Insulin  resistance 01/28/2021   Other hyperlipidemia 12/11/2020   Class  1 obesity with serious comorbidity and body mass index (BMI) of 32.0 to 32.9 in adult 12/03/2020   Influenza vaccine needed 02/26/2020   Bipolar affective disorder, currently manic, mild (HCC) 09/12/2017   Dysphagia 08/24/2017   Neuropathy of both feet 04/05/2017   Status post lumbar spinal fusion 10/22/2016   GERD (gastroesophageal reflux disease) 08/09/2016   Spondylolisthesis of lumbar region 07/29/2016   Sinus bradycardia 06/15/2016   Gingivitis 06/15/2016   ASCUS favor benign 11/13/2015   History of hepatitis C 10/13/2015   Bilateral carpal tunnel syndrome 09/16/2015   Cervical spondylosis without myelopathy 09/16/2015   Primary osteoarthritis of both hands 09/16/2015    Diverticulosis of colon without hemorrhage    Peptic ulcer disease    Spondylosis of lumbar region without myelopathy or radiculopathy 03/21/2015   Lumbago 11/18/2014   Carpal tunnel syndrome 11/18/2014   Hepatic cirrhosis (HCC) 08/06/2014   HTN (hypertension) 02/28/2014    Medication List: Current Medications[1]   PMFS History:  History: Past Medical History:  Diagnosis Date   ADD (attention deficit disorder)    ADHD    Anxiety    Arthritis    Arthritis    Back pain    Bipolar 1 disorder (HCC)    Cirrhosis of liver (HCC) 01/2014   Stage 4   Foot pain, bilateral    GERD (gastroesophageal reflux disease)    Gout    Hand pain    Hep C w/o coma, chronic (HCC) As of 10/22/13   S/p treatment with Harvoni , achieved SVR   Hyperlipidemia    Hypertension    Joint pain    Liver problem    Osteoarthritis    Other fatigue    Shortness of breath    Shortness of breath on exertion    Vitamin D deficiency     Family History  Adopted: Yes  Problem Relation Age of Onset   Colon cancer Neg Hx    Breast cancer Neg Hx    Past Surgical History:  Procedure Laterality Date   BACK SURGERY     lumbar fusion   BIOPSY  09/12/2017   Procedure: BIOPSY;  Surgeon: Shaaron Lamar HERO, MD;  [facility];  Service: Endoscopy;;  gastric   CARPAL TUNNEL RELEASE Right 11/13/2015   Procedure: RIGHT CARPAL TUNNEL RELEASE;  Surgeon: Franky Curia, MD;  Location: [facility];  Service: Orthopedics;  Laterality: Right;   CARPAL TUNNEL RELEASE Left 01/29/2016   Procedure: left CARPAL TUNNEL RELEASE;  Surgeon: Franky Curia, MD;  Location: [facility];  Service: Orthopedics;  Laterality: Left;   COLONOSCOPY N/A 07/08/2015   Surgeon: Lamar HERO Shaaron, MD; internal hemorrhoids, colonic diverticulosis.  Repeat in 10 years.   ESOPHAGOGASTRODUODENOSCOPY N/A 07/08/2015   Surgeon: Lamar HERO Shaaron, MD; erosive reflux esophagitis, hiatal hernia, gastric erosion and ulcer s/p  biopsy (reactive gastropathy, negative H. pylori), no esophageal varices or portal hypertension.   ESOPHAGOGASTRODUODENOSCOPY (EGD) WITH PROPOFOL  N/A 09/12/2017   Surgeon: Shaaron Lamar HERO, MD; normal esophagus s/p dilation, portal hypertensive gastropathy, erosive gastropathy biopsied (negative for H. pylori), normal examined duodenum.   ESOPHAGOGASTRODUODENOSCOPY (EGD) WITH PROPOFOL  N/A 10/25/2019   Surgeon: Shaaron Lamar HERO, MD;  normal esophagus s/p dilation, mild erosive reflux esophagitis, portal hypertensive gastropathy, due for repeat in 2023.   ESOPHAGOGASTRODUODENOSCOPY (EGD) WITH PROPOFOL  N/A 04/14/2022   Procedure: ESOPHAGOGASTRODUODENOSCOPY (EGD) WITH PROPOFOL ;  Surgeon: Shaaron Lamar HERO, MD;  [facility];  Service: Endoscopy;  Laterality: N/A;  11:15 am, pt  knows to arrive at 7:30   FINGER ARTHROSCOPY WITH CARPOMETACARPEL [facility]) ARTHROPLASTY Right 04/24/2020   Procedure: RIGHT INDEX FINGER METACARPAL PHALANGEAL ARTHROPLASTY;  Surgeon: Murrell Drivers, MD;  Location: [facility];  Service: Orthopedics;  Laterality: Right;   LIGAMENT REPAIR Right 1980   Knee   MALONEY DILATION N/A 09/12/2017   Procedure: AGAPITO DILATION;  Surgeon: Shaaron Lamar HERO, MD;  [facility];  Service: Endoscopy;  Laterality: N/A;   MALONEY DILATION N/A 10/25/2019   Procedure: AGAPITO DILATION;  Surgeon: Shaaron Lamar HERO, MD;  [facility];  Service: Endoscopy;  Laterality: N/A;   TRIGGER FINGER RELEASE Right 04/24/2020   Procedure: RIGHT LONG TRIGGER FINGER RELEASE TRIGGER FINGER/A-1 PULLEY;  Surgeon: Murrell Drivers, MD;  Location: [facility];  Service: Orthopedics;  Laterality: Right;   Social History   Social History Narrative   Not on file   Allergies: Penicillins and Tylenol  [acetaminophen ] Immunization status:  Immunization History  Administered Date(s) Administered   Influenza,inj,Quad PF,6+ Mos 02/04/2014, 04/28/2015, 02/19/2016, 01/21/2017,  12/27/2018, 12/27/2018, 02/26/2020, 01/14/2021   Influenza-Unspecified 03/25/2023, 01/23/2024   PFIZER(Purple Top)SARS-COV-2 Vaccination 08/18/2019, 09/08/2019, 05/03/2020   PNEUMOCOCCAL CONJUGATE-20 12/27/2020   Pfizer Covid-19 Vaccine Bivalent Booster 21yrs & up 02/15/2021   Tdap 07/14/2014, 04/10/2018   Zoster Recombinant(Shingrix) 07/26/2018, 12/27/2018     Assessment:  Visit Diagnoses: No diagnosis found.  No diagnosis found.  Follow-Up Instructions:  No follow-ups on file.   Orders: No orders of the defined types were placed in this encounter.  No orders of the defined types were placed in this encounter.    Procedures: No procedures performed  Daved GORMAN Holstein, PA-C  Note - This record has been created using [employer]. Chart creation errors have been sought, but may not always have been located. Such creation errors do not reflect on the standard of medical care.     [1]  Current Outpatient Medications:    amLODipine  (NORVASC ) 10 MG tablet, Take 1 tablet by mouth once daily, Disp: 90 tablet, Rfl: 0   Brimonidine Tartrate (LUMIFY OP), Place 1 drop into both eyes daily., Disp: , Rfl:    Cholecalciferol (VITAMIN D-3 PO), Take 1 tablet by mouth daily., Disp: , Rfl:    colchicine  0.6 MG tablet, Take 1 tablet (0.6 mg total) by mouth daily., Disp: 30 tablet, Rfl: 4   COLLAGEN PO, Take 1 Scoop by mouth in the morning, at noon, in the evening, and at bedtime., Disp: , Rfl:    diclofenac  (VOLTAREN ) 75 MG EC tablet, Take 1 tablet by mouth once daily, Disp: 30 tablet, Rfl: 4   gabapentin  (NEURONTIN ) 800 MG tablet, TAKE 1 TABLET BY MOUTH EVERY 6 HOURS, Disp: 120 tablet, Rfl: 2   lovastatin  (MEVACOR ) 20 MG tablet, Take 1 tablet (20 mg total) by mouth at bedtime. Dose increase 11/09/2023, Disp: 90 tablet, Rfl: 3   MILK THISTLE PO, Take 175 mg by mouth daily., Disp: , Rfl:    Multiple Vitamin (MULTIVITAMIN WITH MINERALS) TABS tablet, Take 1 tablet by mouth daily. Centrum  Multivitamin, Disp: , Rfl:    Nutritional Supplements (LIVER DEFENSE PO), Take 2 tablets by mouth in the morning. Liverite Liver Aid, Disp: , Rfl:    Omega-3 Fatty Acids (FISH OIL PO), Take 1 capsule by mouth in the morning., Disp: , Rfl:    pantoprazole  (PROTONIX ) 40 MG tablet, TAKE 1 TABLET BY MOUTH ONCE TO TWICE DAILY BEFORE BREAKFAST/MEAL AS NEEDED FOR ACID REFLUX, Disp: 180 tablet, Rfl: 0  "

## 2024-05-28 ENCOUNTER — Other Ambulatory Visit: Payer: Self-pay | Admitting: Internal Medicine

## 2024-05-28 DIAGNOSIS — M47816 Spondylosis without myelopathy or radiculopathy, lumbar region: Secondary | ICD-10-CM

## 2024-05-28 DIAGNOSIS — G5603 Carpal tunnel syndrome, bilateral upper limbs: Secondary | ICD-10-CM

## 2024-05-29 NOTE — Telephone Encounter (Signed)
 Requested Prescriptions  Pending Prescriptions Disp Refills   gabapentin  (NEURONTIN ) 800 MG tablet [Pharmacy Med Name: Gabapentin  800 MG Oral Tablet] 120 tablet 2    Sig: TAKE 1 TABLET BY MOUTH EVERY 6 HOURS     Neurology: Anticonvulsants - gabapentin  Passed - 05/29/2024 12:39 PM      Passed - Cr in normal range and within 360 days    Creat  Date Value Ref Range Status  06/15/2016 0.76 0.50 - 1.05 mg/dL Final    Comment:      For patients > or = 63 years of age: The upper reference limit for Creatinine is approximately 13% higher for people identified as African-American.      Creatinine, Ser  Date Value Ref Range Status  12/13/2023 0.73 0.57 - 1.00 mg/dL Final         Passed - Completed PHQ-2 or PHQ-9 in the last 360 days      Passed - Valid encounter within last 12 months    Recent Outpatient Visits           2 months ago Polyarthralgia   [facility] [place] Vicci Barnie NOVAK, MD   6 months ago Essential hypertension   [facility] Of [place]. [facility] Vicci Barnie NOVAK, MD   10 months ago Pap smear for cervical cancer screening   [facility] - [facility] Dept Of [place]. [facility] Vicci Barnie NOVAK, MD   1 year ago Chronic left shoulder pain   [facility] Of [place]. [facility] Vicci Barnie NOVAK, MD   1 year ago Primary hypertension   [facility] Dept Of [place]. [facility] Theotis Haze [place], [place]

## 2024-05-30 ENCOUNTER — Ambulatory Visit

## 2024-05-30 VITALS — BP 122/70 | HR 65 | Temp 98.4°F | Ht 60.0 in | Wt 154.0 lb

## 2024-05-30 DIAGNOSIS — M25541 Pain in joints of right hand: Secondary | ICD-10-CM | POA: Insufficient documentation

## 2024-05-30 DIAGNOSIS — M25542 Pain in joints of left hand: Secondary | ICD-10-CM | POA: Diagnosis present

## 2024-05-30 DIAGNOSIS — M25511 Pain in right shoulder: Secondary | ICD-10-CM | POA: Insufficient documentation

## 2024-05-30 DIAGNOSIS — M25512 Pain in left shoulder: Secondary | ICD-10-CM | POA: Insufficient documentation

## 2024-05-30 DIAGNOSIS — M1380 Other specified arthritis, unspecified site: Secondary | ICD-10-CM | POA: Diagnosis present

## 2024-05-31 LAB — CBC WITH DIFFERENTIAL/PLATELET
Absolute Lymphocytes: 2573 {cells}/uL (ref 850–3900)
Absolute Monocytes: 543 {cells}/uL (ref 200–950)
Basophils Absolute: 40 {cells}/uL (ref 0–200)
Basophils Relative: 0.6 %
Eosinophils Absolute: 235 {cells}/uL (ref 15–500)
Eosinophils Relative: 3.5 %
HCT: 41.2 % (ref 35.9–46.0)
Hemoglobin: 13.4 g/dL (ref 11.7–15.5)
MCH: 29.3 pg (ref 27.0–33.0)
MCHC: 32.5 g/dL (ref 31.6–35.4)
MCV: 90 fL (ref 81.4–101.7)
MPV: 11.3 fL (ref 7.5–12.5)
Monocytes Relative: 8.1 %
Neutro Abs: 3310 {cells}/uL (ref 1500–7800)
Neutrophils Relative %: 49.4 %
Platelets: 191 Thousand/uL (ref 140–400)
RBC: 4.58 Million/uL (ref 3.80–5.10)
RDW: 13 % (ref 11.0–15.0)
Total Lymphocyte: 38.4 %
WBC: 6.7 Thousand/uL (ref 3.8–10.8)

## 2024-05-31 LAB — COMPLETE METABOLIC PANEL WITHOUT GFR
AG Ratio: 1.8 (calc) (ref 1.0–2.5)
ALT: 18 U/L (ref 6–29)
AST: 18 U/L (ref 10–35)
Albumin: 4.6 g/dL (ref 3.6–5.1)
Alkaline phosphatase (APISO): 66 U/L (ref 37–153)
BUN/Creatinine Ratio: 29 (calc) — ABNORMAL HIGH (ref 6–22)
BUN: 14 mg/dL (ref 7–25)
CO2: 28 mmol/L (ref 20–32)
Calcium: 9.2 mg/dL (ref 8.6–10.4)
Chloride: 106 mmol/L (ref 98–110)
Creat: 0.49 mg/dL — ABNORMAL LOW (ref 0.50–1.05)
Globulin: 2.6 g/dL (ref 1.9–3.7)
Glucose, Bld: 82 mg/dL (ref 65–99)
Potassium: 4.3 mmol/L (ref 3.5–5.3)
Sodium: 140 mmol/L (ref 135–146)
Total Bilirubin: 0.4 mg/dL (ref 0.2–1.2)
Total Protein: 7.2 g/dL (ref 6.1–8.1)

## 2024-05-31 LAB — C-REACTIVE PROTEIN: CRP: <3 mg/L

## 2024-05-31 LAB — SEDIMENTATION RATE: Sed Rate: 6 mm/h (ref 0–30)

## 2024-05-31 LAB — ANTI-DNA ANTIBODY, DOUBLE-STRANDED: ds DNA Ab: 1 [IU]/mL

## 2024-05-31 LAB — RHEUMATOID FACTOR: Rheumatoid fact SerPl-aCnc: <10 [IU]/mL

## 2024-05-31 LAB — URIC ACID: Uric Acid, Serum: 3.9 mg/dL (ref 2.5–7.0)

## 2024-05-31 LAB — CYCLIC CITRUL PEPTIDE ANTIBODY, IGG: Cyclic Citrullin Peptide Ab: <16 U

## 2024-05-31 LAB — ANA: Anti Nuclear Antibody (ANA): NEGATIVE

## 2024-06-01 ENCOUNTER — Ambulatory Visit: Payer: Self-pay

## 2024-06-04 ENCOUNTER — Other Ambulatory Visit: Payer: Self-pay

## 2024-06-04 MED ORDER — HYDROXYCHLOROQUINE SULFATE 200 MG PO TABS: 300.0000 mg | ORAL_TABLET | Freq: Every day | ORAL | 0 refills | Status: AC

## 2024-06-08 ENCOUNTER — Ambulatory Visit: Payer: Self-pay

## 2024-06-08 NOTE — Telephone Encounter (Signed)
 FYI

## 2024-06-08 NOTE — Telephone Encounter (Signed)
 FYI Only or Action Required?: FYI only for provider: appointment scheduled on 06/20/24.  Patient was last seen in primary care on 03/12/2024 by Vicci Barnie NOVAK, MD.  Called Nurse Triage reporting Dizziness.  Symptoms began several days ago.  Interventions attempted: Prescription medications: prednisone .  Symptoms are: unchanged.  Triage Disposition: See Physician Within 24 Hours  Patient/caregiver understands and will follow disposition?: Yes     Message from Broaddus T sent at 06/08/2024 10:42 AM EST  Summary: Increased dizziness, pain   Reason for Triage: Pt calling reports she is having increased dizziness, states she feels like not enough oxygen is getting to her brain.  Also reports she is having hand pain, right hand fingers are popping out of place. Pt states she feels like she returned to work too soon.  Requesting an appt for evaluation.           Reason for Disposition  [1] MODERATE dizziness (e.g., interferes with normal activities) AND [2] has NOT been evaluated by doctor (or NP/PA) for this  (Exception: Dizziness caused by heat exposure, sudden standing, or poor fluid intake.)  Answer Assessment - Initial Assessment Questions Pt called in reporting R hand pain, dizziness and feeling that she is not getting enough oxygen. Pt denies gasping for air, no SOB, no distress. Pt states when she was dx with high cholesterol she was told she was not getting enough oxygen to her brain. Pt states she feels that dizziness is r/t cholesterol levels and wants to make sure she is getting enough O2. Pt denies any blueing or graying to lips or fingernail beds, no O2 therapy. Pt not gasping or struggling to breath during NT conversation. Denies any confusion or changes in mental status; able to hold conversation, clearly articulating words. Discussed cancellation and appt availability today with PCP but pt declined stating she would need appt M-W d/t work. Appointment scheduled for  evaluation and placed on wait list. Patient agrees with plan of care, and will call back if anything changes, or if symptoms worsen.       1. DESCRIPTION: Describe your dizziness.     Comes and goes; pt states she has to hold on to something   2. LIGHTHEADED: Do you feel lightheaded? (e.g., somewhat faint, woozy, weak upon standing)     Woozy   3. VERTIGO: Do you feel like either you or the room is spinning or tilting? (i.e., vertigo)     Spinning   4. SEVERITY: How bad is it?  Do you feel like you are going to faint? Can you stand and walk?     Able to walk, does not feel faint. States she holds on to something until it passes   5. ONSET:  When did the dizziness begin?     Started when pt returned to work. Pt states she did f/u with Rheumatology, 01/07 per chart.   6. AGGRAVATING FACTORS: Does anything make it worse? (e.g., standing, change in head position)     No   7. HEART RATE: Can you tell me your heart rate? How many beats in 15 seconds?  (Note: Not all patients can do this.)     Denies any heart racing or changes in HR  8. CAUSE: What do you think is causing the dizziness? (e.g., decreased fluids or food, diarrhea, emotional distress, heat exposure, new medicine, sudden standing, vomiting; unknown)     Unsure  9. RECURRENT SYMPTOM: Have you had dizziness before? If Yes, ask: When was the last  time? What happened that time?     No   10. OTHER SYMPTOMS: Do you have any other symptoms? (e.g., fever, chest pain, vomiting, diarrhea, bleeding)       Pt states she is having hand pain despite taking prednisone  per Rheumatology. Pt states she feels her cholesterol is too high and wants levels checked by PCP.  Protocols used: Dizziness - Lightheadedness-A-AH

## 2024-06-15 ENCOUNTER — Other Ambulatory Visit: Payer: Self-pay | Admitting: Internal Medicine

## 2024-06-15 DIAGNOSIS — I10 Essential (primary) hypertension: Secondary | ICD-10-CM

## 2024-06-19 ENCOUNTER — Telehealth: Payer: Self-pay | Admitting: Internal Medicine

## 2024-06-19 NOTE — Telephone Encounter (Signed)
 Pt confirmed appt

## 2024-06-20 ENCOUNTER — Encounter: Payer: Self-pay | Admitting: *Deleted

## 2024-06-20 ENCOUNTER — Ambulatory Visit: Payer: Self-pay | Attending: *Deleted | Admitting: *Deleted

## 2024-06-20 VITALS — BP 144/71 | HR 62 | Ht 60.0 in | Wt 155.0 lb

## 2024-06-20 DIAGNOSIS — F319 Bipolar disorder, unspecified: Secondary | ICD-10-CM

## 2024-06-20 DIAGNOSIS — E785 Hyperlipidemia, unspecified: Secondary | ICD-10-CM

## 2024-06-20 MED ORDER — QUETIAPINE FUMARATE 25 MG PO TABS: 25.0000 mg | ORAL_TABLET | Freq: Every day | ORAL | 0 refills | Status: AC

## 2024-06-20 NOTE — Assessment & Plan Note (Addendum)
 As above she self treats with a large amounts of marijuana in order to sleep. She has been on medication for mood stability in the past and did not like the side of somnolence.  She denies frequent or intense mood episodes (rapid cycling), increased duration of depressive phases, severe insomnia or reduced ability to function in daily life. She is interested in trying a low-dose medication to help her reestablish her sleep cycle. She is not interested in therapy at this time. Okay for trial of low-dose Seroquel . She will notify the clinic if she has any questions or concerns and will keep her follow-up with her primary care provider Orders:   QUEtiapine  (SEROQUEL ) 25 MG tablet; Take 1 tablet (25 mg total) by mouth at bedtime.

## 2024-06-20 NOTE — Patient Instructions (Addendum)
 Today we discussed your history of dizziness that you are told by previous provider was related to your cholesterol level. Requested a lipid panel that will be ordered today We also discussed your hand pain with history of carpal tunnel syndrome as well as osteoarthritis.  You report that you are on disability because of your hands and have started working again. Report having some issues with work. You have a history of bipolar 1 disorder that causes poor sleep quality. You are agreeable to trying a low-dose Seroquel  but are not interested in therapy at this time. Call with any questions or concerns Keep your 1 month follow-up with your provider

## 2024-06-20 NOTE — Progress Notes (Signed)
 "   Patient ID: EBONE ALCIVAR, female    DOB: 26-Jul-1960  MRN: [id number]  CC: Dizziness (Dizziness X1 mo/Finger pain, coming out of socket X3 mo/Already received flu vax)   Subjective: Rachel Hudson is a 64 y.o. female who presents for discussion of dizziness with history of elevated cholesterol.  Her evaluation included a cardiology referral for stress test.  Her memory of the results were blood was not circulating correctly and oxygen level in the brain was low.  She was started on a statin which she continues to take as ordered.  She would like a lipid panel drawn today.  Last lipid panel was drawn 7 months ago with a cholesterol of 208, triglyceride of 149, HDL of 40, LDL of 141.  She struggles with adhering to a heart healthy diet.  She stays active daily and estimates that she walks about 7500 steps a day  She denies symptoms suggesting BPPV, migraine, stroke, tumor, TIA, MS, arrhythmias, hypotension, PE, heart failure, dissection, low blood sugar, anemia, thyroid issues, anxiety or hyperventilation  Hand pain with history of carpal tunnel syndrome and OA of both hands (on disability from previous provider) now returned to work.  She reports hand cramping and fingers locking while at work. She is trying to decide whether to continue to work. She is not interested in a physical therapy evaluation or orthopedic referral   Bipolar 1 disorder She reports self treating with large amounts of marijuana in order to sleep.  She denies suicidal or homicidal ideation, no audiovisual hallucinations. Has done well on medication in the past except for the side effect profile of somnolence Would like to start a low-dose medication if it would help her sleep. She is not interested in therapy  Pertinent past medical history include: Hypertension, portal hypertension, hepatic cirrhosis, history of hep C, GERD, neuropathy in both feet, spondylosis of the lumbar and cervical region, osteoarthritis  both hands, lumbar fusion, elevated lipids, bipolar 1 disorder    Patient Active Problem List   Diagnosis Date Noted   Hepatitis B core antibody negative 12/09/2023   Portal hypertension (HCC) 04/23/2022   Marijuana user 03/30/2021   Bipolar 1 disorder (HCC) 01/28/2021   Insulin  resistance 01/28/2021   Other hyperlipidemia 12/11/2020   Neuropathy of both feet 04/05/2017   Status post lumbar spinal fusion 10/22/2016   GERD (gastroesophageal reflux disease) 08/09/2016   History of hepatitis C 10/13/2015   Cervical spondylosis without myelopathy 09/16/2015   Primary osteoarthritis of both hands 09/16/2015   Spondylosis of lumbar region without myelopathy or radiculopathy 03/21/2015   Hepatic cirrhosis (HCC) 08/06/2014   HTN (hypertension) 02/28/2014     Medications Ordered Prior to Encounter[1]  Allergies[2]  Social History   Socioeconomic History   Marital status: Single    Spouse name: Not on file   Number of children: Not on file   Years of education: Not on file   Highest education level: Not on file  Occupational History   Occupation: disabled  Tobacco Use   Smoking status: Never    Passive exposure: Past   Smokeless tobacco: Never  Vaping Use   Vaping status: Never Used  Substance and Sexual Activity   Alcohol use: Not Currently    Comment: occ   Drug use: Yes    Frequency: 7.0 times per week    Types: Marijuana    Comment: daily for arthritis   Sexual activity: Not Currently    Partners: Male    Birth control/protection: None,  Condom  Other Topics Concern   Not on file  Social History Narrative   Not on file   Social Drivers of Health   Tobacco Use: Low Risk (05/30/2024)   Patient History    Smoking Tobacco Use: Never    Smokeless Tobacco Use: Never    Passive Exposure: Past  Financial Resource Strain: High Risk (04/08/2023)   Overall Financial Resource Strain (CARDIA)    Difficulty of Paying Living Expenses: Very hard  Food Insecurity: Food  Insecurity Present (04/08/2023)   Hunger Vital Sign    Worried About Running Out of Food in the Last Year: Sometimes true    Ran Out of Food in the Last Year: Often true  Transportation Needs: No Transportation Needs (04/08/2023)   PRAPARE - Administrator, Civil Service (Medical): No    Lack of Transportation (Non-Medical): No  Physical Activity: Sufficiently Active (04/08/2023)   Exercise Vital Sign    Days of Exercise per Week: 7 days    Minutes of Exercise per Session: 30 min  Stress: No Stress Concern Present (04/08/2023)   [employer] of Occupational Health - Occupational Stress Questionnaire    Feeling of Stress : Not at all  Social Connections: Socially Isolated (04/08/2023)   Social Connection and Isolation Panel    Frequency of Communication with Friends and Family: Never    Frequency of Social Gatherings with Friends and Family: Never    Attends Religious Services: More than 4 times per year    Active Member of Clubs or Organizations: No    Attends Banker Meetings: Never    Marital Status: Never married  Intimate Partner Violence: Not At Risk (04/08/2023)   Humiliation, Afraid, Rape, and Kick questionnaire    Fear of Current or Ex-Partner: No    Emotionally Abused: No    Physically Abused: No    Sexually Abused: No  Depression (PHQ2-9): Medium Risk (03/12/2024)   Depression (PHQ2-9)    PHQ-2 Score: 5  Alcohol Screen: Low Risk (04/08/2023)   Alcohol Screen    Last Alcohol Screening Score (AUDIT): 0  Housing: Low Risk (04/08/2023)   Housing    Last Housing Risk Score: 0  Utilities: Not At Risk (04/08/2023)   AHC Utilities    Threatened with loss of utilities: No  Health Literacy: Inadequate Health Literacy (04/08/2023)   B1300 Health Literacy    Frequency of need for help with medical instructions: Sometimes    Family History  Adopted: Yes  Problem Relation Age of Onset   Colon cancer Neg Hx    Breast cancer Neg Hx     Past  Surgical History:  Procedure Laterality Date   BACK SURGERY     lumbar fusion   BIOPSY  09/12/2017   Procedure: BIOPSY;  Surgeon: Shaaron Lamar HERO, MD;  [facility]: [facility];  Service: Endoscopy;;  gastric   CARPAL TUNNEL RELEASE Right 11/13/2015   Procedure: RIGHT CARPAL TUNNEL RELEASE;  Surgeon: Franky Curia, MD;  Location: [facility];  Service: Orthopedics;  Laterality: Right;   CARPAL TUNNEL RELEASE Left 01/29/2016   Procedure: left CARPAL TUNNEL RELEASE;  Surgeon: Franky Curia, MD;  Location: [facility];  Service: Orthopedics;  Laterality: Left;   COLONOSCOPY N/A 07/08/2015   Surgeon: Lamar HERO Shaaron, MD; internal hemorrhoids, colonic diverticulosis.  Repeat in 10 years.   ESOPHAGOGASTRODUODENOSCOPY N/A 07/08/2015   Surgeon: Lamar HERO Shaaron, MD; erosive reflux esophagitis, hiatal hernia, gastric erosion and ulcer s/p biopsy (reactive  gastropathy, negative H. pylori), no esophageal varices or portal hypertension.   ESOPHAGOGASTRODUODENOSCOPY (EGD) WITH PROPOFOL  N/A 09/12/2017   Surgeon: Shaaron Lamar HERO, MD; normal esophagus s/p dilation, portal hypertensive gastropathy, erosive gastropathy biopsied (negative for H. pylori), normal examined duodenum.   ESOPHAGOGASTRODUODENOSCOPY (EGD) WITH PROPOFOL  N/A 10/25/2019   Surgeon: Shaaron Lamar HERO, MD;  normal esophagus s/p dilation, mild erosive reflux esophagitis, portal hypertensive gastropathy, due for repeat in 2023.   ESOPHAGOGASTRODUODENOSCOPY (EGD) WITH PROPOFOL  N/A 04/14/2022   Procedure: ESOPHAGOGASTRODUODENOSCOPY (EGD) WITH PROPOFOL ;  Surgeon: Shaaron Lamar HERO, MD;  Location: [facility];  Service: Endoscopy;  Laterality: N/A;  11:15 am, pt knows to arrive at 7:30   FINGER ARTHROSCOPY WITH CARPOMETACARPEL [facility]) ARTHROPLASTY Right 04/24/2020   Procedure: RIGHT INDEX FINGER METACARPAL PHALANGEAL ARTHROPLASTY;  Surgeon: Murrell Drivers, MD;  Location: [facility];  Service: Orthopedics;  Laterality:  Right;   LIGAMENT REPAIR Right 1980   Knee   MALONEY DILATION N/A 09/12/2017   Procedure: AGAPITO DILATION;  Surgeon: Shaaron Lamar HERO, MD;  [facility];  Service: Endoscopy;  Laterality: N/A;   MALONEY DILATION N/A 10/25/2019   Procedure: AGAPITO DILATION;  Surgeon: Shaaron Lamar HERO, MD;  [facility]: [facility];  Service: Endoscopy;  Laterality: N/A;   TRIGGER FINGER RELEASE Right 04/24/2020   Procedure: RIGHT LONG TRIGGER FINGER RELEASE TRIGGER FINGER/A-1 PULLEY;  Surgeon: Murrell Drivers, MD;  Location: [facility];  Service: Orthopedics;  Laterality: Right;    ROS: Review of Systems Negative except as stated above  PHYSICAL EXAM: BP (!) 144/71 (BP Location: Left Arm, Patient Position: Sitting, Cuff Size: Normal)   Pulse 62   Ht 5' (1.524 m)   Wt 70.3 kg   LMP 09/29/2016   SpO2 97%   BMI 30.27 kg/m   Physical Exam Vitals and nursing note reviewed.  Constitutional:      Appearance: Normal appearance.  Cardiovascular:     Rate and Rhythm: Normal rate and regular rhythm.  Pulmonary:     Effort: Pulmonary effort is normal.     Breath sounds: Normal breath sounds.  Abdominal:     General: Abdomen is flat.     Palpations: Abdomen is soft.  Musculoskeletal:     Comments: Good grip strength in bilateral upper extremities  Skin:    General: Skin is warm and dry.  Neurological:     Mental Status: Mental status is at baseline.     Gait: Gait normal.     Comments: Normal cranial nerves II through XII with good reflexes, sensation and motor function.  Positive Tinel's and Phalen's          Latest Ref Rng & Units 05/30/2024    8:43 AM 12/13/2023    7:36 AM 11/03/2023   11:49 AM  CMP  Glucose 65 - 99 mg/dL 82  92    BUN 7 - 25 mg/dL 14  11    Creatinine 9.49 - 1.05 mg/dL 9.50  9.26    Sodium 864 - 146 mmol/L 140  139    Potassium 3.5 - 5.3 mmol/L 4.3  4.3    Chloride 98 - 110 mmol/L 106  100    CO2 20 - 32 mmol/L 28  23    Calcium  8.6 - 10.4 mg/dL  9.2  9.4    Total Protein 6.1 - 8.1 g/dL 7.2  7.3  7.4   Total Bilirubin 0.2 - 1.2 mg/dL 0.4  0.3  0.3   Alkaline Phos 44 -  121 IU/L  78  78   AST 10 - 35 U/L 18  24  22    ALT 6 - 29 U/L 18  21  19     Lipid Panel     Component Value Date/Time   CHOL 208 (H) 11/03/2023 1149   TRIG 149 11/03/2023 1149   HDL 40 11/03/2023 1149   CHOLHDL 5.2 (H) 11/03/2023 1149   CHOLHDL 2.8 09/28/2013 0904   VLDL 25 09/28/2013 0904   LDLCALC 141 (H) 11/03/2023 1149    CBC    Component Value Date/Time   WBC 6.7 05/30/2024 0843   RBC 4.58 05/30/2024 0843   HGB 13.4 05/30/2024 0843   HGB 14.3 12/13/2023 0736   HCT 41.2 05/30/2024 0843   HCT 44.7 12/13/2023 0736   PLT 191 05/30/2024 0843   PLT 211 12/13/2023 0736   MCV 90.0 05/30/2024 0843   MCV 92 12/13/2023 0736   MCH 29.3 05/30/2024 0843   MCHC 32.5 05/30/2024 0843   RDW 13.0 05/30/2024 0843   RDW 13.1 12/13/2023 0736   LYMPHSABS 3.2 (H) 12/13/2023 0736   MONOABS 0.5 03/02/2018 0904   EOSABS 235 05/30/2024 0843   EOSABS 0.3 12/13/2023 0736   BASOSABS 40 05/30/2024 0843   BASOSABS 0.1 12/13/2023 0736    Results for orders placed or performed in visit on 05/30/24  Rheumatoid factor   Collection Time: 05/30/24  8:43 AM  Result Value Ref Range   Rheumatoid fact SerPl-aCnc <10 <14 IU/mL  Sedimentation rate   Collection Time: 05/30/24  8:43 AM  Result Value Ref Range   Sed Rate 6 0 - 30 mm/h  COMPLETE METABOLIC PANEL WITHOUT GFR   Collection Time: 05/30/24  8:43 AM  Result Value Ref Range   Glucose, Bld 82 65 - 99 mg/dL   BUN 14 7 - 25 mg/dL   Creat 9.50 (L) 9.49 - 1.05 mg/dL   BUN/Creatinine Ratio 29 (H) 6 - 22 (calc)   Sodium 140 135 - 146 mmol/L   Potassium 4.3 3.5 - 5.3 mmol/L   Chloride 106 98 - 110 mmol/L   CO2 28 20 - 32 mmol/L   Calcium  9.2 8.6 - 10.4 mg/dL   Total Protein 7.2 6.1 - 8.1 g/dL   Albumin 4.6 3.6 - 5.1 g/dL   Globulin 2.6 1.9 - 3.7 g/dL (calc)   AG Ratio 1.8 1.0 - 2.5 (calc)   Total Bilirubin 0.4 0.2 -  1.2 mg/dL   Alkaline phosphatase (APISO) 66 37 - 153 U/L   AST 18 10 - 35 U/L   ALT 18 6 - 29 U/L  CBC with Differential/Platelet   Collection Time: 05/30/24  8:43 AM  Result Value Ref Range   WBC 6.7 3.8 - 10.8 Thousand/uL   RBC 4.58 3.80 - 5.10 Million/uL   Hemoglobin 13.4 11.7 - 15.5 g/dL   HCT 58.7 64.0 - 53.9 %   MCV 90.0 81.4 - 101.7 fL   MCH 29.3 27.0 - 33.0 pg   MCHC 32.5 31.6 - 35.4 g/dL   RDW 86.9 88.9 - 84.9 %   Platelets 191 140 - 400 Thousand/uL   MPV 11.3 7.5 - 12.5 fL   Neutro Abs 3,310 1,500 - 7,800 cells/uL   Absolute Lymphocytes 2,573 850 - 3,900 cells/uL   Absolute Monocytes 543 200 - 950 cells/uL   Eosinophils Absolute 235 15 - 500 cells/uL   Basophils Absolute 40 0 - 200 cells/uL   Neutrophils Relative % 49.4 %   Total Lymphocyte 38.4 %  Monocytes Relative 8.1 %   Eosinophils Relative 3.5 %   Basophils Relative 0.6 %  ANA   Collection Time: 05/30/24  8:43 AM  Result Value Ref Range   Anti Nuclear Antibody (ANA) NEGATIVE NEGATIVE  Anti-DNA antibody, double-stranded   Collection Time: 05/30/24  8:43 AM  Result Value Ref Range   ds DNA Ab 1 IU/mL  C-reactive protein   Collection Time: 05/30/24  8:43 AM  Result Value Ref Range   CRP <3.0 <8.0 mg/L  Cyclic citrul peptide antibody, IgG   Collection Time: 05/30/24  8:43 AM  Result Value Ref Range   Cyclic Citrullin Peptide Ab <83 UNITS  Uric acid   Collection Time: 05/30/24  8:43 AM  Result Value Ref Range   Uric Acid, Serum 3.9 2.5 - 7.0 mg/dL     ASSESSMENT AND PLAN:  Assessment & Plan Elevated lipids As above she was seen by [facility] medical previously and sent to a cardiologist who started her on a statin. Her presenting symptom at that time was dizziness.  She has occasional dizziness intermittently without any red flags including severe headache, chest pain, shortness of breath, slurred speech, facial drooping, sudden weakness or numbness especially on one side, vision changes, confusion,  difficulty walking or swallowing. She was concerned that she needed her lipid panel rechecked as she is taking her statin as previously prescribed She does struggle with heart healthy diet but remains active daily Okay for lipid panel. She is encouraged to adhere to a heart healthy diet and exercise daily Lab results will be discussed with her when available. She will call if her symptoms persist or worsen and keep her appointment with her primary care provider  Orders:   Lipid Panel  Bipolar 1 disorder (HCC) As above she self treats with a large amounts of marijuana in order to sleep. She has been on medication for mood stability in the past and did not like the side of somnolence.  She denies frequent or intense mood episodes (rapid cycling), increased duration of depressive phases, severe insomnia or reduced ability to function in daily life. She is interested in trying a low-dose medication to help her reestablish her sleep cycle. She is not interested in therapy at this time. Okay for trial of low-dose Seroquel . She will notify the clinic if she has any questions or concerns and will keep her follow-up with her primary care provider Orders:   QUEtiapine  (SEROQUEL ) 25 MG tablet; Take 1 tablet (25 mg total) by mouth at bedtime.       Patient was given the opportunity to ask questions.  Patient verbalized understanding of the plan and was able to repeat key elements of the plan.   This documentation was completed using Paediatric nurse.  Any transcriptional errors are unintentional.     Requested Prescriptions    No prescriptions requested or ordered in this encounter    No follow-ups on file.  Dewon Mendizabal H, NP      [1]  Current Outpatient Medications on File Prior to Visit  Medication Sig Dispense Refill   amLODipine  (NORVASC ) 10 MG tablet Take 1 tablet by mouth once daily 90 tablet 0   Cholecalciferol (VITAMIN D-3 PO) Take 1 tablet by mouth  daily.     colchicine  0.6 MG tablet Take 1 tablet (0.6 mg total) by mouth daily. 30 tablet 4   COLLAGEN PO Take 1 Scoop by mouth in the morning, at noon, in the evening, and at bedtime.  diclofenac  (VOLTAREN ) 75 MG EC tablet Take 1 tablet by mouth once daily 30 tablet 4   gabapentin  (NEURONTIN ) 800 MG tablet TAKE 1 TABLET BY MOUTH EVERY 6 HOURS 120 tablet 2   hydroxychloroquine  (PLAQUENIL ) 200 MG tablet Take 1.5 tablets (300 mg total) by mouth daily. 45 tablet 0   lovastatin  (MEVACOR ) 20 MG tablet Take 1 tablet (20 mg total) by mouth at bedtime. Dose increase 11/09/2023 90 tablet 3   MILK THISTLE PO Take 175 mg by mouth daily.     Multiple Vitamin (MULTIVITAMIN WITH MINERALS) TABS tablet Take 1 tablet by mouth daily. Centrum Multivitamin     Nutritional Supplements (LIVER DEFENSE PO) Take 2 tablets by mouth in the morning. Liverite Liver Aid     Omega-3 Fatty Acids (FISH OIL PO) Take 1 capsule by mouth in the morning.     pantoprazole  (PROTONIX ) 40 MG tablet TAKE 1 TABLET BY MOUTH ONCE TO TWICE DAILY BEFORE BREAKFAST/MEAL AS NEEDED FOR ACID REFLUX 180 tablet 0   No current facility-administered medications on file prior to visit.  [2]  Allergies Allergen Reactions   Penicillins     Facial swelling Has patient had a PCN reaction causing immediate rash, facial/tongue/throat swelling, SOB or lightheadedness with hypotension: Yes Has patient had a PCN reaction causing severe rash involving mucus membranes or skin necrosis: No Has patient had a PCN reaction that required hospitalization No Has patient had a PCN reaction occurring within the last 10 years: Yes If all of the above answers are NO, then may proceed with Cephalosporin use.    Tylenol  [Acetaminophen ] Other (See Comments)    HX of Hep. C   "

## 2024-06-21 ENCOUNTER — Ambulatory Visit: Payer: Self-pay | Admitting: *Deleted

## 2024-06-21 LAB — LIPID PANEL
Chol/HDL Ratio: 4.3 ratio (ref 0.0–4.4)
Cholesterol, Total: 191 mg/dL (ref 100–199)
HDL: 44 mg/dL
LDL Chol Calc (NIH): 105 mg/dL — ABNORMAL HIGH (ref 0–99)
Triglycerides: 244 mg/dL — ABNORMAL HIGH (ref 0–149)
VLDL Cholesterol Cal: 42 mg/dL — ABNORMAL HIGH (ref 5–40)

## 2024-06-25 ENCOUNTER — Encounter: Payer: Self-pay | Admitting: Internal Medicine

## 2024-06-27 ENCOUNTER — Encounter: Payer: Self-pay | Admitting: Gastroenterology

## 2024-06-27 ENCOUNTER — Encounter: Payer: Self-pay | Admitting: *Deleted

## 2024-06-27 ENCOUNTER — Ambulatory Visit (INDEPENDENT_AMBULATORY_CARE_PROVIDER_SITE_OTHER): Admitting: Gastroenterology

## 2024-06-27 VITALS — BP 126/70 | HR 73 | Temp 98.0°F | Ht 61.0 in | Wt 156.4 lb

## 2024-06-27 DIAGNOSIS — K7460 Unspecified cirrhosis of liver: Secondary | ICD-10-CM | POA: Diagnosis not present

## 2024-06-27 DIAGNOSIS — F1090 Alcohol use, unspecified, uncomplicated: Secondary | ICD-10-CM | POA: Diagnosis not present

## 2024-06-27 DIAGNOSIS — K219 Gastro-esophageal reflux disease without esophagitis: Secondary | ICD-10-CM

## 2024-06-27 NOTE — Progress Notes (Signed)
 "    GI Office Note    Referring Provider: Vicci Barnie NOVAK, MD Primary Care Physician:  Vicci Barnie NOVAK, MD  Primary Gastroenterologist: Ozell Hollingshead, MD   Chief Complaint   Chief Complaint  Patient presents with   Follow-up    Doing well, no issues.    History of Present Illness   Rachel Hudson is a 64 y.o. female presenting today for follow up. Last seen 11/2023. H/O HCV related cirrhosis, s/p HCV eradication and GERD. History of prior remote etoh abuse, quit when she found out she had liver issues, over a decade ago. History of prior Hep B core Total Ab positive with negative Hep B surf Ag/Ab, HBV DNA. She is due hepatoma screening, MELD.   Discussed the use of AI scribe software for clinical note transcription with the patient, who gave verbal consent to proceed.  History of Present Illness Rachel Hudson is a 64 year old female with cirrhosis who presents for routine liver surveillance.  She has no abdominal pain, leg swelling, heartburn, indigestion, or change in bowel habits. Bowel movements are normal.  She tells me that she drinks alcohol only on holidays, totaling about a pint of liquor spread between Thanksgiving and January, and does not drink otherwise.  She is unsure of her hepatitis A and B vaccination history. Prior hepatitis testing showed lack of immunity.    Over the past month she has had decreased appetite and usually eats one to two small meals daily with snacks. She has been intentionally losing weight, down 5 pounds in the past one year. She has no nausea or abdominal pain. Limited protein intake. Mostly eating cereal or sweets.         Prior Data     Results    Labs from May 30, 2024: White blood cell count 6.7, hemoglobin 13.4, platelets 191, creatinine 0.49, albumin 4.6, total bilirubin 0.4, alk phos 66, AST 18, ALT 18,  Labs MELD 3.0: 7 11/2023 US : 11/2023 AFP: 3.0 11/2023 Hep A/B vaccination: HAV Ab reactive. Hep B surf Ab/Ag  neg, Hep B core Total Ab positive EGD:  03/2022, no varices BB: N/A Ascites/peripheral edema: none Diuretics: none Paracentesis: no History of SBP: no Encephalopathy:  no episodes   EGD 03/2022: -normal esophagus -small hh -future screening EGD guided by kPA >20 and/or platelet count less than 150,000   Colonoscopy February 2017: -Internal hemorrhoids -Colonic diverticulosis -Next colonoscopy 2027.   Medications   Current Outpatient Medications  Medication Sig Dispense Refill   amLODipine  (NORVASC ) 10 MG tablet Take 1 tablet by mouth once daily 90 tablet 0   Cholecalciferol (VITAMIN D-3 PO) Take 1 tablet by mouth daily.     colchicine  0.6 MG tablet Take 1 tablet (0.6 mg total) by mouth daily. 30 tablet 4   COLLAGEN PO Take 1 Scoop by mouth in the morning, at noon, in the evening, and at bedtime.     diclofenac  (VOLTAREN ) 75 MG EC tablet Take 1 tablet by mouth once daily 30 tablet 4   gabapentin  (NEURONTIN ) 800 MG tablet TAKE 1 TABLET BY MOUTH EVERY 6 HOURS 120 tablet 2   hydroxychloroquine  (PLAQUENIL ) 200 MG tablet Take 1.5 tablets (300 mg total) by mouth daily. 45 tablet 0   lovastatin  (MEVACOR ) 20 MG tablet Take 1 tablet (20 mg total) by mouth at bedtime. Dose increase 11/09/2023 90 tablet 3   MILK THISTLE PO Take 175 mg by mouth daily.     Multiple Vitamin (MULTIVITAMIN WITH  MINERALS) TABS tablet Take 1 tablet by mouth daily. Centrum Multivitamin     Nutritional Supplements (LIVER DEFENSE PO) Take 2 tablets by mouth in the morning. Liverite Liver Aid     Omega-3 Fatty Acids (FISH OIL PO) Take 1 capsule by mouth in the morning.     pantoprazole  (PROTONIX ) 40 MG tablet TAKE 1 TABLET BY MOUTH ONCE TO TWICE DAILY BEFORE BREAKFAST/MEAL AS NEEDED FOR ACID REFLUX 180 tablet 0   QUEtiapine  (SEROQUEL ) 25 MG tablet Take 1 tablet (25 mg total) by mouth at bedtime. 30 tablet 0   traMADol  (ULTRAM ) 50 MG tablet Take 50 mg by mouth every 6 (six) hours as needed.     No current  facility-administered medications for this visit.    Allergies   Allergies as of 06/27/2024 - Review Complete 06/27/2024  Allergen Reaction Noted   Cymbalta  [duloxetine  hcl] Other (See Comments) 06/25/2024   Penicillins  03/28/2011   Tylenol  [acetaminophen ] Other (See Comments) 07/14/2014     Past Medical History   Past Medical History:  Diagnosis Date   ADD (attention deficit disorder)    ADHD    Anxiety    Arthritis    Arthritis    Back pain    Bilateral carpal tunnel syndrome 09/16/2015   Bipolar 1 disorder (HCC)    Carpal tunnel syndrome 11/18/2014   Cirrhosis of liver (HCC) 01/2014   Stage 4   Diverticulosis of colon without hemorrhage    Dysphagia 08/24/2017   Foot pain, bilateral    GERD (gastroesophageal reflux disease)    Gingivitis 06/15/2016   Gout    Hand pain    Hep C w/o coma, chronic (HCC) As of 10/22/13   S/p treatment with Harvoni , achieved SVR   Hyperlipidemia    Hypertension    Joint pain    Liver problem    Lumbago 11/18/2014   Osteoarthritis    Other fatigue    Peptic ulcer disease    Shortness of breath    Shortness of breath on exertion    Sinus bradycardia 06/15/2016   Vitamin D deficiency     Past Surgical History   Past Surgical History:  Procedure Laterality Date   BACK SURGERY     lumbar fusion   BIOPSY  09/12/2017   Procedure: BIOPSY;  Surgeon: Shaaron Lamar HERO, MD;  [facility]: [facility];  Service: Endoscopy;;  gastric   CARPAL TUNNEL RELEASE Right 11/13/2015   Procedure: RIGHT CARPAL TUNNEL RELEASE;  Surgeon: Franky Curia, MD;  Location: [facility];  Service: Orthopedics;  Laterality: Right;   CARPAL TUNNEL RELEASE Left 01/29/2016   Procedure: left CARPAL TUNNEL RELEASE;  Surgeon: Franky Curia, MD;  Location: [facility];  Service: Orthopedics;  Laterality: Left;   COLONOSCOPY N/A 07/08/2015   Surgeon: Lamar HERO Shaaron, MD; internal hemorrhoids, colonic diverticulosis.  Repeat in 10 years.    ESOPHAGOGASTRODUODENOSCOPY N/A 07/08/2015   Surgeon: Lamar HERO Shaaron, MD; erosive reflux esophagitis, hiatal hernia, gastric erosion and ulcer s/p biopsy (reactive gastropathy, negative H. pylori), no esophageal varices or portal hypertension.   ESOPHAGOGASTRODUODENOSCOPY (EGD) WITH PROPOFOL  N/A 09/12/2017   Surgeon: Shaaron Lamar HERO, MD; normal esophagus s/p dilation, portal hypertensive gastropathy, erosive gastropathy biopsied (negative for H. pylori), normal examined duodenum.   ESOPHAGOGASTRODUODENOSCOPY (EGD) WITH PROPOFOL  N/A 10/25/2019   Surgeon: Shaaron Lamar HERO, MD;  normal esophagus s/p dilation, mild erosive reflux esophagitis, portal hypertensive gastropathy, due for repeat in 2023.   ESOPHAGOGASTRODUODENOSCOPY (EGD) WITH PROPOFOL  N/A 04/14/2022  Procedure: ESOPHAGOGASTRODUODENOSCOPY (EGD) WITH PROPOFOL ;  Surgeon: Shaaron Lamar HERO, MD;  Location: [facility];  Service: Endoscopy;  Laterality: N/A;  11:15 am, pt knows to arrive at 7:30   FINGER ARTHROSCOPY WITH CARPOMETACARPEL [facility]) ARTHROPLASTY Right 04/24/2020   Procedure: RIGHT INDEX FINGER METACARPAL PHALANGEAL ARTHROPLASTY;  Surgeon: Murrell Drivers, MD;  Location: [facility];  Service: Orthopedics;  Laterality: Right;   LIGAMENT REPAIR Right 1980   Knee   MALONEY DILATION N/A 09/12/2017   Procedure: AGAPITO DILATION;  Surgeon: Shaaron Lamar HERO, MD;  Location: [facility];  Service: Endoscopy;  Laterality: N/A;   MALONEY DILATION N/A 10/25/2019   Procedure: AGAPITO DILATION;  Surgeon: Shaaron Lamar HERO, MD;  Location: [facility];  Service: Endoscopy;  Laterality: N/A;   TRIGGER FINGER RELEASE Right 04/24/2020   Procedure: RIGHT LONG TRIGGER FINGER RELEASE TRIGGER FINGER/A-1 PULLEY;  Surgeon: Murrell Drivers, MD;  Location: [facility];  Service: Orthopedics;  Laterality: Right;    Past Family History   Family History  Adopted: Yes  Problem Relation Age of Onset   Colon cancer Neg Hx    Breast cancer  Neg Hx     Past Social History   Social History   Socioeconomic History   Marital status: Single    Spouse name: Not on file   Number of children: Not on file   Years of education: Not on file   Highest education level: Not on file  Occupational History   Occupation: disabled  Tobacco Use   Smoking status: Never    Passive exposure: Past   Smokeless tobacco: Never  Vaping Use   Vaping status: Never Used  Substance and Sexual Activity   Alcohol use: Not Currently    Comment: occ, around holidays   Drug use: Yes    Frequency: 7.0 times per week    Types: Marijuana    Comment: daily for arthritis   Sexual activity: Not Currently    Partners: Male    Birth control/protection: None, Condom  Other Topics Concern   Not on file  Social History Narrative   Not on file   Social Drivers of Health   Tobacco Use: Low Risk (06/27/2024)   Patient History    Smoking Tobacco Use: Never    Smokeless Tobacco Use: Never    Passive Exposure: Past  Financial Resource Strain: High Risk (04/08/2023)   Overall Financial Resource Strain (CARDIA)    Difficulty of Paying Living Expenses: Very hard  Food Insecurity: Food Insecurity Present (04/08/2023)   Hunger Vital Sign    Worried About Running Out of Food in the Last Year: Sometimes true    Ran Out of Food in the Last Year: Often true  Transportation Needs: No Transportation Needs (04/08/2023)   PRAPARE - Administrator, Civil Service (Medical): No    Lack of Transportation (Non-Medical): No  Physical Activity: Sufficiently Active (04/08/2023)   Exercise Vital Sign    Days of Exercise per Week: 7 days    Minutes of Exercise per Session: 30 min  Stress: No Stress Concern Present (04/08/2023)   [employer] of Occupational Health - Occupational Stress Questionnaire    Feeling of Stress : Not at all  Social Connections: Socially Isolated (04/08/2023)   Social Connection and Isolation Panel    Frequency of Communication  with Friends and Family: Never    Frequency of Social Gatherings with Friends and Family: Never    Attends Religious Services: More than 4  times per year    Active Member of Clubs or Organizations: No    Attends Banker Meetings: Never    Marital Status: Never married  Intimate Partner Violence: Not At Risk (04/08/2023)   Humiliation, Afraid, Rape, and Kick questionnaire    Fear of Current or Ex-Partner: No    Emotionally Abused: No    Physically Abused: No    Sexually Abused: No  Depression (PHQ2-9): Medium Risk (03/12/2024)   Depression (PHQ2-9)    PHQ-2 Score: 5  Alcohol Screen: Low Risk (04/08/2023)   Alcohol Screen    Last Alcohol Screening Score (AUDIT): 0  Housing: Low Risk (04/08/2023)   Housing    Last Housing Risk Score: 0  Utilities: Not At Risk (04/08/2023)   AHC Utilities    Threatened with loss of utilities: No  Health Literacy: Inadequate Health Literacy (04/08/2023)   B1300 Health Literacy    Frequency of need for help with medical instructions: Sometimes    Review of Systems   General: Negative for anorexia, weight loss, fever, chills, fatigue, weakness. ENT: Negative for hoarseness, difficulty swallowing , nasal congestion. CV: Negative for chest pain, angina, palpitations, dyspnea on exertion, peripheral edema.  Respiratory: Negative for dyspnea at rest, dyspnea on exertion, cough, sputum, wheezing.  GI: See history of present illness. GU:  Negative for dysuria, hematuria, urinary incontinence, urinary frequency, nocturnal urination.  Endo: Negative for unusual weight change.     Physical Exam   BP 126/70   Pulse 73   Temp 98 F (36.7 C) (Oral)   Ht 5' 1 (1.549 m)   Wt 156 lb 6.4 oz (70.9 kg)   LMP 09/29/2016   SpO2 96%   BMI 29.55 kg/m    General: Well-nourished, well-developed in no acute distress.  Eyes: No icterus. Mouth: Oropharyngeal mucosa moist and pink   Lungs: Clear to auscultation bilaterally.  Heart: Regular rate and  rhythm, no murmurs rubs or gallops.  Abdomen: Bowel sounds are normal, nontender, nondistended, no hepatosplenomegaly or masses,  no abdominal bruits or hernia , no rebound or guarding.  Rectal: not performed Extremities: No lower extremity edema. No clubbing or deformities. Neuro: Alert and oriented x 4   Skin: Warm and dry, no jaundice.   Psych: Alert and cooperative, normal mood and affect.  Labs   See hpi  Imaging Studies   No results found.  Assessment/Plan:     Assessment & Plan Cirrhosis, well compensated:  Chronic HCV-related cirrhosis with history of heavy etoh use in the past but with ongoing moderate alcohol use around holidays, increasing risk of hepatic injury and affecting transplant eligibility. - Counseled on strict alcohol abstinence and implications for hepatic health and transplant candidacy. - Advised to report new symptoms such as abdominal pain, swelling, nausea, unintentional weight loss - Due for MELD, and hepatoma surveillance - Recommend Hepatitis B vaccination. Provided counseling on hepatitis B vaccination, including rationale and schedule - encourage high protein diet mostly plant based, exercise, avoid red meat, no raw or undercooked meat/seafood/shellfish, low fat/low carb diet, 2 g sodium diet. Handout provided.  - Return ov in six months  Gastroesophageal reflux disease Chronic GERD, well-controlled on pantoprazole  with no active symptoms.        Sonny RAMAN. Ezzard, MHS, PA-C [facility]  "

## 2024-06-27 NOTE — Patient Instructions (Addendum)
 Please complete your labs at any [facility].   We will set up your ultrasound to be done at [facility].  You should consider vaccination for Hepatitis B. This is a 2 or 3 shot series depending on which vaccine you get so be sure to complete the series. You can have done at any pharmacy or possibly through your PCP.   You are immune to Hepatitis A, I misspoke previously.  Return office visit in six months.   Nutrition recommendations for cirrhosis:  High-protein diet from a primarily plant-based diet. Avoid red meat.  No raw or undercooked meat, seafood, or shellfish. Low-fat/cholesterol/carbohydrate diet. Limit sodium to no more than 2000 mg/day including everything that you eat and drink. Recommend at least 30 minutes of aerobic and resistance exercise 3 days/week. NO ALCOHOL.

## 2024-07-05 ENCOUNTER — Ambulatory Visit (HOSPITAL_COMMUNITY)

## 2024-07-20 ENCOUNTER — Ambulatory Visit

## 2024-07-24 ENCOUNTER — Ambulatory Visit: Payer: Self-pay | Admitting: Internal Medicine
